# Patient Record
Sex: Female | Born: 1945 | Hispanic: No | Marital: Single | Smoking: Former smoker
Health system: Southern US, Community
[De-identification: ages and names within clinical notes are randomized; demographics above are authoritative.]

## PROBLEM LIST (undated history)

## (undated) DIAGNOSIS — I480 Paroxysmal atrial fibrillation: Secondary | ICD-10-CM

## (undated) DIAGNOSIS — T7840XA Allergy, unspecified, initial encounter: Secondary | ICD-10-CM

## (undated) DIAGNOSIS — I495 Sick sinus syndrome: Secondary | ICD-10-CM

## (undated) DIAGNOSIS — E119 Type 2 diabetes mellitus without complications: Secondary | ICD-10-CM

## (undated) DIAGNOSIS — R001 Bradycardia, unspecified: Secondary | ICD-10-CM

## (undated) HISTORY — DX: Type 2 diabetes mellitus without complications: E11.9

## (undated) HISTORY — DX: Allergy, unspecified, initial encounter: T78.40XA

## (undated) HISTORY — DX: Bradycardia, unspecified: R00.1

## (undated) HISTORY — DX: Paroxysmal atrial fibrillation: I48.0

## (undated) HISTORY — DX: Sick sinus syndrome: I49.5

---

## 2002-11-21 ENCOUNTER — Encounter: Payer: Self-pay | Admitting: Emergency Medicine

## 2002-11-21 ENCOUNTER — Emergency Department (HOSPITAL_COMMUNITY): Admission: EM | Admit: 2002-11-21 | Discharge: 2002-11-22 | Payer: Self-pay | Admitting: Emergency Medicine

## 2013-12-07 ENCOUNTER — Ambulatory Visit: Payer: Medicare Other

## 2013-12-07 ENCOUNTER — Ambulatory Visit (INDEPENDENT_AMBULATORY_CARE_PROVIDER_SITE_OTHER): Payer: Medicare Other | Admitting: Family Medicine

## 2013-12-07 VITALS — BP 122/64 | HR 66 | Temp 98.6°F | Resp 18 | Ht <= 58 in | Wt 116.0 lb

## 2013-12-07 DIAGNOSIS — R079 Chest pain, unspecified: Secondary | ICD-10-CM

## 2013-12-07 DIAGNOSIS — K219 Gastro-esophageal reflux disease without esophagitis: Secondary | ICD-10-CM

## 2013-12-07 DIAGNOSIS — R000 Tachycardia, unspecified: Secondary | ICD-10-CM | POA: Diagnosis not present

## 2013-12-07 LAB — COMPREHENSIVE METABOLIC PANEL
ALT: 19 U/L (ref 0–35)
AST: 20 U/L (ref 0–37)
Albumin: 4.5 g/dL (ref 3.5–5.2)
Alkaline Phosphatase: 99 U/L (ref 39–117)
BUN: 15 mg/dL (ref 6–23)
CO2: 26 mEq/L (ref 19–32)
Calcium: 9.9 mg/dL (ref 8.4–10.5)
Chloride: 102 mEq/L (ref 96–112)
Creat: 0.62 mg/dL (ref 0.50–1.10)
Glucose, Bld: 103 mg/dL — ABNORMAL HIGH (ref 70–99)
Potassium: 4.2 mEq/L (ref 3.5–5.3)
Sodium: 139 mEq/L (ref 135–145)
Total Bilirubin: 0.5 mg/dL (ref 0.3–1.2)
Total Protein: 7.6 g/dL (ref 6.0–8.3)

## 2013-12-07 LAB — POCT UA - MICROSCOPIC ONLY
Casts, Ur, LPF, POC: NEGATIVE
Crystals, Ur, HPF, POC: NEGATIVE
Mucus, UA: NEGATIVE
Yeast, UA: NEGATIVE

## 2013-12-07 LAB — POCT URINALYSIS DIPSTICK
Bilirubin, UA: NEGATIVE
Glucose, UA: NEGATIVE
Ketones, UA: NEGATIVE
Nitrite, UA: NEGATIVE
Protein, UA: 30
Spec Grav, UA: 1.01
Urobilinogen, UA: 0.2
pH, UA: 7

## 2013-12-07 LAB — POCT CBC
Granulocyte percent: 58.6 %G (ref 37–80)
HCT, POC: 43.7 % (ref 37.7–47.9)
Hemoglobin: 13.5 g/dL (ref 12.2–16.2)
Lymph, poc: 3.9 — AB (ref 0.6–3.4)
MCH, POC: 31 pg (ref 27–31.2)
MCHC: 30.9 g/dL — AB (ref 31.8–35.4)
MCV: 100.4 fL — AB (ref 80–97)
MID (cbc): 0.9 (ref 0–0.9)
MPV: 8.9 fL (ref 0–99.8)
POC Granulocyte: 6.8 (ref 2–6.9)
POC LYMPH PERCENT: 33.5 %L (ref 10–50)
POC MID %: 7.9 %M (ref 0–12)
Platelet Count, POC: 322 10*3/uL (ref 142–424)
RBC: 4.35 M/uL (ref 4.04–5.48)
RDW, POC: 13.6 %
WBC: 11.6 10*3/uL — AB (ref 4.6–10.2)

## 2013-12-07 LAB — GLUCOSE, POCT (MANUAL RESULT ENTRY): POC Glucose: 91 mg/dl (ref 70–99)

## 2013-12-07 LAB — TSH: TSH: 0.892 u[IU]/mL (ref 0.350–4.500)

## 2013-12-07 MED ORDER — OMEPRAZOLE 40 MG PO CPDR: 40.0000 mg | DELAYED_RELEASE_CAPSULE | Freq: Every day | ORAL | Status: DC

## 2013-12-07 MED ORDER — ALBUTEROL SULFATE HFA 108 (90 BASE) MCG/ACT IN AERS: 2.0000 | INHALATION_SPRAY | RESPIRATORY_TRACT | Status: DC | PRN

## 2013-12-07 MED ORDER — IPRATROPIUM BROMIDE 0.03 % NA SOLN: 2.0000 | Freq: Four times a day (QID) | NASAL | Status: DC

## 2013-12-07 MED ORDER — AZITHROMYCIN 250 MG PO TABS: ORAL_TABLET | ORAL | Status: DC

## 2013-12-07 MED ORDER — FLUTICASONE PROPIONATE 50 MCG/ACT NA SUSP: 2.0000 | Freq: Every day | NASAL | Status: DC

## 2013-12-07 NOTE — Progress Notes (Addendum)
Subjective:   Patient ID: Courtney [name], female    DOB: September 03, 1946, 67 y.o.   MRN: [id number]  HPI  This chart was scribed for Sherren Mocha, MD, by Ellin Mayhew, ED Scribe. This patient was seen in room 4 and the patient's care was started at 12:30 PM.  Chief Complaint  Patient presents with  . rapid heart beat    x3 weeks   . Fatigue    HPI Comments: Courtney [name] is a 67 y.o. female who presents to the [facility] complaining of intermittent heart palpitations that has been ongoing for three weeks. Her son states that the patient has been feeling associated chest pain which radiates to the back. The patient states the pain usually occurs when she lays down at night and usually lasts approximately 10-30 minutes where she feels her HR alternating rhythms. Additionally, she states experiencing associated hot flashes and chills with her cardiac symptoms. She denies any pain traveling to her abdominal region or experiencing nausea, vomiting, or diarrhea; however, the patient has had an appetite change and has had trouble eating normal amounts of food since the CP began. During the day, she feels fatigued and a general feeling of illness. She has been urinating more frequently, and has experienced indigestion, but has otherwise not experienced any abnormal GU or GI symptoms. The patient does not actively exercise and states she feels SOB when walking vigorously or walking up a flight of stairs. Upon having palpitations, the patient states feeling tightness in her chest, which makes it difficult for her to breathe.She states this period of SOB typically lasts 10-15 minutes and occurs with the same onset as the CP. She has not had cardiac medical problems in the past. She currently takes Advil for the CP and back pain with minimal relief. Patient confirms she is an every day smoker.  Past Medical History  Diagnosis Date  . Allergy     History reviewed. No pertinent past surgical  history.  History reviewed. No pertinent family history.  History   Social History  . Marital Status: Single    Spouse Name: N/A    Number of Children: N/A  . Years of Education: N/A   Occupational History  . Not on file.   Social History Main Topics  . Smoking status: Current Every Day Smoker  . Smokeless tobacco: Not on file  . Alcohol Use: Not on file  . Drug Use: Not on file  . Sexual Activity: Not on file   Other Topics Concern  . Not on file   Social History Narrative  . No narrative on file   No Known Allergies  No current outpatient prescriptions on file prior to visit.   No current facility-administered medications on file prior to visit.    Review of Systems  Constitutional: Positive for chills, diaphoresis, appetite change and fatigue. Negative for fever.  HENT: Negative for congestion, ear pain and sore throat.   Respiratory: Positive for shortness of breath. Negative for cough and chest tightness.   Cardiovascular: Positive for chest pain and palpitations.  Gastrointestinal: Negative for nausea, vomiting, abdominal pain and diarrhea.       Dyspepsia  Endocrine: Positive for polyuria.  Genitourinary: Negative for dysuria and hematuria.  Musculoskeletal: Positive for back pain.  Skin: Negative.   Neurological: Negative for dizziness, weakness and headaches.   BP 122/64  Pulse 66  Temp(Src) 98.6 F (37 C) (Oral)  Resp 18  Ht 4' 5.5" (  1.359 m)  Wt 116 lb (52.617 kg)  BMI 28.49 kg/m2  SpO2 98% Objective:  Physical Exam  Nursing note and vitals reviewed. Constitutional: She is oriented to person, place, and time. She appears well-developed and well-nourished. No distress.  HENT:  Head: Normocephalic and atraumatic.  Eyes: Conjunctivae and EOM are normal.  Neck: Normal range of motion. Neck supple.  Cardiovascular: Normal rate, regular rhythm and normal heart sounds.   No murmur heard. Pulmonary/Chest: Effort normal and breath sounds normal. No  respiratory distress. She has no wheezes.  Musculoskeletal: Normal range of motion.  Neurological: She is alert and oriented to person, place, and time.  Skin: Skin is warm and dry.  Psychiatric: She has a normal mood and affect. Her behavior is normal.   Results for orders placed in visit on 12/07/13  POCT CBC      Result Value Range   WBC 11.6 (*) 4.6 - 10.2 K/uL   Lymph, poc 3.9 (*) 0.6 - 3.4   POC LYMPH PERCENT 33.5  10 - 50 %L   MID (cbc) 0.9  0 - 0.9   POC MID % 7.9  0 - 12 %M   POC Granulocyte 6.8  2 - 6.9   Granulocyte percent 58.6  37 - 80 %G   RBC 4.35  4.04 - 5.48 M/uL   Hemoglobin 13.5  12.2 - 16.2 g/dL   HCT, POC 16.1  09.6 - 47.9 %   MCV 100.4 (*) 80 - 97 fL   MCH, POC 31.0  27 - 31.2 pg   MCHC 30.9 (*) 31.8 - 35.4 g/dL   RDW, POC 04.5     Platelet Count, POC 322  142 - 424 K/uL   MPV 8.9  0 - 99.8 fL  GLUCOSE, POCT (MANUAL RESULT ENTRY)      Result Value Range   POC Glucose 91  70 - 99 mg/dl  POCT URINALYSIS DIPSTICK      Result Value Range   Color, UA yellow     Clarity, UA clear     Glucose, UA neg     Bilirubin, UA neg     Ketones, UA neg     Spec Grav, UA 1.010     Blood, UA trace-lysed     pH, UA 7.0     Protein, UA 30 mg     Urobilinogen, UA 0.2     Nitrite, UA neg     Leukocytes, UA small (1+)    POCT UA - MICROSCOPIC ONLY      Result Value Range   WBC, Ur, HPF, POC 0-5     RBC, urine, microscopic 0-2     Bacteria, U Microscopic trace     Mucus, UA neg     Epithelial cells, urine per micros 1-4     Crystals, Ur, HPF, POC neg     Casts, Ur, LPF, POC neg     Yeast, UA neg     [facility] reading (PRIMARY) by  Dr. Clelia Croft. CXR: increased in bibasilar hilar infiltrates and haziness. CLINICAL DATA: Fatigue, rapid heart rate  EXAM: CHEST 2 VIEW  COMPARISON: None.  FINDINGS: Normal cardiac silhouette and mediastinal contours given slightly reduced lung volumes. Bilateral mid and lower lung heterogeneous slightly nodular opacities. No definite  pleural effusion or pneumothorax. No definite evidence of edema. No acute osseus abnormalities.  IMPRESSION: Bilateral mid and lower lung slightly nodular heterogeneous opacities which in the absence of prior examinations are age indeterminate, though acute on  chronic process (MAC infection) is not excluded. A follow-up chest radiograph in 4 to 6 weeks after treatment is recommended to ensure resolution.  EKG: NSR, no acute ischemic changes, unchanged while pt was symptomatic Assessment & Plan:  12:38 PM-  Rapid heart beat - Plan: EKG 12-Lead, POCT CBC, POCT glucose (manual entry), POCT urinalysis dipstick, POCT UA - Microscopic Only, TSH, Comprehensive metabolic panel, DG Chest 2 View  Chest pain - Plan: Ambulatory referral to Cardiology - unsure of etiology - has cardiac risk factors of no prior medical care and smoking but could be gerd or acute pulmonary infection w/ abnml CXR and mild leukocytosis - start zpack and recheck in 1 wk  GERD (gastroesophageal reflux disease) - Plan: H. pylori antibody, IgG - gave diet info in vietnamese. Start ppi.  Meds ordered this encounter  Medications  . Ibuprofen (ADVIL) 200 MG CAPS    Sig: Take by mouth.  Marland Kitchen omeprazole (PRILOSEC) 40 MG capsule    Sig: Take 1 capsule (40 mg total) by mouth daily.    Dispense:  30 capsule    Refill:  3  . azithromycin (ZITHROMAX) 250 MG tablet    Sig: Take 2 tabs PO x 1 dose, then 1 tab PO QD x 4 days    Dispense:  6 tablet    Refill:  0  . fluticasone (FLONASE) 50 MCG/ACT nasal spray    Sig: Place 2 sprays into both nostrils at bedtime.    Dispense:  16 g    Refill:  2  . ipratropium (ATROVENT) 0.03 % nasal spray    Sig: Place 2 sprays into the nose 4 (four) times daily.    Dispense:  30 mL    Refill:  1  . albuterol (PROVENTIL HFA;VENTOLIN HFA) 108 (90 BASE) MCG/ACT inhaler    Sig: Inhale 2 puffs into the lungs every 4 (four) hours as needed for wheezing or shortness of breath (cough, shortness of  breath or wheezing.).    Dispense:  1 Inhaler    Refill:  1    I personally performed the services described in this documentation, which was scribed in my presence. The recorded information has been reviewed and considered, and addended by me as needed.  Norberto Sorenson, MD MPH

## 2013-12-07 NOTE — Patient Instructions (Signed)
B?nh Tro Ng??c D? Dy Th?c Qu?n, Ng??i L?n (Gastroesophageal Reflux Diseaes, Adult) B?nh tro ng??c d? dy th?c qu?n (GERD) x?y ra khi axit t? d? dy tro ln th?c qu?n. Khi axit ti?p xc v?i th?c qu?n, axit gy ra ?au (vim) trong th?c qu?n. Theo th?i gian, GERD c th? t?o ra cc l? nh? (cc v?t lot) ? nim m?c th?c qu?n.  NGUYN NHN  Tr?ng l??ng c? th? t?ng. ?i?u ny t?o p l?c ln d? dy, lm t?ng axit t? d? dy vo th?c qu?n.  Ht thu?c l. Ht thu?c l lm t?ng s?n sinh axit trong d? dy.  U?ng r??u. ?y l nguyn nhn lm gi?m p l?c trong c? th?t th?c qu?n d??i (van ho?c vng c? gi?a th?c qu?n v d? dy), cho php axit t? d? dy vo th?c qu?n.  ?n t?i mu?n v b?ng no. Tnh tr?ng ny lm t?ng p l?c c?ng nh? t?ng s?n sinh axit trong d? dy.  D? t?t c? th?t th?c qu?n d??i. ?i khi khng tm th?y nguyn nhn. TRI?U CH?NG  ?au rt ? ph?n d??i gi?a ng?c pha sau x??ng ?c v ? khu v?c gi?a d? dy. Hi?n t??ng ny c th? x?y ra hai l?n m?t tu?n ho?c th??ng xuyn h?n.  Kh nu?t.  ?au h?ng.  Ho khan.  Cc tri?u ch?ng gi?ng hen suy?n, bao g?m t?c ng?c,kh th? ho?c th? kh kh. CH?N ?ON Chuyn gia ch?m sc s?c kh?e c th? ch?n ?on GERD d?a trn cc tri?u ch?ng c?a b?n. Trong m?t s? tr??ng h?p, ch?p X quang v cc xt nghi?m khc c th? ???c ti?n hnh ?? ki?m tra cc bi?n ch?ng ho?c tnh tr?ng c?a d? dy v th?c qu?n. ?I?U TR? Chuyn gia ch?m sc s?c kh?e c th? khuy?n ngh? dng thu?c khng c?n k ??n ho?c thu?c c?n k ??n ?? gip gi?m s?n sinh axit. Hy h?i chuyn gia ch?m sc s?c kh?e c?a b?n tr??c khi b?t ??u ho?c dng thm b?t k? lo?i thu?c m?i no. H??NG D?N CH?M SC T?I NH  Thay ??i cc y?u t? m b?n c th? ki?m sot ???c. H?i chuyn gia ch?m sc s?c kh?e ?? ???c h??ng d?n v? vi?c gi?m cn, b? thu?c l v s? d?ng r??u.  Trnh cc lo?i th?c ph?m v ?? u?ng lm cho cc tri?u ch?ng t?i t? h?n, ch?ng h?n nh?:  ?? u?ng c caffeine ho?c r??u.  S c la.  B?c h ho?c v? b?c  h.  T?i v hnh ty.  Th?c ?n cay.  Tri cy h? cam, ch?ng h?n nh? cam, chanh hay chanh ty.  Cc th?c ?n c c chua, ch?ng h?n nh? n??c x?t, ?t, salsa (n??c x?t cay) v bnh pizza.  Cc lo?i th?c ?n chin xo v nhi?u ch?t bo.  Trnh n?m xu?ng ng? 3 ti?ng tr??c gi? ?i ng? ho?c tr??c khi c m?t gi?c ng? ng?n.  ?n nh?ng b?a ?n nh?, th??ng xuyn h?n thay v cc b?a ?n l?n.  M?c qu?n o r?ng. Khng ?eo b?t c? th? g ch?t quanh th?t l?ng gy p l?c ln d? dy.  Nng ??u gi??ng cao ln t? 6 ??n 8 inch b?ng cc kh?i g? ?? gip b?n ng?. S? d?ng thm g?i s? khng c tc d?ng.  Ch? s? d?ng thu?c khng c?n k ??n ho?c thu?c c?n k ??n ?? gi?m ?au, gi?m c?m gic kh ch?u ho?c h? s?t theo ch? d?n c?a chuyn gia ch?m sc s?c kh?e c?a b?n.    Khng dng thu?c atpirin, ibuprofen ho?c cc thu?c ch?ng vim khng c steroid (NSAID) khc. HY NGAY L?P T?C ?I KHM N?U:  B?n b? ?au ? cnh tay, c?, hm, r?ng ho?c l?ng.  Hi?n t??ng ?au t?ng ln ho?c thay ??i theo c??ng ?? ho?c th?i gian.  B?n b? bu?n nn, nn ho?c ?? m? hi (tot m? hi).  B?n b? kh th? ho?c ng?t x?u.  Ch?t nn c mu xanh l cy, vng, ?en ho?c trng gi?ng nh? b c ph ho?c mu.  Phn c mu ??, ?? nh? mu ho?c ?en. Nh?ng tri?u ch?ng ny c th? l d?u hi?u c?a cc v?n ?? khc, ch?ng h?n nh? b?nh tim, ch?y mu d? dy ho?c ch?y mu th?c qu?n. ??M B?O B?N:  Hi?u cc h??ng d?n ny.  S? theo di tnh tr?ng c?a mnh.  S? yu c?u tr? gip ngay l?p t?c n?u b?n c?m th?y khng ?? ho?c tnh tr?ng tr?m tr?ng h?n. Document Released: 09/08/2005 Document Revised: 08/01/2013 [facility] Patient Information 2014 [place], [place]. Cai Thu?c L (Smoking Cessation) B? ht thu?c c  ngh?a quan tr?ng ??i v?i s?c kh?e c?a b?n v c nhi?u l?i ch. Tuy nhin, khng ph?i lun d? dng ?? b? v nicotine l m?t lo?i ch?t gy nghi?n. Thng th??ng, m?i ng??i c? g?ng t? 3 l?n tr? ln tr??c khi c th? b? thu?c l. Ti li?u ny gi?i thch nh?ng cch t?t nh?t  ?? b?n c th? chu?n b? b? ht thu?c. B? thu?c l l vi?c lm kh kh?n v c?n r?t nhi?u n? l?c, nh?ng b?n c th? lm ?i?u ?. ?U ?I?M C?A B? HT THU?C  B?n s? s?ng lu h?n, c?m th?y kh?e h?n v s?ng t?t h?n.  C? th? c?a b?n s? c?m nh?n ???c tc ??ng c?a vi?c b? thu?c g?n nh? ngay l?p t?c.  Trong vng 20 pht, huy?t p s? gi?m. M?ch ??p tr? l?i m?c bnh th??ng.  Sau 8 gi?, n?ng ?? cacbon monoxit trong mu s? tr? v? bnh th??ng. N?ng ?? oxy c?a b?n t?ng.  Sau 24 gi?, nguy c? b? ?au tim b?t ??u gi?m. H?i th?, tc v c? th? b?n s? h?t mi khi thu?c.  Sau 48 gi?, cc ??u dy th?n kinh b? t?n th??ng b?t ??u h?i ph?c. Kh??u gic v v? gic s? c?i [place]?n.  Sau 72 gi?, c? th? h?u nh? khng cn nicotine. ?ng ph? qu?n s? gin ra v th? d? h?n.  Sau 2 ??n 12 tu?n, ph?i c th? gi? khng kh nhi?u h?n. T?p th? d?c tr? nn d? dng h?n v c?i [place]?n tu?n hon.  Nguy c? b? ?au tim, ??t qu?, ung th? ho?c b?nh ph?i gi?m ?ng k?.  Sau 1 n?m, nguy c? b? b?nh tim m?ch vnh s? gi?m m?t n?a.  Sau 5 n?m, nguy c? ??t qu? gi?m xu?ng gi?ng nh? m?t khng ht thu?c.  Sau 10 n?m, nguy c? ung th? ph?i gi?m ?i m?t n?a v nguy c? b? b?nh ung th? khc s? gi?m ?ng k?.  Sau 15 n?m, nguy c? c?a b?nh tim m?ch vnh gi?m, th??ng l b?ng m?c ?? c?a m?t ng??i khng ht thu?c.  N?u b?n ?ang mang thai, b? ht thu?c s? c?i [place]?n c? h?i c m?t em b kh?e m?nh.  Nh?ng ng??i s?ng chung v?i b?n, ??c bi?t l m?i tr? em, s? kh?e m?nh h?n.  B?n s? c thm ti?n ?? chi tiu [place] nh?ng th? khc ngoi thu?c l. CC CU H?I  C?N SUY NGH? TR??C KHI TM CCH B? THU?C L B?n c th? mu?n ni v? cc cu tr? l?i c?a b?n v?i chuyn gia ch?m [place] s?c kh?e.  T?i sao b?n mu?n b? thu?c l?  N?u b?n ? c? g?ng b? thu?c l trong qu kh?, nh?ng g ? gip ch b?n v nh?ng g khng gip ch cho b?n?  ?u l nh?ng tnh hu?ng kh kh?n nh?t ??i v?i b?n sau khi b? thu?c l? B?n ??nh x? l chng nh? th? no?  Ai c th? gip b?n v??t qua nh?ng th?i ?i?m  kh kh?n? Gia ?nh? B?n b? Chuyn gia ch?m [place] s?c kh?e?  B?n vui thch g khi ht thu?c l? B?n v?n c ni?m vui thch no n?u b? thu?c l? D??i ?y l m?t s? cu h?i ?? h?i chuyn gia ch?m [place] s?c kh?e:  Lm th? no b?n c th? gip ti ?? b? thu?c l thnh cng?  Theo b?n, lo?i d??c ph?m no s? l t?t nh?t cho ti v ti nn dng n nh? th? no?  Ti c?n lm g n?u ti c?n thm tr? gip?  Cai thu?c s? c c?m gic nh? th? no? Lm th? no ti c th? nh?n ???c thng tin v? cai thu?c? CHU?N B? S?N SNG  Ch?n ngy b? thu?c l.  Thay ??i mi tr??ng c?a b?n b?ng cch v?t b? t?t c? thu?c l, g?t tn, dim v b?t l?a trong nh, trn xe ho?c n?i lm vi?c. Khng cho php m?i ng??i ht thu?c trong nh b?n.  Xem l?i n? l?c b? thu?c l tr??c ?y c?a b?n. Hy suy ngh? v? nh?ng g hi?u qu? v nh?ng g khng. NH?N H? TR? V KHUY?N [facility] B?n c nhi?u kh? n?ng thnh cng h?n n?u c s? gip ??. B?n c th? nh?n ???c h? tr? b?ng nhi?u cch.  Ni v?i gia ?nh, b?n b v ??ng nghi?p r?ng b?n s? b? thu?c l v c?n s? h? tr? c?a h?. Yu c?u h? khng ht thu?c xung quanh b?n.  Nh?n t? v?n v h? tr? c nhn, nhm ho?c qua ?i?n tho?i. Cc ch??ng trnh ???c cung c?p t?i cc b?nh vi?n v trung tm y t? ??a ph??ng. Hy g?i cho s? y t? ??a ph??ng ?? bi?t thng tin v? cc ch??ng trnh trong khu v?c c?a b?n.  Ni?m tin v th?c hnh tm linh c th? gip m?t s? ng??i ht thu?c b? thu?c l.  T?i v? "quit meter" (??ng h? b? thu?c l) trn my tnh c?a b?n ?? theo di s? li?u th?ng k b? thu?c l, ch?ng h?n nh? b?n ? khng ht thu?c ???c bao lu, s? l??ng thu?c khng ht v ti?n ti?t ki?m ???c.  Nh?n m?t cu?n sch t? gip ?? v? b? ht thu?c v trnh xa thu?c l. H?C CC K? N?NG V HNH VI M?I  T? ?nh l?c h??ng kh?i nhu c?u ht thu?c. Ni chuy?n v?i m?t ai ?, ?i b? ho?c gi?t th?i gian b?ng cng vi?c.  Thay ??i [place] quen bnh th??ng c?a b?n. Thay ??i l? trnh ??n n?i lm vi?c. U?ng tr thay cho c ph. ?n sng t?i m?t  n?i khc.  Gi?m b?t c?ng th?ng. T?m n??c nng, t?p th? d?c ho?c ??c sch.  D? ??nh lm m?t vi?c g ? th v? m?i ngy. T? th??ng cho mnh do khng ht thu?c.  Khm ph cc ch??ng trnh t??ng tc d?a trn web chuyn gip b?n b? thu?c l. MUA  D??C PH?M V S? D?NG D??C PH?M H?P L D??c ph?m c th? gip b?n ng?ng ht thu?c v gi?m c?n thm thu?c l. K?t h?p d??c ph?m v?i cc ph??ng php hnh vi v h? tr? ? trn c th? t?ng ?ng k? kh? n?ng b? thu?c l thnh cng c?a b?n.  Li?u php thay th? nicotine gip cung c?p nicotine cho c? th? c?a b?n m khng c?n tc ??ng tiu c?c v r?i ro c?a vi?c ht thu?c. Li?u php thay th? nicotine bao g?m k?o, vin ng?m, thu?c ht, thu?c x?t m?i v mi?ng dn da ch?a nicotine. M?t s? khng c?n k toa, m?t s? khc c?n c toa c?a bc s?.  Thu?c ch?ng tr?m c?m gip nh?ng ng??i b? ht thu?c, nh?ng khng r n ho?t ??ng nh? th? no. D??c ph?m ny c s?n theo toa.  Thu?c c? ch? v?n m?t ph?n th? th? nicotinic m ph?ng hi?u ?ng c?a nicotin trong no c?a b?n. D??c ph?m ny c s?n theo toa. Hy h?i chuyn gia ch?m [place] s?c kh?e ?? ???c t? v?n v? nh?ng lo?i thu?c ?? s? d?ng v cch s? d?ng chng d?a [place] b?nh s? c?a b?n. Chuyn gia ch?m [place] s?c kh?e s? cho b?n bi?t c?n ??  nh?ng tc d?ng ph? no n?u b?n ch?n s? d?ng lo?i d??c ph?m ho?c li?u php. ??c k? thng tin trn bao b. Khng s? d?ng b?t k? s?n ph?m no khc c ch?a nicotine trong khi s? d?ng m?t s?n ph?m thay th? nicotine. TI PHT HO?C TNH HU?NG KH KH?N H?u h?t ti pht x?y ra trong vng 3 thng ??u tin sau khi b? thu?c. Khng n?n lng khi b?t ??u ht thu?c tr? l?i. Hy nh? r?ng, h?u h?t m?i ng??i c? g?ng nhi?u l?n tr??c b? h?n thu?c. B?n c th? c cc tri?u ch?ng t? b? v c? th? c?a b?n ? quen v?i nicotine. B?n c th? thm thu?c l, b? kch thch, c?m th?y r?t ?i, ho th??ng xuyn, b? ?au ??u, ho?c kh t?p trung. Cc tri?u ch?ng t? b? ch? l t?m th?i. Chng m?nh nh?t khi b?n b?t ??u b? thu?c l, nh?ng chng s? bi?n m?t  trong vng 10-14 ngy. ?? gi?m nguy c? ti pht, c? g?ng:  Trnh u?ng r??u. U?ng r??u lm gi?m c? h?i b? thu?c thnh cng c?a b?n.  Gi?m l??ng caffeine b?n tiu th?. M?t khi b?n b? thu?c l, l??ng caffeine trong c? th? c?a b?n t?ng v c th? t?o cho b?n cc tri?u ch?ng nh? tim ??p nhanh, ?? m? hi v lo l?ng.  Trnh nh?ng ng??i ht thu?c v h? c th? khi?n b?n mu?n ht thu?c.  ??ng ?? vi?c t?ng cn lm b?n m?t t?p trung. Nhi?u ng??i ht thu?c s? t?ng cn khi b? thu?c, th??ng t h?n 10 pao (4,5 kg). ?n m?t ch? ?? ?n u?ng lnh m?nh v duy tr ho?t ??ng. B?n lun c th? gi?m s? cn ? t?ng sau khi b? thu?c l.  Tm cch ?? c?i [place]?n tm tr?ng c?a b?n ngoi vi?c ht thu?c. ?? BI?T THM THNG TIN [email or web address] Document Released: 03/16/2007 Document Revised: 08/01/2013 [facility] Patient Information 2014 [place], [place].

## 2013-12-10 LAB — H. PYLORI ANTIBODY, IGG: H Pylori IgG: 1.91 {ISR} — ABNORMAL HIGH

## 2013-12-14 ENCOUNTER — Encounter: Payer: Self-pay | Admitting: Family Medicine

## 2013-12-14 MED ORDER — AMOXICILL-CLARITHRO-LANSOPRAZ PO MISC: ORAL | Status: DC

## 2015-05-05 ENCOUNTER — Ambulatory Visit (INDEPENDENT_AMBULATORY_CARE_PROVIDER_SITE_OTHER): Payer: Medicare Other | Admitting: Family Medicine

## 2015-05-05 ENCOUNTER — Ambulatory Visit (INDEPENDENT_AMBULATORY_CARE_PROVIDER_SITE_OTHER): Payer: Medicare Other

## 2015-05-05 VITALS — BP 112/38 | HR 57 | Temp 98.3°F | Resp 18 | Ht <= 58 in | Wt 117.2 lb

## 2015-05-05 DIAGNOSIS — R938 Abnormal findings on diagnostic imaging of other specified body structures: Secondary | ICD-10-CM

## 2015-05-05 DIAGNOSIS — R1013 Epigastric pain: Secondary | ICD-10-CM | POA: Diagnosis not present

## 2015-05-05 DIAGNOSIS — G8929 Other chronic pain: Secondary | ICD-10-CM | POA: Diagnosis not present

## 2015-05-05 DIAGNOSIS — R1904 Left lower quadrant abdominal swelling, mass and lump: Secondary | ICD-10-CM

## 2015-05-05 DIAGNOSIS — R9389 Abnormal findings on diagnostic imaging of other specified body structures: Secondary | ICD-10-CM

## 2015-05-05 LAB — POCT URINALYSIS DIPSTICK
Bilirubin, UA: NEGATIVE
Glucose, UA: NEGATIVE
Ketones, UA: NEGATIVE
Leukocytes, UA: NEGATIVE
Nitrite, UA: NEGATIVE
Protein, UA: 30
Spec Grav, UA: 1.025
Urobilinogen, UA: 0.2
pH, UA: 5.5

## 2015-05-05 LAB — POCT CBC
Granulocyte percent: 63 %G (ref 37–80)
HCT, POC: 39.5 % (ref 37.7–47.9)
Hemoglobin: 13.1 g/dL (ref 12.2–16.2)
Lymph, poc: 3.2 (ref 0.6–3.4)
MCH, POC: 30.6 pg (ref 27–31.2)
MCHC: 33.2 g/dL (ref 31.8–35.4)
MCV: 92.2 fL (ref 80–97)
MID (cbc): 0.9 (ref 0–0.9)
MPV: 7.3 fL (ref 0–99.8)
POC Granulocyte: 6.9 (ref 2–6.9)
POC LYMPH PERCENT: 29.1 %L (ref 10–50)
POC MID %: 7.9 %M (ref 0–12)
Platelet Count, POC: 403 10*3/uL (ref 142–424)
RBC: 4.29 M/uL (ref 4.04–5.48)
RDW, POC: 14.7 %
WBC: 10.9 10*3/uL — AB (ref 4.6–10.2)

## 2015-05-05 LAB — POCT UA - MICROSCOPIC ONLY
Bacteria, U Microscopic: NEGATIVE
Casts, Ur, LPF, POC: NEGATIVE
Crystals, Ur, HPF, POC: NEGATIVE
Mucus, UA: NEGATIVE
Yeast, UA: NEGATIVE

## 2015-05-05 MED ORDER — POLYETHYLENE GLYCOL 3350 17 GM/SCOOP PO POWD: 17.0000 g | Freq: Two times a day (BID) | ORAL | Status: DC | PRN

## 2015-05-05 MED ORDER — AMOXICILLIN-POT CLAVULANATE 875-125 MG PO TABS: 1.0000 | ORAL_TABLET | Freq: Two times a day (BID) | ORAL | Status: DC

## 2015-05-05 NOTE — Patient Instructions (Signed)
To bn (Constipation) To bn l khi m?t ng??i ?i ??i ti?n t h?n ba l?n trong m?t [place]ardin?n, ??i ti?n kh kh?n, ho?c ??i ti?n ra phn kh, c?ng, ho?c to h?n bnh th??ng. Khi [place]?i cng cao th cng d? b? to bn. N?u qu v? c? g?ng ch?a to bn b?ng cc lo?i thu?c gip qu v? ??i ti?n ???c (thu?c nhu?n trng), b?nh c th? n?ng thm. S? d?ng thu?c nhu?n trng trong th?i gian di c th? lm cho cc c? c?a ru?t gi y?u ?i. Ch? ?? ?n [place]?u ch?t x?, khng u?ng ?? n??c, v dng m?t s? lo?i thu?c nh?t ??nh c th? lm to bo n?ng thm.  NGUYN NHN.   M?t s? lo?i thu?c nh?t ??nh nh? thu?c ch?ng tr?m c?m, thu?c gi?m ?au, thu?c c b? sung s?t, thu?c trung ha axit d?ch v?, v thu?c l?i ti?u.  M?t s? b?nh l nh?t ??nh nh? ti?u ???ng, h?i ch?ng ru?t kch thch (IBS), b?nh c?a tuy?n gip, ho?c tr?m c?m.  Khng u?ng ?? n??c.  Khng ?n ?? th?c ?n giu ch?t x?.  C?ng th?ng ho?c do ?i l?i.  t ho?t ??ng thn th? ho?c th? d?c.  Nh?n ?i ??i ti?n.  S? d?ng qu nhi?u thu?c nhu?n trng. D?U HI?U V TRI?U CH?NG   ?i ??i ti?n t h?n ba l?n m?i [place]?n.  Ph?i r?n m?nh ?? ??i ti?n.  ??i ti?n ra phn c?ng, kh, ho?c to h?n bnh th??ng.  C?m th?y ??y b?ng ho?c ch??ng b?ng.  ?au ? vng b?ng d??i.  Khng c?m th?y tho?i mi sau khi ??i ti?n. CH?N ?ON  Chuyn gia ch?m [place] s?c kh?e c?a qu v? s? h?i v? b?nh s? v khm th?c th? cho qu v?. C th? c?n ki?m tra thm trong tr??ng h?p to bn n?ng. M?t s? ki?m tra c th? bao g?m:  Ch?p X quang c ch?t c?n quang ?? ki?m tra tr?c trng, ??i trng, v ?i khi c? ru?t non c?a qu v?.  N?i soi tr?c trng sigma ?? ki?m tra ph?n pha d??i c?a ??i trng.  Th? thu?t soi ??i trng ?? khm ton b? ??i trng. ?I?U TR?  ?i?u tr? ty thu?c [place] m?c ?? tr?m tr?ng c?a ch?ng to bn v nguyn nhn gy to bn. ?i?u tr? thng qua ch? ?? ?n u?ng bao g?m u?ng nhi?u n??c h?n v ?n th?c ?n c nhi?u ch?t x? h?n. ?i?u tr? thng qua l?i s?ng c th? bao g?m vi?c t?p th? d?c th??ng xuyn. N?u  nh?ng khuy?n ngh? v? ch? ?? ?n v l?i s?ng khng c tc d?ng, chuyn gia ch?m [place] s?c kh?e c th? khuy?n ngh? qu v? dng cc lo?i thu?c nhu?n trng khng c?n k ??n ?? gip qu v? ??i ti?n. C th? ph?i k ??n thu?c c?n k ??n n?u thu?c khng c?n k ??n khng c tc d?ng.  H??NG D?N CH?M [place] T?I NH   ?n th?c ?n c nhi?u ch?t x? nh? tri cy, rau, ng? c?c nguyn h?t, v cc lo?i ??u.  H?n ch? th?c ?n c nhi?u ch?t bo v ???ng ch? bi?n s?n, ch?ng h?n khoai ty chin, bnh hamburger, bnh quy, k?o, v soda.  C th? dng th?c ph?m ch?c n?ng c b? sung ch?t x? [place] kh?u ph?n ?n c?a qu v? n?u qu v? khng th? dng ?? ch?t x? t? th?c ?n.  U?ng ?? n??c ?? gi? cho n??c ti?u trong ho?c vng nh?t.  T?p th? d?c th??ng xuyn ho?c  theo ch? d?n c?a chuyn gia ch?m [place] s?c kh?e.  [place] nh v? sinh ngay khi qu v? c nhu c?u. Khng nh?n ?i ??i ti?n.  Ch? s? d?ng thu?c khng c?n k ??n ho?c thu?c c?n k ??n theo ch? d?n c?a chuyn gia ch?m [place] s?c kh?e.Khng dng cc lo?i thu?c ch?ng to bn khc m khng bn b?c tr??c v?i chuyn gia ch?m [place] s?c kh?e. NGAY L?P T?C ?I KHM N?U:   ?i ??i ti?n ra mu ?? t??i.  Ch?ng to bn ko di h?n 4 ngy v tr?m tr?ng h?n.  Qu v? b? ?au b?ng ho?c ?au tr?c trng.  Phn c?a qu v? m?ng, trng nh? bt ch.  Qu v? b? s?t cn khng r nguyn nhn. ??M B?O QU V?:   Hi?u r cc h??ng d?n ny.  S? theo di tnh tr?ng c?a mnh.  S? yu c?u tr? gip ngay l?p t?c n?u qu v? c?m th?y khng kh?e ho?c th?y tr?m tr?ng h?n. Document Released: 03/16/2011 Document Revised: 12/04/2013 [facility] Patient Information 2015 [place], [place]. This information is not intended to replace advice given to you by your health care provider. Make sure you discuss any questions you have with your health care provider. Vim ti th?a (Diverticulitis) Vim ti th?a l b? vim ho?c nhi?m trng cc ti nh? hinh thnh trong ru?t gi khi qu v? b? m?t b?nh g?i l b?nh ti th?a ??i trng. Cc ti  trong ru?t gi ???c g?i l ti th?a. Ru?t gi, hay ??i trng, l n?i h?p th? n??c v hnh thnh phn. Bi?n ch?ng c?a vim ti th?a c th? bao g?m:  Ch?y mu.  Nhi?m trng n?ng.  ?au r?t nhi?u.  Th?ng ru?t gi.  T?c ru?t gi. NGUYN NHN  Vim ti th?a do vi khu?n gy ra. Vim ti th?a x?y ra khi phn b? m?c [place] cc ti th?a. Tnh tr?ng ny cho php vi khu?n sinh si trong ti th?a v c th? d?n t?i vim v nhi?m trng. CC Y?U T? NGUY C? Nh?ng ng??i b? b?nh ti th?a tr?c trng c nguy c? b? vim ti th?a. Ch? ?? ?n khng c ?? ch?t x? c?a rau qu? c th? lm ch?ng vim ti th?a d? pht tri?n h?n. TRI?U CH?NG  Tri?u ch?ng vim ti th?a c th? bao g?m:  ?au v nh?y c?m ?au ? b?ng. C?n ?au th??ng x?y ra ? pha bn tri b?ng, nh?ng c th? x?y ra ? c? nh?ng vng khc.  S?t v ?n l?nh.  ??y h?i.  Kh ch?u.  Bu?n nn.  Nn.  To bn.  Tiu ch?y.  C mu trong phn. CH?N ?ON  Chuyn gia ch?m [place] s?c kh?e c?a qu v? s? khm th?c th? v h?i v? b?nh s? c?a qu v?. Qu v? c th? c?n lm cc xt nghi?m v nhi?u tnh tr?ng b?nh l c th? gy ra cc tri?u ch?ng t??ng t? nh? vim ti th?a. Cc xt nghi?m c th? bao g?m:  Xt nghi?m mu.  Xt nghi?m n??c ti?u.  Th?m khm b?ng hnh ?nh ? b?ng, bao g?m ch?p X quang v ch?p CT. Khi tnh tr?ng c?a qu v? ???c ki?m sot, chuyn gia ch?m [place] s?c kh?e c th? khuy?n ngh? qu v? lm th? thu?t soi ??i trng. Th? thu?t soi ??i trng c th? cho bi?t cc ti th?a b? n?ng nh? th? no v li?u c cn b?nh g khc gy ra cc tri?u ch?ng c?a qu v? hay khng. ?I?U TR?  H?u h?t cc tr??ng h?p vim ti th?a ??u nh? v c th? ?i?u tr? ? nh. ?i?u tr? c th? bao g?m:  Dng thu?c gi?m ?au khng c?n k ??n.  Th?c hi?n ch? ?? ?n ch?t l?ng trong.  U?ng thu?c khng sinh trong 7 - 10 ngy. Nh?ng tr??ng h?p n?ng h?n c th? ph?i ?i?u tr? ? b?nh vi?n. ?i?u tr? c th? bao g?m:  Khng ?n ho?c u?ng.  Dng thu?c gi?m ?au theo k ??n.  Dng thu?c khng sinh  qua ?ng truy?n t?nh m?ch.  Nh?n ch?t l?ng v ch?t dinh d??ng qua m?t ?ng truy?n t?nh m?ch.  Ph?u thu?t. H??NG D?N CH?M [place] T?I NH   Tun th? nghim ng?t theo ch? ??n c?a chuyn gia ch?m [place] s?c kh?e.  Th?c hi?n ch? ?? ?n ton ch?t l?ng ho?c ch? ?? ?n king khc theo ch? d?n c?a chuyn gia ch?m [place] s?c kh?e. Sau khi cc tri?u ch?ng ti?n tri?n, chuyn gia ch?m [place] s?c kh?e c th? cho qu v? thay ??i ch? ?? ?n. Chuyn gia ? c th? khuy?n ngh? qu v? ?n nhi?u ch?t x?. Tri cy v rau l nh?ng ngu?n cung c?p ch?t x? t?t. Ch?t x? lm phn ???c t?ng ra d? h?n.  S? d?ng th?c ph?m b? sung ch?t x? ho?c probiotics theo ch? d?n c?a chuyn gia ch?m [place] s?c kh?e.  Ch? s? d?ng thu?c theo ch? d?n c?a chuyn gia ch?m [place] s?c kh?e.  Tun th? t?t c? cc cu?c h?n ti?p theo c?a qu v?. ?I KHM N?U:   C?n ?au c?a qu v? khng ??.  Qu v? kh ?n.  V?n ?? ??i ti?n c?a qu v? khng tr? l?i bnh th??ng. NGAY L?P T?C ?I KHM N?U:   C?n ?au c?a qu v? tr? nn tr?m tr?ng h?n.  Cc tri?u ch?ng c?a qu v? khng ?? h?n.  Tri?u ch?ng c?a qu v? ??t nhin n?ng h?n.  Qu v? b? s?t.  Qu v? lin t?c b? nn m?a.  Phn c?a qu v? c mu ho?c c mu ?en nh? h?c n. ??M B?O QU V?:   Hi?u r cc h??ng d?n ny.  S? theo di tnh tr?ng c?a mnh.  S? yu c?u tr? gip ngay l?p t?c n?u qu v? c?m th?y khng kh?e ho?c th?y tr?m tr?ng h?n. Document Released: 09/08/2005 Document Revised: 12/04/2013 [facility] Patient Information 2015 [place], [place]. This information is not intended to replace advice given to you by your health care provider. Make sure you discuss any questions you have with your health care provider.

## 2015-05-05 NOTE — Progress Notes (Addendum)
Subjective:  This chart was scribed for Elvina SidleKurt Lauenstein, MD by Stann Oresung-Kai Tsai, Medical Scribe. This patient was seen in room 14 and the patient's care was started 10:20 AM.     Patient ID: Courtney [name], female    DOB: 07/06/1946, 69 y.o.   MRN: [id number]  HPI Courtney [name] is a 69 y.o. female [facility] gradual onset breathing issues that started a week ago. Pt notes chest pain when laying supine and dysuria. She reports heavy breathing and chest pain during night time. She also mention taking medication for it but to no relief. She denies of coughs and fever. Her husband translated for her from [place].   She is retired.    Review of Systems  Constitutional: Negative for fever.  Respiratory: Negative for cough.   Cardiovascular: Positive for chest pain.  Genitourinary: Positive for dysuria.       Objective:   Physical Exam  Abdominal:  Tenderness in LLQ Mild tenderness in epigastrium; no masses, and no HSM   ENT: Dental caries and poor shape, otherwise no acute problem Neck: Supple no adenopathy Chest: Clear Heart: Regular no murmur Abdomen: As noted above patient has tenderness in her left lower quadrant. Her abdomen soft with no HSM Skin: No rash Extremities: Good pulses and no edema.  Results for orders placed or performed in visit on 05/05/15  POCT CBC  Result Value Ref Range   WBC 10.9 (A) 4.6 - 10.2 K/uL   Lymph, poc 3.2 0.6 - 3.4   POC LYMPH PERCENT 29.1 10 - 50 %L   MID (cbc) 0.9 0 - 0.9   POC MID % 7.9 0 - 12 %M   POC Granulocyte 6.9 2 - 6.9   Granulocyte percent 63.0 37 - 80 %G   RBC 4.29 4.04 - 5.48 M/uL   Hemoglobin 13.1 12.2 - 16.2 g/dL   HCT, POC 40.939.5 81.137.7 - 47.9 %   MCV 92.2 80 - 97 fL   MCH, POC 30.6 27 - 31.2 pg   MCHC 33.2 31.8 - 35.4 g/dL   RDW, POC 91.414.7 %   Platelet Count, POC 403 142 - 424 K/uL   MPV 7.3 0 - 99.8 fL  POCT UA - Microscopic Only  Result Value Ref Range   WBC, Ur, HPF, POC 1-3    RBC, urine,  microscopic 0-1    Bacteria, U Microscopic neg    Mucus, UA neg    Epithelial cells, urine per micros 0-2    Crystals, Ur, HPF, POC neg    Casts, Ur, LPF, POC neg    Yeast, UA neg   POCT urinalysis dipstick  Result Value Ref Range   Color, UA yellow    Clarity, UA clear    Glucose, UA neg    Bilirubin, UA neg    Ketones, UA neg    Spec Grav, UA 1.025    Blood, UA small    pH, UA 5.5    Protein, UA 30    Urobilinogen, UA 0.2    Nitrite, UA neg    Leukocytes, UA Negative    [facility] reading (PRIMARY) by  Dr. Milus GlazierLauenstein:  acute abdominal series-chest shows hazy left lower lobe. There is a large stool burden.       Assessment & Plan:   This chart was scribed in my presence and reviewed by me personally.    ICD-9-CM ICD-10-CM   1. LLQ abdominal mass 789.34 R19.04 POCT CBC  POCT UA - Microscopic Only     POCT urinalysis dipstick     DG Abd Acute W/Chest     amoxicillin-clavulanate (AUGMENTIN) 875-125 MG per tablet  2. Abdominal pain, chronic, epigastric 789.06 R10.13 POCT CBC   338.29 G89.29 POCT UA - Microscopic Only     POCT urinalysis dipstick     DG Abd Acute W/Chest     amoxicillin-clavulanate (AUGMENTIN) 875-125 MG per tablet     polyethylene glycol powder (GLYCOLAX/MIRALAX) powder  3. Abnormal chest x-ray 793.2 R93.8 amoxicillin-clavulanate (AUGMENTIN) 875-125 MG per tablet     Signed, Elvina Sidle, MD

## 2016-01-18 ENCOUNTER — Ambulatory Visit (INDEPENDENT_AMBULATORY_CARE_PROVIDER_SITE_OTHER): Payer: Medicare HMO

## 2016-01-18 ENCOUNTER — Ambulatory Visit (INDEPENDENT_AMBULATORY_CARE_PROVIDER_SITE_OTHER): Payer: Medicare HMO | Admitting: Family Medicine

## 2016-01-18 VITALS — BP 142/86 | HR 74 | Temp 98.4°F | Resp 18 | Ht <= 58 in | Wt 114.0 lb

## 2016-01-18 DIAGNOSIS — E1165 Type 2 diabetes mellitus with hyperglycemia: Secondary | ICD-10-CM

## 2016-01-18 DIAGNOSIS — R1904 Left lower quadrant abdominal swelling, mass and lump: Secondary | ICD-10-CM | POA: Diagnosis not present

## 2016-01-18 DIAGNOSIS — R69 Illness, unspecified: Secondary | ICD-10-CM | POA: Diagnosis not present

## 2016-01-18 DIAGNOSIS — R1013 Epigastric pain: Secondary | ICD-10-CM

## 2016-01-18 DIAGNOSIS — R7309 Other abnormal glucose: Secondary | ICD-10-CM

## 2016-01-18 DIAGNOSIS — E118 Type 2 diabetes mellitus with unspecified complications: Secondary | ICD-10-CM | POA: Insufficient documentation

## 2016-01-18 DIAGNOSIS — J441 Chronic obstructive pulmonary disease with (acute) exacerbation: Secondary | ICD-10-CM | POA: Diagnosis not present

## 2016-01-18 DIAGNOSIS — R0602 Shortness of breath: Secondary | ICD-10-CM | POA: Diagnosis not present

## 2016-01-18 DIAGNOSIS — E663 Overweight: Secondary | ICD-10-CM | POA: Diagnosis not present

## 2016-01-18 DIAGNOSIS — R938 Abnormal findings on diagnostic imaging of other specified body structures: Secondary | ICD-10-CM

## 2016-01-18 DIAGNOSIS — IMO0002 Reserved for concepts with insufficient information to code with codable children: Secondary | ICD-10-CM | POA: Insufficient documentation

## 2016-01-18 DIAGNOSIS — J449 Chronic obstructive pulmonary disease, unspecified: Secondary | ICD-10-CM

## 2016-01-18 DIAGNOSIS — G8929 Other chronic pain: Secondary | ICD-10-CM

## 2016-01-18 DIAGNOSIS — R9389 Abnormal findings on diagnostic imaging of other specified body structures: Secondary | ICD-10-CM

## 2016-01-18 DIAGNOSIS — F17200 Nicotine dependence, unspecified, uncomplicated: Secondary | ICD-10-CM | POA: Diagnosis not present

## 2016-01-18 LAB — POCT CBC
Granulocyte percent: 70.2 %G (ref 37–80)
HCT, POC: 39.1 % (ref 37.7–47.9)
Hemoglobin: 13.7 g/dL (ref 12.2–16.2)
Lymph, poc: 2.4 (ref 0.6–3.4)
MCH, POC: 32 pg — AB (ref 27–31.2)
MCHC: 35 g/dL (ref 31.8–35.4)
MCV: 91.3 fL (ref 80–97)
MID (cbc): 0.8 (ref 0–0.9)
MPV: 7.6 fL (ref 0–99.8)
POC Granulocyte: 7.6 — AB (ref 2–6.9)
POC LYMPH PERCENT: 22.5 %L (ref 10–50)
POC MID %: 7.3 %M (ref 0–12)
Platelet Count, POC: 309 10*3/uL (ref 142–424)
RBC: 4.28 M/uL (ref 4.04–5.48)
RDW, POC: 13.5 %
WBC: 10.8 10*3/uL — AB (ref 4.6–10.2)

## 2016-01-18 LAB — POCT GLYCOSYLATED HEMOGLOBIN (HGB A1C): Hemoglobin A1C: 8.2

## 2016-01-18 LAB — GLUCOSE, POCT (MANUAL RESULT ENTRY): POC Glucose: 143 mg/dl — AB (ref 70–99)

## 2016-01-18 MED ORDER — IPRATROPIUM BROMIDE 0.02 % IN SOLN
0.5000 mg | Freq: Once | RESPIRATORY_TRACT | Status: AC
  Administered 2016-01-18: 0.5 mg via RESPIRATORY_TRACT

## 2016-01-18 MED ORDER — METHYLPREDNISOLONE SODIUM SUCC 125 MG IJ SOLR
125.0000 mg | Freq: Once | INTRAMUSCULAR | Status: AC
  Administered 2016-01-18: 125 mg via INTRAMUSCULAR

## 2016-01-18 MED ORDER — ALBUTEROL SULFATE HFA 108 (90 BASE) MCG/ACT IN AERS: 2.0000 | INHALATION_SPRAY | RESPIRATORY_TRACT | Status: DC | PRN

## 2016-01-18 MED ORDER — AMOXICILLIN-POT CLAVULANATE 875-125 MG PO TABS: 1.0000 | ORAL_TABLET | Freq: Two times a day (BID) | ORAL | Status: DC

## 2016-01-18 MED ORDER — CEFTRIAXONE SODIUM 1 G IJ SOLR
1.0000 g | Freq: Once | INTRAMUSCULAR | Status: AC
  Administered 2016-01-18: 1 g via INTRAMUSCULAR

## 2016-01-18 MED ORDER — PREDNISONE 20 MG PO TABS: 40.0000 mg | ORAL_TABLET | Freq: Every day | ORAL | Status: DC

## 2016-01-18 MED ORDER — ALBUTEROL SULFATE (2.5 MG/3ML) 0.083% IN NEBU
2.5000 mg | INHALATION_SOLUTION | Freq: Once | RESPIRATORY_TRACT | Status: AC
  Administered 2016-01-18: 2.5 mg via RESPIRATORY_TRACT

## 2016-01-18 NOTE — Patient Instructions (Addendum)
Because you received an x-ray today, you will receive an invoice from [facility]. Please contact [facility] at [phone] with questions or concerns regarding your invoice. Our billing staff will not be able to assist you with those questions.  Chronic Obstructive Pulmonary Disease Exacerbation Chronic obstructive pulmonary disease (COPD) is a common lung problem. In COPD, the flow of air from the lungs is limited. COPD exacerbations are times that breathing gets worse and you need extra treatment. Without treatment they can be life threatening. If they happen often, your lungs can become more damaged. If your COPD gets worse, your doctor may treat you with:  Medicines.  Oxygen.  Different ways to clear your airway, such as using a mask. HOME CARE  Do not smoke.  Avoid tobacco smoke and other things that bother your lungs.  If given, take your antibiotic medicine as told. Finish the medicine even if you start to feel better.  Only take medicines as told by your doctor.  Drink enough fluids to keep your pee (urine) clear or pale yellow (unless your doctor has told you not to).  Use a cool mist machine (vaporizer).  If you use oxygen or a machine that turns liquid medicine into a mist (nebulizer), continue to use them as told.  Keep up with shots (vaccinations) as told by your doctor.  Exercise regularly.  Eat healthy foods.  Keep all doctor visits as told. GET HELP RIGHT AWAY IF:  You are very short of breath and it gets worse.  You have trouble talking.  You have bad chest pain.  You have blood in your spit (sputum).  You have a fever.  You keep throwing up (vomiting).  You feel weak, or you pass out (faint).  You feel confused.  You keep getting worse. MAKE SURE YOU:  Understand these instructions.  Will watch your condition.  Will get help right away if you are not doing well or get worse.   This information is not intended to replace  advice given to you by your health care provider. Make sure you discuss any questions you have with your health care provider.   Document Released: 11/18/2011 Document Revised: 12/20/2014 Document Reviewed: 08/03/2013 Elsevier Interactive Patient Education 2016 Elsevier Inc.   B?nh ph?i t?c ngh?n m?n tnh (Chronic Obstructive Pulmonary Disease) B?nh ph?i t?c ngh?n m?n tnh (COPD) l tnh tr?ng b?nh l ? ph?i ph? bi?n, trong ?, lu?ng kh t? ph?i ra b? h?n ch?. COPD l m?t thu?t ng? chung c th? s? d?ng ?? m t? nhi?u v?n ?? khc nhau ? ph?i lm h?n ch? lu?ng kh, k? c? vim ph? qu?n m?n tnh v b?nh kh ph? th?ng. N?u qu v? b? COPD, ch?c n?ng ph?i c?a qu v? c th? s? khng bao gi? tr? l?i bnh th??ng, nh?ng c nh?ng bi?n php qu v? c th? th?c hi?n ?? c?i Pollie?n ch?c n?ng ph?i v lm qu v? c?m th?y kh?e h?n. NGUYN NHN   Ht thu?c (ph? bi?n).  Ti?p xc v?i khi thu?c l th? ??ng.  Nh?ng v?n ?? v? di truy?n.  B?nh vim ph?i m?n tnh ho?c nhi?m trng l?p ?i l?p l?i. TRI?U CH?NG  Kh th?, ??c bi?t khi tham gia [place] ho?t ??ng th? ch?t.  Ho su, ko di (m?n tnh) v?i m?t l??ng l?n d?ch nh?y ??c.  Th? kh kh.  Th? nhanh (nh?p th? nhanh).  Da ??i mu xm ho?c h?i xanh (xanh tm), ??c bi?t ? cc ngn tay, ngn chn  ho?c mi qu v?.  M?t m?i.  S?t cn.  Nhi?m trng th??ng xuyn ho?c cc giai ?o?n m cc tri?u ch?ng v? th? tr? nn t?i t? h?n (cc c?n k?ch pht).  T?c ng?c. CH?N ?ON Chuyn gia ch?m [place] s?c kh?e c?a qu v? s? khai thc b?nh s? v ti?n hnh khm th?c th? ?? ch?n ?on COPD. Cc ki?m tra b? sung v? COPD c th? bao g?m:  Ki?m tra ch?c n?ng ph?i (ph?i).  Ch?p X quang ng?c.  Ch?p CT.  Xt nghi?m mu. ?I?U TR?  ?i?u tr? COPD c th? bao g?m:  Thu?c dng qua ?ng ht ho?c my kh dung. Nh?ng lo?i thu?c ny gip x? l cc tri?u ch?ng c?a COPD v lm qu v? th? d? dng h?n.  B? sung thm  xy. B? sung thm  xy ch? c tc d?ng n?u qu v? c n?ng ?? -xy trong mu  th?p.  T?p th? d?c v ho?t ??ng th? ch?t. Nh?ng bi?n php ny h?u ch g?n nh? v?i t?t c? nh?ng ng??i b? COPD.  Ph?u thu?t ho?c c?y ghp ph?i.  Li?u php dinh d??ng ?? t?ng cn, n?u qu v? nh? cn.  Ph?c h?i ch?c n?ng ph?i. ?i?u ny c th? lin quan ??n vi?c lm vi?c v?i m?t nhm chuyn gia ch?m [place] s?c kh?e v cc chuyn gia, ch?ng h?n nh? cc chuyn gia tr? li?u h h?p, ngh? nghi?p v v?t l tr? li?u. H??NG D?N CH?M [place] T?I NH  S? d?ng t?t c? cc lo?i thu?c (d?ng ht ho?c d?ng vin) theo ch? d?n c?a chuyn gia ch?m [place] s?c kh?e.  [place] s? d?ng thu?c ho?c xi-r ho khng c?n k ??n lm kh ???ng h h?p c?a qu v? (ch?ng h?n thu?c khng histamin) v lm ch?m qu trnh lo?i b? cc ch?t ti?t, tr? khi chuyn gia ch?m [place] s?c kh?e h??ng d?n khc.  N?u qu v? l ng??i ht thu?c, ?i?u quan tr?ng nh?t m qu v? c th? lm l b? ht thu?c. Ti?p t?c ht thu?c s? lm t?n th??ng ph?i thm v gy ra cc v?n ?? v? h h?p. ?? ngh? chuyn gia ch?m [place] s?c kh?e gip ?? ?? b? ht thu?c. Chuyn gia ch?m [place] s?c kh?e ? c th? h??ng d?n qu v? s? d?ng cc ngu?n l?c trong c?ng ??ng ho?c cc b?nh vi?n c h? tr? cai thu?c.  Trnh ti?p xc v?i cc ch?t kch thch, ch?ng h?n khi thu?c, ha ch?t v khi lm tr?m tr?ng thm v?n ?? th? c?a qu v?.  S? d?ng li?u php -xy v h?i ph?c ch?c n?ng h h?p n?u ???c chuyn gia ch?m [place] s?c kh?e ch? d?n. N?u qu v? c?n li?u php -xy t?i nh, hy h?i chuyn gia ch?m  s?c kh?e xem li?u qu v? c c?n mua my ?o ?? bo ha -xy trong mu ?? ?o n?ng ?? -xy ? nh hay khng.  Trnh ti?p xc v?i nh?ng ng??i c b?nh truy?n nhi?m.  [place] thay ??i nhi?t ?? v ?? ?m qu m?c.  ?n th?c ?n c l?i cho s?c kh?e. ?n nhi?u b?a nh? h?n, th??ng xuyn h?n v ngh? ng?i tr??c khi ?n c th? gip qu v? duy tr s?c b?n c?a mnh.  Ti?p t?c ho?t ??ng tch c?c, nh?ng hy cn b?ng ho?t ??ng v?i th?i gian ngh? ng?i. T?p th? d?c v ho?t ??ng th? ch?t s? gip qu v? duy tr kh? n?ng lm nh?ng ?i?u qu  v? mu?n lm.  [place]  nhi?m trng v n?m vi?n l vi?c r?t quan tr?ng khi qu v? b? COPD. B?o ??m vi?c s? d?ng t?t c? cc lo?i v?c-xin m chuyn gia ch?m [place] s?c kh?e khuy?n ngh?, ??c bi?t l v?c-xin ph? c?u khu?n v v?c xin cm. Hy h?i chuyn gia ch?m [place] s?c kh?e xem qu v? c c?n dng v?c-xin vim ph?i hay khng.  Tm hi?u v s? d?ng cc k? thu?t th? gin ?? x? l c?ng th?ng.  Tm hi?u v s? d?ng cc k? thu?t th? c ki?m sot theo ch? d?n c?a chuyn gia ch?m [place] s?c kh?e. K? thu?t th? c ki?m sot bao g?m:  Th? mm mi. B?t ??u v?i vi?c ht [place] (ht) b?ng m?i trong 1 giy. Sau ?, mm mi nh? th? qu v? s? hut so v th? ra (th?)  Th? b?ng c? honh. B?t ??u b?ng cch ??t m?t tay ln b?ng, ngay trn th?t l?ng c?a qu v?. Ht [place] th?t ch?m b?ng m?i. Bn tay trn b?ng ph?i chuy?n ??ng ra ngoi. Sau ? mm mi v th? ra th?t ch?m. Qu v? s? c th? c?m nh?n ???c bn tay trn b?ng chuy?n ??ng [place] trong khi qu v? th? ra.  Tm hi?u v s? d?ng k? thu?t ho c ki?m sot ?? ??y d?ch nh?y ra kh?i ph?i. Ho c ki?m sot l m?t lo?t cc l?n ho ng?n, t?ng d?n. Cc b??c c?a k? thu?t ho c ki?m sot l:  H?i nghing ??u v? pha tr??c.  Th? th?t su b?ng cch s? d?ng th? b?ng c? honh.  C? g?ng nn th? trong 3 giy.  Lun ?? mi?ng h?i h trong khi ho hai l?n.  Nh? b?t c? d?ch nh?y no Latin kh?n gi?y.  Ngh? ng?i v l?p l?i cc b??c ny m?t l?n ho?c hai l?n khi c?n [place]?t. ?I KHM N?U:  Qu v? b? ho ra nhi?u d?ch nh?y h?n bnh th??ng.  C s? thay ??i v? mu s?c ho?c ?? ??c c?a d?ch nh?y.  Qu v? th? n?ng nh?c h?n bnh th??ng.  Qu v? th? nhanh h?n bnh th??ng. NGAY L?P T?C ?I KHM N?U:  Qu v? b? kh th? trong khi ngh? ng?i.  Qu v? b? kh th? khi?n qu v? khng th?:  Ni chuy?n.  Th?c hi?n cc ho?t ??ng th? ch?t bnh th??ng.  Qu v? b? ?au ng?c ko di h?n 5 pht.  Mu da c?a qu v? xanh tm h?n bnh th??ng.  Qu v? ?o ?? bo ha x-xy th?p trong h?n 5 pht b?ng m?t my ?o ?? bo ha -xy  trong mu. ??M B?O QU V?:  Hi?u r cc h??ng d?n ny.  S? theo di tnh tr?ng c?a mnh.  S? yu c?u tr? gip ngay l?p t?c n?u qu v? c?m th?y khng kh?e ho?c th?y tr?m tr?ng h?n.   Thng tin ny khng nh?m m?c ?ch thay th? cho l?i khuyn m chuyn gia ch?m [place] s?c kh?e ni v?i qu v?. Hy b?o ??m qu v? ph?i th?o lu?n b?t k? v?n ?? g m qu v? c v?i chuyn gia ch?m  s?c kh?e c?a qu v?.   Document Released: 09/08/2005 Document Revised: 04/15/2015 Elsevier Interactive Patient Education 2016 Elsevier Inc. B?nh ti?u ???ng tp 2, Ng??i l?n (Type 2 Diabetes Mellitus, Adult) B?nh ti?u ???ng tp 2, th??ng g?i ??n gi?n l ti?u ???ng tp 2, l m?t b?nh ko di (m?n tnh). Trong ti?u ???ng tp 2, tuy?n t?y khng s?n xu?t ?? insulin (hocmon),  cc t? bo t ?p ?ng v?i insulin lm cho ( khng insulin), ho?c c? hai. Thng th??ng, insulin v?n chuy?n ???ng t? th?c ?n [place] cc t? bo ? m. Cc t? bo ? m s? d?ng ???ng ?? s?n sinh ra n?ng l??ng. Malie?u h?t insulin ho?c khng ?p ?ng bnh th??ng v?i insulin gy ra l??ng ???ng d? th?a tch t? trong mu thay v ?i [place] cc t? bo ? m. K?t qu? l, lm cho l??ng ???ng trong mu cao (t?ng ???ng huy?t). ?nh h??ng c?a hm l??ng ???ng (glucose) cao c th? gy ra nhi?u bi?n ch?ng.  B?nh ti?u ???ng tp 2 tr??c ?y cn ???c g?i l b?nh ti?u ???ng kh?i pht ? ng??i l?n, nh?ng n c th? x?y ra ? b?t c? l?a tu?i no.  CC Y?U T? NGUY C?  M?t ng??i d? b? b?nh ti?u ???ng tp 2 n?u c ai ? trong gia ?nh b? b?nh ny, ??ng th?i c m?t ho?c nhi?u y?u t? nguy c? chnh sau ?y:  T?ng cn ho?c th?a cn ho?c bo ph.  L?i s?ng t ho?t ??ng.  Ti?n s? lin t?c ?n th?c ?n nhi?u n?ng l??ng. Duy tr cn n?ng bnh th??ng v ho?t ??ng thn th? th??ng xuyn c th? lm gi?m nguy c? pht tri?n b?nh ti?u ???ng tp 2. TRI?U CH?NG  Ban ??u, m?t ng??i b? b?nh ti?u ???ng tp 2 c th? khng c cc tri?u ch?ng. Cc tri?u ch?ng c?a b?nh ti?u ???ng tp 2 xu?t hi?n t? t?. Cc tri?u ch?ng bao  g?m:  Kht n??c nhi?u (ch?ng kht nhi?u).  Ti?u ti?n nhi?u (?a ni?u).  ?i ti?u nhi?u [place] ban ?m (ti?u ?m).  Thay ??i cn n?ng m?t cch ??t ng?t ho?c khng r nguyn nhn.  Th??ng xuyn b? nhi?m trng ti pht.  M?t m?i (m?t)  Y?u.  Thay ??i th? l?c, ch?ng h?n nh? nhn m?.  Mi tri cy trong h?i th? c?a qu v?.  ?au b?ng.  Bu?n nn ho?c nn m?a.  V?t c?t ho?c v?t b?m tm lu lnh.  ?au bu?t ho?c t ? bn tay ho?c bn chn.  V?t th??ng h? trn da (lot). CH?N ?ON B?nh ti?u ???ng tp 2 th??ng khng ???c ch?n ?on cho ??n khi xu?t hi?n cc bi?n ch?ng c?a b?nh ti?u ???ng. B?nh ti?u ???ng tp 2 ???c ch?n ?on khi xu?t hi?n cc tri?u ch?ng c?ng nh? bi?n ch?ng v khi l??ng ???ng huy?t t?ng. L??ng ???ng huy?t c th? ???c ki?m tra b?ng m?t ho?c nhi?u xt nghi?m mu sau ?y:  Xt nghi?m ???ng huy?t lc ?i. Qu v? s? khng ???c php ?n trong t nh?t l 8 ti?ng tr??c khi l?y m?u mu.  Xt nghi?m ???ng huy?t ng?u nhin. ???ng huy?t ???c xt nghi?m b?t k? lc no trong ngy, b?t k? qu v? ?n lc no.  Xt nghi?m ???ng huy?t A1c hemoglobin. Xt nghi?m A1c hemoglobin cung c?p thng tin v? vi?c ki?m sot ???ng huy?t trong 3 thng tr??c ?.  Xt nghi?m dung n?p glucose theo ???ng u?ng (OGTT). ???ng huy?t c?a qu v? ???c ?o sau khi qu v? ch?a ?n (nh?n ?n) trong 2 gi? v sau ? l sau khi qu v? u?ng ?? u?ng c ch?a glucose. ?I?U TR?   Qu v? c th? c?n dng insulin ho?c thu?c tr? ti?u ???ng hng ngy ?? gi? cho l??ng ???ng huy?t trong ph?m vi mong mu?n.  N?u qu v? dng insulin, qu v? c th? c?n ?i?u ch?nh li?u thu?c ty thu?c  Boakye l??ng carbohydrate m qu v? ?n trong m?i b?a ?n chnh ho?c b?a ?n nh?.  Thay ??i l?i s?ng ???c khuy?n ngh? l m?t ph?n trong bi?n php ?i?u tr? c?a qu v?. Nh?ng thay ??i ny c th? bao g?m:  Tun theo m?t ch? ?? ?n c nhn ha do m?t chuyn gia dinh d??ng ??a ra.  T?p th? d?c hng ngy. Chuyn gia ch?m [place] s?c kh?e s? ??t cc m?c tiu ?i?u tr? c nhn  ha cho qu v? d?a theo tu?i, cc lo?i thu?c c?a qu v?, th?i gian qu v? ? b? ti?u t??ng, v b?t k? tnh tr?ng b?nh l no khc m qu v? c. Thng th??ng, m?c tiu ?i?u tr? l duy tr n?ng ?? glucose trong mu:  Tr??c khi ?n (tr??c b?a ?n): 80-130 mg/dL.  Sau khi ?n (sau b?a ?n): d??i 180 mg/dL.  A1c: th?p h?n 6,5-7%. H??NG D?N CH?M [place] T?I NH   L??ng A1c hemoglobin c?a qu v? ???c ki?m tra hai l?n m?i n?m.  Th?c hi?n vi?c theo di ???ng huy?t hng ngy theo ch? d?n c?a chuyn gia ch?m [place] s?c kh?e.  Theo di [place] trong n??c ti?u khi qu v? b? b?nh v theo ch? d?n c?a chuyn gia ch?m [place] s?c kh?e.  S? d?ng thu?c tr? ti?u ???ng ho?c insulin theo ch? d?n c?a chuyn gia ch?m [place] s?c kh?e ?? duy tr l??ng ???ng huy?t trong ph?m vi mong mu?n.  Khng bao gi? ?? h?t thu?c tr? ti?u ???ng ho?c insulin. Thu?c c?n ph?i dng hng ngy.  N?u qu v? ?ang dng insulin, qu v? c th? ?i?u ch?nh l??ng insulin d?a [place] l??ng carbohydrates qu v? ?n. Carbohydrate c th? lm t?ng l??ng ???ng huy?t nh?ng c?n ph?i bao g?m trong ch? ?? ?n u?ng c?a qu v?. Carbohydrate cung c?p vitamin, khong ch?t v ch?t x?, l m?t ph?n [place]?t y?u c?a ch? ?? ?n u?ng c l?i cho s?c kh?e. Carbohydrate ???c tm th?y trong tri cy, rau, ng? c?c, cc s?n ph?m t? s?a, cc lo?i ??u v cc lo?i th?c ph?m c b? sung thm ???ng.  ?n th?c ?n c l?i cho s?c kh?e. Qu v? c?n h?n g?p m?t chuyn gia dinh d??ng c ??ng k hnh ngh? ?? gip qu v? ??a ra m?t k? ho?ch ?n u?ng ph h?p.  Gi?m cn n?u qu v? th?a cn.  Mang theo th? c?nh bo y t? ho?c ?eo ?? trang s?c c c?nh bo y t?.  Mang theo ?? ?n nh? ch?a 15 gam carbohydrate m?i lc ?? ?i?u tr? h? ???ng huy?t (h? ???ng huy?t). M?t s? v d? v? ?? ?n nh? ch?a 15 gam carbohydrate bao g?m:  Vin glucose, 3 ho?c 4.  Gel glucose, ?ng 15 gam.  Nho kh, 2 mu?ng (24 gam).  Th?ch hnh h?t ??u, 6.  Bnh quy hnh con gi?ng, 8.  N??c u?ng c ga thng th??ng, 4 aox? (120 ml)  K?o chp  chp, 9.  Nh?n bi?t h? ???ng huy?t. H? ???ng huy?t x?y ra khi l??ng ???ng huy?t t? 70 mg/dL tr? xu?ng. Nguy c? h? ???ng huy?t gia t?ng khi nh?n ?n ho?c b? b?a, trong v sau khi t?p th? d?c c??ng ?? cao v trong khi ng?. Cc tri?u ch?ng h? ???ng huy?t c th? bao g?m:  Run ho?c l?c.  Gi?m kh? n?ng t?p trung.  ?? m? hi.  Nh?p tim t?ng.  ?au ??u.  Kh mi?ng.  ?i.  D? b? kch thch.  Lo u.  Ng? [employer].  Thay ??i l?i ni ho?c s? ph?i h?p.  B? l l?n.  ?i?u tr? h? ???ng huy?t k?p th?i. N?u qu v? t?nh to v c th? nu?t m?t cch an ton, hy theo quy t?c 15:15:  Dng 15-20 gam glucose ho?c carbohydrate c tc d?ng nhanh. L?a ch?n tc ??ng nhanh bao g?m gel glucose, vin glucose ho?c 4 aox? (120 ml) n??c p tri cy, soda bnh th??ng ho?c s?a t bo.  Ki?m tra l??ng ???ng huy?t c?a qu v? 15 pht sau khi u?ng glucose.  Dng t? 15-20 gam glucose tr? ln n?u l??ng ???ng huy?t ???c ?o l?i v?n ? m?c 70 mg/dL tr? xu?ng.  ?n theo b?a ?n bnh th??ng ho?c ?? ?n nh? trong vng 1 ti?ng sau khi l??ng ???ng huy?t tr? l?i bnh th??ng.  Hy c?nh gic v?i c?m gic r?t kht v ?i ti?u ti?n nhi?u l?n h?n bnh th??ng v ?y l nh?ng d?u hi?u s?m c?a t?ng ???ng huy?t. Vi?c pht hi?n t?ng ???ng huy?t s?m cho php ?i?u tr? k?p th?i. ?i?u tr? t?ng ???ng huy?t theo ch? d?n c?a chuyn gia ch?m [place] s?c kh?e.  M?i tu?n tham gia [place] t nh?t 150 pht ho?t ??ng thn th? v?i c??ng ?? trung bnh, phn b? trong t nh?t 3 ngy trong tu?n ho?c theo ch? d?n c?a chuyn gia ch?m [place] s?c kh?e. Ngoi ra, qu v? nn tham gia [place] bi t?p c s?c c?n t nh?t 2 l?n m?t tu?n ho?c theo ch? d?n c?a chuyn gia ch?m [place] s?c kh?e. C? g?ng dnh khng qu 90 pht m?i l?n khng ho?t ??ng.  ?i?u ch?nh thu?c v l??ng th?c ?n khi c?n n?u qu v? b?t ??u m?t bi t?p ho?c m?t mn th? thao m?i.  Lm theo k? ho?ch trong ngy b? b?nh c?a qu v? b?t c? lc no m qu v? khng th? ?n ho?c u?ng nh? bnh th??ng.  Khng s? d?ng cc s?n  ph?m thu?c l bao g?m thu?c l ht, thu?c l d?ng nhai ho?c thu?c l ?i?n t?. N?u qu v? c?n gip ?? ?? cai thu?c, hy h?i chuyn gia ch?m [place] s?c kh?e.  Gi?i h?n l??ng r??u qu v? u?ng khng qu 1 ly m?i ngy v?i ph? n? khng mang thai v 2 ly m?i ngy v?i nam gi?i. Qu v? ch? nn u?ng r??u khi ?n. Ni chuy?n v?i chuyn gia ch?m [place] s?c kh?e xem u?ng r??u c an ton cho qu v? hay khng. Cho chuyn gia ch?m [place] s?c kh?e bi?t n?u qu v? u?ng r??u vi l?n m?i tu?n.  Tun th? m?i cu?c h?n khm l?i theo ch? d?n c?a chuyn gia ch?m [place] s?c kh?e. ?i?u ny l quan tr?ng.  S?p x?p bu?i khm m?t ngay sau khi ch?n ?on b?nh ti?u ???ng tp 2 v sau ? l hng n?m.  Th?c hi?n ch?m [place] da v bn chn hng ngy. Ki?m tra da v bn chn hng ngy xem c v?t c?t, v?t b?m tm, t?y ??, v?n ?? v? mng, ch?y mu, m?n n??c hay l? lot khng. Bn chn c?n ???c chuyn gia ch?m [place] s?c kh?e khm hng n?m.  ?nh r?ng v l?i t nh?t hai l?n m?i ngy v dng ch? nha khoa t nh?t m?t l?n m?i ngy. G?p nha s? ?? khm l?i th??ng xuyn.  Chia s? k? ho?ch qu?n l b?nh ti?u ???ng c?a qu v? ? n?i lm vi?c ho?c tr??ng h?c c?a qu v?.  C?p nh?t l?ch tim  ch?ng c?a qu v?. Qu v? nn tim v?c xin cm (b?nh cm) hng n?m. Qu v? c?ng nn tim v?c xin vim ph?i (ph? c?u khu?n). N?u qu v? 65 tu?i tr? ln v ch?a ???c tim v?c xin vim ph?i, v?c xin ny c th? ???c tim m?t lo?t hai m?i ring. Hy h?i chuyn gia ch?m [place] s?c kh?e xem nn tim thm lo?i v?c xin no.  H?c cch qu?n l c?ng th?ng.  Xin ???c gio d?c v h? tr? v? b?nh ti?u ???ng th??ng xuyn khi c?n.  Tham gia ho?c tm cch ph?c h?i ch?c n?ng khi c?n [place]?t ?? duy tr ho?c c?i Veronique?n kh? n?ng ??c l?p v ch?t l??ng cu?c s?ng. Yu c?u chuy?n sang v?t l tr? li?u ho?c li?u php ngh? nghi?p n?u qu v? b? t bn chn ho?c t tay, ho?c kh ch?i ??u, kh m?c qu?n o, ?n u?ng ho?c ho?t ??ng th? ch?t. ?I [facility] N?U:   Qu v? khng th? ?n ho?c u?ng trong h?n 6 ti?ng.  Qu v? b?  bu?n nn v nn m?a trong h?n 6 ti?ng.  L??ng ???ng huy?t c?a qu v? cao trn 240 mg/dL.  C thay ??i tr?ng [place] tinh th?n.  Qu v? b? thm m?t c?n b?nh nghim tr?ng.  Qu v? b? tiu ch?y trong h?n 6 ti?ng.  Qu v? ? b? ?m ho?c b? s?t trong m?t vi ngy v khng ?? h?n.  Qu v? b? ?au trong khi tham gia b?t k? ho?t ??ng thn th? no. NGAY L?P T?C ?I [facility] N?U:  Qu v? b? kh th?.  Qu v? c l??ng ketone ? m?c trung bnh ??n cao.   Thng tin ny khng nh?m m?c ?ch thay th? cho l?i khuyn m chuyn gia ch?m [place] s?c kh?e ni v?i qu v?. Hy b?o ??m qu v? ph?i th?o lu?n b?t k? v?n ?? g m qu v? c v?i chuyn gia ch?m [place] s?c kh?e c?a qu v?.   Document Released: 11/29/2005 Document Revised: 08/20/2015 Elsevier Interactive Patient Education 2016 [employer]. Diabetes Mellitus and Food It is important for you to manage your blood sugar (glucose) level. Your blood glucose level can be greatly affected by what you eat. Eating healthier foods in the appropriate amounts throughout the day at about the same time each day will help you control your blood glucose level. It can also help slow or prevent worsening of your diabetes mellitus. Healthy eating may even help you improve the level of your blood pressure and reach or maintain a healthy weight.  General recommendations for healthful eating and cooking habits include:  Eating meals and snacks regularly. Avoid going long periods of time without eating to lose weight.  Eating a diet that consists mainly of plant-based foods, such as fruits, vegetables, nuts, legumes, and whole grains.  Using low-heat cooking methods, such as baking, instead of high-heat cooking methods, such as deep frying. Work with your dietitian to make sure you understand how to use the Nutrition Facts information on food labels. HOW CAN FOOD AFFECT ME? Carbohydrates Carbohydrates affect your blood glucose level more than any other type of food. Your dietitian will  help you determine how many carbohydrates to eat at each meal and teach you how to count carbohydrates. Counting carbohydrates is important to keep your blood glucose at a healthy level, especially if you are using insulin or taking certain medicines for diabetes mellitus. Alcohol Alcohol can cause sudden decreases in blood glucose (hypoglycemia), especially if you use  insulin or take certain medicines for diabetes mellitus. Hypoglycemia can be a life-threatening condition. Symptoms of hypoglycemia (sleepiness, dizziness, and disorientation) are similar to symptoms of having too much alcohol.  If your health care provider has given you approval to drink alcohol, do so in moderation and use the following guidelines:  Women should not have more than one drink per day, and men should not have more than two drinks per day. One drink is equal to:  12 oz of beer.  5 oz of wine.  1 oz of hard liquor.  Do not drink on an empty stomach.  Keep yourself hydrated. Have water, diet soda, or unsweetened iced tea.  Regular soda, juice, and other mixers might contain a lot of carbohydrates and should be counted. WHAT FOODS ARE NOT RECOMMENDED? As you make food choices, it is important to remember that all foods are not the same. Some foods have fewer nutrients per serving than other foods, even though they might have the same number of calories or carbohydrates. It is difficult to get your body what it needs when you eat foods with fewer nutrients. Examples of foods that you should avoid that are high in calories and carbohydrates but low in nutrients include:  Trans fats (most processed foods list trans fats on the Nutrition Facts label).  Regular soda.  Juice.  Candy.  Sweets, such as cake, pie, doughnuts, and cookies.  Fried foods. WHAT FOODS CAN I EAT? Eat nutrient-rich foods, which will nourish your body and keep you healthy. The food you should eat also will depend on several factors,  including:  The calories you need.  The medicines you take.  Your weight.  Your blood glucose level.  Your blood pressure level.  Your cholesterol level. You should eat a variety of foods, including:  Protein.  Lean cuts of meat.  Proteins low in saturated fats, such as fish, egg whites, and beans. Avoid processed meats.  Fruits and vegetables.  Fruits and vegetables that may help control blood glucose levels, such as apples, mangoes, and yams.  Dairy products.  Choose fat-free or low-fat dairy products, such as milk, yogurt, and cheese.  Grains, bread, pasta, and rice.  Choose whole grain products, such as multigrain bread, whole oats, and brown rice. These foods may help control blood pressure.  Fats.  Foods containing healthful fats, such as nuts, avocado, olive oil, canola oil, and fish. DOES EVERYONE WITH DIABETES MELLITUS HAVE THE SAME MEAL PLAN? Because every person with diabetes mellitus is different, there is not one meal plan that works for everyone. It is very important that you meet with a dietitian who will help you create a meal plan that is just right for you.   This information is not intended to replace advice given to you by your health care provider. Make sure you discuss any questions you have with your health care provider.   Document Released: 08/26/2005 Document Revised: 12/20/2014 Document Reviewed: 10/26/2013 Elsevier Interactive Patient Education [employer]

## 2016-01-18 NOTE — Progress Notes (Signed)
Subjective:    Patient ID: Courtney [name], female    DOB: [birth year], 70 y.o.   MRN: [id number] By signing my name below, I, Javier Docker, attest that this documentation has been prepared under the direction and in the presence of Norberto Sorenson, MD. Electronically Signed: Javier Docker, ER Scribe. 01/18/2016. 9:50 AM.  Chief Complaint  Patient presents with  . Cough    2 days--with chest congestion--worse in laying down  . Shortness of Breath  . Wheezing    HPI HPI Comments: Courtney [name] is a 70 y.o. female who presents to [facility] complaining of trouble breathing, sore throat, and cough productive of a light yellow drainage for the last two days. She had a fever yesterday. She has trouble breathing when she sits still. She has smoked for many years and currently smokes 5-7 cigarettes per day. The pt does not have a PCP.  The pt has been seen at [facility] twice prior each time with SOB and chest pain worse when laying flat on her back. Each time she was found to have an acute pulmonary infection and treated with abx. She was referred to cardiology but she never scheduled her appmt. Pt speaks vietnamese, grandson translates for her.   Past Medical History  Diagnosis Date  . Allergy    No Known Allergies  Current Outpatient Prescriptions on File Prior to Visit  Medication Sig Dispense Refill  . albuterol (PROVENTIL HFA;VENTOLIN HFA) 108 (90 BASE) MCG/ACT inhaler Inhale 2 puffs into the lungs every 4 (four) hours as needed for wheezing or shortness of breath (cough, shortness of breath or wheezing.). (Patient not taking: Reported on 01/18/2016) 1 Inhaler 1  . amoxicillin-clavulanate (AUGMENTIN) 875-125 MG per tablet Take 1 tablet by mouth 2 (two) times daily. (Patient not taking: Reported on 01/18/2016) 20 tablet 0  . fluticasone (FLONASE) 50 MCG/ACT nasal spray Place 2 sprays into both nostrils at bedtime. (Patient not taking: Reported on 01/18/2016) 16 g 2  . Ibuprofen (ADVIL) 200 MG CAPS Take by mouth.  Reported on 01/18/2016    . ipratropium (ATROVENT) 0.03 % nasal spray Place 2 sprays into the nose 4 (four) times daily. (Patient not taking: Reported on 01/18/2016) 30 mL 1  . omeprazole (PRILOSEC) 40 MG capsule Take 1 capsule (40 mg total) by mouth daily. (Patient not taking: Reported on 01/18/2016) 30 capsule 3  . polyethylene glycol powder (GLYCOLAX/MIRALAX) powder Take 17 g by mouth 2 (two) times daily as needed. (Patient not taking: Reported on 01/18/2016) 3350 g 1   No current facility-administered medications on file prior to visit.    Review of Systems  Constitutional: Positive for fever, activity change and fatigue. Negative for chills and unexpected weight change.  HENT: Positive for congestion, postnasal drip, rhinorrhea and sore throat.   Respiratory: Positive for cough, chest tightness, shortness of breath and wheezing.   Cardiovascular: Positive for chest pain. Negative for palpitations.  Psychiatric/Behavioral: Positive for sleep disturbance.      Objective:  BP 142/86 mmHg  Pulse 74  Temp(Src) 98.4 F (36.9 C) (Oral)  Resp 18  Ht  (1.372 m)  Wt 114 lb (51.71 kg)  BMI 27.47 kg/m2  SpO2 93%  Physical Exam  Constitutional: She is oriented to person, place, and time. She appears well-developed and well-nourished. No distress.  HENT:  Head: Normocephalic and atraumatic.  TMs with erythema left greater than right. Nasal rhinitis with clear discharge. oropharynx with erythrasma.   Eyes: Pupils are equal, round,  and reactive to light.  Neck: Neck supple.  Normal thyroid. Anterior cervcical adenopathy. No supraclavicular adenopathy.   Cardiovascular: Normal rate, regular rhythm and normal heart sounds.   No murmur heard. Pulmonary/Chest: Effort normal. No respiratory distress.  Decreased breath sounds throughout with inspiratory and expiratory rales and wheezing throughout all lung fields.  Musculoskeletal: Normal range of motion.  Neurological: She is alert and oriented  to person, place, and time. Coordination normal.  Skin: Skin is warm and dry. She is not diaphoretic.  Psychiatric: She has a normal mood and affect. Her behavior is normal.  Nursing note and vitals reviewed.  Recheck after duoneb, much improved air movement. Still with few inspiratory rhonchi and expiratory wheezes.      Results for orders placed or performed in visit on 01/18/16  POCT CBC  Result Value Ref Range   WBC 10.8 (A) 4.6 - 10.2 K/uL   Lymph, poc 2.4 0.6 - 3.4   POC LYMPH PERCENT 22.5 10 - 50 %L   MID (cbc) 0.8 0 - 0.9   POC MID % 7.3 0 - 12 %M   POC Granulocyte 7.6 (A) 2 - 6.9   Granulocyte percent 70.2 37 - 80 %G   RBC 4.28 4.04 - 5.48 M/uL   Hemoglobin 13.7 12.2 - 16.2 g/dL   HCT, POC 40.9 81.1 - 47.9 %   MCV 91.3 80 - 97 fL   MCH, POC 32.0 (A) 27 - 31.2 pg   MCHC 35.0 31.8 - 35.4 g/dL   RDW, POC 91.4 %   Platelet Count, POC 309 142 - 424 K/uL   MPV 7.6 0 - 99.8 fL  POCT glucose (manual entry)  Result Value Ref Range   POC Glucose 143 (A) 70 - 99 mg/dl  POCT glycosylated hemoglobin (Hb A1C)  Result Value Ref Range   Hemoglobin A1C 8.2    Dg Chest 2 View  01/18/2016  CLINICAL DATA:  70 year old female with shortness of breath, wheezing, abnormal pulmonary auscultation. Acute illness. Smoker. Initial encounter. EXAM: CHEST  2 VIEW COMPARISON:  05/05/2015 and 12/07/2013. FINDINGS: Lung volumes have not significantly changed. Chronic bilateral pulmonary interstitial markings with lung base predominance are stable compared to both prior studies. No superimposed pneumothorax, pulmonary edema, pleural effusion or consolidation. No acute pulmonary opacity. Normal cardiac size and mediastinal contours. Visualized tracheal air column is within normal limits. No acute osseous abnormality identified. Calcified aortic atherosclerosis. IMPRESSION: Stable chronic lung disease. No acute cardiopulmonary abnormality identified. Electronically Signed   By: Odessa Fleming M.D.   On: 01/18/2016  10:20    Assessment & Plan:    1. COPD exacerbation (HCC)   2. Elevated glucose   3. Abdominal pain, chronic, epigastric   4. LLQ abdominal mass   5. Abnormal chest x-ray   6. Type 2 diabetes mellitus with hyperglycemia, without long-term current use of insulin (HCC)   7. Chronic obstructive pulmonary disease, unspecified COPD type (HCC)   8. Tobacco use disorder - encouraged cessation  9. Overweight (BMI 25.0-29.9)    New diagnosis of DM II today with hgba1c of 8.2 made at end of visit - pt has been waiting here sev hrs prior to appt with her whole family inc mult children so unable to have detailed discussion today of diabetic education/guidelines/cbgs. Certainly the language/cultural barrier is going to make this challenging.  Briefly reviewed low carb diet.  Recheck in 1 mo and will likely want to start low dose metformin at that time and obtain fasting  labs. Sent glucometer rx to pharmacy - bring to f/u OV in 1 mo to instruct on use.    Her grandson - who appears to be an older teenager - interprets for pt.  Orders Placed This Encounter  Procedures  . DG Chest 2 View    Standing Status: Future     Number of Occurrences: 1     Standing Expiration Date: 01/17/2017    Order Specific Question:  Reason for Exam (SYMPTOM  OR DIAGNOSIS REQUIRED)    Answer:  shortness of breath and rales/wheezes in acutely-ill smoker    Order Specific Question:  Preferred imaging location?    Answer:  External  . POCT CBC  . POCT glucose (manual entry)  . POCT glycosylated hemoglobin (Hb A1C)    Meds ordered this encounter  Medications  . albuterol (PROVENTIL) (2.5 MG/3ML) 0.083% nebulizer solution 2.5 mg    Sig:   . ipratropium (ATROVENT) nebulizer solution 0.5 mg    Sig:   . albuterol (PROVENTIL HFA;VENTOLIN HFA) 108 (90 Base) MCG/ACT inhaler    Sig: Inhale 2 puffs into the lungs every 4 (four) hours as needed for wheezing or shortness of breath (cough, shortness of breath or wheezing.).     Dispense:  1 Inhaler    Refill:  1  . cefTRIAXone (ROCEPHIN) injection 1 g    Sig:     Order Specific Question:  Antibiotic Indication:    Answer:  Cellulitis  . methylPREDNISolone sodium succinate (SOLU-MEDROL) 125 mg/2 mL injection 125 mg    Sig:   . amoxicillin-clavulanate (AUGMENTIN) 875-125 MG tablet    Sig: Take 1 tablet by mouth 2 (two) times daily.    Dispense:  20 tablet    Refill:  0  . predniSONE (DELTASONE) 20 MG tablet    Sig: Take 2 tablets (40 mg total) by mouth daily with breakfast.    Dispense:  10 tablet    Refill:  0   Language level caveat, pt speaks vietnamese, grandson translates.   I personally performed the services described in this documentation, which was scribed in my presence. The recorded information has been reviewed and considered, and addended by me as needed.  Norberto Sorenson, MD MPH

## 2016-01-24 MED ORDER — FREESTYLE SYSTEM KIT: PACK | Status: DC

## 2016-01-25 DIAGNOSIS — R69 Illness, unspecified: Secondary | ICD-10-CM | POA: Diagnosis not present

## 2016-05-25 ENCOUNTER — Emergency Department (HOSPITAL_COMMUNITY)
Admission: EM | Admit: 2016-05-25 | Discharge: 2016-05-25 | Disposition: A | Discharge status: Home / Self Care | Payer: Medicare HMO | Attending: Emergency Medicine | Admitting: Emergency Medicine

## 2016-05-25 ENCOUNTER — Encounter (HOSPITAL_COMMUNITY): Payer: Self-pay

## 2016-05-25 ENCOUNTER — Emergency Department (HOSPITAL_COMMUNITY): Payer: Medicare HMO

## 2016-05-25 DIAGNOSIS — R69 Illness, unspecified: Secondary | ICD-10-CM | POA: Diagnosis not present

## 2016-05-25 DIAGNOSIS — M62838 Other muscle spasm: Secondary | ICD-10-CM | POA: Diagnosis not present

## 2016-05-25 DIAGNOSIS — F17200 Nicotine dependence, unspecified, uncomplicated: Secondary | ICD-10-CM | POA: Diagnosis not present

## 2016-05-25 DIAGNOSIS — Z79899 Other long term (current) drug therapy: Secondary | ICD-10-CM | POA: Diagnosis not present

## 2016-05-25 DIAGNOSIS — R1011 Right upper quadrant pain: Secondary | ICD-10-CM | POA: Diagnosis not present

## 2016-05-25 DIAGNOSIS — K224 Dyskinesia of esophagus: Secondary | ICD-10-CM | POA: Diagnosis not present

## 2016-05-25 DIAGNOSIS — R0789 Other chest pain: Secondary | ICD-10-CM | POA: Diagnosis not present

## 2016-05-25 LAB — BASIC METABOLIC PANEL
Anion gap: 8 (ref 5–15)
BUN: 17 mg/dL (ref 6–20)
CO2: 22 mmol/L (ref 22–32)
Calcium: 9.3 mg/dL (ref 8.9–10.3)
Chloride: 108 mmol/L (ref 101–111)
Creatinine, Ser: 0.73 mg/dL (ref 0.44–1.00)
GFR calc Af Amer: >60 mL/min (ref >60)
GFR calc non Af Amer: >60 mL/min (ref >60)
Glucose, Bld: 191 mg/dL — ABNORMAL HIGH (ref 65–99)
Potassium: 3.6 mmol/L (ref 3.5–5.1)
Sodium: 138 mmol/L (ref 135–145)

## 2016-05-25 LAB — CBC
HCT: 37.6 % (ref 36.0–46.0)
Hemoglobin: 12.1 g/dL (ref 12.0–15.0)
MCH: 29.9 pg (ref 26.0–34.0)
MCHC: 32.2 g/dL (ref 30.0–36.0)
MCV: 92.8 fL (ref 78.0–100.0)
Platelets: 308 10*3/uL (ref 150–400)
RBC: 4.05 MIL/uL (ref 3.87–5.11)
RDW: 13 % (ref 11.5–15.5)
WBC: 9.7 10*3/uL (ref 4.0–10.5)

## 2016-05-25 LAB — I-STAT TROPONIN, ED: Troponin i, poc: 0.01 ng/mL (ref 0.00–0.08)

## 2016-05-25 LAB — HEPATIC FUNCTION PANEL
ALT: 18 U/L (ref 14–54)
AST: 21 U/L (ref 15–41)
Albumin: 3.9 g/dL (ref 3.5–5.0)
Alkaline Phosphatase: 78 U/L (ref 38–126)
Bilirubin, Direct: <0.1 mg/dL — ABNORMAL LOW (ref 0.1–0.5)
Total Bilirubin: 0.3 mg/dL (ref 0.3–1.2)
Total Protein: 7.5 g/dL (ref 6.5–8.1)

## 2016-05-25 LAB — LIPASE, BLOOD: Lipase: 32 U/L (ref 11–51)

## 2016-05-25 LAB — D-DIMER, QUANTITATIVE: D-Dimer, Quant: <0.27 ug/mL-FEU (ref 0.00–0.50)

## 2016-05-25 MED ORDER — GI COCKTAIL ~~LOC~~
30.0000 mL | Freq: Once | ORAL | Status: AC
  Administered 2016-05-25: 30 mL via ORAL
  Filled 2016-05-25: qty 30

## 2016-05-25 MED ORDER — OMEPRAZOLE 20 MG PO CPDR: 20.0000 mg | DELAYED_RELEASE_CAPSULE | Freq: Every day | ORAL | Status: DC

## 2016-05-25 NOTE — ED Provider Notes (Signed)
CSN: [id number]     Arrival date & time 05/25/16  1806 History   First MD Initiated Contact with Patient 05/25/16 2056     Chief Complaint  Patient presents with  . Chest Pain   (Consider location/radiation/quality/duration/timing/severity/associated sxs/prior Treatment) HPI 70 y.o. female presents to the Emergency Department today complaining of chest tightness since last night. Pt daughter states that the patient was lying in bed when she developed a chest tightness in the center of her chest. Notes radiation to neck. Notes worsening with inspiration. Notes pain 8/10. Pressure. Has been constant since last night. No hx ACS. Unknown FH ACS. No Hx DVT/PE. Pt has been having breathing problems for the past year. No fevers. Does have epigastric pain and RUQ pain. Pt eats small meals throughout the day, but sometimes with big meals she can become nauseated. No emesis. No fevers. No headache. No vision changes. Pt is not on blood thinners. No other symptoms noted.      Past Medical History  Diagnosis Date  . Allergy    History reviewed. No pertinent past surgical history. No family history on file. Social History  Substance Use Topics  . Smoking status: Current Every Day Smoker  . Smokeless tobacco: None  . Alcohol Use: None   OB History    No data available     Review of Systems ROS reviewed and all are negative for acute change except as noted in the HPI.  Allergies  Review of patient's allergies indicates no known allergies.  Home Medications   Prior to Admission medications   Medication Sig Start Date End Date Taking? Authorizing Provider  albuterol (PROVENTIL HFA;VENTOLIN HFA) 108 (90 Base) MCG/ACT inhaler Inhale 2 puffs into the lungs every 4 (four) hours as needed for wheezing or shortness of breath (cough, shortness of breath or wheezing.). 01/18/16   Shawnee Knapp, MD  amoxicillin-clavulanate (AUGMENTIN) 875-125 MG tablet Take 1 tablet by mouth 2 (two) times daily. 01/18/16   Shawnee Knapp, MD  glucose monitoring kit (FREESTYLE) monitoring kit Check cbgs bid as instructed by pcp. 01/24/16   Shawnee Knapp, MD  Ibuprofen (ADVIL) 200 MG CAPS Take by mouth. Reported on 01/18/2016    Historical Provider, MD  predniSONE (DELTASONE) 20 MG tablet Take 2 tablets (40 mg total) by mouth daily with breakfast. 01/18/16   Shawnee Knapp, MD   BP 150/61 mmHg  Pulse 50  Temp(Src) 98.5 F (36.9 C) (Oral)  Resp 20  SpO2 99%   Physical Exam  Constitutional: She is oriented to person, place, and time. She appears well-developed and well-nourished.  HENT:  Head: Normocephalic and atraumatic.  Eyes: EOM are normal. Pupils are equal, round, and reactive to light.  Neck: Normal range of motion. Neck supple. No tracheal deviation present.  Cardiovascular: Regular rhythm, S1 normal, S2 normal, normal heart sounds, intact distal pulses and normal pulses.  Bradycardia present.   No murmur heard. Pulmonary/Chest: Effort normal and breath sounds normal. No respiratory distress. She has no wheezes. She has no rales. She exhibits no tenderness.  Abdominal: Soft. Normal appearance and bowel sounds are normal. There is tenderness in the right upper quadrant and epigastric area. There is positive Murphy's sign. There is no rigidity, no rebound, no guarding and no tenderness at McBurney's point.  Musculoskeletal: Normal range of motion.  Neurological: She is alert and oriented to person, place, and time.  Skin: Skin is warm and dry.  Psychiatric: She has a normal mood and affect.  Her behavior is normal. Thought content normal.  Nursing note and vitals reviewed.  ED Course  Procedures (including critical care time) Labs Review Labs Reviewed  BASIC METABOLIC PANEL - Abnormal; Notable for the following:    Glucose, Bld 191 (*)    All other components within normal limits  HEPATIC FUNCTION PANEL - Abnormal; Notable for the following:    Bilirubin, Direct <0.1 (*)    All other components within normal limits   CBC  LIPASE, BLOOD  D-DIMER, QUANTITATIVE (NOT AT [facility])  Randolm Idol, ED   Imaging Review Dg Chest 2 View  05/25/2016  CLINICAL DATA:  Chest tightness last night, pain with inspiration. EXAM: CHEST  2 VIEW COMPARISON:  Chest x-rays dated 01/18/2016 and 12/07/2013. FINDINGS: Probable mild atelectasis and/or scarring at each lung base, stable compared to previous exams. No new lung findings. Heart size is upper normal, stable. Overall cardiomediastinal silhouette is stable in size and configuration. IMPRESSION: Stable chest x-ray.  No active cardiopulmonary disease. Electronically Signed   By: Franki Cabot M.D.   On: 05/25/2016 18:40   US Abdomen Limited Ruq  05/25/2016  CLINICAL DATA:  Right upper quadrant pain. EXAM: US ABDOMEN LIMITED - RIGHT UPPER QUADRANT COMPARISON:  None. FINDINGS: Gallbladder: No gallstones or wall thickening visualized. No sonographic Murphy sign noted by sonographer. Common bile duct: Diameter: 6 mm. Liver: No focal lesion identified. Within normal limits in parenchymal echogenicity. IMPRESSION: Normal right upper quadrant ultrasound. Electronically Signed   By: Markus Daft M.D.   On: 05/25/2016 22:23   I have personally reviewed and evaluated these images and lab results as part of my medical decision-making.   EKG Interpretation   Date/Time:  Tuesday May 25 2016 18:16:03 EDT Ventricular Rate:  67 PR Interval:  116 QRS Duration: 70 QT Interval:  466 QTC Calculation: 492 R Axis:   63 Text Interpretation:  Sinus rhythm with Premature atrial complexes with  Abberant conduction Possible Left atrial enlargement Prolonged QT Abnormal  ECG Confirmed by Alvino Chapel  MD, Ovid Curd [phone]) on 05/25/2016 10:33:19 PM      MDM  I have reviewed and evaluated the relevant laboratory valuesI have reviewed and evaluated the relevant imaging studies. I have interpreted the relevant EKG.I have reviewed the relevant previous healthcare records.I have reviewed EMS  Documentation.I obtained HPI from historian. Patient discussed with supervising physician  ED Course:  Assessment: Pt is a 69yF presents with chest tightness since last night and constant since then. Patient is to be discharged with recommendation to follow up with PCP in regards to today's hospital visit. Chest pain is not likely of cardiac or pulmonary etiology d/t presentation, perc negative, VS showed bradycardia. Does have bigeminy on EKG, no tracheal deviation, no JVD or new murmur, RRR, breath sounds equal bilaterally, EKG without acute abnormalities, negative troponin, and negative CXR. D Dimer negative. Lipase neg. RUQ [place] neg. Possible reflux type symptoms. Given Gi cocktail with improvement of symptoms. Pt has been advised to start PPI and return to the ED is CP becomes exertional, associated with diaphoresis or nausea, radiates to left jaw/arm, worsens or becomes concerning in any way. Pt appears reliable for follow up and is agreeable to discharge. Patient is in no acute distress. Vital Signs are stable. Patient is able to ambulate. Patient able to tolerate PO.   Disposition/Plan:  DC Home Additional Verbal discharge instructions given and discussed with patient.  Pt Instructed to f/u with PCP in the next week for evaluation and treatment of symptoms.  Return precautions given Pt acknowledges and agrees with plan  Supervising Physician Davonna Belling, MD   Final diagnoses:  RUQ pain  Esophageal spasm       Shary Decamp, PA-C 05/25/16 2255  Davonna Belling, MD 05/25/16 [phone]

## 2016-05-25 NOTE — ED Notes (Signed)
Patient here with family who state that patient developed chest tightness last night and has more pain with inspiration.  No medical history per family

## 2016-05-25 NOTE — Discharge Instructions (Signed)
Please read and follow all provided instructions.  Your diagnoses today include:  1. Esophageal spasm   2. RUQ pain    Tests performed today include:  An EKG of your heart  A chest x-ray  Cardiac enzymes - a blood test for heart muscle damage  Blood counts and electrolytes  Vital signs. See below for your results today.   Medications prescribed:   Take any prescribed medications only as directed.  Follow-up instructions: Please follow-up with your primary care provider as soon as you can for further evaluation of your symptoms.   Return instructions:  SEEK IMMEDIATE MEDICAL ATTENTION IF:  You have severe chest pain, especially if the pain is crushing or pressure-like and spreads to the arms, back, neck, or jaw, or if you have sweating, nausea (feeling sick to your stomach), or shortness of breath. THIS IS AN EMERGENCY. Don't wait to see if the pain will go away. Get medical help at once. Call 911 or 0 (operator). DO NOT drive yourself to the hospital.   Your chest pain gets worse and does not go away with rest.   You have an attack of chest pain lasting longer than usual, despite rest and treatment with the medications your caregiver has prescribed.   You wake from sleep with chest pain or shortness of breath.  You feel dizzy or faint.  You have chest pain not typical of your usual pain for which you originally saw your caregiver.   You have any other emergent concerns regarding your health.  Additional Information: Chest pain comes from many different causes. Your caregiver has diagnosed you as having chest pain that is not specific for one problem, but does not require admission.  You are at low risk for an acute heart condition or other serious illness.   Your vital signs today were: BP 149/60 mmHg   Pulse 112   Temp(Src) 98.5 F (36.9 C) (Oral)   Resp 15   SpO2 100% If your blood pressure (BP) was elevated above 135/85 this visit, please have this repeated by your  doctor within one month. --------------

## 2016-05-25 NOTE — ED Notes (Signed)
Patient transported to Ultrasound 

## 2016-09-15 DIAGNOSIS — Z0000 Encounter for general adult medical examination without abnormal findings: Secondary | ICD-10-CM | POA: Diagnosis not present

## 2016-09-15 DIAGNOSIS — Z6828 Body mass index (BMI) 28.0-28.9, adult: Secondary | ICD-10-CM | POA: Diagnosis not present

## 2017-06-16 DIAGNOSIS — R209 Unspecified disturbances of skin sensation: Secondary | ICD-10-CM | POA: Diagnosis not present

## 2017-06-16 DIAGNOSIS — M25561 Pain in right knee: Secondary | ICD-10-CM | POA: Diagnosis not present

## 2017-06-16 DIAGNOSIS — H9113 Presbycusis, bilateral: Secondary | ICD-10-CM | POA: Diagnosis not present

## 2017-06-16 DIAGNOSIS — E663 Overweight: Secondary | ICD-10-CM | POA: Diagnosis not present

## 2017-06-16 DIAGNOSIS — R079 Chest pain, unspecified: Secondary | ICD-10-CM | POA: Diagnosis not present

## 2017-06-16 DIAGNOSIS — Z6828 Body mass index (BMI) 28.0-28.9, adult: Secondary | ICD-10-CM | POA: Diagnosis not present

## 2017-06-16 DIAGNOSIS — M25562 Pain in left knee: Secondary | ICD-10-CM | POA: Diagnosis not present

## 2017-06-16 DIAGNOSIS — K219 Gastro-esophageal reflux disease without esophagitis: Secondary | ICD-10-CM | POA: Diagnosis not present

## 2017-06-16 DIAGNOSIS — Z0000 Encounter for general adult medical examination without abnormal findings: Secondary | ICD-10-CM | POA: Diagnosis not present

## 2017-06-16 DIAGNOSIS — R69 Illness, unspecified: Secondary | ICD-10-CM | POA: Diagnosis not present

## 2017-06-16 DIAGNOSIS — K5900 Constipation, unspecified: Secondary | ICD-10-CM | POA: Diagnosis not present

## 2017-06-16 DIAGNOSIS — M545 Low back pain: Secondary | ICD-10-CM | POA: Diagnosis not present

## 2017-06-16 DIAGNOSIS — H259 Unspecified age-related cataract: Secondary | ICD-10-CM | POA: Diagnosis not present

## 2017-09-04 ENCOUNTER — Encounter (HOSPITAL_COMMUNITY): Payer: Self-pay | Admitting: Emergency Medicine

## 2017-09-04 ENCOUNTER — Emergency Department (HOSPITAL_COMMUNITY): Payer: Medicare HMO

## 2017-09-04 ENCOUNTER — Other Ambulatory Visit: Payer: Self-pay

## 2017-09-04 ENCOUNTER — Inpatient Hospital Stay (HOSPITAL_COMMUNITY)
Admission: EM | Admit: 2017-09-04 | Discharge: 2017-09-06 | DRG: 244 | Disposition: A | Discharge status: Home / Self Care | Payer: Medicare HMO | Attending: Cardiovascular Disease | Admitting: Cardiovascular Disease

## 2017-09-04 DIAGNOSIS — F17200 Nicotine dependence, unspecified, uncomplicated: Secondary | ICD-10-CM | POA: Diagnosis present

## 2017-09-04 DIAGNOSIS — K219 Gastro-esophageal reflux disease without esophagitis: Secondary | ICD-10-CM | POA: Diagnosis not present

## 2017-09-04 DIAGNOSIS — I495 Sick sinus syndrome: Secondary | ICD-10-CM

## 2017-09-04 DIAGNOSIS — E118 Type 2 diabetes mellitus with unspecified complications: Secondary | ICD-10-CM | POA: Diagnosis present

## 2017-09-04 DIAGNOSIS — E119 Type 2 diabetes mellitus without complications: Secondary | ICD-10-CM | POA: Diagnosis present

## 2017-09-04 DIAGNOSIS — R002 Palpitations: Secondary | ICD-10-CM | POA: Diagnosis not present

## 2017-09-04 DIAGNOSIS — I480 Paroxysmal atrial fibrillation: Secondary | ICD-10-CM

## 2017-09-04 DIAGNOSIS — I10 Essential (primary) hypertension: Secondary | ICD-10-CM | POA: Diagnosis present

## 2017-09-04 DIAGNOSIS — Z833 Family history of diabetes mellitus: Secondary | ICD-10-CM | POA: Diagnosis not present

## 2017-09-04 DIAGNOSIS — R001 Bradycardia, unspecified: Secondary | ICD-10-CM | POA: Diagnosis not present

## 2017-09-04 DIAGNOSIS — R42 Dizziness and giddiness: Secondary | ICD-10-CM

## 2017-09-04 DIAGNOSIS — Z959 Presence of cardiac and vascular implant and graft, unspecified: Secondary | ICD-10-CM

## 2017-09-04 DIAGNOSIS — R079 Chest pain, unspecified: Secondary | ICD-10-CM | POA: Diagnosis not present

## 2017-09-04 DIAGNOSIS — R0602 Shortness of breath: Secondary | ICD-10-CM | POA: Diagnosis not present

## 2017-09-04 DIAGNOSIS — IMO0002 Reserved for concepts with insufficient information to code with codable children: Secondary | ICD-10-CM | POA: Diagnosis present

## 2017-09-04 DIAGNOSIS — I499 Cardiac arrhythmia, unspecified: Secondary | ICD-10-CM | POA: Diagnosis present

## 2017-09-04 DIAGNOSIS — E1165 Type 2 diabetes mellitus with hyperglycemia: Secondary | ICD-10-CM | POA: Diagnosis present

## 2017-09-04 DIAGNOSIS — R110 Nausea: Secondary | ICD-10-CM | POA: Diagnosis not present

## 2017-09-04 DIAGNOSIS — J9811 Atelectasis: Secondary | ICD-10-CM | POA: Diagnosis not present

## 2017-09-04 DIAGNOSIS — I482 Chronic atrial fibrillation, unspecified: Secondary | ICD-10-CM | POA: Diagnosis present

## 2017-09-04 DIAGNOSIS — R51 Headache: Secondary | ICD-10-CM | POA: Diagnosis not present

## 2017-09-04 DIAGNOSIS — R69 Illness, unspecified: Secondary | ICD-10-CM | POA: Diagnosis not present

## 2017-09-04 DIAGNOSIS — I5030 Unspecified diastolic (congestive) heart failure: Secondary | ICD-10-CM | POA: Diagnosis not present

## 2017-09-04 HISTORY — DX: Bradycardia, unspecified: R00.1

## 2017-09-04 HISTORY — DX: Paroxysmal atrial fibrillation: I48.0

## 2017-09-04 HISTORY — DX: Sick sinus syndrome: I49.5

## 2017-09-04 LAB — COMPREHENSIVE METABOLIC PANEL
ALT: 18 U/L (ref 14–54)
AST: 21 U/L (ref 15–41)
Albumin: 4.1 g/dL (ref 3.5–5.0)
Alkaline Phosphatase: 82 U/L (ref 38–126)
Anion gap: 9 (ref 5–15)
BUN: 12 mg/dL (ref 6–20)
CO2: 25 mmol/L (ref 22–32)
Calcium: 9.5 mg/dL (ref 8.9–10.3)
Chloride: 106 mmol/L (ref 101–111)
Creatinine, Ser: 0.59 mg/dL (ref 0.44–1.00)
GFR calc Af Amer: >60 mL/min (ref >60)
GFR calc non Af Amer: >60 mL/min (ref >60)
Glucose, Bld: 132 mg/dL — ABNORMAL HIGH (ref 65–99)
Potassium: 4.1 mmol/L (ref 3.5–5.1)
Sodium: 140 mmol/L (ref 135–145)
Total Bilirubin: 0.5 mg/dL (ref 0.3–1.2)
Total Protein: 7.7 g/dL (ref 6.5–8.1)

## 2017-09-04 LAB — POCT I-STAT TROPONIN I: Troponin i, poc: 0 ng/mL (ref 0.00–0.08)

## 2017-09-04 LAB — URINALYSIS, ROUTINE W REFLEX MICROSCOPIC
Bilirubin Urine: NEGATIVE
Glucose, UA: NEGATIVE mg/dL
Ketones, ur: NEGATIVE mg/dL
Leukocytes, UA: NEGATIVE
Nitrite: NEGATIVE
Protein, ur: NEGATIVE mg/dL
Specific Gravity, Urine: 1.015 (ref 1.005–1.030)
pH: 6 (ref 5.0–8.0)

## 2017-09-04 LAB — CBC
HCT: 36.9 % (ref 36.0–46.0)
Hemoglobin: 12.3 g/dL (ref 12.0–15.0)
MCH: 31.4 pg (ref 26.0–34.0)
MCHC: 33.3 g/dL (ref 30.0–36.0)
MCV: 94.1 fL (ref 78.0–100.0)
Platelets: 340 10*3/uL (ref 150–400)
RBC: 3.92 MIL/uL (ref 3.87–5.11)
RDW: 12.8 % (ref 11.5–15.5)
WBC: 10.6 10*3/uL — ABNORMAL HIGH (ref 4.0–10.5)

## 2017-09-04 LAB — LIPASE, BLOOD: Lipase: 34 U/L (ref 11–51)

## 2017-09-04 LAB — URINALYSIS, MICROSCOPIC (REFLEX)
Bacteria, UA: NONE SEEN
RBC / HPF: NONE SEEN RBC/hpf (ref 0–5)
Squamous Epithelial / LPF: NONE SEEN
WBC, UA: NONE SEEN WBC/hpf (ref 0–5)

## 2017-09-04 LAB — GLUCOSE, CAPILLARY: Glucose-Capillary: 212 mg/dL — ABNORMAL HIGH (ref 65–99)

## 2017-09-04 LAB — I-STAT TROPONIN, ED: Troponin i, poc: 0.01 ng/mL (ref 0.00–0.08)

## 2017-09-04 LAB — TSH: TSH: 1.739 u[IU]/mL (ref 0.350–4.500)

## 2017-09-04 MED ORDER — HEPARIN (PORCINE) IN NACL 100-0.45 UNIT/ML-% IJ SOLN
750.0000 [IU]/h | INTRAMUSCULAR | Status: DC
  Administered 2017-09-04: 650 [IU]/h via INTRAVENOUS
  Filled 2017-09-04: qty 250

## 2017-09-04 MED ORDER — GI COCKTAIL ~~LOC~~
30.0000 mL | Freq: Once | ORAL | Status: AC
  Administered 2017-09-04: 30 mL via ORAL
  Filled 2017-09-04: qty 30

## 2017-09-04 MED ORDER — ONDANSETRON HCL 4 MG/2ML IJ SOLN
4.0000 mg | Freq: Once | INTRAMUSCULAR | Status: AC
  Administered 2017-09-04: 4 mg via INTRAVENOUS
  Filled 2017-09-04: qty 2

## 2017-09-04 MED ORDER — INSULIN ASPART 100 UNIT/ML ~~LOC~~ SOLN
0.0000 [IU] | Freq: Three times a day (TID) | SUBCUTANEOUS | Status: DC
  Administered 2017-09-06: 1 [IU] via SUBCUTANEOUS

## 2017-09-04 MED ORDER — GI COCKTAIL ~~LOC~~: 30.0000 mL | Freq: Once | ORAL | Status: DC

## 2017-09-04 MED ORDER — FAMOTIDINE IN NACL 20-0.9 MG/50ML-% IV SOLN
20.0000 mg | Freq: Once | INTRAVENOUS | Status: AC
  Administered 2017-09-04: 20 mg via INTRAVENOUS
  Filled 2017-09-04: qty 50

## 2017-09-04 MED ORDER — HEPARIN BOLUS VIA INFUSION
2500.0000 [IU] | Freq: Once | INTRAVENOUS | Status: AC
  Administered 2017-09-04: 2500 [IU] via INTRAVENOUS
  Filled 2017-09-04: qty 2500

## 2017-09-04 MED ORDER — GI COCKTAIL ~~LOC~~
30.0000 mL | Freq: Three times a day (TID) | ORAL | Status: DC | PRN
Start: 2017-09-04 — End: 2017-09-06
  Administered 2017-09-04: 30 mL via ORAL
  Filled 2017-09-04: qty 30

## 2017-09-04 MED ORDER — ONDANSETRON HCL 4 MG/2ML IJ SOLN: 4.0000 mg | Freq: Four times a day (QID) | INTRAMUSCULAR | Status: DC | PRN

## 2017-09-04 MED ORDER — INSULIN ASPART 100 UNIT/ML ~~LOC~~ SOLN
0.0000 [IU] | Freq: Every day | SUBCUTANEOUS | Status: DC
  Administered 2017-09-04: 2 [IU] via SUBCUTANEOUS

## 2017-09-04 MED ORDER — ACETAMINOPHEN 325 MG PO TABS: 650.0000 mg | ORAL_TABLET | ORAL | Status: DC | PRN

## 2017-09-04 MED ORDER — SODIUM CHLORIDE 0.9 % IV BOLUS (SEPSIS)
1000.0000 mL | Freq: Once | INTRAVENOUS | Status: AC
  Administered 2017-09-04: 1000 mL via INTRAVENOUS

## 2017-09-04 NOTE — ED Provider Notes (Signed)
Received signout from PA Caccavale. Refer to provider note for full history and physical. Briefly, patient is a 71 year old [place] speaking female presenting with 3 days of epigastric pain and dizziness. Epigastric pain improved after administration of GI cocktail, thought to be GERD. Found to have labile heart rate while in the ED, alternating between bradycardic and tachycardic (30-130 HR) at rest. Has never been evaluated by cardiology. Troponin negative.  Awaiting Cardiology consult and recommendations.   Physical Exam  BP (!) 166/56 (BP Location: Left Arm)   Pulse (!) 127   Temp 98.3 F (36.8 C)   Resp 16   SpO2 100%   Physical Exam  Constitutional: She appears well-developed and well-nourished. No distress.  Resting comfortably in bed, eating meal  HENT:  Head: Normocephalic and atraumatic.  Eyes: Conjunctivae are normal. Right eye exhibits no discharge. Left eye exhibits no discharge.  Neck: No JVD present. No tracheal deviation present.  Cardiovascular: Normal rate, regular rhythm and normal heart sounds.   Pulmonary/Chest: Effort normal and breath sounds normal.  Abdominal: Soft. Bowel sounds are normal. She exhibits no distension. There is no tenderness.  Musculoskeletal: She exhibits no edema.  Neurological: She is alert.  Skin: Skin is warm and dry. No erythema.  Psychiatric: She has a normal mood and affect. Her behavior is normal.  Nursing note and vitals reviewed.   ED Course  Procedures  MDM Patient seen and evaluated by Dr. Duke Salvia with cardiology. Initial recommendation for admission to [facility] with possible transfer to [facility] for pacemaker. Dr. Duke Salvia called and informed me that she would be better suited for initial management at [facility] for cardiology workup and does not require hospitalist as primary admitting. On my evaluation, patient is resting comfortably, tolerating PO, and ambulating without difficulty. She is stable for transfer to [facility] for cardiology workup.        Bennye Alm 09/05/17 0117    Tilden Fossa, MD 09/07/17 1229

## 2017-09-04 NOTE — Consult Note (Signed)
Cardiology Consultation:   Patient ID: Courtney [name]; [id number]; 1945-12-22   Admit date: 09/04/2017 Date of Consult: 09/04/2017  Primary Care Provider: Patient, No Pcp Per Primary Cardiologist: Dr. Oval Linsey (new)   Patient Profile:   Courtney [name] is a 71 y.o. female with a hx of diabetes who is being seen today for the evaluation of chest pain and dizziness at the request of Sophia Caccavale, PA-C.  History of Present Illness:   Patient speaks [place].  History was obtained with translation from her son and grandson.  Courtney [name] was seen in urgent care where she was evaluated for dizziness and epigastric pain.  She was referred to the ED where she was found to have episodes of atrial fibrillation with RVR into the 130s and sinus bradycardia in the 30s.  Her blood pressure was also labile. Courtney [name] has been experiencing intermittent dizziness for years.  On review of her chart she has made these complaints in urgent care dating back to 2014.  She has been noted to be tachycardic in the 100s, though it was sinus with PACs/PVCs, and bradycardic to the 40s.  She was instructed to follow up with cardiology but hasn't been seen.  She notes dizziness upon standing and with exertion.  She also has some exertional abdominal/chest discomfort.  She also reports some chest discomfort when lying down.  For the last several days she has experienced nausea but no emesis.  She has become less active due to these symptoms. She denies lower extremity edema but has 2 pillow orthopnea.  Past Medical History:  Diagnosis Date  . Allergy     History reviewed. No pertinent surgical history.   Home Medications:  Prior to Admission medications   Medication Sig Start Date End Date Taking? Authorizing Provider  Cyanocobalamin (VITAMIN B-12 PO) Take 1 tablet by mouth daily.   Yes [provider]  ibuprofen (ADVIL,MOTRIN) 200 MG tablet Take 400 mg by mouth every 6 (six) hours as needed for mild pain or moderate pain.    Yes [provider]  albuterol (PROVENTIL HFA;VENTOLIN HFA) 108 (90 Base) MCG/ACT inhaler Inhale 2 puffs into the lungs every 4 (four) hours as needed for wheezing or shortness of breath (cough, shortness of breath or wheezing.). Patient not taking: Reported on 09/04/2017 01/18/16   Shawnee Knapp, MD  amoxicillin-clavulanate (AUGMENTIN) 875-125 MG tablet Take 1 tablet by mouth 2 (two) times daily. Patient not taking: Reported on 09/04/2017 01/18/16   Shawnee Knapp, MD  glucose monitoring kit (FREESTYLE) monitoring kit Check cbgs bid as instructed by pcp. 01/24/16   Shawnee Knapp, MD  omeprazole (PRILOSEC) 20 MG capsule Take 1 capsule (20 mg total) by mouth daily. Patient not taking: Reported on 09/04/2017 05/25/16   Shary Decamp, PA-C  predniSONE (DELTASONE) 20 MG tablet Take 2 tablets (40 mg total) by mouth daily with breakfast. Patient not taking: Reported on 09/04/2017 01/18/16   Shawnee Knapp, MD    Inpatient Medications: Scheduled Meds:  Continuous Infusions: . famotidine (PEPCID) IV     PRN Meds:   Allergies:   No Known Allergies  Social History:   Social History   Social History  . Marital status: Single    Spouse name: N/A  . Number of children: N/A  . Years of education: N/A   Occupational History  . Not on file.   Social History Main Topics  . Smoking status: Current Every Day Smoker  . Smokeless tobacco: Not on file  .  Alcohol use Not on file  . Drug use: Unknown  . Sexual activity: Not on file   Other Topics Concern  . Not on file   Social History Narrative  . No narrative on file    Family History:   History reviewed. No pertinent family history.  Courtney [name] does not know of any family history of cardiac disease.  She grew up in [place] and her family did not have access to medical care.   ROS:  Please see the history of present illness.  ROS  A 12 point review of systems was obtained and was negative with exceptions noted in the HPI.   Physical Exam/Data:    Vitals:   09/04/17 0938 09/04/17 1234 09/04/17 1626  BP: (!) 149/49 (!) 166/56 104/61  Pulse: (!) 52 (!) 127 (!) 54  Resp: _0 Temp: 98.3 F (36.8 C)    SpO2: 99% 100% 100%   No intake or output data in the 24 hours ending 09/04/17 1635 There were no vitals filed for this visit. There is no height or weight on file to calculate BMI.   VS:  BP 104/61 (BP Location: Left Arm)   Pulse (!) 54   Temp 98.3 F (36.8 C)   Resp 12   SpO2 100%  , BMI There is no height or weight on file to calculate BMI. GENERAL:  Well appearing HEENT: Pupils equal round and reactive, fundi not visualized, oral mucosa unremarkable NECK:  No jugular venous distention, waveform within normal limits, carotid upstroke brisk and symmetric, no bruits, no thyromegaly LUNGS:  Clear to auscultation bilaterally HEART:  Bradycardic.  Regular rhythm.  PMI not displaced or sustained,S1 and S2 within normal limits, no S3, no S4, no clicks, no rubs, no murmurs ABD:  Flat, positive bowel sounds normal in frequency in pitch, no bruits, no rebound, no guarding, no midline pulsatile mass, no hepatomegaly, no splenomegaly EXT:  2 plus pulses throughout, no edema, no cyanosis no clubbing SKIN:  No rashes no nodules NEURO:  Cranial nerves II through XII grossly intact, motor grossly intact throughout PSYCH:  Cognitively intact, oriented to person place and time  Psych:  Normal affect   EKG:  The EKG was personally reviewed and demonstrates:  Atrial fibrillation.  Rate 126 bpm.  PVC Telemetry:  Telemetry was personally reviewed and demonstrates:  Sinus bradycardia to 30 bpm.  Sinus arrhythmia and sinus rhythm.  Atrial fibrillation to the 130s.   Relevant CV Studies: n/a  Laboratory Data:  Chemistry Recent Labs Lab 09/04/17 1054  NA 140  K 4.1  CL 106  CO2 25  GLUCOSE 132*  BUN 12  CREATININE 0.59  CALCIUM 9.5  GFRNONAA >60  GFRAA >60  ANIONGAP 9     Recent Labs Lab 09/04/17 1054  PROT 7.7   ALBUMIN 4.1  AST 21  ALT 18  ALKPHOS 82  BILITOT 0.5   Hematology Recent Labs Lab 09/04/17 1054  WBC 10.6*  RBC 3.92  HGB 12.3  HCT 36.9  MCV 94.1  MCH 31.4  MCHC 33.3  RDW 12.8  PLT 340   Cardiac EnzymesNo results for input(s): TROPONINI in the last 168 hours.  Recent Labs Lab 09/04/17 1103  TROPIPOC 0.00    BNPNo results for input(s): BNP, PROBNP in the last 168 hours.  DDimer No results for input(s): DDIMER in the last 168 hours.  Radiology/Studies:  Dg Chest 2 View  Result Date: 09/04/2017 CLINICAL DATA:  Dizziness, chest pain, shortness  of breath and bradycardia. EXAM: CHEST  2 VIEW COMPARISON:  05/25/2016 FINDINGS: The heart size and mediastinal contours are within normal limits. There is no evidence of pulmonary edema, consolidation, pneumothorax, nodule or pleural fluid. The visualized skeletal structures are unremarkable. IMPRESSION: No active cardiopulmonary disease. Electronically Signed   By: Aletta Edouard M.D.   On: 09/04/2017 10:42    Assessment and Plan:   # Tachy/brady syndrome: # Paroxysmal atrial fibrillation:  Courtney [name] has symptomatic bradycardia with rates in the 30s.  She also has symptomatic paroxysmal atrial fibrillation.  She has not been on any nodal agents.  She will need a pacemaker to adequately manage her tachycardia.  [facility] will [facility] to [facility] to be evaluated by the EP service.  No nodal agents for now.  We will get an echo and start heparin. Check thyroid function.  # Chest pain: Symptoms have both typical and atypical features.  I suspect that her chest pain is more heart rate related than ischemic.  It is possible that RCA stenosis could be contributing to her bradycardia.  Would favor PPM prior to getting a [employer], but will defer to EP.    # Hypertension: Blood pressure has been labile ranging from the 100s to the 160s.  Continue to monitor.  # Diabetes mellitus type 2: Hgb A1c 8.2% 01/2016.  She hasn't been on any  medications. Will recheck A1c and AC/HS accuchecks.    For questions or updates, please contact [place] Please consult [email or web address] for contact info under Cardiology/STEMI.   Signed, Skeet Latch, MD  09/04/2017 4:35 PM

## 2017-09-04 NOTE — Progress Notes (Signed)
ANTICOAGULATION CONSULT NOTE - Initial Consult  Pharmacy Consult for Heparin Indication: atrial fibrillation  No Known Allergies  Patient Measurements:   Heparin Dosing Weight: 52 kg  Vital Signs: Temp: 98.3 F (36.8 C) (09/23 0938) BP: 104/61 (09/23 1626) Pulse Rate: 54 (09/23 1626)  Labs:  Recent Labs  09/04/17 1054  HGB 12.3  HCT 36.9  PLT 340  CREATININE 0.59    CrCl cannot be calculated (Unknown ideal weight.).   Medical History: Past Medical History:  Diagnosis Date  . Allergy     Medications:  Infusions:  No anticoagulants PTA  Assessment: 71 yo F admitted with epigastric pain, dizziness & chest pain.  Pharmacy consulted to start heparin for Afib.  Baseline CBC WNL.  No bleeding noted.   Goal of Therapy:  Heparin level 0.3-0.7 units/ml Monitor platelets by anticoagulation protocol: Yes   Plan:  Give 2500 units bolus x 1 Start heparin infusion at 650 units/hr Check anti-Xa level in 8 hours and daily while on heparin Continue to monitor H&H and platelets  Takuma Cifelli, [facility] 09/04/2017,5:40 PM

## 2017-09-04 NOTE — ED Triage Notes (Addendum)
Family reports she has been complaining of dizziness and CP for the past 2 days. Went to PCP who sent her here for further evaluation of HR 48. Pt also reports SOB.

## 2017-09-04 NOTE — ED Notes (Signed)
Bed: [facility]1 Expected date:  Expected time:  Means of arrival:  Comments: 

## 2017-09-04 NOTE — H&P (Signed)
Cardiology Consultation:   Patient ID: Courtney [name]; [id number]; 09/04/1946   Admit date: 09/04/2017 Date of Consult: 09/04/2017  Primary Care Provider: Patient, No Pcp Per Primary Cardiologist: Dr. Oval Linsey (new)   Patient Profile:   Courtney [name] is a 71 y.o. female with a hx of diabetes who is being seen today for the evaluation of chest pain and dizziness at the request of Courtney Caccavale, PA-C.  History of Present Illness:   Patient speaks [place].  History was obtained with translation from her son and grandson.  Courtney [name] was seen in urgent care where she was evaluated for dizziness and epigastric pain.  She was referred to the ED where she was found to have episodes of atrial fibrillation with RVR into the 130s and sinus bradycardia in the 30s.  Her blood pressure was also labile. Courtney [name] has been experiencing intermittent dizziness for years.  On review of her chart she has made these complaints in urgent care dating back to 2014.  She has been noted to be tachycardic in the 100s, though it was sinus with PACs/PVCs, and bradycardic to the 40s.  She was instructed to follow up with cardiology but hasn't been seen.  She notes dizziness upon standing and with exertion.  She also has some exertional abdominal/chest discomfort.  She also reports some chest discomfort when lying down.  For the last several days she has experienced nausea but no emesis.  She has become less active due to these symptoms. She denies lower extremity edema but has 2 pillow orthopnea.  Past Medical History:  Diagnosis Date  . Allergy     History reviewed. No pertinent surgical history.   Home Medications:  Prior to Admission medications   Medication Sig Start Date End Date Taking? Authorizing Provider  Cyanocobalamin (VITAMIN B-12 PO) Take 1 tablet by mouth daily.   Yes [provider]  ibuprofen (ADVIL,MOTRIN) 200 MG tablet Take 400 mg by mouth every 6 (six) hours as needed for mild pain or moderate pain.    Yes [provider]  albuterol (PROVENTIL HFA;VENTOLIN HFA) 108 (90 Base) MCG/ACT inhaler Inhale 2 puffs into the lungs every 4 (four) hours as needed for wheezing or shortness of breath (cough, shortness of breath or wheezing.). Patient not taking: Reported on 09/04/2017 01/18/16   Shawnee Knapp, MD  amoxicillin-clavulanate (AUGMENTIN) 875-125 MG tablet Take 1 tablet by mouth 2 (two) times daily. Patient not taking: Reported on 09/04/2017 01/18/16   Shawnee Knapp, MD  glucose monitoring kit (FREESTYLE) monitoring kit Check cbgs bid as instructed by pcp. 01/24/16   Shawnee Knapp, MD  omeprazole (PRILOSEC) 20 MG capsule Take 1 capsule (20 mg total) by mouth daily. Patient not taking: Reported on 09/04/2017 05/25/16   Shary Decamp, PA-C  predniSONE (DELTASONE) 20 MG tablet Take 2 tablets (40 mg total) by mouth daily with breakfast. Patient not taking: Reported on 09/04/2017 01/18/16   Shawnee Knapp, MD    Inpatient Medications: Scheduled Meds: . insulin aspart  0-5 Units Subcutaneous QHS  . [START ON 09/05/2017] insulin aspart  0-9 Units Subcutaneous TID WC   Continuous Infusions:  PRN Meds:   Allergies:   No Known Allergies  Social History:   Social History   Social History  . Marital status: Single    Spouse name: N/A  . Number of children: N/A  . Years of education: N/A   Occupational History  . Not on file.   Social History Main Topics  .  Smoking status: Current Every Day Smoker  . Smokeless tobacco: Not on file  . Alcohol use Not on file  . Drug use: Unknown  . Sexual activity: Not on file   Other Topics Concern  . Not on file   Social History Narrative  . No narrative on file    Family History:   History reviewed. No pertinent family history.  Courtney [name] does not know of any family history of cardiac disease.  She grew up in [place] and her family did not have access to medical care.   ROS:  Please see the history of present illness.  ROS  A 12 point review of systems was  obtained and was negative with exceptions noted in the HPI.   Physical Exam/Data:   Vitals:   09/04/17 0938 09/04/17 1234 09/04/17 1626  BP: (!) 149/49 (!) 166/56 104/61  Pulse: (!) 52 (!) 127 (!) 54  Resp: '16 16 12  '$ Temp: 98.3 F (36.8 C)    SpO2: 99% 100% 100%   No intake or output data in the 24 hours ending 09/04/17 1738 There were no vitals filed for this visit. There is no height or weight on file to calculate BMI.   VS:  BP 104/61 (BP Location: Left Arm)   Pulse (!) 54   Temp 98.3 F (36.8 C)   Resp 12   SpO2 100%  , BMI There is no height or weight on file to calculate BMI. GENERAL:  Well appearing HEENT: Pupils equal round and reactive, fundi not visualized, oral mucosa unremarkable NECK:  No jugular venous distention, waveform within normal limits, carotid upstroke brisk and symmetric, no bruits, no thyromegaly LUNGS:  Clear to auscultation bilaterally HEART:  Bradycardic.  Regular rhythm.  PMI not displaced or sustained,S1 and S2 within normal limits, no S3, no S4, no clicks, no rubs, no murmurs ABD:  Flat, positive bowel sounds normal in frequency in pitch, no bruits, no rebound, no guarding, no midline pulsatile mass, no hepatomegaly, no splenomegaly EXT:  2 plus pulses throughout, no edema, no cyanosis no clubbing SKIN:  No rashes no nodules NEURO:  Cranial nerves II through XII grossly intact, motor grossly intact throughout PSYCH:  Cognitively intact, oriented to person place and time  Psych:  Normal affect   EKG:  The EKG was personally reviewed and demonstrates:  Atrial fibrillation.  Rate 126 bpm.  PVC Telemetry:  Telemetry was personally reviewed and demonstrates:  Sinus bradycardia to 30 bpm.  Sinus arrhythmia and sinus rhythm.  Atrial fibrillation to the 130s.   Relevant CV Studies: n/a  Laboratory Data:  Chemistry  Recent Labs Lab 09/04/17 1054  NA 140  K 4.1  CL 106  CO2 25  GLUCOSE 132*  BUN 12  CREATININE 0.59  CALCIUM 9.5  GFRNONAA  >60  GFRAA >60  ANIONGAP 9     Recent Labs Lab 09/04/17 1054  PROT 7.7  ALBUMIN 4.1  AST 21  ALT 18  ALKPHOS 82  BILITOT 0.5   Hematology  Recent Labs Lab 09/04/17 1054  WBC 10.6*  RBC 3.92  HGB 12.3  HCT 36.9  MCV 94.1  MCH 31.4  MCHC 33.3  RDW 12.8  PLT 340   Cardiac EnzymesNo results for input(s): TROPONINI in the last 168 hours.   Recent Labs Lab 09/04/17 1103 09/04/17 1642  TROPIPOC 0.00 0.01    BNPNo results for input(s): BNP, PROBNP in the last 168 hours.  DDimer No results for input(s): DDIMER in the  last 168 hours.  Radiology/Studies:  Dg Chest 2 View  Result Date: 09/04/2017 CLINICAL DATA:  Dizziness, chest pain, shortness of breath and bradycardia. EXAM: CHEST  2 VIEW COMPARISON:  05/25/2016 FINDINGS: The heart size and mediastinal contours are within normal limits. There is no evidence of pulmonary edema, consolidation, pneumothorax, nodule or pleural fluid. The visualized skeletal structures are unremarkable. IMPRESSION: No active cardiopulmonary disease. Electronically Signed   By: Aletta Edouard M.D.   On: 09/04/2017 10:42    Assessment and Plan:   # Tachy/brady syndrome: # Paroxysmal atrial fibrillation:  Ms. Weismann has symptomatic bradycardia with rates in the 30s.  She also has symptomatic paroxysmal atrial fibrillation.  She has not been on any nodal agents.  She will need a pacemaker to adequately manage her tachycardia.  [facility] will [facility] to [facility] to be evaluated by the EP service.  No nodal agents for now.  We will get an echo and start heparin. Check thyroid function.  # Chest pain: Symptoms have both typical and atypical features.  I suspect that her chest pain is more heart rate related than ischemic.  It is possible that RCA stenosis could be contributing to her bradycardia.  Would favor PPM prior to getting a [employer], but will defer to EP.    # Hypertension: Blood pressure has been labile ranging from the 100s to the  160s.  Continue to monitor.  # Diabetes mellitus type 2: Hgb A1c 8.2% 01/2016.  She hasn't been on any medications. Will recheck A1c and AC/HS accuchecks.    For questions or updates, please contact [place] Please consult [email or web address] for contact info under Cardiology/STEMI.   Signed, Skeet Latch, MD  09/04/2017 5:38 PM

## 2017-09-04 NOTE — ED Notes (Signed)
Family member contact [phone] [place])

## 2017-09-04 NOTE — ED Provider Notes (Signed)
[place] [facility] DEPT Provider Note   CSN: [id number] Arrival date & time: 09/04/17  9371     History   Chief Complaint Chief Complaint  Patient presents with  . Dizziness  . Bradycardia    HPI Courtney [name] is a 71 y.o. female presenting with epigastric pain and dizziness.   Patient's son and grandson acting as translators, as patient speaks [place].  Patient states that she has had issues with epigastric pain on and off for the past several years. The last 3 days have been worse. She describes the epigastric pain as being constant, and burning. At night, pain radiates up her chest. This is not present currently. Due to the amount of pain she is in, she reports decreased oral intake. Oral intake makes her pain worse, nothing has made it better. She's been taking ibuprofen to try to help with the pain, but this has not improved it. She reports associated nausea without vomiting. She denies fever, chills, sore throat, cough, shortness of breath, lower abdominal pain, urinary symptoms, or abnormal bowel movements. She has a history of GERD, for which she was treated with multiple medicines a year ago, which relieved her symptoms. She is not on a PPI or H2. She does not have a primary care doctor, but goes to urgent care for primary care needs. Patient additionally reports dizziness, described as the room spinning, while she goes from sitting to standing. While she is sitting still, she does not have symptoms. This is not present when she moves her head from one side to the other.  Per chart review, patient has a history of diabetes. This was diagnosed a year ago at urgent care. She was supposed to follow-up for glucose monitoring and possibly starting medications, but this never happened. Patient is not checking her blood sugars at home, does not take medicine for diabetes. Additionally, patient has been referred to cardiology due to low heart rate. She has never followed up.   Patient sent from  urgent care due to bradycardia at 49. Per chart review, patient has been in the 40s to 60s multiple times. She denies palpitations  HPI  Past Medical History:  Diagnosis Date  . Allergy     Patient Active Problem List   Diagnosis Date Noted  . Type 2 diabetes mellitus with hyperglycemia, without long-term current use of insulin ([place]) 01/18/2016  . Chronic obstructive pulmonary disease ([place]) 01/18/2016  . Tobacco use disorder 01/18/2016  . Overweight (BMI 25.0-29.9) 01/18/2016    History reviewed. No pertinent surgical history.  OB History    No data available       Home Medications    Prior to Admission medications   Medication Sig Start Date End Date Taking? Authorizing Provider  Cyanocobalamin (VITAMIN B-12 PO) Take 1 tablet by mouth daily.   Yes [provider]  ibuprofen (ADVIL,MOTRIN) 200 MG tablet Take 400 mg by mouth every 6 (six) hours as needed for mild pain or moderate pain.   Yes [provider]  albuterol (PROVENTIL HFA;VENTOLIN HFA) 108 (90 Base) MCG/ACT inhaler Inhale 2 puffs into the lungs every 4 (four) hours as needed for wheezing or shortness of breath (cough, shortness of breath or wheezing.). Patient not taking: Reported on 09/04/2017 01/18/16   Shawnee Knapp, MD  amoxicillin-clavulanate (AUGMENTIN) 875-125 MG tablet Take 1 tablet by mouth 2 (two) times daily. Patient not taking: Reported on 09/04/2017 01/18/16   Shawnee Knapp, MD  glucose monitoring kit (FREESTYLE) monitoring kit Check cbgs  bid as instructed by pcp. 01/24/16   Shawnee Knapp, MD  omeprazole (PRILOSEC) 20 MG capsule Take 1 capsule (20 mg total) by mouth daily. Patient not taking: Reported on 09/04/2017 05/25/16   Shary Decamp, PA-C  predniSONE (DELTASONE) 20 MG tablet Take 2 tablets (40 mg total) by mouth daily with breakfast. Patient not taking: Reported on 09/04/2017 01/18/16   Shawnee Knapp, MD    Family History History reviewed. No pertinent family history.  Social History Social  History  Substance Use Topics  . Smoking status: Current Every Day Smoker  . Smokeless tobacco: Not on file  . Alcohol use Not on file     Allergies   Patient has no known allergies.   Review of Systems Review of Systems  Cardiovascular: Positive for chest pain.  Gastrointestinal: Positive for abdominal pain and nausea.  Neurological: Positive for dizziness.  All other systems reviewed and are negative.    Physical Exam Updated Vital Signs BP 104/61 (BP Location: Left Arm)   Pulse (!) 54   Temp 98.3 F (36.8 C)   Resp 12   SpO2 100%   Physical Exam  Constitutional: She is oriented to person, place, and time. She appears well-developed and well-nourished. No distress.  HENT:  Head: Normocephalic and atraumatic.  Eyes: EOM are normal.  Neck: Normal range of motion.  Cardiovascular: Intact distal pulses.  An irregularly irregular rhythm present.  Heart rate ranging between 35 and 135. It is irregular. Rate change happens all patient is sitting still, breathing normally, and not in distress.  Pulmonary/Chest: Effort normal and breath sounds normal. No respiratory distress. She has no decreased breath sounds. She has no wheezes. She has no rhonchi. She has no rales.  Abdominal: Soft. Normal appearance and bowel sounds are normal. She exhibits no distension. There is tenderness in the epigastric area. There is no rigidity, no rebound, no guarding, no tenderness at McBurney's point and negative Murphy's sign.  Tenderness to palpation of epigastric abdomen, with radiation to the back. No rigidity or distention. No pain elsewhere in the abdomen  Musculoskeletal: Normal range of motion.  Strength intact 4. Moving all extremities appropriately. Sensation intact 4.  Neurological: She is alert and oriented to person, place, and time.  Skin: Skin is warm and dry.  Psychiatric: She has a normal mood and affect.  Nursing note and vitals reviewed.    ED Treatments / Results   Labs (all labs ordered are listed, but only abnormal results are displayed) Labs Reviewed  CBC - Abnormal; Notable for the following:       Result Value   WBC 10.6 (*)    All other components within normal limits  URINALYSIS, ROUTINE W REFLEX MICROSCOPIC - Abnormal; Notable for the following:    Hgb urine dipstick TRACE (*)    All other components within normal limits  COMPREHENSIVE METABOLIC PANEL - Abnormal; Notable for the following:    Glucose, Bld 132 (*)    All other components within normal limits  LIPASE, BLOOD  TSH  URINALYSIS, MICROSCOPIC (REFLEX)  I-STAT TROPONIN, ED  POCT I-STAT TROPONIN I  I-STAT TROPONIN, ED    EKG  EKG Interpretation  Date/Time:  Sunday September 04 2017 09:43:51 EDT Ventricular Rate:  50 PR Interval:    QRS Duration: 97 QT Interval:  502 QTC Calculation: 458 R Axis:   71 Text Interpretation:  Sinus rhythm Borderline short PR interval Consider left atrial enlargement Sinus bradycardia Confirmed by Davonna Belling [phone]) on 09/04/2017  9:49:14 AM       Radiology Dg Chest 2 View  Result Date: 09/04/2017 CLINICAL DATA:  Dizziness, chest pain, shortness of breath and bradycardia. EXAM: CHEST  2 VIEW COMPARISON:  05/25/2016 FINDINGS: The heart size and mediastinal contours are within normal limits. There is no evidence of pulmonary edema, consolidation, pneumothorax, nodule or pleural fluid. The visualized skeletal structures are unremarkable. IMPRESSION: No active cardiopulmonary disease. Electronically Signed   By: Aletta Edouard M.D.   On: 09/04/2017 10:42    Procedures Procedures (including critical care time)  Medications Ordered in ED Medications  famotidine (PEPCID) IVPB 20 mg premix (20 mg Intravenous New Bag/Given 09/04/17 1641)  sodium chloride 0.9 % bolus 1,000 mL (0 mLs Intravenous Stopped 09/04/17 1414)  ondansetron (ZOFRAN) injection 4 mg (4 mg Intravenous Given 09/04/17 1236)  gi cocktail (Maalox,Lidocaine,Donnatal) (30 mLs  Oral Given 09/04/17 1236)     Initial Impression / Assessment and Plan / ED Course  I have reviewed the triage vital signs and the nursing notes.  Pertinent labs & imaging results that were available during my care of the patient were reviewed by me and considered in my medical decision making (see chart for details).     Patient presenting with epigastric pain and dizziness for the past several days. Was seen by UC, who sent her to ED for evaluation of her heart, as heart rate was 48. Physical exam shows heart rate ranging between 30 and 130. It is irregular. Blood pressure is stable. Patient with tenderness of the epigastrium. As she is afebrile, has worse pain after eating, and history of GERD for which she is not taking anything, epigastric pain is likely GERD related. Will order basic abdominal labs, troponin, TSH, and UA for further evaluation. Discussed case with attending, and Dr. Alvino Chapel evaluated the pt.   CMP reassuring, CBC shows slight leukocytosis. Lipase negative. Troponin negative. UA negative for infection. Chest x-ray negative for infiltrate. Initial EKG shows sinus bradycardia. TSH within normal limits. At this time, doubt epigastric pain is due to cardiac or pulmonary cause. Likely GERD. Will give GI cocktail for symptom relief.  Concern about patient's heart rate. Patient has never had cardiology follow-up. Discussed case with cardiology, cardiology to evaluate the patient.   On reassessment, patient states pain is completely resolved by GI cocktail. Glucose stable at 130s.  On reassessment, patient states epigastric pain is starting to return. Will order Pepcid for symptom relief.  Patient signed out to Amada Kingfisher, PA-C for further management until pt is admitted.   Patient to be admitted under hospitalist, with cardiology following.    Final Clinical Impressions(s) / ED Diagnoses   Final diagnoses:  Irregular heart beat  Gastroesophageal reflux disease, esophagitis  presence not specified    New Prescriptions New Prescriptions   No medications on file     Franchot Heidelberg, Hershal Coria 09/04/17 1655    Davonna Belling, MD 09/06/17 [phone]

## 2017-09-05 ENCOUNTER — Encounter (HOSPITAL_COMMUNITY): Payer: Self-pay | Admitting: Physician Assistant

## 2017-09-05 ENCOUNTER — Inpatient Hospital Stay (HOSPITAL_COMMUNITY): Payer: Medicare HMO

## 2017-09-05 ENCOUNTER — Encounter (HOSPITAL_COMMUNITY)
Admission: EM | Disposition: A | Discharge status: Home / Self Care | Payer: Self-pay | Source: Home / Self Care | Attending: Cardiovascular Disease

## 2017-09-05 DIAGNOSIS — I5030 Unspecified diastolic (congestive) heart failure: Secondary | ICD-10-CM

## 2017-09-05 DIAGNOSIS — I495 Sick sinus syndrome: Secondary | ICD-10-CM

## 2017-09-05 HISTORY — PX: PACEMAKER IMPLANT: EP1218

## 2017-09-05 LAB — CBC
HCT: 33.5 % — ABNORMAL LOW (ref 36.0–46.0)
Hemoglobin: 10.8 g/dL — ABNORMAL LOW (ref 12.0–15.0)
MCH: 30.4 pg (ref 26.0–34.0)
MCHC: 32.2 g/dL (ref 30.0–36.0)
MCV: 94.4 fL (ref 78.0–100.0)
Platelets: 309 10*3/uL (ref 150–400)
RBC: 3.55 MIL/uL — ABNORMAL LOW (ref 3.87–5.11)
RDW: 12.7 % (ref 11.5–15.5)
WBC: 10.9 10*3/uL — ABNORMAL HIGH (ref 4.0–10.5)

## 2017-09-05 LAB — HEPARIN LEVEL (UNFRACTIONATED)
Heparin Unfractionated: 0.25 IU/mL — ABNORMAL LOW (ref 0.30–0.70)
Heparin Unfractionated: 0.39 IU/mL (ref 0.30–0.70)

## 2017-09-05 LAB — GLUCOSE, CAPILLARY
Glucose-Capillary: 116 mg/dL — ABNORMAL HIGH (ref 65–99)
Glucose-Capillary: 127 mg/dL — ABNORMAL HIGH (ref 65–99)
Glucose-Capillary: 161 mg/dL — ABNORMAL HIGH (ref 65–99)

## 2017-09-05 LAB — ECHOCARDIOGRAM COMPLETE
Height: 59 in
Weight: 1837.75 oz

## 2017-09-05 SURGERY — PACEMAKER IMPLANT
Anesthesia: LOCAL

## 2017-09-05 MED ORDER — SODIUM CHLORIDE 0.9 % IV SOLN
INTRAVENOUS | Status: DC
  Administered 2017-09-05: 15:00:00 via INTRAVENOUS

## 2017-09-05 MED ORDER — LIDOCAINE HCL (PF) 1 % IJ SOLN
INTRAMUSCULAR | Status: DC | PRN
  Administered 2017-09-05: 50 mL via SUBCUTANEOUS

## 2017-09-05 MED ORDER — SODIUM CHLORIDE 0.9 % IV SOLN: INTRAVENOUS | Status: AC

## 2017-09-05 MED ORDER — CEFAZOLIN SODIUM-DEXTROSE 2-4 GM/100ML-% IV SOLN
2.0000 g | INTRAVENOUS | Status: AC
  Administered 2017-09-05: 2 g via INTRAVENOUS

## 2017-09-05 MED ORDER — ATROPINE SULFATE 1 MG/10ML IJ SOSY
PREFILLED_SYRINGE | INTRAMUSCULAR | Status: AC
  Filled 2017-09-05: qty 10

## 2017-09-05 MED ORDER — LIDOCAINE HCL (PF) 1 % IJ SOLN
INTRAMUSCULAR | Status: AC
  Filled 2017-09-05: qty 60

## 2017-09-05 MED ORDER — CEFAZOLIN SODIUM-DEXTROSE 1-4 GM/50ML-% IV SOLN
1.0000 g | Freq: Four times a day (QID) | INTRAVENOUS | Status: AC
  Administered 2017-09-05 – 2017-09-06 (×3): 1 g via INTRAVENOUS
  Filled 2017-09-05 (×3): qty 50

## 2017-09-05 MED ORDER — ONDANSETRON HCL 4 MG/2ML IJ SOLN: 4.0000 mg | Freq: Four times a day (QID) | INTRAMUSCULAR | Status: DC | PRN

## 2017-09-05 MED ORDER — METOPROLOL TARTRATE 50 MG PO TABS
50.0000 mg | ORAL_TABLET | Freq: Two times a day (BID) | ORAL | Status: DC
  Administered 2017-09-05 – 2017-09-06 (×2): 50 mg via ORAL
  Filled 2017-09-05 (×2): qty 1

## 2017-09-05 MED ORDER — ACETAMINOPHEN 325 MG PO TABS
325.0000 mg | ORAL_TABLET | ORAL | Status: DC | PRN
  Administered 2017-09-05: 650 mg via ORAL
  Filled 2017-09-05: qty 2

## 2017-09-05 MED ORDER — HEPARIN (PORCINE) IN NACL 2-0.9 UNIT/ML-% IJ SOLN
INTRAMUSCULAR | Status: AC | PRN
  Administered 2017-09-05: 500 mL

## 2017-09-05 MED ORDER — HEPARIN (PORCINE) IN NACL 2-0.9 UNIT/ML-% IJ SOLN
INTRAMUSCULAR | Status: AC
  Filled 2017-09-05: qty 500

## 2017-09-05 MED ORDER — SODIUM CHLORIDE 0.9 % IR SOLN
Status: AC
  Filled 2017-09-05: qty 2

## 2017-09-05 MED ORDER — CEFAZOLIN SODIUM-DEXTROSE 2-4 GM/100ML-% IV SOLN
INTRAVENOUS | Status: AC
  Filled 2017-09-05: qty 100

## 2017-09-05 MED ORDER — SODIUM CHLORIDE 0.9 % IR SOLN
80.0000 mg | Status: AC
  Administered 2017-09-05: 80 mg

## 2017-09-05 SURGICAL SUPPLY — 8 items
CABLE SURGICAL S-101-97-12 (CABLE) ×2 IMPLANT
HEMOSTAT SURGICEL 2X4 FIBR (HEMOSTASIS) ×2 IMPLANT
LEAD ISOFLEX OPT 1944-46CM (Lead) ×2 IMPLANT
LEAD ISOFLEX OPT 1948-52CM (Lead) ×2 IMPLANT
PACEMAKER ASSURITY DR-RF (Pacemaker) ×2 IMPLANT
PAD DEFIB LIFELINK (PAD) ×2 IMPLANT
SHEATH CLASSIC 8F (SHEATH) ×4 IMPLANT
TRAY PACEMAKER INSERTION (PACKS) ×2 IMPLANT

## 2017-09-05 NOTE — Progress Notes (Signed)
Orthopedic Tech Progress Note Patient Details:  Courtney [name] 10/12/1946 [id number] Patient has arm sling on. Patient ID: Courtney [name], female   DOB: [birth year], 71 y.o.   MRN: [id number]   Courtney [name] 09/05/2017, 6:35 PM

## 2017-09-05 NOTE — Progress Notes (Signed)
Progress Note  Patient Name: Courtney [name] Date of Encounter: 09/05/2017  Primary Cardiologist: Dr. Duke [name] (new)  Subjective   Unable to obtain.  Family not at bedside.  No interpreter in room while getting echo.   Inpatient Medications    Scheduled Meds: . atropine      . insulin aspart  0-5 Units Subcutaneous QHS  . insulin aspart  0-9 Units Subcutaneous TID WC   Continuous Infusions: . heparin 750 Units/hr (09/05/17 0344)   PRN Meds: acetaminophen, gi cocktail, ondansetron (ZOFRAN) IV   Vital Signs    Vitals:   09/04/17 1930 09/04/17 2052 09/05/17 0500 09/05/17 0559  BP: (!) 122/58 (!) 147/60  (!) 146/63  Pulse: (!) 52 (!) 55  (!) 52  Resp: 20 18    Temp:  98.6 F (37 C)  98.7 F (37.1 C)  TempSrc:  Oral  Oral  SpO2: 97% 99%  100%  Weight:  52.2 kg (115 lb 1.3 oz) 52.1 kg (114 lb 13.8 oz)   Height:   (1.499 m)      Intake/Output Summary (Last 24 hours) at 09/05/17 1117 Last data filed at 09/05/17 0300  Gross per 24 hour  Intake           106.44 ml  Output                0 ml  Net           106.44 ml   Filed Weights   09/04/17 1745 09/04/17 2052 09/05/17 0500  Weight: 53.1 kg (117 lb) 52.2 kg (115 lb 1.3 oz) 52.1 kg (114 lb 13.8 oz)    Telemetry    Sinus rhtyhm.  Sinus bradycardia.  Episode of atrial fibrillation.  - Personally Reviewed  ECG    Sinus bradycardia.  Sinus arrhythmia. Rate 50bpm.   - Personally Reviewed  Physical Exam   GEN: No acute distress.   Neck: No JVD Cardiac: Bradycardic. no murmurs, rubs, or gallops.  Respiratory: Clear to auscultation bilaterally. GI: Soft, nontender, non-distended  MS: No edema; No deformity. Neuro:  Nonfocal  Psych: Normal affect   Labs    Chemistry Recent Labs Lab 09/04/17 1054  NA 140  K 4.1  CL 106  CO2 25  GLUCOSE 132*  BUN 12  CREATININE 0.59  CALCIUM 9.5  PROT 7.7  ALBUMIN 4.1  AST 21  ALT 18  ALKPHOS 82  BILITOT 0.5  GFRNONAA >60  GFRAA >60  ANIONGAP 9      Hematology Recent Labs Lab 09/04/17 1054 09/05/17 0207  WBC 10.6* 10.9*  RBC 3.92 3.55*  HGB 12.3 10.8*  HCT 36.9 33.5*  MCV 94.1 94.4  MCH 31.4 30.4  MCHC 33.3 32.2  RDW 12.8 12.7  PLT 340 309    Cardiac EnzymesNo results for input(s): TROPONINI in the last 168 hours.  Recent Labs Lab 09/04/17 1103 09/04/17 1642  TROPIPOC 0.00 0.01     BNPNo results for input(s): BNP, PROBNP in the last 168 hours.   DDimer No results for input(s): DDIMER in the last 168 hours.   Radiology    Dg Chest 2 View  Result Date: 09/04/2017 CLINICAL DATA:  Dizziness, chest pain, shortness of breath and bradycardia. EXAM: CHEST  2 VIEW COMPARISON:  05/25/2016 FINDINGS: The heart size and mediastinal contours are within normal limits. There is no evidence of pulmonary edema, consolidation, pneumothorax, nodule or pleural fluid. The visualized skeletal structures are unremarkable. IMPRESSION: No active cardiopulmonary disease. Electronically  Signed   By: Courtney [name] M.D.   On: 09/04/2017 10:42    Cardiac Studies   Echo pending  Patient Profile     71 y.o. female with diabetes here with chest pain and dizziness.  Found to be in paroxysmal atrial fibrillation with RVR and also bradycardia to the 30s while awake.   Assessment & Plan    # Tachy/brady syndrome: # Paroxysmal atrial fibrillation:  Courtney [name] continues to have episodes of bradycardia.  She also had a short run of atrial fibrillation.  Awaiting EP consult.  Echo is currently being performed and systolic function appears normal on first review. TSH 1.74.  No nodal agents.   # Chest pain: Symptoms have both typical and atypical features.  I suspect that her chest pain is more heart rate related than ischemic.  It is possible that RCA stenosis could be contributing to her bradycardia.  Would favor PPM prior to getting a [employer], but will defer to EP.    # Hypertension: SBP ranging from the 120s-140s.  Will address after  ppm.   For questions or updates, please contact [facility] Please consult [email or web address] for contact info under Cardiology/STEMI.      Signed, Courtney Si, MD  09/05/2017, 11:17 AM

## 2017-09-05 NOTE — Progress Notes (Signed)
  Echocardiogram 2D Echocardiogram has been performed.  Courtney [name] [name] 09/05/2017, 12:44 PM

## 2017-09-05 NOTE — Consult Note (Signed)
Cardiology Consultation:   Patient ID: Courtney [name]; [id number]; March 17, 1946   Admit date: 09/04/2017 Date of Consult: 09/05/2017  Primary Care Provider: Patient, No Pcp Per Primary Cardiologist: [facility] to [facility], Dr. Duke Salvia   Patient Profile:   Courtney [name] is a 71 y.o. female with a hx of DM, long standing heavy tobacco user, new paroxysmal AFib who is being seen today for the evaluation of possible tachy-brady syndrome and evaluation for ppm at the request of Dr. Duke Salvia.  History of Present Illness:   Courtney [name] with complaints of dizziness, episgastric discomfort, nausea, found to have episodes of AFib w/RVR in the ER as well as HR 30's.  No reports of syncope, her dizziness seems to correlate with episodes of tachycardia   not the slower rates.  She has had palpitations/dizziness, on/off for some time, as well as chest discomfort With these episodes. They have been going on for about one year.  LABS K+ 4.1 BUN/Creat 12/0.59 WBC 10.9 H/H 10/33 plts 309 TSH 1.739  Thromboembolic risk factors ( age -73, HTN-4,  DM-1, Gender-1) for a CHADSVASc Score of 4   The patient is seen in conjunction with Dr. Graciela Husbands using [facility] service, [place]  Past Medical History:  Diagnosis Date  . Allergy     History reviewed. No pertinent surgical history.     Inpatient Medications: Scheduled Meds: . atropine      . insulin aspart  0-5 Units Subcutaneous QHS  . insulin aspart  0-9 Units Subcutaneous TID WC   Continuous Infusions:  PRN Meds: acetaminophen, gi cocktail, ondansetron (ZOFRAN) IV  Allergies:   No Known Allergies  Social History:   Social History   Social History  . Marital status: Single    Spouse name: N/A  . Number of children: N/A  . Years of education: N/A   Occupational History  . Not on file.   Social History Main Topics  . Smoking status: Current Every Day Smoker  . Smokeless tobacco: Not on file  . Alcohol use Not on file  . Drug use: Unknown  .  Sexual activity: Not on file   Other Topics Concern  . Not on file   Social History Narrative  . No narrative on file    Family History:    Family History  Problem Relation Age of Onset  . Diabetes Son      ROS:  Please see the history of present illness.  ROS  All other ROS reviewed and negative.     Physical Exam/Data:   Vitals:   09/04/17 1930 09/04/17 2052 09/05/17 0500 09/05/17 0559  BP: (!) 122/58 (!) 147/60  (!) 146/63  Pulse: (!) 52 (!) 55  (!) 52  Resp: 20 18    Temp:  98.6 F (37 C)  98.7 F (37.1 C)  TempSrc:  Oral  Oral  SpO2: 97% 99%  100%  Weight:  115 lb 1.3 oz (52.2 kg) 114 lb 13.8 oz (52.1 kg)   Height:   (1.499 m)      Intake/Output Summary (Last 24 hours) at 09/05/17 1309 Last data filed at 09/05/17 0300  Gross per 24 hour  Intake           106.44 ml  Output                0 ml  Net           106.44 ml   Filed Weights   09/04/17 1745 09/04/17  2052 09/05/17 0500  Weight: 117 lb (53.1 kg) 115 lb 1.3 oz (52.2 kg) 114 lb 13.8 oz (52.1 kg)   Body mass index is 23.2 kg/m.  General:  Well nourished, well developed, in no acute distress HEENT: normal Lymph: no adenopathy Neck: no JVD Endocrine:  No thryomegaly Vascular: No carotid bruits; FA pulses 2+ bilaterally without bruits  Cardiac:   RRR; no murmurs, gallops or rubs Alternating with spells of rapid irregular rhythm Lungs:  CTA b/l,  no wheezing, rhonchi or rales  Abd: soft, nontender, no hepatomegaly  Ext: no edema Musculoskeletal:  No deformities, BUE and BLE strength normal and equal Skin: warm and dry  Neuro:  CNs 2-12 intact, no focal abnormalities noted Psych:  Normal affect   EKG:  The EKG was personally reviewed and demonstrates:   Initial is SB, 50bpm, PR , QRS 97ms, QTc 458 #2 is AFib 126bpm Old 05/25/16 is SR, PVCs Telemetry:  Telemetry was personally reviewed and demonstrates:  SB high 30's-40's as well as AFib 120's  Relevant CV Studies:  Echo is ordered  and pending  Laboratory Data:  Chemistry  Recent Labs Lab 09/04/17 1054  NA 140  K 4.1  CL 106  CO2 25  GLUCOSE 132*  BUN 12  CREATININE 0.59  CALCIUM 9.5  GFRNONAA >60  GFRAA >60  ANIONGAP 9     Recent Labs Lab 09/04/17 1054  PROT 7.7  ALBUMIN 4.1  AST 21  ALT 18  ALKPHOS 82  BILITOT 0.5   Hematology  Recent Labs Lab 09/04/17 1054 09/05/17 0207  WBC 10.6* 10.9*  RBC 3.92 3.55*  HGB 12.3 10.8*  HCT 36.9 33.5*  MCV 94.1 94.4  MCH 31.4 30.4  MCHC 33.3 32.2  RDW 12.8 12.7  PLT 340 309   Cardiac EnzymesNo results for input(s): TROPONINI in the last 168 hours.   Recent Labs Lab 09/04/17 1103 09/04/17 1642  TROPIPOC 0.00 0.01    BNPNo results for input(s): BNP, PROBNP in the last 168 hours.  DDimer No results for input(s): DDIMER in the last 168 hours.  Radiology/Studies:  Dg Chest 2 View Result Date: 09/04/2017 CLINICAL DATA:  Dizziness, chest pain, shortness of breath and bradycardia. EXAM: CHEST  2 VIEW COMPARISON:  05/25/2016 FINDINGS: The heart size and mediastinal contours are within normal limits. There is no evidence of pulmonary edema, consolidation, pneumothorax, nodule or pleural fluid. The visualized skeletal structures are unremarkable. IMPRESSION: No active cardiopulmonary disease. Electronically Signed   By: Courtney [name] M.D.   On: 09/04/2017 10:42    Assessment and Plan:   1. Tacy-brady syndrome     Recommend PPM implant     Pacemaker procedure, risks and benefits were discussed with the patient and granddaughter at bedside, the patient is agreeable to proceed  2. New Afib     CHA2DS2Vasc is 3, started on heparin gtt here, held for pacer  3. CP     Neg Trop, associated with RVR     W/u deferred to primary cardiologist   For questions or updates, please contact [facility] Please consult [email or web address] for contact info under Cardiology/STEMI.   Signed, Sheilah Pigeon, PA-C  09/05/2017 1:09 PM  Atrial  fibrillation-paroxysmal with short bursts of atrial tachycardia with rapid ventricular response  Bradycardia-sinus/junctional  Hypertension  Diabetes  Tobacco abuse  Patient has evidence of tachybradycardia syndrome. Rate control required drugs almost certainly aggravate her sinus bradycardia. Even antiarrhythmic therapy would either require adjunctive AV nodal blockade (1C) aggravate bradycardia (  amiodarone) or precluded because of long QTc (Tikosyn)  Hence, we have discussed the role of pacing for backup bradycardia protection and medical therapy for tachycardia. I reviewed risks and benefits with the patient with translators (family/[employer] interpreters) she is agreeable and willing to proceed.  Her heparin was discontinued before noon. We will wait until after 1600 hrs. to proceed  We will then begin anticoagulation

## 2017-09-05 NOTE — Progress Notes (Signed)
ANTICOAGULATION CONSULT NOTE - Follow Up Consult  Pharmacy Consult for heparin Indication: atrial fibrillation  Labs:  Recent Labs  09/04/17 1054 09/05/17 0207  HGB 12.3 10.8*  HCT 36.9 33.5*  PLT 340 309  HEPARINUNFRC  --  0.25*  CREATININE 0.59  --     Assessment: 71yo female subtherapeutic on heparin with initial dosing for Afib.  Goal of Therapy:  Heparin level 0.3-0.7 units/ml   Plan:  Will increase heparin gtt by 2 units/kg/hr to 750 units/hr and check level in 8hr.  Vernard Gambles, PharmD, BCPS  09/05/2017,3:29 AM

## 2017-09-06 ENCOUNTER — Encounter (HOSPITAL_COMMUNITY): Payer: Self-pay | Admitting: Internal Medicine

## 2017-09-06 ENCOUNTER — Inpatient Hospital Stay (HOSPITAL_COMMUNITY): Payer: Medicare HMO

## 2017-09-06 LAB — HEMOGLOBIN A1C
Hgb A1c MFr Bld: 7.3 % — ABNORMAL HIGH (ref 4.8–5.6)
Mean Plasma Glucose: 162.81 mg/dL

## 2017-09-06 LAB — CBC
HCT: 36.7 % (ref 36.0–46.0)
Hemoglobin: 11.9 g/dL — ABNORMAL LOW (ref 12.0–15.0)
MCH: 30.7 pg (ref 26.0–34.0)
MCHC: 32.4 g/dL (ref 30.0–36.0)
MCV: 94.6 fL (ref 78.0–100.0)
Platelets: 327 10*3/uL (ref 150–400)
RBC: 3.88 MIL/uL (ref 3.87–5.11)
RDW: 13.1 % (ref 11.5–15.5)
WBC: 10.2 10*3/uL (ref 4.0–10.5)

## 2017-09-06 LAB — GLUCOSE, CAPILLARY: Glucose-Capillary: 128 mg/dL — ABNORMAL HIGH (ref 65–99)

## 2017-09-06 MED ORDER — ACETAMINOPHEN 325 MG PO TABS: 325.0000 mg | ORAL_TABLET | Freq: Four times a day (QID) | ORAL | Status: DC | PRN

## 2017-09-06 MED ORDER — APIXABAN 5 MG PO TABS: 5.0000 mg | ORAL_TABLET | Freq: Two times a day (BID) | ORAL | 6 refills | Status: DC

## 2017-09-06 MED ORDER — ALBUTEROL SULFATE HFA 108 (90 BASE) MCG/ACT IN AERS: 2.0000 | INHALATION_SPRAY | RESPIRATORY_TRACT | 0 refills | Status: DC | PRN

## 2017-09-06 MED ORDER — METOPROLOL TARTRATE 50 MG PO TABS: 50.0000 mg | ORAL_TABLET | Freq: Two times a day (BID) | ORAL | 6 refills | Status: DC

## 2017-09-06 NOTE — Discharge Summary (Signed)
   ELECTROPHYSIOLOGY PROCEDURE DISCHARGE SUMMARY    Patient ID: Courtney [name],  MRN: [id number], DOB/AGE: 70/30/1947 70 y.o.  Admit date: 09/04/2017 Discharge date: 09/06/2017  Primary Care Physician: Patient, No Pcp Per  Primary Cardiologist: Dr. Formoso (new) Electrophysiologist: Dr. Klein (new)  Primary Discharge Diagnosis:  1. Tachy-brady syndrome 2. Paroxysmal AFib     CHA2DS2Vasc is 2 for age/female, will initiate Eliquid for a/c at wound check visit  Secondary Discharge Diagnosis:  None  No Known Allergies   Procedures This Admission:  1.  Implantation of a SJM dual chamber PPM on 08/05/17 by Dr Klein.  The patient received a St Jude 1947 ventricular lead, serial number [id number] and a St Jude atrial lead, serial number [id number], St Jude pulse generator. Serial number [id number] There were no immediate post procedure complications. 2.  CXR on 08/06/17 demonstrated no pneumothorax status post device implantation.   Brief HPI: Courtney [name] is a 71 y.o. female was admitted with c/o dizzy spells, epigastric discomfort, palpitations, she was observed to have both rapid AFib episodes and SB, she was admitted for further evaluation.    Hospital Course:  The patient was admitted and in further evaluation appears to have been suffering with these symptoms for some years, until now without clear findings. Her Trop were negative x3, her symptoms were associated particularly with RVR.  Her echo noted normal LVEF, no WMA, no significant VHD.  EP was consulted recommending pacing prior to any ischemic evaluation.  It was also observed that her BS had been on the higher side without a known dx of DM.  She underwent implantation of a PPM with details as outlined above.  She was monitored on telemetry overnight which demonstrated APacing/V sensing.  Left chest was without hematoma or ecchymosis.  The device was interrogated and found to be functioning normally.  CXR was obtained and demonstrated no  pneumothorax status post device implantation.  The patient reports she does in-fact have a PMD, the name of which escapes her this AM, though was discussed to call for follow up there to discuss her BS and investigation of this.  She denies ever being told she had or was early DM.  HgbA1c was ordered, to be f/u on out patient.  Routine post PPM implant follow up appointments have been arranged as well as general cardiology.  Wound care, arm mobility, and restrictions were reviewed with the patient.  The patient was examined by Dr. Klein and considered stable for discharge to home. The patient was instructed on her new prescriptions, including Eliquis, this will not be started until her wound check visit if site remained stable.  The patient denied any active prescribed medicines, these old Rx were removed from her list  Today's visit, including care instructions, follow up and medicine information was done with the aid of translation service, TAM #[id number]   Physical Exam: Vitals:   09/05/17 2357 09/06/17 0306 09/06/17 0615 09/06/17 0808  BP: (!) 152/82 (!) 131/59 (!) 151/71 (!) 154/62  Pulse: 74 60 60 67  Resp: 15 15 16   Temp: 98 F (36.7 C) 98.4 F (36.9 C) 98.4 F (36.9 C) 98.4 F (36.9 C)  TempSrc: Oral Oral Oral Oral  SpO2: 99% 98% 98% 99%  Weight:  107 lb 9.6 oz (48.8 kg) 115 lb 8 oz (52.4 kg)   Height:        GEN- The patient is well appearing, alert and oriented x 3 today.   HEENT: normocephalic,   atraumatic; sclera clear, conjunctiva pink; hearing intact; oropharynx clear; neck supple, no JVP Lungs- CTA b/l, normal work of breathing.  No wheezes, rales, rhonchi Heart- RRR, no murmurs, rubs or gallops, PMI not laterally displaced GI- soft, non-tender, non-distended Extremities- no clubbing, cyanosis, or edema MS- no significant deformity or atrophy Skin- warm and dry, no rash or lesion, left chest without hematoma/ecchymosis Psych- euthymic mood, full affect Neuro- no gross  deficits   Labs:   Lab Results  Component Value Date   WBC 10.2 09/06/2017   HGB 11.9 (L) 09/06/2017   HCT 36.7 09/06/2017   MCV 94.6 09/06/2017   PLT 327 09/06/2017     Recent Labs Lab 09/04/17 1054  NA 140  K 4.1  CL 106  CO2 25  BUN 12  CREATININE 0.59  CALCIUM 9.5  PROT 7.7  BILITOT 0.5  ALKPHOS 82  ALT 18  AST 21  GLUCOSE 132*    Discharge Medications:  Allergies as of 09/06/2017   No Known Allergies     Medication List    STOP taking these medications   ibuprofen 200 MG tablet Commonly known as:  ADVIL,MOTRIN   omeprazole 20 MG capsule Commonly known as:  PRILOSEC   predniSONE 20 MG tablet Commonly known as:  DELTASONE     TAKE these medications   acetaminophen 325 MG tablet Commonly known as:  TYLENOL Take 1-2 tablets (325-650 mg total) by mouth every 6 (six) hours as needed for mild pain or moderate pain.   albuterol 108 (90 Base) MCG/ACT inhaler Commonly known as:  PROVENTIL HFA;VENTOLIN HFA Inhale 2 puffs into the lungs every 4 (four) hours as needed for wheezing or shortness of breath (cough, shortness of breath or wheezing.).   apixaban 5 MG Tabs tablet Commonly known as:  ELIQUIS Take 1 tablet (5 mg total) by mouth 2 (two) times daily.   glucose monitoring kit monitoring kit Check cbgs bid as instructed by pcp.   metoprolol tartrate 50 MG tablet Commonly known as:  LOPRESSOR Take 1 tablet (50 mg total) by mouth 2 (two) times daily.   VITAMIN B-12 PO Take 1 tablet by mouth daily.            Discharge Care Instructions        Start     Ordered   09/06/17 0000  acetaminophen (TYLENOL) 325 MG tablet  Every 6 hours PRN    Question:  Supervising Provider  Answer:  Thompson Grayer   09/06/17 0911   09/06/17 0000  metoprolol tartrate (LOPRESSOR) 50 MG tablet  2 times daily    Question:  Supervising Provider  Answer:  Thompson Grayer   09/06/17 0911   09/06/17 0000  apixaban (ELIQUIS) 5 MG TABS tablet  2 times daily    Question:   Supervising Provider  Answer:  Thompson Grayer   09/06/17 0911   09/06/17 0000  Increase activity slowly     09/06/17 0911   09/06/17 0000  Diet - low sodium heart healthy     09/06/17 0911      Disposition: Home Discharge Instructions    Diet - low sodium heart healthy    Complete by:  As directed    Increase activity slowly    Complete by:  As directed      Follow-up Information    [facility] Follow up on 09/15/2017.   Specialty:  Cardiology Why:  10:00AM, wound check visit Contact information: [address], [address]  [place] [address] [phone]       Barrett, Rhonda G, PA-C Follow up on 09/16/2017.   Specialties:  Cardiology, Radiology Why:  8:00AM Contact information: [address] [place] [address] [phone]        Klein, Steven C, MD Follow up on 12/09/2017.   Specialty:  Cardiology Why:  11:15AM Contact information: [address] [place] [address] [phone]           Duration of Discharge Encounter: Greater than 30 minutes including physician time.  Signed, Renee Ursuy, PA-C 09/06/2017 9:11 AM  Seen and examined  HR less fast with tachycardia 100-120  Will follow over time with pacemaker  BP ok   Instructions given  Will begin anticoagulation at wound check     

## 2017-09-06 NOTE — Care Management Note (Signed)
Case Management Note  Patient Details  Name: Courtney [name] MRN: [id number] Date of Birth: 10/23/1946  Subjective/Objective:   Pt presented for tachybrady syndrome-s/p Implantation of Dual chamber PPM. Pt has support at the bedside.                   Action/Plan: CM did provide pt with the [facility] Number and she will be able to call for PCP that is in network with Insurance. No home needs identified at the time of visit.   Expected Discharge Date:  09/06/17               Expected Discharge Plan:  Home/Self Care  In-House Referral:  PCP / [facility]  Discharge planning Services  CM Consult  Post Acute Care Choice:  NA Choice offered to:  NA  DME Arranged:  N/A DME Agency:  NA  HH Arranged:  NA HH Agency:  NA  Status of Service:  Completed, signed off  If discussed at Long Length of Stay Meetings, dates discussed:    Additional Comments:  Gala Lewandowsky, RN 09/06/2017, 10:12 AM

## 2017-09-06 NOTE — Progress Notes (Signed)
I have reviewed site care, activity instructions, medicines and follow up with the patient's family.  I discussed need for PMD follow up and investigation into potential DM as well.  Currently the patient does not have primary care MD.  I have asked Case management to provide with [facility] primary care service information (d/w RN will discharge after they receive this information).  Francis Dowse, PA-C

## 2017-09-06 NOTE — Discharge Instructions (Signed)
It is important you get established with PMD for routine healthcare as well as investigation into elevated blood sugars and formal evaluation into possible Diabetes.  Please keep your scheduled cardiology follow up appointments.       Supplemental Discharge Instructions for  Pacemaker/Defibrillator Patients  Activity No heavy lifting or vigorous activity with your left/right arm for 6 to 8 weeks.  Do not raise your left/right arm above your head for one week.  Gradually raise your affected arm as drawn below.             08/09/17                       08/10/17                   08/11/17                   08/13/17 __  NO DRIVING for 1 week  ; you may begin driving on   4/0/98  .  WOUND CARE - Keep the wound area clean and dry.  Do not get this area wet, no showers for 24 hours; you may shower on  08/07/17   . - The tape/steri-strips on your wound will fall off; do not pull them off.  No bandage is needed on the site.  DO  NOT apply any creams, oils, or ointments to the wound area. - If you notice any drainage or discharge from the wound, any swelling or bruising at the site, or you develop a fever > 101? F after you are discharged home, call the office at once.  Special Instructions - You are still able to use cellular telephones; use the ear opposite the side where you have your pacemaker/defibrillator.  Avoid carrying your cellular phone near your device. - When traveling through airports, show security personnel your identification card to avoid being screened in the metal detectors.  Ask the security personnel to use the hand wand. - Avoid arc welding equipment, MRI testing (magnetic resonance imaging), TENS units (transcutaneous nerve stimulators).  Call the office for questions about other devices. - Avoid electrical appliances that are in poor condition or are not properly grounded. - Microwave ovens are safe to be near or to operate.  Additional information for defibrillator patients  should your device go off: - If your device goes off ONCE and you feel fine afterward, notify the device clinic nurses. - If your device goes off ONCE and you do not feel well afterward, call 911. - If your device goes off TWICE, call 911. - If your device goes off THREE times in one day, call 911.  DO NOT DRIVE YOURSELF OR A FAMILY MEMBER WITH A DEFIBRILLATOR TO THE HOSPITAL--CALL 911.

## 2017-09-15 ENCOUNTER — Ambulatory Visit (INDEPENDENT_AMBULATORY_CARE_PROVIDER_SITE_OTHER): Payer: Medicare HMO | Admitting: *Deleted

## 2017-09-15 DIAGNOSIS — I495 Sick sinus syndrome: Secondary | ICD-10-CM | POA: Diagnosis not present

## 2017-09-15 LAB — CUP PACEART INCLINIC DEVICE CHECK
Battery Remaining Longevity: 80 mo
Battery Voltage: 3.05 V
Brady Statistic RA Percent Paced: 89 %
Brady Statistic RV Percent Paced: 2.7 %
Date Time Interrogation Session: 20181004105122
Implantable Lead Implant Date: 20180924
Implantable Lead Implant Date: 20180924
Implantable Lead Location: 753859
Implantable Lead Location: 753860
Implantable Lead Model: 1944
Implantable Lead Model: 1948
Implantable Pulse Generator Implant Date: 20180924
Lead Channel Impedance Value: 650 Ohm
Lead Channel Impedance Value: 750 Ohm
Lead Channel Pacing Threshold Amplitude: 0.5 V
Lead Channel Pacing Threshold Amplitude: 0.5 V
Lead Channel Pacing Threshold Amplitude: 0.75 V
Lead Channel Pacing Threshold Amplitude: 0.75 V
Lead Channel Pacing Threshold Pulse Width: 0.5 ms
Lead Channel Pacing Threshold Pulse Width: 0.5 ms
Lead Channel Pacing Threshold Pulse Width: 0.5 ms
Lead Channel Pacing Threshold Pulse Width: 0.5 ms
Lead Channel Sensing Intrinsic Amplitude: 12 mV
Lead Channel Sensing Intrinsic Amplitude: 4.1 mV
Lead Channel Setting Pacing Amplitude: 3.5 V
Lead Channel Setting Pacing Amplitude: 3.5 V
Lead Channel Setting Pacing Pulse Width: 0.5 ms
Lead Channel Setting Sensing Sensitivity: 2 mV
Pulse Gen Model: 2272
Pulse Gen Serial Number: 8940692

## 2017-09-15 NOTE — Progress Notes (Signed)
Wound check appointment. Dermabond removed. Wound without redness or edema. Incision edges approximated, wound well healed. Normal device function. Thresholds, sensing, and impedances consistent with implant measurements. Device programmed at 3.5V for extra safety margin until 3 month visit. Histogram distribution appropriate for patient and level of activity. 184 mode switches (7%) - patient to initiate Eliquis today per discharge note. No high ventricular rates noted. Patient educated about wound care, arm mobility, lifting restrictions. ROV with SK 12/28.  - Patient spends the winters in [place]: will f/u with daughter about home monitor use internationally as well as prescription medications and f/u appt with SK.

## 2017-09-16 ENCOUNTER — Encounter: Payer: Self-pay | Admitting: Physician Assistant

## 2017-09-16 ENCOUNTER — Telehealth: Payer: Self-pay

## 2017-09-16 ENCOUNTER — Ambulatory Visit (INDEPENDENT_AMBULATORY_CARE_PROVIDER_SITE_OTHER): Payer: Medicare HMO | Admitting: Physician Assistant

## 2017-09-16 VITALS — BP 126/64 | HR 74 | Ht 59.0 in | Wt 115.0 lb

## 2017-09-16 DIAGNOSIS — Z7901 Long term (current) use of anticoagulants: Secondary | ICD-10-CM | POA: Diagnosis not present

## 2017-09-16 DIAGNOSIS — R0781 Pleurodynia: Secondary | ICD-10-CM

## 2017-09-16 DIAGNOSIS — I495 Sick sinus syndrome: Secondary | ICD-10-CM | POA: Diagnosis not present

## 2017-09-16 DIAGNOSIS — I480 Paroxysmal atrial fibrillation: Secondary | ICD-10-CM | POA: Diagnosis not present

## 2017-09-16 DIAGNOSIS — R001 Bradycardia, unspecified: Secondary | ICD-10-CM

## 2017-09-16 NOTE — Progress Notes (Signed)
Cardiology Office Note   Date:  09/16/2017   ID:  Courtney [name], DOB 1946-06-28, MRN [id number]  PCP:  Patient, No Pcp Per  Cardiologist:  Dr. Oval Linsey, 09/04/2017 in hospital Courtney [name], Courtney Marker, PA-C    History of Present Illness: Courtney [name] is a 71 y.o. female with a history of DM  Admitted 09/23-09/25/2018 for tachybradycardia syndrome with atrial fibrillation rates reaching the 130s and sinus bradycardia into the 30s. CHADS2VASC= 3 (age x 1, female, DM)>>Eliquis; Pt got a St Jude PPM inserted. Patient discharged on metoprolol.   Courtney [name] presents for cardiology follow up. Her grandson is with her and translates.  Wound check 10/04, device functioning well. No problems with site.  She feels she has done well in many ways since d/c from the hospital. She has had no high heart rates, no presyncope.   However, when she lays down at night or during the day, she gets pain that runs from her epigastric region up to her neck. She also has pain in her mid-back at the same time. This all started after the PPM.   She has a little soreness when sitting, worse when she lays down. Worse with deep inspiration. The pacer site itself is very sore. There is no significant swelling at the site.  She is not having problems with dyspnea on exertion, orthopnea, or PND. She does not have lower extremity edema.   Past Medical History:  Diagnosis Date  . Allergy   . Diabetes mellitus type 2, diet-controlled ([place])   . PAF (paroxysmal atrial fibrillation) ([place]) 09/04/2017  . Sinus bradycardia 09/04/2017   HR 30s at times  . Tachy-brady syndrome ([place]) 09/04/2017    Past Surgical History:  Procedure Laterality Date  . PACEMAKER IMPLANT N/A 09/05/2017   Procedure: Pacemaker Implant;  Surgeon: Deboraha Sprang, MD;  Location: [facility];  Service: Cardiovascular;  Laterality: N/A;    Current Outpatient Prescriptions  Medication Sig Dispense Refill  . acetaminophen (TYLENOL) 325 MG tablet Take 1-2  tablets (325-650 mg total) by mouth every 6 (six) hours as needed for mild pain or moderate pain.    Marland Kitchen albuterol (PROVENTIL HFA;VENTOLIN HFA) 108 (90 Base) MCG/ACT inhaler Inhale 2 puffs into the lungs every 4 (four) hours as needed for wheezing or shortness of breath (cough, shortness of breath or wheezing.). 1 Inhaler 0  . apixaban (ELIQUIS) 5 MG TABS tablet Take 1 tablet (5 mg total) by mouth 2 (two) times daily. 60 tablet 6  . Cyanocobalamin (VITAMIN B-12 PO) Take 1 tablet by mouth daily.    Marland Kitchen glucose monitoring kit (FREESTYLE) monitoring kit Check cbgs bid as instructed by pcp. 1 each 11  . metoprolol tartrate (LOPRESSOR) 50 MG tablet Take 1 tablet (50 mg total) by mouth 2 (two) times daily. 60 tablet 6   No current facility-administered medications for this visit.     Allergies:   Patient has no known allergies.    Social History:  The patient  reports that she has been smoking.  She has never used smokeless tobacco.   Family History:  The patient's family history includes Diabetes in her son.    ROS:  Please see the history of present illness. All other systems are reviewed and negative.    PHYSICAL EXAM: VS:  BP 126/64   Pulse 74   Ht '4\' 11"'$  (1.499 m)   Wt 115 lb (52.2 kg)   SpO2 97%   BMI 23.23 kg/m  ,  BMI Body mass index is 23.23 kg/m. GEN: Well nourished, well developed, female in no acute distress  HEENT: normal for age  Neck: no JVD, no carotid bruit, no masses Cardiac: RRR; no murmur, no rubs, or gallops Respiratory:  clear to auscultation bilaterally, normal work of breathing GI: soft, nontender, nondistended, + BS MS: no deformity or atrophy; no edema; distal pulses are 2+ in all 4 extremities; She has chest wall tenderness in the sternal area and both right and left of it. She also has tenderness in her mid back between the scapulas  Skin: warm and dry, no rash Neuro:  Strength and sensation are intact Psych: euthymic mood, full affect   EKG:  EKG is not  ordered today.   Recent Labs: 09/04/2017: ALT 18; BUN 12; Creatinine, Ser 0.59; Potassium 4.1; Sodium 140; TSH 1.739 09/06/2017: Hemoglobin 11.9; Platelets 327    Lipid Panel No results found for: CHOL, TRIG, HDL, CHOLHDL, VLDL, LDLCALC, LDLDIRECT   Wt Readings from Last 3 Encounters:  09/16/17 115 lb (52.2 kg)  09/06/17 115 lb 8 oz (52.4 kg)  01/18/16 114 lb (51.7 kg)     Other studies Reviewed: Additional studies/ records that were reviewed today include: Hospital records and testing.  ASSESSMENT AND PLAN:  1.  Tachybradycardia syndrome: She is not having any symptoms of this since the pacemaker was inserted.  2. PAF: She is not having any palpitations and no higher heart rate episodes. She is advised to let us know if she gets palpitations or feels like her heart is racing.  3. Sinus bradycardia: She is atrial pacing 85% of the time. Her grandson states that they do have the box at home to do neurochecks. Diuretics contact us if there are any questions or concerns about this. She is okay to increase her activity gradually, she can to watering in her garden and pick vegetables but no planting until she is more improved.  4. Chronic anticoagulation: She is tolerating all was well, no bleeding issues. Continue this  5. Back and chest pain: She has both chest wall tenderness and back tenderness. I advised that she should not use NSAIDs because of the Ellik was. However, it is okay to use extra strength Tylenol. Tylenol PM at bedtime may also be helpful. If her symptoms do not improve, she should follow-up with her PCP.   Current medicines are reviewed at length with the patient today.  The patient does not have concerns regarding medicines.  The following changes have been made:  no change  Labs/ tests ordered today include:  No orders of the defined types were placed in this encounter.    Disposition:   FU with Dr. Oval Linsey and Dr. Caryl Comes  Signed, Tiffine Henigan, [place], PA-C    09/16/2017 9:08 AM    [place] Phone: [phone]; Fax: [phone]  This note was written with the assistance of speech recognition software. Please excuse any transcriptional errors.

## 2017-09-16 NOTE — Patient Instructions (Signed)
Medication Instructions:  NO CHANGES If you need a refill on your cardiac medications before your next appointment, please call your pharmacy.  Follow-Up: Your physician wants you to follow-up in: 6 MONTHS WITH DR [facility] You should receive a reminder letter in the mail two months in advance. If you do not receive a letter, please call our office FEB 2019 to schedule the April 2019 follow-up appointment.  Special Instructions: INCREASE ACTIVITY GRADUALLY  MAY WATER AND PICK IN THE GARDEN, NOTHING HEAVY UNTIL THE PAIN IS GONE  OK TO TAKE 2-TYLENOL DAILY AND TYLENOL PM AT NIGHT FOR SLEEP  CALL  AND LET DR Aleneva KNOW IF YOU HAC SHORTNESS OF BREATH, CHEST PAIN OR PALPITATIONS.  CALL FAMILY MD IF BACK AND CHEST PAIN CONTINUES MORE THAN 2-3 WEEKS  CALL DEVICE CLINIC-DR CAMNITZ [phone] CALL IF YOU HAVE ANY ISSUES WITH THE DOWNLOAD  Thank you for choosing [facility]!!

## 2017-09-16 NOTE — Telephone Encounter (Signed)
Called grandaughter to follow up with questions she had in regards to her grandmother's medications and home monitoring while her grandmother is in [place]. No answer - unable to leave a VM d/t full inbox.

## 2017-09-20 NOTE — Telephone Encounter (Signed)
LVM for granddaughter to call back and discuss home monitoring and medication for patient while in [place].

## 2017-11-18 ENCOUNTER — Encounter: Payer: Self-pay | Admitting: Internal Medicine

## 2017-12-09 ENCOUNTER — Encounter: Payer: Medicare HMO | Admitting: Internal Medicine

## 2017-12-15 ENCOUNTER — Encounter: Payer: Self-pay | Admitting: Internal Medicine

## 2018-01-31 ENCOUNTER — Encounter: Payer: Self-pay | Admitting: Internal Medicine

## 2018-03-14 ENCOUNTER — Encounter: Payer: Self-pay | Admitting: Internal Medicine

## 2018-03-21 ENCOUNTER — Ambulatory Visit (INDEPENDENT_AMBULATORY_CARE_PROVIDER_SITE_OTHER): Payer: Medicare HMO | Admitting: *Deleted

## 2018-03-21 DIAGNOSIS — I495 Sick sinus syndrome: Secondary | ICD-10-CM | POA: Diagnosis not present

## 2018-03-22 NOTE — Progress Notes (Signed)
Remote pacemaker transmission.   

## 2018-03-23 ENCOUNTER — Encounter: Payer: Self-pay | Admitting: Cardiology

## 2018-04-03 ENCOUNTER — Encounter: Payer: Self-pay | Admitting: Internal Medicine

## 2018-04-19 LAB — CUP PACEART REMOTE DEVICE CHECK
Battery Remaining Longevity: 79 mo
Battery Remaining Percentage: 95.5 %
Battery Voltage: 3.01 V
Brady Statistic AP VP Percent: 1.9 %
Brady Statistic AP VS Percent: 90 %
Brady Statistic AS VP Percent: 1 %
Brady Statistic AS VS Percent: 7.4 %
Brady Statistic RA Percent Paced: 85 %
Brady Statistic RV Percent Paced: 3.1 %
Date Time Interrogation Session: 20190409095350
Implantable Lead Implant Date: 20180924
Implantable Lead Implant Date: 20180924
Implantable Lead Location: 753859
Implantable Lead Location: 753860
Implantable Lead Model: 1944
Implantable Lead Model: 1948
Implantable Pulse Generator Implant Date: 20180924
Lead Channel Impedance Value: 760 Ohm
Lead Channel Impedance Value: 800 Ohm
Lead Channel Pacing Threshold Amplitude: 0.5 V
Lead Channel Pacing Threshold Amplitude: 0.75 V
Lead Channel Pacing Threshold Pulse Width: 0.5 ms
Lead Channel Pacing Threshold Pulse Width: 0.5 ms
Lead Channel Sensing Intrinsic Amplitude: 12 mV
Lead Channel Sensing Intrinsic Amplitude: 5 mV
Lead Channel Setting Pacing Amplitude: 3.5 V
Lead Channel Setting Pacing Amplitude: 3.5 V
Lead Channel Setting Pacing Pulse Width: 0.5 ms
Lead Channel Setting Sensing Sensitivity: 2 mV
Pulse Gen Model: 2272
Pulse Gen Serial Number: 8940692

## 2018-05-04 ENCOUNTER — Other Ambulatory Visit: Payer: Self-pay | Admitting: Cardiology

## 2018-05-04 MED ORDER — APIXABAN 5 MG PO TABS: 5.0000 mg | ORAL_TABLET | Freq: Two times a day (BID) | ORAL | 0 refills | Status: DC

## 2018-05-04 NOTE — Telephone Encounter (Signed)
New Message:        *STAT* If patient is at the pharmacy, call can be transferred to refill team.   1. Which medications need to be refilled? (please list name of each medication and dose if known) apixaban (ELIQUIS) 5 MG TABS tablet  metoprolol tartrate (LOPRESSOR) 50 MG tablet  2. Which pharmacy/location (including street and city if local pharmacy) is medication to be sent to?[id number] - [place], [place] - [address]  3. Do they need a 30 day or 90 day supply? 30

## 2018-05-29 ENCOUNTER — Ambulatory Visit: Payer: Medicare HMO | Admitting: Cardiology

## 2018-05-29 ENCOUNTER — Encounter: Payer: Self-pay | Admitting: Cardiology

## 2018-05-29 VITALS — BP 115/62 | HR 61 | Ht 59.0 in | Wt 101.8 lb

## 2018-05-29 DIAGNOSIS — I495 Sick sinus syndrome: Secondary | ICD-10-CM | POA: Diagnosis not present

## 2018-05-29 DIAGNOSIS — Z7901 Long term (current) use of anticoagulants: Secondary | ICD-10-CM

## 2018-05-29 DIAGNOSIS — Z950 Presence of cardiac pacemaker: Secondary | ICD-10-CM | POA: Insufficient documentation

## 2018-05-29 DIAGNOSIS — E118 Type 2 diabetes mellitus with unspecified complications: Secondary | ICD-10-CM

## 2018-05-29 DIAGNOSIS — I480 Paroxysmal atrial fibrillation: Secondary | ICD-10-CM

## 2018-05-29 MED ORDER — METOPROLOL TARTRATE 50 MG PO TABS: 75.0000 mg | ORAL_TABLET | Freq: Two times a day (BID) | ORAL | 6 refills | Status: DC

## 2018-05-29 MED ORDER — APIXABAN 5 MG PO TABS: 5.0000 mg | ORAL_TABLET | Freq: Two times a day (BID) | ORAL | 11 refills | Status: DC

## 2018-05-29 NOTE — Assessment & Plan Note (Signed)
Hgb A1c was 7.3 in Sept 2018- she has no PCP

## 2018-05-29 NOTE — Progress Notes (Signed)
05/29/2018 Courtney [name]   September 10, 1946  [id number]  Primary Physician Patient, No Pcp Per Primary Cardiologist: Dr Oval Linsey- Dr Belva Chimes  HPI:  72 y/o [place] female, here with her grandson who interpreted, with a history of untreated NIDDM, prior smoker, who was seen in Sept 2018 for symptomatic PAF. 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 She was placed on Lopressor and Eliquis. She ran out of her medications about 2 months ago. Two weeks ago she started noticing tachycardia with SSCP- "discomfort". Her grandson arranged this appointment today to get her medications refilled. She was given a month supply a few weeks ago and her symptoms have improved though in the office she had a run of AF with RVR (130)-documented by EKG.    Current Outpatient Medications  Medication Sig Dispense Refill  . acetaminophen (TYLENOL) 325 MG tablet Take 1-2 tablets (325-650 mg total) by mouth every 6 (six) hours as needed for mild pain or moderate pain.    Marland Kitchen albuterol (PROVENTIL HFA;VENTOLIN HFA) 108 (90 Base) MCG/ACT inhaler Inhale 2 puffs into the lungs every 4 (four) hours as needed for wheezing or shortness of breath (cough, shortness of breath or wheezing.). 1 Inhaler 0  . apixaban (ELIQUIS) 5 MG TABS tablet Take 1 tablet (5 mg total) by mouth 2 (two) times daily. CARDIOLOGY FOLLOW UP PRIOR TO NEXT REFILL AUTHORIZATION. 60 tablet 11  . Cyanocobalamin (VITAMIN B-12 PO) Take 1 tablet by mouth daily.    Marland Kitchen glucose monitoring kit (FREESTYLE) monitoring kit Check cbgs bid as instructed by pcp. 1 each 11  . metoprolol tartrate (LOPRESSOR) 50 MG tablet Take 1.5 tablets (75 mg total) by mouth 2 (two) times daily. 90 tablet 6   No current facility-administered medications for this visit.     No Known Allergies  Past Medical History:  Diagnosis Date  . Allergy   . Diabetes mellitus type 2, diet-controlled ([place])   . PAF (paroxysmal atrial fibrillation)  ([place]) 09/04/2017  . Sinus bradycardia 09/04/2017   HR 30s at times  . 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 Marital status: Single    Spouse name: Not on file  . Number of children: Not on file  . Years of education: Not on file  . Highest education level: Not on file  Occupational History  . Not on file  Social Needs  . Financial resource strain: Not on file  . Food insecurity:    Worry: Not on file    Inability: Not on file  . Transportation needs:    Medical: Not on file    Non-medical: Not on file  Tobacco Use  . Smoking status: Current Every Day Smoker  . Smokeless tobacco: Never Used  Substance and Sexual Activity  . Alcohol use: No    Frequency: Never  . Drug use: No  . Sexual activity: Not on file  Lifestyle  . Physical activity:    Days per week: Not on file    Minutes per session: Not on file  . Stress: Not on file  Relationships  . Social connections:    Talks on phone: Not on file    Gets together: Not on file    Attends religious service: Not on file    Active member of club or organization: Not on file    Attends meetings of clubs or organizations: Not on file    Relationship status:  Not on file  . Intimate partner violence:    Fear of current or ex partner: Not on file    Emotionally abused: Not on file    Physically abused: Not on file    Forced sexual activity: Not on file  Other Topics Concern  . Not on file  Social History Narrative  . Not on file     Family History  Problem Relation Age of Onset  . Diabetes Son      Review of Systems: General: negative for chills, fever, night sweats or weight changes.  Cardiovascular: negative for chest pain, dyspnea on exertion, edema, orthopnea, palpitations, paroxysmal nocturnal dyspnea or shortness of breath Dermatological: negative for rash Respiratory: negative for cough or wheezing Urologic: negative for hematuria Abdominal: negative for  nausea, vomiting, diarrhea, bright red blood per rectum, melena, or hematemesis Neurologic: negative for visual changes, syncope, or dizziness All other systems reviewed and are otherwise negative except as noted above.    Blood pressure 115/62, pulse 61, height _0  (1.499 m), weight 101 lb 12.8 oz (46.2 kg).  General appearance: alert, cooperative and no distress Neck: no carotid bruit and no JVD Lungs: clear to auscultation bilaterally Heart: regular rate and rhythm Extremities: extremities normal, atraumatic, no cyanosis or edema Skin: Skin color, texture, turgor normal. No rashes or lesions Neurologic: Grossly normal  EKG A-paced at 60, also run of AF with RVR 131  ASSESSMENT AND PLAN:   Pacemaker St Jude pacemaker implanted Sept 2018  Paroxysmal atrial fibrillation ([place]) Pt had PAF documented in the office today- increase beta blocker  Chronic anticoagulation On Eliquis- CHADS VASC=2  Type 2 NIDDM-untreated Hgb A1c was 7.3 in Sept 2018- she has no PCP   PLAN  I increased her Lopressor to 75 mg BID and refilled her Eliquis. F/U pacemaker check in 3 months. I encouraged them to find a PCP to further evaluate her DM.   Kerin Ransom PA-C 05/29/2018 12:38 PM

## 2018-05-29 NOTE — Assessment & Plan Note (Signed)
Pt had PAF documented in the office today- increase beta blocker

## 2018-05-29 NOTE — Patient Instructions (Signed)
Medication Instructions:  INCREASE- Metoprolol Tart 75 mg (1 1/2 tablets) twice a day  If you need a refill on your cardiac medications before your next appointment, please call your pharmacy.  Labwork: None Ordered   Testing/Procedures: None Ordered  Follow-Up: Your physician wants you to follow-up in: 3 Months with Dr Graciela HusbandsKlein.     Thank you for choosing [facility]!!

## 2018-05-29 NOTE — Assessment & Plan Note (Signed)
St Jude pacemaker implanted Sept 2018

## 2018-05-29 NOTE — Assessment & Plan Note (Signed)
On Eliquis- CHADS VASC=2

## 2018-06-20 ENCOUNTER — Ambulatory Visit (INDEPENDENT_AMBULATORY_CARE_PROVIDER_SITE_OTHER): Payer: Medicare HMO | Admitting: *Deleted

## 2018-06-20 DIAGNOSIS — I495 Sick sinus syndrome: Secondary | ICD-10-CM

## 2018-06-21 NOTE — Progress Notes (Signed)
Remote pacemaker transmission.   

## 2018-07-04 LAB — CUP PACEART REMOTE DEVICE CHECK
Battery Remaining Longevity: 82 mo
Battery Remaining Percentage: 95.5 %
Battery Voltage: 3.01 V
Brady Statistic AP VP Percent: 1.8 %
Brady Statistic AP VS Percent: 87 %
Brady Statistic AS VP Percent: 1 %
Brady Statistic AS VS Percent: 10 %
Brady Statistic RA Percent Paced: 82 %
Brady Statistic RV Percent Paced: 3.1 %
Date Time Interrogation Session: 20190706024058
Implantable Lead Implant Date: 20180924
Implantable Lead Implant Date: 20180924
Implantable Lead Location: 753859
Implantable Lead Location: 753860
Implantable Lead Model: 1944
Implantable Lead Model: 1948
Implantable Pulse Generator Implant Date: 20180924
Lead Channel Impedance Value: 800 Ohm
Lead Channel Impedance Value: 950 Ohm
Lead Channel Pacing Threshold Amplitude: 0.5 V
Lead Channel Pacing Threshold Amplitude: 0.75 V
Lead Channel Pacing Threshold Pulse Width: 0.5 ms
Lead Channel Pacing Threshold Pulse Width: 0.5 ms
Lead Channel Sensing Intrinsic Amplitude: 12 mV
Lead Channel Sensing Intrinsic Amplitude: 5 mV
Lead Channel Setting Pacing Amplitude: 3.5 V
Lead Channel Setting Pacing Amplitude: 3.5 V
Lead Channel Setting Pacing Pulse Width: 0.5 ms
Lead Channel Setting Sensing Sensitivity: 2 mV
Pulse Gen Model: 2272
Pulse Gen Serial Number: 8940692

## 2018-08-29 ENCOUNTER — Ambulatory Visit (INDEPENDENT_AMBULATORY_CARE_PROVIDER_SITE_OTHER): Payer: Medicare HMO | Admitting: Internal Medicine

## 2018-08-29 ENCOUNTER — Encounter: Payer: Self-pay | Admitting: Internal Medicine

## 2018-08-29 VITALS — BP 110/60 | HR 60 | Ht 59.0 in | Wt 96.4 lb

## 2018-08-29 DIAGNOSIS — I480 Paroxysmal atrial fibrillation: Secondary | ICD-10-CM

## 2018-08-29 DIAGNOSIS — Z7901 Long term (current) use of anticoagulants: Secondary | ICD-10-CM | POA: Diagnosis not present

## 2018-08-29 DIAGNOSIS — Z95810 Presence of automatic (implantable) cardiac defibrillator: Secondary | ICD-10-CM

## 2018-08-29 DIAGNOSIS — I495 Sick sinus syndrome: Secondary | ICD-10-CM | POA: Diagnosis not present

## 2018-08-29 DIAGNOSIS — E119 Type 2 diabetes mellitus without complications: Secondary | ICD-10-CM

## 2018-08-29 LAB — CUP PACEART INCLINIC DEVICE CHECK
Battery Remaining Longevity: 122 mo
Battery Voltage: 2.99 V
Brady Statistic RA Percent Paced: 83 %
Brady Statistic RV Percent Paced: 2.8 %
Date Time Interrogation Session: 20190917165838
Implantable Lead Implant Date: 20180924
Implantable Lead Implant Date: 20180924
Implantable Lead Location: 753859
Implantable Lead Location: 753860
Implantable Lead Model: 1944
Implantable Lead Model: 1948
Implantable Pulse Generator Implant Date: 20180924
Lead Channel Impedance Value: 775 Ohm
Lead Channel Impedance Value: 837.5 Ohm
Lead Channel Pacing Threshold Amplitude: 0.5 V
Lead Channel Pacing Threshold Amplitude: 0.75 V
Lead Channel Pacing Threshold Pulse Width: 0.5 ms
Lead Channel Pacing Threshold Pulse Width: 0.5 ms
Lead Channel Sensing Intrinsic Amplitude: 12 mV
Lead Channel Sensing Intrinsic Amplitude: 5 mV
Lead Channel Setting Pacing Amplitude: 2 V
Lead Channel Setting Pacing Amplitude: 2.5 V
Lead Channel Setting Pacing Pulse Width: 0.5 ms
Lead Channel Setting Sensing Sensitivity: 2 mV
Pulse Gen Model: 2272
Pulse Gen Serial Number: 8940692

## 2018-08-29 MED ORDER — METOPROLOL TARTRATE 50 MG PO TABS: 50.0000 mg | ORAL_TABLET | Freq: Two times a day (BID) | ORAL | 6 refills | Status: DC

## 2018-08-29 MED ORDER — FLECAINIDE ACETATE 50 MG PO TABS: 50.0000 mg | ORAL_TABLET | Freq: Two times a day (BID) | ORAL | 3 refills | Status: DC

## 2018-08-29 NOTE — Patient Instructions (Addendum)
Medication Instructions:  Your physician has recommended you make the following change in your medication:   1. Decrease Metoprolol Tartrate to 50mg , one tablet, two times per day 2. Begin Flecainide, 50mg  tablet, two times per day  Labwork: You will have labs drawn today: CBC and BMP  Testing/Procedures: None ordered.  Follow-Up:  2 weeks for a RN visit for an EKG; new start flecainide.  Your physician wants you to follow-up in: One Year with Dr Graciela HusbandsKlein. You will receive a reminder letter in the mail two months in advance. If you don't receive a letter, please call our office to schedule the follow-up appointment.  Remote monitoring is used to monitor your ICD from home. This monitoring reduces the number of office visits required to check your device to one time per year. It allows us to keep an eye on the functioning of your device to ensure it is working properly. You are scheduled for a device check from home on 09/19/2018. You may send your transmission at any time that day. If you have a wireless device, the transmission will be sent automatically. After your physician reviews your transmission, you will receive a postcard with your next transmission date.    Any Other Special Instructions Will Be Listed Below (If Applicable).     If you need a refill on your cardiac medications before your next appointment, please call your pharmacy.

## 2018-08-29 NOTE — Progress Notes (Signed)
Patient Care Team: Patient, No Pcp Per as PCP - General (General Practice)   HPI  Courtney [name] is a 72 y.o. female Seen in followup for pacing 2018 for tachybrady syndrome;  She also had afib and was started on apixoban   Thromboembolic risk factors ( age -89,   DM-1, Gender-1) for a CHADSVASc Score of 3  Date Cr K Hgb TSH  9/18 0.59 4.1 11.9 1.739          DATE PR interval QRSduration Dose  9/19  140 82 0         Recurrent symptomatic tachypalpitations with assoc dyspnea dn some lightheadedness    She has unexplained weight loss with a good diet and alopecia  Records and Results Reviewed   Past Medical History:  Diagnosis Date  . Allergy   . Diabetes mellitus type 2, diet-controlled ([place])   . PAF (paroxysmal atrial fibrillation) ([place]) 09/04/2017  . Sinus bradycardia 09/04/2017   HR 30s at times  . Tachy-brady syndrome ([place]) 09/04/2017    Past Surgical History:  Procedure Laterality Date  . PACEMAKER IMPLANT N/A 09/05/2017   Procedure: Pacemaker Implant;  Surgeon: Deboraha Sprang, MD;  Location: [facility];  Service: Cardiovascular;  Laterality: N/A;    Current Meds  Medication Sig  . acetaminophen (TYLENOL) 325 MG tablet Take 1-2 tablets (325-650 mg total) by mouth every 6 (six) hours as needed for mild pain or moderate pain.  Marland Kitchen albuterol (PROVENTIL HFA;VENTOLIN HFA) 108 (90 Base) MCG/ACT inhaler Inhale 2 puffs into the lungs every 4 (four) hours as needed for wheezing or shortness of breath (cough, shortness of breath or wheezing.).  Marland Kitchen apixaban (ELIQUIS) 5 MG TABS tablet Take 1 tablet (5 mg total) by mouth 2 (two) times daily. CARDIOLOGY FOLLOW UP PRIOR TO NEXT REFILL AUTHORIZATION.  Marland Kitchen Cyanocobalamin (VITAMIN B-12 PO) Take 1 tablet by mouth daily.  Marland Kitchen glucose monitoring kit (FREESTYLE) monitoring kit Check cbgs bid as instructed by pcp.  . metoprolol tartrate (LOPRESSOR) 50 MG tablet Take 1.5 tablets (75 mg total) by mouth 2 (two) times daily.    No  Known Allergies    Review of Systems negative except from HPI and PMH  Physical Exam BP 110/60   Pulse 60   Ht '4\' 11"'$  (1.499 m)   Wt 96 lb 6.4 oz (43.7 kg)   SpO2 99%   BMI 19.47 kg/m  Well developed and nourished in no acute distress HENT normal Neck supple with JVP-flat Clear Device pocket well healed; without hematoma or erythema.  There is no tethering  Regular rate and rhythm, no murmurs or gallops Abd-soft with active BS No Clubbing cyanosis edema Skin-warm and dry A & Oriented  Grossly normal sensory and motor function  ECG Apacing @ 60 14/08.46  Assessment and  Plan Sinus node dysfunction  Atrial Fibrillation  Pacemaker St Jude   Weightloss/alopecia  The pt has intermittent atrial fibrillation which is bothersome and symptomatic.  Is about 6% of the time it occurs at least weekly.  We have discussed the role of antiarrhythmic therapy in the suppression of her A. fib and symptoms.  We discussed proarrhythmic risk and she would like to do this.  I should note that this was discussed with her granddaughter as the Optometrist.  Given her relatively low blood pressure and the augmented rhythm controlling strategy, we will decrease metoprolol from 75--50 mg twice daily.  We need to check other laboratories.  She has  diabetes.  Will reassess hemoglobin A1c.  We will reevaluate her hemoglobin and her TSH given the weight loss and alopecia.  We spent more than 50% of our >25 min visit in face to face counseling regarding the above  We will have her seen in 3 months in the A. fib clinic and will check an echocardiogram in 2 weeks to look at conduction parameters following the initiation of her 1C therapy.  She is not physically active.  We will not submitted for treadmill testing.    Current medicines are reviewed at length with the patient today .  The patient does not have concerns regarding medicines.

## 2018-08-30 LAB — BASIC METABOLIC PANEL
BUN/Creatinine Ratio: 38 — ABNORMAL HIGH (ref 12–28)
BUN: 26 mg/dL (ref 8–27)
CO2: 20 mmol/L (ref 20–29)
Calcium: 9.6 mg/dL (ref 8.7–10.3)
Chloride: 95 mmol/L — ABNORMAL LOW (ref 96–106)
Creatinine, Ser: 0.69 mg/dL (ref 0.57–1.00)
GFR calc Af Amer: 101 mL/min/{1.73_m2} (ref >59)
GFR calc non Af Amer: 88 mL/min/{1.73_m2} (ref >59)
Glucose: 377 mg/dL — ABNORMAL HIGH (ref 65–99)
Potassium: 4.2 mmol/L (ref 3.5–5.2)
Sodium: 132 mmol/L — ABNORMAL LOW (ref 134–144)

## 2018-08-30 LAB — CBC
Hematocrit: 39.5 % (ref 34.0–46.6)
Hemoglobin: 12.8 g/dL (ref 11.1–15.9)
MCH: 30.6 pg (ref 26.6–33.0)
MCHC: 32.4 g/dL (ref 31.5–35.7)
MCV: 95 fL (ref 79–97)
Platelets: 302 10*3/uL (ref 150–450)
RBC: 4.18 x10E6/uL (ref 3.77–5.28)
RDW: 11.8 % — ABNORMAL LOW (ref 12.3–15.4)
WBC: 11.1 10*3/uL — ABNORMAL HIGH (ref 3.4–10.8)

## 2018-08-30 LAB — HEMOGLOBIN A1C
Est. average glucose Bld gHb Est-mCnc: >398 mg/dL
Hgb A1c MFr Bld: >15.5 % — ABNORMAL HIGH (ref 4.8–5.6)

## 2018-08-30 LAB — TSH: TSH: 1.01 u[IU]/mL (ref 0.450–4.500)

## 2018-09-13 ENCOUNTER — Ambulatory Visit (INDEPENDENT_AMBULATORY_CARE_PROVIDER_SITE_OTHER): Payer: Medicare HMO

## 2018-09-13 VITALS — BP 118/62 | HR 60 | Ht <= 58 in | Wt 97.0 lb

## 2018-09-13 DIAGNOSIS — I495 Sick sinus syndrome: Secondary | ICD-10-CM

## 2018-09-13 DIAGNOSIS — Z950 Presence of cardiac pacemaker: Secondary | ICD-10-CM

## 2018-09-13 DIAGNOSIS — Z7901 Long term (current) use of anticoagulants: Secondary | ICD-10-CM | POA: Diagnosis not present

## 2018-09-13 DIAGNOSIS — I480 Paroxysmal atrial fibrillation: Secondary | ICD-10-CM

## 2018-09-13 NOTE — Progress Notes (Signed)
Pt presents today for a routine EKG secondary to flecainide start x 2 weeks. Pt is here with grandson who is interpreting for her.   Pt states she has been feeling much better since starting the flecainide. Her palpitations are less frequent and she does not notice them often.  EKG and vitals were performed and brought to Dr Johney Frame. Per Dr Johney Frame, EKG was OK and patient is to follow up with Dr Graciela Husbands as planned. I advised pt that if her palpitations did begin to come back, she should call the office for medication adjustments.   Pt and her grandson have verbalized understanding.

## 2018-09-14 NOTE — Addendum Note (Signed)
Addended by: Oretha Milch on: 09/14/2018 09:05 AM   Modules accepted: Orders

## 2018-09-19 ENCOUNTER — Telehealth: Payer: Self-pay | Admitting: Cardiology

## 2018-09-19 ENCOUNTER — Ambulatory Visit (INDEPENDENT_AMBULATORY_CARE_PROVIDER_SITE_OTHER): Payer: Medicare HMO | Admitting: *Deleted

## 2018-09-19 DIAGNOSIS — I495 Sick sinus syndrome: Secondary | ICD-10-CM

## 2018-09-19 NOTE — Telephone Encounter (Signed)
Confirmed remote transmission w/ pt granddaughter.   

## 2018-09-20 NOTE — Progress Notes (Signed)
Remote pacemaker transmission.   

## 2018-09-28 ENCOUNTER — Encounter: Payer: Self-pay | Admitting: Cardiology

## 2018-09-28 DIAGNOSIS — R69 Illness, unspecified: Secondary | ICD-10-CM | POA: Diagnosis not present

## 2018-12-01 LAB — CUP PACEART REMOTE DEVICE CHECK
Battery Remaining Longevity: 123 mo
Battery Remaining Percentage: 95.5 %
Battery Voltage: 3.01 V
Brady Statistic AP VP Percent: 1.2 %
Brady Statistic AP VS Percent: 98 %
Brady Statistic AS VP Percent: 1 %
Brady Statistic AS VS Percent: 1.2 %
Brady Statistic RA Percent Paced: 97 %
Brady Statistic RV Percent Paced: 1.4 %
Date Time Interrogation Session: 20191009032213
Implantable Lead Implant Date: 20180924
Implantable Lead Implant Date: 20180924
Implantable Lead Location: 753859
Implantable Lead Location: 753860
Implantable Lead Model: 1944
Implantable Lead Model: 1948
Implantable Pulse Generator Implant Date: 20180924
Lead Channel Impedance Value: 750 Ohm
Lead Channel Impedance Value: 750 Ohm
Lead Channel Pacing Threshold Amplitude: 0.5 V
Lead Channel Pacing Threshold Amplitude: 0.75 V
Lead Channel Pacing Threshold Pulse Width: 0.5 ms
Lead Channel Pacing Threshold Pulse Width: 0.5 ms
Lead Channel Sensing Intrinsic Amplitude: 12 mV
Lead Channel Sensing Intrinsic Amplitude: 4.4 mV
Lead Channel Setting Pacing Amplitude: 2 V
Lead Channel Setting Pacing Amplitude: 2.5 V
Lead Channel Setting Pacing Pulse Width: 0.5 ms
Lead Channel Setting Sensing Sensitivity: 2 mV
Pulse Gen Model: 2272
Pulse Gen Serial Number: 8940692

## 2018-12-19 ENCOUNTER — Ambulatory Visit (INDEPENDENT_AMBULATORY_CARE_PROVIDER_SITE_OTHER): Payer: Medicare HMO

## 2018-12-19 DIAGNOSIS — I495 Sick sinus syndrome: Secondary | ICD-10-CM | POA: Diagnosis not present

## 2018-12-20 NOTE — Progress Notes (Signed)
Remote pacemaker transmission.   

## 2018-12-21 LAB — CUP PACEART REMOTE DEVICE CHECK
Battery Remaining Longevity: 123 mo
Battery Remaining Percentage: 95.5 %
Battery Voltage: 3.01 V
Brady Statistic AP VP Percent: 1 %
Brady Statistic AP VS Percent: 95 %
Brady Statistic AS VP Percent: 1 %
Brady Statistic AS VS Percent: 4 %
Brady Statistic RA Percent Paced: 95 %
Brady Statistic RV Percent Paced: 1.1 %
Date Time Interrogation Session: 20200107180823
Implantable Lead Implant Date: 20180924
Implantable Lead Implant Date: 20180924
Implantable Lead Location: 753859
Implantable Lead Location: 753860
Implantable Lead Model: 1944
Implantable Lead Model: 1948
Implantable Pulse Generator Implant Date: 20180924
Lead Channel Impedance Value: 690 Ohm
Lead Channel Impedance Value: 790 Ohm
Lead Channel Pacing Threshold Amplitude: 0.5 V
Lead Channel Pacing Threshold Amplitude: 0.75 V
Lead Channel Pacing Threshold Pulse Width: 0.5 ms
Lead Channel Pacing Threshold Pulse Width: 0.5 ms
Lead Channel Sensing Intrinsic Amplitude: 12 mV
Lead Channel Sensing Intrinsic Amplitude: 3.9 mV
Lead Channel Setting Pacing Amplitude: 2 V
Lead Channel Setting Pacing Amplitude: 2.5 V
Lead Channel Setting Pacing Pulse Width: 0.5 ms
Lead Channel Setting Sensing Sensitivity: 2 mV
Pulse Gen Model: 2272
Pulse Gen Serial Number: 8940692

## 2019-03-20 ENCOUNTER — Encounter: Payer: Medicare HMO | Admitting: *Deleted

## 2019-03-20 ENCOUNTER — Other Ambulatory Visit: Payer: Self-pay

## 2019-03-21 ENCOUNTER — Telehealth: Payer: Self-pay

## 2019-03-21 NOTE — Telephone Encounter (Signed)
Unable to leave a message for patient to remind of missed remote transmission.  

## 2019-04-03 ENCOUNTER — Other Ambulatory Visit: Payer: Self-pay

## 2019-04-03 ENCOUNTER — Ambulatory Visit (INDEPENDENT_AMBULATORY_CARE_PROVIDER_SITE_OTHER): Payer: Medicare HMO | Admitting: *Deleted

## 2019-04-03 DIAGNOSIS — I495 Sick sinus syndrome: Secondary | ICD-10-CM

## 2019-04-03 DIAGNOSIS — I480 Paroxysmal atrial fibrillation: Secondary | ICD-10-CM

## 2019-04-03 LAB — CUP PACEART REMOTE DEVICE CHECK
Battery Remaining Longevity: 126 mo
Battery Remaining Percentage: 95.5 %
Battery Voltage: 3.01 V
Brady Statistic AP VP Percent: 1 %
Brady Statistic AP VS Percent: 92 %
Brady Statistic AS VP Percent: 1 %
Brady Statistic AS VS Percent: 7.3 %
Brady Statistic RA Percent Paced: 92 %
Brady Statistic RV Percent Paced: 1 %
Date Time Interrogation Session: 20200421045904
Implantable Lead Implant Date: 20180924
Implantable Lead Implant Date: 20180924
Implantable Lead Location: 753859
Implantable Lead Location: 753860
Implantable Lead Model: 1944
Implantable Lead Model: 1948
Implantable Pulse Generator Implant Date: 20180924
Lead Channel Impedance Value: 710 Ohm
Lead Channel Impedance Value: 900 Ohm
Lead Channel Pacing Threshold Amplitude: 0.5 V
Lead Channel Pacing Threshold Amplitude: 0.75 V
Lead Channel Pacing Threshold Pulse Width: 0.5 ms
Lead Channel Pacing Threshold Pulse Width: 0.5 ms
Lead Channel Sensing Intrinsic Amplitude: 12 mV
Lead Channel Sensing Intrinsic Amplitude: 4.6 mV
Lead Channel Setting Pacing Amplitude: 2 V
Lead Channel Setting Pacing Amplitude: 2.5 V
Lead Channel Setting Pacing Pulse Width: 0.5 ms
Lead Channel Setting Sensing Sensitivity: 2 mV
Pulse Gen Model: 2272
Pulse Gen Serial Number: 8940692

## 2019-04-10 NOTE — Progress Notes (Signed)
Remote pacemaker transmission.   

## 2019-05-31 ENCOUNTER — Other Ambulatory Visit: Payer: Self-pay | Admitting: Cardiology

## 2019-05-31 NOTE — Telephone Encounter (Signed)
Pt is requesting 5mg  eliquis and is a 72yof, wt of 43.7kg, scr 0.69(08/29/18), lov w/Dr Caryl Comes (08/29/18) DX AFIB I would refill at 5mg  for 3 mos

## 2019-07-03 ENCOUNTER — Ambulatory Visit (INDEPENDENT_AMBULATORY_CARE_PROVIDER_SITE_OTHER): Payer: Medicare HMO | Admitting: *Deleted

## 2019-07-03 DIAGNOSIS — I495 Sick sinus syndrome: Secondary | ICD-10-CM

## 2019-07-04 ENCOUNTER — Telehealth: Payer: Self-pay

## 2019-07-04 NOTE — Telephone Encounter (Signed)
Left message for patient to remind of missed remote transmission.  

## 2019-07-05 LAB — CUP PACEART REMOTE DEVICE CHECK
Battery Remaining Longevity: 127 mo
Battery Remaining Percentage: 95.5 %
Battery Voltage: 3.01 V
Brady Statistic AP VP Percent: 1 %
Brady Statistic AP VS Percent: 77 %
Brady Statistic AS VP Percent: 1 %
Brady Statistic AS VS Percent: 22 %
Brady Statistic RA Percent Paced: 77 %
Brady Statistic RV Percent Paced: 1 %
Date Time Interrogation Session: 20200723094423
Implantable Lead Implant Date: 20180924
Implantable Lead Implant Date: 20180924
Implantable Lead Location: 753859
Implantable Lead Location: 753860
Implantable Lead Model: 1944
Implantable Lead Model: 1948
Implantable Pulse Generator Implant Date: 20180924
Lead Channel Impedance Value: 730 Ohm
Lead Channel Impedance Value: 900 Ohm
Lead Channel Sensing Intrinsic Amplitude: 12 mV
Lead Channel Sensing Intrinsic Amplitude: 5 mV
Lead Channel Setting Pacing Amplitude: 2 V
Lead Channel Setting Pacing Amplitude: 2.5 V
Lead Channel Setting Pacing Pulse Width: 0.5 ms
Lead Channel Setting Sensing Sensitivity: 2 mV
Pulse Gen Model: 2272
Pulse Gen Serial Number: 8940692

## 2019-07-19 ENCOUNTER — Encounter: Payer: Self-pay | Admitting: Cardiology

## 2019-07-19 NOTE — Progress Notes (Signed)
Remote pacemaker transmission.   

## 2019-07-26 ENCOUNTER — Other Ambulatory Visit: Payer: Self-pay | Admitting: Internal Medicine

## 2019-07-26 ENCOUNTER — Other Ambulatory Visit: Payer: Self-pay

## 2019-07-26 MED ORDER — APIXABAN 5 MG PO TABS: 5.0000 mg | ORAL_TABLET | Freq: Two times a day (BID) | ORAL | 1 refills | Status: DC

## 2019-07-26 MED ORDER — METOPROLOL TARTRATE 50 MG PO TABS: 50.0000 mg | ORAL_TABLET | Freq: Two times a day (BID) | ORAL | 0 refills | Status: DC

## 2019-07-26 MED ORDER — FLECAINIDE ACETATE 50 MG PO TABS: 50.0000 mg | ORAL_TABLET | Freq: Two times a day (BID) | ORAL | 0 refills | Status: DC

## 2019-07-26 NOTE — Telephone Encounter (Signed)
New Message    *STAT* If patient is at the pharmacy, call can be transferred to refill team.   1. Which medications need to be refilled? (please list name of each medication and dose if known) ELIQUIS 5 MG TABS tablet, flecainide (TAMBOCOR) 50 MG tablet and metoprolol tartrate (LOPRESSOR) 50 MG tablet    2. Which pharmacy/location (including street and city if local pharmacy) is medication to be sent to? [id number] - [place], [place] - [address]  3. Do they need a 30 day or 90 day supply? [place]

## 2019-07-26 NOTE — Telephone Encounter (Signed)
Last OV 08/29/2018 Scr 0.69 on 08/29/2018 73 years old 44kg Elqiuis 5mg  BID sent to pharmacy

## 2019-08-31 ENCOUNTER — Other Ambulatory Visit: Payer: Self-pay | Admitting: Internal Medicine

## 2019-08-31 MED ORDER — METOPROLOL TARTRATE 50 MG PO TABS: 50.0000 mg | ORAL_TABLET | Freq: Two times a day (BID) | ORAL | 0 refills | Status: DC

## 2019-09-04 ENCOUNTER — Encounter: Payer: Medicare HMO | Admitting: Internal Medicine

## 2019-09-09 DIAGNOSIS — R69 Illness, unspecified: Secondary | ICD-10-CM | POA: Diagnosis not present

## 2019-10-02 ENCOUNTER — Ambulatory Visit (INDEPENDENT_AMBULATORY_CARE_PROVIDER_SITE_OTHER): Payer: Medicare HMO | Admitting: *Deleted

## 2019-10-02 DIAGNOSIS — I495 Sick sinus syndrome: Secondary | ICD-10-CM

## 2019-10-02 DIAGNOSIS — I480 Paroxysmal atrial fibrillation: Secondary | ICD-10-CM

## 2019-10-04 LAB — CUP PACEART REMOTE DEVICE CHECK
Battery Remaining Longevity: 127 mo
Battery Remaining Percentage: 95.5 %
Battery Voltage: 3.01 V
Brady Statistic AP VP Percent: 1 %
Brady Statistic AP VS Percent: 77 %
Brady Statistic AS VP Percent: 1 %
Brady Statistic AS VS Percent: 22 %
Brady Statistic RA Percent Paced: 77 %
Brady Statistic RV Percent Paced: 1 %
Date Time Interrogation Session: 20201022080253
Implantable Lead Implant Date: 20180924
Implantable Lead Implant Date: 20180924
Implantable Lead Location: 753859
Implantable Lead Location: 753860
Implantable Lead Model: 1944
Implantable Lead Model: 1948
Implantable Pulse Generator Implant Date: 20180924
Lead Channel Impedance Value: 710 Ohm
Lead Channel Impedance Value: 930 Ohm
Lead Channel Pacing Threshold Amplitude: 0.5 V
Lead Channel Pacing Threshold Amplitude: 0.75 V
Lead Channel Pacing Threshold Pulse Width: 0.5 ms
Lead Channel Pacing Threshold Pulse Width: 0.5 ms
Lead Channel Sensing Intrinsic Amplitude: 12 mV
Lead Channel Sensing Intrinsic Amplitude: 4.4 mV
Lead Channel Setting Pacing Amplitude: 2 V
Lead Channel Setting Pacing Amplitude: 2.5 V
Lead Channel Setting Pacing Pulse Width: 0.5 ms
Lead Channel Setting Sensing Sensitivity: 2 mV
Pulse Gen Model: 2272
Pulse Gen Serial Number: 8940692

## 2019-10-06 ENCOUNTER — Inpatient Hospital Stay (HOSPITAL_COMMUNITY)
Admission: EM | Admit: 2019-10-06 | Discharge: 2019-10-18 | DRG: 871 | Disposition: A | Discharge status: Home Health | Payer: Medicare HMO | Attending: Internal Medicine | Admitting: Internal Medicine

## 2019-10-06 ENCOUNTER — Emergency Department (HOSPITAL_COMMUNITY): Payer: Medicare HMO

## 2019-10-06 DIAGNOSIS — R6520 Severe sepsis without septic shock: Secondary | ICD-10-CM | POA: Diagnosis present

## 2019-10-06 DIAGNOSIS — R1010 Upper abdominal pain, unspecified: Secondary | ICD-10-CM | POA: Diagnosis not present

## 2019-10-06 DIAGNOSIS — R579 Shock, unspecified: Secondary | ICD-10-CM

## 2019-10-06 DIAGNOSIS — I700 Atherosclerosis of aorta: Secondary | ICD-10-CM

## 2019-10-06 DIAGNOSIS — A4159 Other Gram-negative sepsis: Secondary | ICD-10-CM | POA: Diagnosis not present

## 2019-10-06 DIAGNOSIS — R0902 Hypoxemia: Secondary | ICD-10-CM | POA: Diagnosis not present

## 2019-10-06 DIAGNOSIS — B961 Klebsiella pneumoniae [K. pneumoniae] as the cause of diseases classified elsewhere: Secondary | ICD-10-CM | POA: Diagnosis present

## 2019-10-06 DIAGNOSIS — Z794 Long term (current) use of insulin: Secondary | ICD-10-CM

## 2019-10-06 DIAGNOSIS — Z79899 Other long term (current) drug therapy: Secondary | ICD-10-CM

## 2019-10-06 DIAGNOSIS — R519 Headache, unspecified: Secondary | ICD-10-CM

## 2019-10-06 DIAGNOSIS — D638 Anemia in other chronic diseases classified elsewhere: Secondary | ICD-10-CM | POA: Diagnosis present

## 2019-10-06 DIAGNOSIS — K8309 Other cholangitis: Secondary | ICD-10-CM

## 2019-10-06 DIAGNOSIS — K750 Abscess of liver: Secondary | ICD-10-CM | POA: Diagnosis not present

## 2019-10-06 DIAGNOSIS — E118 Type 2 diabetes mellitus with unspecified complications: Secondary | ICD-10-CM | POA: Diagnosis present

## 2019-10-06 DIAGNOSIS — A419 Sepsis, unspecified organism: Secondary | ICD-10-CM | POA: Diagnosis not present

## 2019-10-06 DIAGNOSIS — A498 Other bacterial infections of unspecified site: Secondary | ICD-10-CM | POA: Diagnosis present

## 2019-10-06 DIAGNOSIS — R188 Other ascites: Secondary | ICD-10-CM | POA: Diagnosis not present

## 2019-10-06 DIAGNOSIS — R0602 Shortness of breath: Secondary | ICD-10-CM | POA: Diagnosis not present

## 2019-10-06 DIAGNOSIS — I38 Endocarditis, valve unspecified: Secondary | ICD-10-CM | POA: Diagnosis present

## 2019-10-06 DIAGNOSIS — I340 Nonrheumatic mitral (valve) insufficiency: Secondary | ICD-10-CM | POA: Diagnosis not present

## 2019-10-06 DIAGNOSIS — J150 Pneumonia due to Klebsiella pneumoniae: Secondary | ICD-10-CM | POA: Diagnosis not present

## 2019-10-06 DIAGNOSIS — E1165 Type 2 diabetes mellitus with hyperglycemia: Secondary | ICD-10-CM | POA: Diagnosis present

## 2019-10-06 DIAGNOSIS — J449 Chronic obstructive pulmonary disease, unspecified: Secondary | ICD-10-CM | POA: Diagnosis not present

## 2019-10-06 DIAGNOSIS — D72829 Elevated white blood cell count, unspecified: Secondary | ICD-10-CM | POA: Diagnosis not present

## 2019-10-06 DIAGNOSIS — F17200 Nicotine dependence, unspecified, uncomplicated: Secondary | ICD-10-CM | POA: Diagnosis present

## 2019-10-06 DIAGNOSIS — J9601 Acute respiratory failure with hypoxia: Secondary | ICD-10-CM | POA: Diagnosis not present

## 2019-10-06 DIAGNOSIS — I330 Acute and subacute infective endocarditis: Secondary | ICD-10-CM

## 2019-10-06 DIAGNOSIS — B159 Hepatitis A without hepatic coma: Secondary | ICD-10-CM | POA: Diagnosis present

## 2019-10-06 DIAGNOSIS — R410 Disorientation, unspecified: Secondary | ICD-10-CM

## 2019-10-06 DIAGNOSIS — Z20828 Contact with and (suspected) exposure to other viral communicable diseases: Secondary | ICD-10-CM | POA: Diagnosis present

## 2019-10-06 DIAGNOSIS — E8809 Other disorders of plasma-protein metabolism, not elsewhere classified: Secondary | ICD-10-CM | POA: Diagnosis not present

## 2019-10-06 DIAGNOSIS — G9341 Metabolic encephalopathy: Secondary | ICD-10-CM | POA: Diagnosis not present

## 2019-10-06 DIAGNOSIS — IMO0002 Reserved for concepts with insufficient information to code with codable children: Secondary | ICD-10-CM | POA: Diagnosis present

## 2019-10-06 DIAGNOSIS — R8271 Bacteriuria: Secondary | ICD-10-CM | POA: Diagnosis not present

## 2019-10-06 DIAGNOSIS — Z1612 Extended spectrum beta lactamase (ESBL) resistance: Secondary | ICD-10-CM | POA: Diagnosis present

## 2019-10-06 DIAGNOSIS — E119 Type 2 diabetes mellitus without complications: Secondary | ICD-10-CM | POA: Diagnosis not present

## 2019-10-06 DIAGNOSIS — K769 Liver disease, unspecified: Secondary | ICD-10-CM | POA: Diagnosis not present

## 2019-10-06 DIAGNOSIS — I482 Chronic atrial fibrillation, unspecified: Secondary | ICD-10-CM | POA: Diagnosis present

## 2019-10-06 DIAGNOSIS — R945 Abnormal results of liver function studies: Secondary | ICD-10-CM | POA: Diagnosis not present

## 2019-10-06 DIAGNOSIS — K219 Gastro-esophageal reflux disease without esophagitis: Secondary | ICD-10-CM | POA: Diagnosis present

## 2019-10-06 DIAGNOSIS — Z95828 Presence of other vascular implants and grafts: Secondary | ICD-10-CM | POA: Diagnosis not present

## 2019-10-06 DIAGNOSIS — R509 Fever, unspecified: Secondary | ICD-10-CM | POA: Diagnosis not present

## 2019-10-06 DIAGNOSIS — I480 Paroxysmal atrial fibrillation: Secondary | ICD-10-CM | POA: Diagnosis present

## 2019-10-06 DIAGNOSIS — R7881 Bacteremia: Secondary | ICD-10-CM | POA: Diagnosis present

## 2019-10-06 DIAGNOSIS — D696 Thrombocytopenia, unspecified: Secondary | ICD-10-CM | POA: Diagnosis not present

## 2019-10-06 DIAGNOSIS — I495 Sick sinus syndrome: Secondary | ICD-10-CM | POA: Diagnosis not present

## 2019-10-06 DIAGNOSIS — G9340 Encephalopathy, unspecified: Secondary | ICD-10-CM | POA: Diagnosis not present

## 2019-10-06 DIAGNOSIS — R69 Illness, unspecified: Secondary | ICD-10-CM | POA: Diagnosis not present

## 2019-10-06 DIAGNOSIS — R935 Abnormal findings on diagnostic imaging of other abdominal regions, including retroperitoneum: Secondary | ICD-10-CM | POA: Diagnosis not present

## 2019-10-06 DIAGNOSIS — R4182 Altered mental status, unspecified: Secondary | ICD-10-CM | POA: Diagnosis not present

## 2019-10-06 DIAGNOSIS — K810 Acute cholecystitis: Secondary | ICD-10-CM

## 2019-10-06 DIAGNOSIS — Z833 Family history of diabetes mellitus: Secondary | ICD-10-CM

## 2019-10-06 DIAGNOSIS — I639 Cerebral infarction, unspecified: Secondary | ICD-10-CM | POA: Diagnosis not present

## 2019-10-06 DIAGNOSIS — J189 Pneumonia, unspecified organism: Secondary | ICD-10-CM | POA: Diagnosis not present

## 2019-10-06 DIAGNOSIS — R000 Tachycardia, unspecified: Secondary | ICD-10-CM | POA: Diagnosis not present

## 2019-10-06 DIAGNOSIS — Z7901 Long term (current) use of anticoagulants: Secondary | ICD-10-CM | POA: Diagnosis not present

## 2019-10-06 DIAGNOSIS — J440 Chronic obstructive pulmonary disease with acute lower respiratory infection: Secondary | ICD-10-CM | POA: Diagnosis present

## 2019-10-06 DIAGNOSIS — R4020 Unspecified coma: Secondary | ICD-10-CM | POA: Diagnosis not present

## 2019-10-06 DIAGNOSIS — Z950 Presence of cardiac pacemaker: Secondary | ICD-10-CM | POA: Diagnosis not present

## 2019-10-06 DIAGNOSIS — K828 Other specified diseases of gallbladder: Secondary | ICD-10-CM | POA: Diagnosis not present

## 2019-10-06 DIAGNOSIS — N390 Urinary tract infection, site not specified: Secondary | ICD-10-CM | POA: Diagnosis present

## 2019-10-06 DIAGNOSIS — R932 Abnormal findings on diagnostic imaging of liver and biliary tract: Secondary | ICD-10-CM | POA: Diagnosis not present

## 2019-10-06 DIAGNOSIS — L0291 Cutaneous abscess, unspecified: Secondary | ICD-10-CM

## 2019-10-06 DIAGNOSIS — E1110 Type 2 diabetes mellitus with ketoacidosis without coma: Secondary | ICD-10-CM | POA: Diagnosis not present

## 2019-10-06 DIAGNOSIS — Z209 Contact with and (suspected) exposure to unspecified communicable disease: Secondary | ICD-10-CM | POA: Diagnosis not present

## 2019-10-06 DIAGNOSIS — E1111 Type 2 diabetes mellitus with ketoacidosis with coma: Secondary | ICD-10-CM | POA: Diagnosis present

## 2019-10-06 DIAGNOSIS — E876 Hypokalemia: Secondary | ICD-10-CM | POA: Diagnosis present

## 2019-10-06 LAB — CBC WITH DIFFERENTIAL/PLATELET
Abs Immature Granulocytes: 1.08 10*3/uL — ABNORMAL HIGH (ref 0.00–0.07)
Basophils Absolute: 0.1 10*3/uL (ref 0.0–0.1)
Basophils Relative: 0 %
Eosinophils Absolute: 0 10*3/uL (ref 0.0–0.5)
Eosinophils Relative: 0 %
HCT: 37.1 % (ref 36.0–46.0)
Hemoglobin: 11.9 g/dL — ABNORMAL LOW (ref 12.0–15.0)
Immature Granulocytes: 7 %
Lymphocytes Relative: 5 %
Lymphs Abs: 0.8 10*3/uL (ref 0.7–4.0)
MCH: 31.3 pg (ref 26.0–34.0)
MCHC: 32.1 g/dL (ref 30.0–36.0)
MCV: 97.6 fL (ref 80.0–100.0)
Monocytes Absolute: 0.9 10*3/uL (ref 0.1–1.0)
Monocytes Relative: 6 %
Neutro Abs: 12.1 10*3/uL — ABNORMAL HIGH (ref 1.7–7.7)
Neutrophils Relative %: 82 %
Platelets: 101 10*3/uL — ABNORMAL LOW (ref 150–400)
RBC: 3.8 MIL/uL — ABNORMAL LOW (ref 3.87–5.11)
RDW: 12.3 % (ref 11.5–15.5)
WBC Morphology: INCREASED
WBC: 14.9 10*3/uL — ABNORMAL HIGH (ref 4.0–10.5)
nRBC: 0 % (ref 0.0–0.2)

## 2019-10-06 LAB — BASIC METABOLIC PANEL
Anion gap: 15 (ref 5–15)
Anion gap: 14 (ref 5–15)
BUN: 21 mg/dL (ref 8–23)
BUN: 16 mg/dL (ref 8–23)
CO2: 11 mmol/L — ABNORMAL LOW (ref 22–32)
CO2: 14 mmol/L — ABNORMAL LOW (ref 22–32)
Calcium: 7.7 mg/dL — ABNORMAL LOW (ref 8.9–10.3)
Calcium: 8.2 mg/dL — ABNORMAL LOW (ref 8.9–10.3)
Chloride: 111 mmol/L (ref 98–111)
Chloride: 108 mmol/L (ref 98–111)
Creatinine, Ser: 1.05 mg/dL — ABNORMAL HIGH (ref 0.44–1.00)
Creatinine, Ser: 0.89 mg/dL (ref 0.44–1.00)
GFR calc Af Amer: >60 mL/min (ref >60)
GFR calc Af Amer: >60 mL/min (ref >60)
GFR calc non Af Amer: 53 mL/min — ABNORMAL LOW (ref >60)
GFR calc non Af Amer: >60 mL/min (ref >60)
Glucose, Bld: 325 mg/dL — ABNORMAL HIGH (ref 70–99)
Glucose, Bld: 186 mg/dL — ABNORMAL HIGH (ref 70–99)
Potassium: 3.3 mmol/L — ABNORMAL LOW (ref 3.5–5.1)
Potassium: 3.1 mmol/L — ABNORMAL LOW (ref 3.5–5.1)
Sodium: 137 mmol/L (ref 135–145)
Sodium: 136 mmol/L (ref 135–145)

## 2019-10-06 LAB — COMPREHENSIVE METABOLIC PANEL
ALT: 84 U/L — ABNORMAL HIGH (ref 0–44)
AST: 64 U/L — ABNORMAL HIGH (ref 15–41)
Albumin: 2.8 g/dL — ABNORMAL LOW (ref 3.5–5.0)
Alkaline Phosphatase: 118 U/L (ref 38–126)
Anion gap: 20 — ABNORMAL HIGH (ref 5–15)
BUN: 20 mg/dL (ref 8–23)
CO2: 9 mmol/L — ABNORMAL LOW (ref 22–32)
Calcium: 8.3 mg/dL — ABNORMAL LOW (ref 8.9–10.3)
Chloride: 104 mmol/L (ref 98–111)
Creatinine, Ser: 1.13 mg/dL — ABNORMAL HIGH (ref 0.44–1.00)
GFR calc Af Amer: 56 mL/min — ABNORMAL LOW (ref >60)
GFR calc non Af Amer: 49 mL/min — ABNORMAL LOW (ref >60)
Glucose, Bld: 369 mg/dL — ABNORMAL HIGH (ref 70–99)
Potassium: 3.3 mmol/L — ABNORMAL LOW (ref 3.5–5.1)
Sodium: 133 mmol/L — ABNORMAL LOW (ref 135–145)
Total Bilirubin: 1.5 mg/dL — ABNORMAL HIGH (ref 0.3–1.2)
Total Protein: 6.7 g/dL (ref 6.5–8.1)

## 2019-10-06 LAB — URINALYSIS, ROUTINE W REFLEX MICROSCOPIC
Bilirubin Urine: NEGATIVE
Glucose, UA: >=500 mg/dL — AB
Ketones, ur: 80 mg/dL — AB
Leukocytes,Ua: NEGATIVE
Nitrite: NEGATIVE
Protein, ur: 100 mg/dL — AB
Specific Gravity, Urine: 1.022 (ref 1.005–1.030)
pH: 5 (ref 5.0–8.0)

## 2019-10-06 LAB — LACTIC ACID, PLASMA
Lactic Acid, Venous: 1.7 mmol/L (ref 0.5–1.9)
Lactic Acid, Venous: 2.4 mmol/L (ref 0.5–1.9)

## 2019-10-06 LAB — SARS CORONAVIRUS 2 (TAT 6-24 HRS): SARS Coronavirus 2: NEGATIVE

## 2019-10-06 LAB — CBG MONITORING, ED
Glucose-Capillary: 413 mg/dL — ABNORMAL HIGH (ref 70–99)
Glucose-Capillary: 347 mg/dL — ABNORMAL HIGH (ref 70–99)
Glucose-Capillary: 314 mg/dL — ABNORMAL HIGH (ref 70–99)
Glucose-Capillary: 190 mg/dL — ABNORMAL HIGH (ref 70–99)
Glucose-Capillary: 206 mg/dL — ABNORMAL HIGH (ref 70–99)
Glucose-Capillary: 160 mg/dL — ABNORMAL HIGH (ref 70–99)
Glucose-Capillary: 147 mg/dL — ABNORMAL HIGH (ref 70–99)

## 2019-10-06 LAB — PROTIME-INR
INR: 1.2 (ref 0.8–1.2)
Prothrombin Time: 15.3 seconds — ABNORMAL HIGH (ref 11.4–15.2)

## 2019-10-06 LAB — APTT: aPTT: 38 seconds — ABNORMAL HIGH (ref 24–36)

## 2019-10-06 MED ORDER — DILTIAZEM HCL 30 MG PO TABS
30.0000 mg | ORAL_TABLET | Freq: Once | ORAL | Status: AC
  Administered 2019-10-06: 30 mg via ORAL
  Filled 2019-10-06: qty 1

## 2019-10-06 MED ORDER — SODIUM CHLORIDE 0.9 % IV SOLN
INTRAVENOUS | Status: DC
  Administered 2019-10-07: 13:00:00 via INTRAVENOUS

## 2019-10-06 MED ORDER — SODIUM CHLORIDE 0.9 % IV SOLN
2.0000 g | INTRAVENOUS | Status: DC
  Administered 2019-10-06 – 2019-10-07 (×3): 2 g via INTRAVENOUS
  Filled 2019-10-06 (×5): qty 2000
  Filled 2019-10-06: qty 2

## 2019-10-06 MED ORDER — DEXTROSE-NACL 5-0.45 % IV SOLN
INTRAVENOUS | Status: DC
  Administered 2019-10-06: 19:00:00 via INTRAVENOUS

## 2019-10-06 MED ORDER — LACTATED RINGERS IV BOLUS (SEPSIS)
500.0000 mL | Freq: Once | INTRAVENOUS | Status: AC
  Administered 2019-10-06: 500 mL via INTRAVENOUS

## 2019-10-06 MED ORDER — POTASSIUM CHLORIDE 10 MEQ/100ML IV SOLN
10.0000 meq | INTRAVENOUS | Status: AC
  Administered 2019-10-06 (×3): 10 meq via INTRAVENOUS
  Filled 2019-10-06 (×3): qty 100

## 2019-10-06 MED ORDER — DEXTROSE 5 % IV SOLN
550.0000 mg | Freq: Once | INTRAVENOUS | Status: AC
  Administered 2019-10-06: 550 mg via INTRAVENOUS
  Filled 2019-10-06: qty 11

## 2019-10-06 MED ORDER — INSULIN REGULAR(HUMAN) IN NACL 100-0.9 UT/100ML-% IV SOLN
INTRAVENOUS | Status: DC
  Administered 2019-10-06: 3.5 [IU]/h via INTRAVENOUS
  Filled 2019-10-06: qty 100

## 2019-10-06 MED ORDER — VANCOMYCIN HCL 10 G IV SOLR
1250.0000 mg | Freq: Once | INTRAVENOUS | Status: AC
  Administered 2019-10-06: 1250 mg via INTRAVENOUS
  Filled 2019-10-06: qty 1250

## 2019-10-06 MED ORDER — METOPROLOL TARTRATE 50 MG PO TABS
50.0000 mg | ORAL_TABLET | Freq: Two times a day (BID) | ORAL | Status: DC
  Administered 2019-10-07 – 2019-10-08 (×2): 50 mg via ORAL
  Filled 2019-10-06 (×3): qty 1

## 2019-10-06 MED ORDER — LACTATED RINGERS IV SOLN
INTRAVENOUS | Status: DC
  Administered 2019-10-06: 17:00:00 via INTRAVENOUS

## 2019-10-06 MED ORDER — FLECAINIDE ACETATE 50 MG PO TABS
50.0000 mg | ORAL_TABLET | Freq: Two times a day (BID) | ORAL | Status: DC
  Administered 2019-10-07 – 2019-10-18 (×22): 50 mg via ORAL
  Filled 2019-10-06 (×23): qty 1

## 2019-10-06 MED ORDER — SODIUM CHLORIDE 0.9 % IV SOLN
2.0000 g | Freq: Two times a day (BID) | INTRAVENOUS | Status: DC
  Administered 2019-10-07 – 2019-10-08 (×3): 2 g via INTRAVENOUS
  Filled 2019-10-06: qty 20
  Filled 2019-10-06: qty 2
  Filled 2019-10-06 (×4): qty 20

## 2019-10-06 MED ORDER — INSULIN REGULAR(HUMAN) IN NACL 100-0.9 UT/100ML-% IV SOLN
INTRAVENOUS | Status: DC
  Administered 2019-10-07: 3.7 [IU]/h via INTRAVENOUS

## 2019-10-06 MED ORDER — VANCOMYCIN HCL IN DEXTROSE 750-5 MG/150ML-% IV SOLN: 750.0000 mg | INTRAVENOUS | Status: DC

## 2019-10-06 MED ORDER — INSULIN REGULAR BOLUS VIA INFUSION
0.0000 [IU] | Freq: Three times a day (TID) | INTRAVENOUS | Status: DC
  Filled 2019-10-06: qty 10

## 2019-10-06 MED ORDER — SODIUM CHLORIDE 0.9 % IV SOLN
2.0000 g | Freq: Once | INTRAVENOUS | Status: AC
  Administered 2019-10-06: 2 g via INTRAVENOUS
  Filled 2019-10-06: qty 20

## 2019-10-06 MED ORDER — LACTATED RINGERS IV BOLUS (SEPSIS)
1000.0000 mL | Freq: Once | INTRAVENOUS | Status: AC
  Administered 2019-10-06: 1000 mL via INTRAVENOUS

## 2019-10-06 MED ORDER — DEXTROSE-NACL 5-0.45 % IV SOLN
INTRAVENOUS | Status: DC
  Administered 2019-10-07: 01:00:00 via INTRAVENOUS

## 2019-10-06 MED ORDER — DILTIAZEM HCL-DEXTROSE 125-5 MG/125ML-% IV SOLN (PREMIX)
5.0000 mg/h | INTRAVENOUS | Status: DC
  Filled 2019-10-06: qty 125

## 2019-10-06 MED ORDER — DEXTROSE 50 % IV SOLN: 25.0000 mL | INTRAVENOUS | Status: DC | PRN

## 2019-10-06 NOTE — ED Notes (Signed)
Reported to EDP pt is ST just over 100. At this time we will hold card drip, give oral. We will dc LR.

## 2019-10-06 NOTE — ED Notes (Signed)
Dr Kathrynn Humble is speaking to a family member on the phone at this time.

## 2019-10-06 NOTE — Progress Notes (Signed)
Dear Doctor: This patient has been identified as a candidate for PICC for the following reason (s): IV therapy over 48 hours, drug pH or osmolality (causing phlebitis, infiltration in 24 hours), drug extravasation potential with tissue necrosis (KCL, Dilantin, Dopamine, CaCl, MgSO4, chemo vesicant), poor veins/poor circulatory system (CHF, COPD, emphysema, diabetes, steroid use, IV drug abuse, etc.), restarts due to phlebitis and infiltration in 24 hours and incompatible drugs (aminophyllin, TPN, heparin, given with an antibiotic) If you agree, please write an order for the indicated device. For any questions contact the Vascular Access Team at [phone] if no answer, please leave a message.  Thank you for supporting the early vascular access assessment program.

## 2019-10-06 NOTE — ED Notes (Signed)
Purewick has been placed. Pt. Felt warm upon touch, temp was taken, reading 100.0 F

## 2019-10-06 NOTE — Progress Notes (Signed)
Pharmacy Antibiotic Note  Courtney [name] is a 73 y.o. female admitted on 10/06/2019 with Sepsis/Meningits.  Pharmacy has been consulted for Vancomycin dosing.  Height: 5\' 1"  (154.9 cm) Weight: 120 lb (54.4 kg) IBW/kg (Calculated) : 47.8  Temp (24hrs), Avg:99.1 F (37.3 C), Min:98.2 F (36.8 C), Max:100 F (37.8 C)  Recent Labs  Lab 10/06/19 1240  WBC 14.9*  CREATININE 1.13*  LATICACIDVEN 1.7    Estimated Creatinine Clearance: 34 mL/min (A) (by C-G formula based on SCr of 1.13 mg/dL (H)).    No Known Allergies  Antimicrobials this admission: 10/24 Acyclovir >> 10/24 Ampicillin >>  10/24 Ceftriaxone >>  10/24 Vancomycin >>    Microbiology results: 10/24 BCx: Pending  10/24 UCx: Pending    Paln: - Treating for possible meningitis will target a goal trough of 15-20  - Will give the patient a loading dose of Vancomycin 1250 mg IV x 1 dose  - Will continue with Vancomycin 750 mg IV q24h after the loading dose - Will monitor patients renal function  Thank you for allowing pharmacy to be a part of this patient's care.  Duanne Limerick 10/06/2019 4:12 PM

## 2019-10-06 NOTE — ED Notes (Addendum)
Attempted to get son, Karen Chafe,  from waiting room. Nurse first is aware.

## 2019-10-06 NOTE — ED Notes (Signed)
Called pharm for cardizem  

## 2019-10-06 NOTE — ED Notes (Signed)
Consulting ED pharm to check comp and prioritize medications

## 2019-10-06 NOTE — Progress Notes (Signed)
Notified bedside nurse of need to order lactic acid.  Second lactic higher than 1st value. Secure chat has been sent to bedside RN asking for them to obtain and order for a repeat.

## 2019-10-06 NOTE — ED Notes (Signed)
EDP MD aware of bp. Advised to continue with LR bolus and he will reevaluate. Will continue to monitor pt.

## 2019-10-06 NOTE — ED Notes (Signed)
Called pharm to consult running potassium and 5%dextrose/normal saline,

## 2019-10-06 NOTE — H&P (Signed)
TRH H&P    Patient Demographics:    Courtney [name], is a 73 y.o. female  MRN: [id number]  DOB - 20-Apr-1946  Admit Date - 10/06/2019  Referring MD/NP/PA: Dr. Kathrynn Humble  Outpatient Primary MD for the patient is Patient, No Pcp Per  Patient coming from: Home  Chief complaint-altered mental status   HPI:    Courtney [name]  is a 73 y.o. female, with history of diabetes mellitus type 2, atrial fibrillation, tachybradycardia syndrome who was brought to the ED with chief complaints of altered mental status.  EMS was called by family members for altered mental status.  She was found to be febrile and received 1 g of Tylenol in route.  In the ED patient initially was altered and was unable to provide history.  Later her mental status improved.  She is reported responding to questions.  History is limited due to mental status change and language barrier. She does complain of headache. She was found to be in DKA, started on IV insulin.  Does this finding She was started on vancomycin, ampicillin, ceftriaxone empirically for meningitis.  Though she does not have any signs of meningismus.  LP could not be performed as patient is on Eliquis.     Review of systems:    In addition to the HPI above,   All other systems reviewed and are negative.    Past History of the following :    Past Medical History:  Diagnosis Date  . Allergy   . Diabetes mellitus type 2, diet-controlled ([place])   . PAF (paroxysmal atrial fibrillation) ([place]) 09/04/2017  . Sinus bradycardia 09/04/2017   HR 30s at times  . Tachy-brady syndrome ([place]) 09/04/2017      Past Surgical History:  Procedure Laterality Date  . PACEMAKER IMPLANT N/A 09/05/2017   Procedure: Pacemaker Implant;  Surgeon: Deboraha Sprang, MD;  Location: [facility];  Service: Cardiovascular;  Laterality: N/A;      Social History:      Social History   Tobacco Use  . Smoking  status: Current Every Day Smoker  . Smokeless tobacco: Never Used  Substance Use Topics  . Alcohol use: No    Frequency: Never       Family History :     Family History  Problem Relation Age of Onset  . Diabetes Son      Home Medications:   Prior to Admission medications   Medication Sig Start Date End Date Taking? Authorizing Provider  acetaminophen (TYLENOL) 325 MG tablet Take 1-2 tablets (325-650 mg total) by mouth every 6 (six) hours as needed for mild pain or moderate pain. 09/06/17  Yes Baldwin Jamaica, PA-C  apixaban (ELIQUIS) 5 MG TABS tablet Take 1 tablet (5 mg total) by mouth 2 (two) times daily. 07/26/19  Yes Deboraha Sprang, MD  flecainide (TAMBOCOR) 50 MG tablet Take 1 tablet (50 mg total) by mouth 2 (two) times daily. 07/26/19  Yes Deboraha Sprang, MD  metoprolol tartrate (LOPRESSOR) 50 MG tablet Take 1 tablet (50 mg  total) by mouth 2 (two) times daily. Please keep upcoming appt in September with Dr. Caryl Comes before anymore refills. Thank you 08/31/19  Yes Deboraha Sprang, MD  albuterol (PROVENTIL HFA;VENTOLIN HFA) 108 (90 Base) MCG/ACT inhaler Inhale 2 puffs into the lungs every 4 (four) hours as needed for wheezing or shortness of breath (cough, shortness of breath or wheezing.). Patient not taking: Reported on 10/06/2019 09/06/17   Baldwin Jamaica, PA-C  glucose monitoring kit (FREESTYLE) monitoring kit Check cbgs bid as instructed by pcp. 01/24/16   Shawnee Knapp, MD     Allergies:    No Known Allergies   Physical Exam:   Vitals  Blood pressure 108/63, pulse 71, temperature 98.2 F (36.8 C), temperature source Oral, resp. rate (!) 28, height '5\' 1"'$  (1.549 m), weight 54.4 kg, SpO2 100 %.  1.  General: Appears in no acute distress  2. Psychiatric: Alert, responding to questions  3. Neurologic: Cranial nerves II through XII grossly intact, moving all extremities, no focal deficit noted  4. HEENMT:  Atraumatic normocephalic, extraocular muscle intact   5.  Respiratory : Clear to auscultation bilaterally  6. Cardiovascular : S1-S2, regular, no murmur auscultated  7. Gastrointestinal:  Abdomen soft, nontender, no organomegaly  8. Skin:  No rashes noted      Data Review:    CBC Recent Labs  Lab 10/06/19 1240  WBC 14.9*  HGB 11.9*  HCT 37.1  PLT 101*  MCV 97.6  MCH 31.3  MCHC 32.1  RDW 12.3  LYMPHSABS 0.8  MONOABS 0.9  EOSABS 0.0  BASOSABS 0.1   ------------------------------------------------------------------------------------------------------------------  Results for orders placed or performed during the hospital encounter of 10/06/19 (from the past 48 hour(s))  Lactic acid, plasma     Status: None   Collection Time: 10/06/19 12:40 PM  Result Value Ref Range   Lactic Acid, Venous 1.7 0.5 - 1.9 mmol/L    Comment: Performed at [facility] [address], [place], [place] [address]  Comprehensive metabolic panel     Status: Abnormal   Collection Time: 10/06/19 12:40 PM  Result Value Ref Range   Sodium 133 (L) 135 - 145 mmol/L   Potassium 3.3 (L) 3.5 - 5.1 mmol/L   Chloride 104 98 - 111 mmol/L   CO2 9 (L) 22 - 32 mmol/L   Glucose, Bld 369 (H) 70 - 99 mg/dL   BUN 20 8 - 23 mg/dL   Creatinine, Ser 1.13 (H) 0.44 - 1.00 mg/dL   Calcium 8.3 (L) 8.9 - 10.3 mg/dL   Total Protein 6.7 6.5 - 8.1 g/dL   Albumin 2.8 (L) 3.5 - 5.0 g/dL   AST 64 (H) 15 - 41 U/L   ALT 84 (H) 0 - 44 U/L   Alkaline Phosphatase 118 38 - 126 U/L   Total Bilirubin 1.5 (H) 0.3 - 1.2 mg/dL   GFR calc non Af Amer 49 (L) >60 mL/min   GFR calc Af Amer 56 (L) >60 mL/min   Anion gap 20 (H) 5 - 15    Comment: Performed at [facility], [place] [address] [place], [place] [address]  CBC WITH DIFFERENTIAL     Status: Abnormal   Collection Time: 10/06/19 12:40 PM  Result Value Ref Range   WBC 14.9 (H) 4.0 - 10.5 K/uL   RBC 3.80 (L) 3.87 - 5.11 MIL/uL   Hemoglobin 11.9 (L) 12.0 - 15.0 g/dL   HCT 37.1 36.0 - 46.0 %   MCV 97.6  80.0 - 100.0 fL    MCH 31.3 26.0 - 34.0 pg   MCHC 32.1 30.0 - 36.0 g/dL   RDW 12.3 11.5 - 15.5 %   Platelets 101 (L) 150 - 400 K/uL    Comment: REPEATED TO VERIFY PLATELET COUNT CONFIRMED BY SMEAR Immature Platelet Fraction may be clinically indicated, consider ordering this additional test [id number]    nRBC 0.0 0.0 - 0.2 %   Neutrophils Relative % 82 %   Neutro Abs 12.1 (H) 1.7 - 7.7 K/uL   Lymphocytes Relative 5 %   Lymphs Abs 0.8 0.7 - 4.0 K/uL   Monocytes Relative 6 %   Monocytes Absolute 0.9 0.1 - 1.0 K/uL   Eosinophils Relative 0 %   Eosinophils Absolute 0.0 0.0 - 0.5 K/uL   Basophils Relative 0 %   Basophils Absolute 0.1 0.0 - 0.1 K/uL   WBC Morphology INCREASED BANDS (>20% BANDS)    Immature Granulocytes 7 %   Abs Immature Granulocytes 1.08 (H) 0.00 - 0.07 K/uL   Dohle Bodies PRESENT     Comment: Performed at [facility], [address], [place], [place] [address]  APTT     Status: Abnormal   Collection Time: 10/06/19 12:40 PM  Result Value Ref Range   aPTT 38 (H) 24 - 36 seconds    Comment:        IF BASELINE aPTT IS ELEVATED, SUGGEST PATIENT RISK ASSESSMENT BE USED TO DETERMINE APPROPRIATE ANTICOAGULANT THERAPY. Performed at [facility], [place] [address] [place], [place] [address]   Protime-INR     Status: Abnormal   Collection Time: 10/06/19 12:40 PM  Result Value Ref Range   Prothrombin Time 15.3 (H) 11.4 - 15.2 seconds   INR 1.2 0.8 - 1.2    Comment: (NOTE) INR goal varies based on device and disease states. Performed at [facility], [place] [address] [place], [place] [address]   POC CBG, ED     Status: Abnormal   Collection Time: 10/06/19  2:47 PM  Result Value Ref Range   Glucose-Capillary 413 (H) 70 - 99 mg/dL  Urinalysis, Routine w reflex microscopic     Status: Abnormal   Collection Time: 10/06/19  4:02 PM  Result Value Ref Range   Color, Urine STRAW (A) YELLOW   APPearance CLEAR CLEAR   Specific Gravity, Urine 1.022 1.005 - 1.030   pH 5.0  5.0 - 8.0   Glucose, UA >=500 (A) NEGATIVE mg/dL   Hgb urine dipstick MODERATE (A) NEGATIVE   Bilirubin Urine NEGATIVE NEGATIVE   Ketones, ur 80 (A) NEGATIVE mg/dL   Protein, ur 100 (A) NEGATIVE mg/dL   Nitrite NEGATIVE NEGATIVE   Leukocytes,Ua NEGATIVE NEGATIVE   RBC / HPF 0-5 0 - 5 RBC/hpf   Bacteria, UA RARE (A) NONE SEEN    Comment: Performed at [facility], [place] [address], [place], [place] [address]  POC CBG, ED     Status: Abnormal   Collection Time: 10/06/19  4:06 PM  Result Value Ref Range   Glucose-Capillary 347 (H) 70 - 99 mg/dL  Lactic acid, plasma     Status: Abnormal   Collection Time: 10/06/19  4:33 PM  Result Value Ref Range   Lactic Acid, Venous 2.4 (HH) 0.5 - 1.9 mmol/L    Comment: CRITICAL RESULT CALLED TO, READ BACK BY AND VERIFIED WITH: E.TAYLOR,RN @ 0354 10/06/2019 [place] Performed at [facility], [place] [address] [place],  [address]  Basic metabolic panel     Status: Abnormal   Collection Time: 10/06/19  4:33 PM  Result Value Ref Range   Sodium 137 135 - 145 mmol/L   Potassium 3.3 (L) 3.5 - 5.1 mmol/L   Chloride 111 98 - 111 mmol/L   CO2 11 (L) 22 - 32 mmol/L   Glucose, Bld 325 (H) 70 - 99 mg/dL   BUN 21 8 - 23 mg/dL   Creatinine, Ser 1.05 (H) 0.44 - 1.00 mg/dL   Calcium 7.7 (L) 8.9 - 10.3 mg/dL   GFR calc non Af Amer 53 (L) >60 mL/min   GFR calc Af Amer >60 >60 mL/min   Anion gap 15 5 - 15    Comment: Performed at [facility] [address], [place],  [address]  POC CBG, ED     Status: Abnormal   Collection Time: 10/06/19  5:21 PM  Result Value Ref Range   Glucose-Capillary 314 (H) 70 - 99 mg/dL    Chemistries  Recent Labs  Lab 10/06/19 1240 10/06/19 1633  NA 133* 137  K 3.3* 3.3*  CL 104 111  CO2 9* 11*  GLUCOSE 369* 325*  BUN 20 21  CREATININE 1.13* 1.05*  CALCIUM 8.3* 7.7*  AST 64*  --   ALT 84*  --   ALKPHOS 118  --   BILITOT 1.5*  --     ------------------------------------------------------------------------------------------------------------------  ------------------------------------------------------------------------------------------------------------------ GFR: Estimated Creatinine Clearance: 36.5 mL/min (A) (by C-G formula based on SCr of 1.05 mg/dL (H)). Liver Function Tests: Recent Labs  Lab 10/06/19 1240  AST 64*  ALT 84*  ALKPHOS 118  BILITOT 1.5*  PROT 6.7  ALBUMIN 2.8*   No results for input(s): LIPASE, AMYLASE in the last 168 hours. No results for input(s): AMMONIA in the last 168 hours. Coagulation Profile: Recent Labs  Lab 10/06/19 1240  INR 1.2   Cardiac Enzymes: No results for input(s): CKTOTAL, CKMB, CKMBINDEX, TROPONINI in the last 168 hours. BNP (last 3 results) No results for input(s): PROBNP in the last 8760 hours. HbA1C: No results for input(s): HGBA1C in the last 72 hours. CBG: Recent Labs  Lab 10/06/19 1447 10/06/19 1606 10/06/19 1721  GLUCAP 413* 347* 314*   Lipid Profile: No results for input(s): CHOL, HDL, LDLCALC, TRIG, CHOLHDL, LDLDIRECT in the last 72 hours. Thyroid Function Tests: No results for input(s): TSH, T4TOTAL, FREET4, T3FREE, THYROIDAB in the last 72 hours. Anemia Panel: No results for input(s): VITAMINB12, FOLATE, FERRITIN, TIBC, IRON, RETICCTPCT in the last 72 hours.  --------------------------------------------------------------------------------------------------------------- Urine analysis:    Component Value Date/Time   COLORURINE STRAW (A) 10/06/2019 1602   APPEARANCEUR CLEAR 10/06/2019 1602   LABSPEC 1.022 10/06/2019 1602   PHURINE 5.0 10/06/2019 1602   GLUCOSEU >=500 (A) 10/06/2019 1602   HGBUR MODERATE (A) 10/06/2019 1602   BILIRUBINUR NEGATIVE 10/06/2019 1602   BILIRUBINUR neg 05/05/2015 1038   KETONESUR 80 (A) 10/06/2019 1602   PROTEINUR 100 (A) 10/06/2019 1602   UROBILINOGEN 0.2 05/05/2015 1038   NITRITE NEGATIVE 10/06/2019 1602    LEUKOCYTESUR NEGATIVE 10/06/2019 1602      Imaging Results:    Ct Head Wo Contrast  Result Date: 10/06/2019 CLINICAL DATA:  Altered level of consciousness and fever EXAM: CT HEAD WITHOUT CONTRAST TECHNIQUE: Contiguous axial images were obtained from the base of the skull through the vertex without intravenous contrast. COMPARISON:  None. FINDINGS: Brain: The ventricles are normal in size and configuration. There is no intracranial mass, hemorrhage, extra-axial fluid collection,  or midline shift. Brain parenchyma appears unremarkable. No acute infarct evident. Vascular: There is no hyperdense vessel. There is calcification in each carotid siphon region. Skull: The bony calvarium appears intact. Sinuses/Orbits: There is mucosal thickening in several ethmoid air cells. Other visualized paranasal sinuses are clear. Visualized orbits appear symmetric bilaterally. Other: Mastoid air cells are clear. IMPRESSION: Brain parenchyma appears unremarkable. No evident acute infarct. No mass or hemorrhage. There are foci of arterial vascular calcification. There is mucosal thickening in several ethmoid air cells. Electronically Signed   By: Lowella Grip III M.D.   On: 10/06/2019 14:55   Dg Chest Port 1 View  Result Date: 10/06/2019 CLINICAL DATA:  Altered mental status EXAM: PORTABLE CHEST 1 VIEW COMPARISON:  09/06/2017 FINDINGS: There is no focal consolidation. There is no pleural effusion or pneumothorax. The heart and mediastinal contours are unremarkable. 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 There is thoracic aortic atherosclerosis. There is no acute osseous abnormality. IMPRESSION: No active disease. Electronically Signed   By: Kathreen Devoid   On: 10/06/2019 12:49    My personal review of EKG: Rhythm NSR, no ST-T changes.   Assessment & Plan:    Active Problems:   * No active hospital problems. *   1. Altered mental status-improved, likely from DKA.  Questionable underlying sepsis, unclear  etiology.  Patient empirically started on vancomycin, ceftriaxone, ampicillin for meningitis.  LP could not be performed as patient is on anticoagulation with Eliquis.  CT head is unremarkable.  Patient is improving.  We will continue with empiric antibiotics at this time, follow blood culture urine culture results.  Initial lactic acid 1.7, repeat lactic acid 2.4.  2. Sepsis-unclear source, started empirically on antibiotics as above for meningitis.  Follow blood culture and urine culture results.  3. Atrial fibrillation with RVR-patient found to be in A. fib with RVR, will start on Cardizem 5 mg/h.  Titrate to keep heart rate less than 100.  Will hold Eliquis at this time, in case patient will need LP to rule out meningitis.  4. Tachybradycardia syndrome-continue flecainide, metoprolol.  Will start this medication from tomorrow morning.  5. DKA-patient found to be in diabetic ketoacidosis.  Anion gap 20.  Started on IV insulin, DKA protocol.  Check BMP every 4 hours.    DVT Prophylaxis-   Apixaban  AM Labs Ordered, also please review Full Orders  Family Communication: No family at bedside  Code Status: Full code  Admission status: Inpatient: Based on patients clinical presentation and evaluation of above clinical data, I have made determination that patient meets Inpatient criteria at this time.  Time spent in minutes : 60 minutes   Oswald Hillock M.D on 10/06/2019 at 5:49 PM

## 2019-10-06 NOTE — ED Triage Notes (Signed)
Pt arrives via ems, sick for the last 3 days. AMS, fatigue and malaise. Today pt is more altered. Pt responds to name and will follow commands. 20g L wrist. Given 1000mg  tylenol pta with 500cc NS en route.  CBG 345, 130/70, HR 68 with a demand pacemaker. 12 lead unremarkable. RR 33 with RA saturation 90 with improvement to 100% on 4L [place]. 101.3 F.

## 2019-10-06 NOTE — ED Notes (Signed)
Reported increase hr to EDP, he said admitting was coming. EDP said give amp, van, acyclovir.

## 2019-10-06 NOTE — ED Provider Notes (Addendum)
[facility] [place] EMERGENCY DEPARTMENT Provider Note   CSN: [id number] Arrival date & time: 10/06/19  1144     History   Chief Complaint Chief Complaint  Patient presents with  . AMS    HPI Courtney [name] is a 73 y.o. female.     HPI Level 5 caveat for altered mental status.  73 year old female comes in with chief complaint of altered mental status.  Patient has history of paroxysmal A. fib on Eliquis, diabetes, tachybradycardia syndrome.  I called patient's emergency contact at [phone] and left a message.  Per EMS, patient has been unwell for the last 3 days.  Today she is altered and therefore EMS was called.  Patient was febrile and she had received 1000 mg of Tylenol in route.  Patient is oriented to self and follows a few simple commands.  She is noted to be moving all 4 extremities.  Translation service was utilized, but patient did not really cooperate much.  Past Medical History:  Diagnosis Date  . Allergy   . Diabetes mellitus type 2, diet-controlled ([place])   . PAF (paroxysmal atrial fibrillation) ([place]) 09/04/2017  . Sinus bradycardia 09/04/2017   HR 30s at times  . Tachy-brady syndrome ([place]) 09/04/2017    Patient Active Problem List   Diagnosis Date Noted  . Pacemaker 05/29/2018  . Chronic anticoagulation 05/29/2018  . Paroxysmal atrial fibrillation ([place]) 09/04/2017  . Tachy-brady syndrome ([place])   . Chest pain   . Dizziness   . Type 2 NIDDM-untreated 01/18/2016  . Chronic obstructive pulmonary disease ([place]) 01/18/2016  . Tobacco use disorder 01/18/2016  . Overweight (BMI 25.0-29.9) 01/18/2016    Past Surgical History:  Procedure Laterality Date  . PACEMAKER IMPLANT N/A 09/05/2017   Procedure: Pacemaker Implant;  Surgeon: Deboraha Sprang, MD;  Location: [facility];  Service: Cardiovascular;  Laterality: N/A;     OB History   No obstetric history on file.      Home Medications    Prior to Admission medications   Medication  Sig Start Date End Date Taking? Authorizing Provider  acetaminophen (TYLENOL) 325 MG tablet Take 1-2 tablets (325-650 mg total) by mouth every 6 (six) hours as needed for mild pain or moderate pain. 09/06/17  Yes Baldwin Jamaica, PA-C  apixaban (ELIQUIS) 5 MG TABS tablet Take 1 tablet (5 mg total) by mouth 2 (two) times daily. 07/26/19  Yes Deboraha Sprang, MD  flecainide (TAMBOCOR) 50 MG tablet Take 1 tablet (50 mg total) by mouth 2 (two) times daily. 07/26/19  Yes Deboraha Sprang, MD  metoprolol tartrate (LOPRESSOR) 50 MG tablet Take 1 tablet (50 mg total) by mouth 2 (two) times daily. Please keep upcoming appt in September with Dr. Caryl Comes before anymore refills. Thank you 08/31/19  Yes Deboraha Sprang, MD  albuterol (PROVENTIL HFA;VENTOLIN HFA) 108 (90 Base) MCG/ACT inhaler Inhale 2 puffs into the lungs every 4 (four) hours as needed for wheezing or shortness of breath (cough, shortness of breath or wheezing.). Patient not taking: Reported on 10/06/2019 09/06/17   Baldwin Jamaica, PA-C  glucose monitoring kit (FREESTYLE) monitoring kit Check cbgs bid as instructed by pcp. 01/24/16   Shawnee Knapp, MD    Family History Family History  Problem Relation Age of Onset  . Diabetes Son     Social History Social History   Tobacco Use  . Smoking status: Current Every Day Smoker  . Smokeless tobacco: Never Used  Substance Use Topics  .  Alcohol use: No    Frequency: Never  . Drug use: No     Allergies   Patient has no known allergies.   Review of Systems Review of Systems  Unable to perform ROS: Mental status change     Physical Exam Updated Vital Signs BP (!) 86/51   Pulse 69   Temp 100 F (37.8 C) (Oral)   Resp (!) 26   Ht '5\' 1"'$  (1.549 m)   Wt 54.4 kg   SpO2 100%   BMI 22.67 kg/m   Physical Exam Vitals signs and nursing note reviewed.  Constitutional:      Appearance: She is well-developed.     Comments: Disoriented  HENT:     Head: Atraumatic.  Eyes:     Pupils:  Pupils are equal, round, and reactive to light.  Neck:     Musculoskeletal: Neck supple.     Comments: No meningismus Cardiovascular:     Rate and Rhythm: Normal rate.  Pulmonary:     Effort: Pulmonary effort is normal.  Abdominal:     General: Bowel sounds are normal.  Skin:    General: Skin is warm and dry.  Neurological:     Mental Status: She is disoriented.     Comments: Moving all 4 extremities, oriented to self, no meningismus      ED Treatments / Results  Labs (all labs ordered are listed, but only abnormal results are displayed) Labs Reviewed  COMPREHENSIVE METABOLIC PANEL - Abnormal; Notable for the following components:      Result Value   Sodium 133 (*)    Potassium 3.3 (*)    CO2 9 (*)    Glucose, Bld 369 (*)    Creatinine, Ser 1.13 (*)    Calcium 8.3 (*)    Albumin 2.8 (*)    AST 64 (*)    ALT 84 (*)    Total Bilirubin 1.5 (*)    GFR calc non Af Amer 49 (*)    GFR calc Af Amer 56 (*)    Anion gap 20 (*)    All other components within normal limits  CBC WITH DIFFERENTIAL/PLATELET - Abnormal; Notable for the following components:   WBC 14.9 (*)    RBC 3.80 (*)    Hemoglobin 11.9 (*)    Platelets 101 (*)    Neutro Abs 12.1 (*)    Abs Immature Granulocytes 1.08 (*)    All other components within normal limits  APTT - Abnormal; Notable for the following components:   aPTT 38 (*)    All other components within normal limits  PROTIME-INR - Abnormal; Notable for the following components:   Prothrombin Time 15.3 (*)    All other components within normal limits  CBG MONITORING, ED - Abnormal; Notable for the following components:   Glucose-Capillary 413 (*)    All other components within normal limits  CULTURE, BLOOD (ROUTINE X 2)  CULTURE, BLOOD (ROUTINE X 2)  URINE CULTURE  SARS CORONAVIRUS 2 (TAT 6-24 HRS)  LACTIC ACID, PLASMA  LACTIC ACID, PLASMA  URINALYSIS, ROUTINE W REFLEX MICROSCOPIC  BASIC METABOLIC PANEL  BASIC METABOLIC PANEL  BASIC  METABOLIC PANEL  CBG MONITORING, ED    EKG EKG Interpretation  Date/Time:  Saturday October 06 2019 13:18:11 EDT Ventricular Rate:  78 PR Interval:    QRS Duration: 93 QT Interval:  438 QTC Calculation: 499 R Axis:   4 Text Interpretation:  Sinus rhythm Minimal ST depression, lateral leads Borderline prolonged QT  interval No acute changes No significant change since last tracing Confirmed by Varney Biles [phone]) on 10/06/2019 2:52:57 PM   Radiology Ct Head Wo Contrast  Result Date: 10/06/2019 CLINICAL DATA:  Altered level of consciousness and fever EXAM: CT HEAD WITHOUT CONTRAST TECHNIQUE: Contiguous axial images were obtained from the base of the skull through the vertex without intravenous contrast. COMPARISON:  None. FINDINGS: Brain: The ventricles are normal in size and configuration. There is no intracranial mass, hemorrhage, extra-axial fluid collection, or midline shift. Brain parenchyma appears unremarkable. No acute infarct evident. Vascular: There is no hyperdense vessel. There is calcification in each carotid siphon region. Skull: The bony calvarium appears intact. Sinuses/Orbits: There is mucosal thickening in several ethmoid air cells. Other visualized paranasal sinuses are clear. Visualized orbits appear symmetric bilaterally. Other: Mastoid air cells are clear. IMPRESSION: Brain parenchyma appears unremarkable. No evident acute infarct. No mass or hemorrhage. There are foci of arterial vascular calcification. There is mucosal thickening in several ethmoid air cells. Electronically Signed   By: Lowella Grip III M.D.   On: 10/06/2019 14:55   Dg Chest Port 1 View  Result Date: 10/06/2019 CLINICAL DATA:  Altered mental status EXAM: PORTABLE CHEST 1 VIEW COMPARISON:  09/06/2017 FINDINGS: There is no focal consolidation. There is no pleural effusion or pneumothorax. The heart and mediastinal contours are unremarkable. 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 There is  thoracic aortic atherosclerosis. There is no acute osseous abnormality. IMPRESSION: No active disease. Electronically Signed   By: Kathreen Devoid   On: 10/06/2019 12:49    Procedures .Critical Care Performed by: Varney Biles, MD Authorized by: Varney Biles, MD   Critical care provider statement:    Critical care time (minutes):  100   Critical care was necessary to treat or prevent imminent or life-threatening deterioration of the following conditions:  CNS failure or compromise   Critical care was time spent personally by me on the following activities:  Discussions with consultants, evaluation of patient's response to treatment, examination of patient, ordering and performing treatments and interventions, ordering and review of laboratory studies, ordering and review of radiographic studies, pulse oximetry, re-evaluation of patient's condition, obtaining history from patient or surrogate and review of old charts   (including critical care time)  Medications Ordered in ED Medications  lactated ringers bolus 1,000 mL (1,000 mLs Intravenous New Bag/Given 10/06/19 1503)    And  lactated ringers bolus 500 mL (500 mLs Intravenous New Bag/Given 10/06/19 1603)  insulin regular, human (MYXREDLIN) 100 units/ 100 mL infusion (3.5 Units/hr Intravenous New Bag/Given 10/06/19 1507)  potassium chloride 10 mEq in 100 mL IVPB (has no administration in time range)  dextrose 5 %-0.45 % sodium chloride infusion (has no administration in time range)  lactated ringers infusion (has no administration in time range)  acyclovir (ZOVIRAX) 550 mg in dextrose 5 % 100 mL IVPB (has no administration in time range)  cefTRIAXone (ROCEPHIN) 2 g in sodium chloride 0.9 % 100 mL IVPB (0 g Intravenous Stopped 10/06/19 1431)     Initial Impression / Assessment and Plan / ED Course  I have reviewed the triage vital signs and the nursing notes.  Pertinent labs & imaging results that were available during my care of  the patient were reviewed by me and considered in my medical decision making (see chart for details).  Clinical Course as of Oct 05 1604  Sat Oct 06, 2019  1559 2 more attempts were made to call patient's family.  The  [phone] number still goes to a voicemail.  Additionally, I tried calling [phone] which is listed in the EMS sheet, and nobody is picking up that call either.  We have advised the triage team to be on the look out for family.  We will proceed with admission at this time.    [AN]  1559 She is in the middle of getting 30 cc/kg fluid. Fluid reassessment in sepsis completed now.  Her blood pressure has risen to 95/50s.  RN informed me that she just received a urine sample.  BP(!): 86/51 [AN]  1600 CBC is elevated with bandemia, raising the suspicion for infection.  WBC(!): 14.9 [AN]  1600 She has tachypnea, could be because of DKA.  We will get COVID-19 test as well.  Resp(!): 26 [AN]  1601 Patient's bicarb is 9 and gap is 20.  She is on insulin drip.  She has DKA.  CO2(!): 9 [AN]  1601 It is unclear whether patient is taking Eliquis and if so when her last dose was.  We will not proceed with LP until we have better information  APTT(!): 38 [AN]  1601 Patient reassessed.  She appears more awake but still not providing meaningful history.   [AN]    Clinical Course User Index [AN] Varney Biles, MD       DDx includes: ICH / Stroke ACS Sepsis syndrome Infection Encephalopathy  Electrolyte abnormality Drug overdose DKA Metabolic disorders including thyroid disorders, adrenal insufficiency Cancer of unknown origin / paraneoplastic process Hypercapnia / COPD Hypoxia   73 year old comes in a chief complaint of altered mental status.  She is on DOAC because of paroxysmal A. Fib.  Patient has new altered mental status.  Unknown last normal time.  She has been sick for 3 days, but we do not know nature of the illness nor do we know if the altered mental status was  sudden.  Broad differential diagnosis as above considered with corresponding workup initiated.   Reassessment: Labs reviewed patient has bandemia along with DKA.  Gap is at 20 with bicarb less than 9.  Mental status is unchanged.  It is possible that the altered mental status could be a result of DKA.  There is still a possibility of stroke, especially given her history of A. Fib, however it is unclear when she was last normal.  CT brain does not show any bleed or evidence of stroke which is reassuring.  Additionally, she has a fever, which also makes [place] believe that likely the underlying process is infectious and not a stroke.  Unfortunately, patient is on Eliquis.  We do not know when she last took that medication.  We will not be able to complete a lumbar puncture here.  Multiple attempts have been made to speak with family but we have not had a response yet.  She has received 2 g of ceftriaxone, we will add vancomycin and acyclovir if we do not get more information by the time we are ready for admission.  Reassessment: Subsequently I was able to get in touch with patient's granddaughter.  She told me that she lives out of town and is on the way.  She will contact her father to come to the bedside.  We contacted registration and ultimately patient's son came to the bedside.  He pretty much gives history consistent with what EMS had provided, adding that patient became altered today.  He denies any sick contacts.  Patient was noted to be in A. fib with RVR.  I discussed this with Dr. Natasha Mead who had already admitted the patient.  He was considering diltiazem drip.  RN then informed me that the heart rate has come down when patient calmed down.  I ordered oral diltiazem and advised to start IV only if her heart rate is sustained at a rate over 120.   Naida H Castello was evaluated in Emergency Department on 10/06/2019 for the symptoms described in the history of present illness. She was evaluated in the  context of the global COVID-19 pandemic, which necessitated consideration that the patient might be at risk for infection with the SARS-CoV-2 virus that causes COVID-19. Institutional protocols and algorithms that pertain to the evaluation of patients at risk for COVID-19 are in a state of rapid change based on information released by regulatory bodies including the CDC and federal and state organizations. These policies and algorithms were followed during the patient's care in the ED.   Final Clinical Impressions(s) / ED Diagnoses   Final diagnoses:  Disorientation  Diabetic ketoacidosis with coma associated with type 2 diabetes mellitus ([place])  [facility])    ED Discharge Orders    None       Varney Biles, MD 10/06/19 1606    Varney Biles, MD 10/06/19 1929

## 2019-10-06 NOTE — ED Notes (Signed)
Pt noted to be in afib rate 125-148. EDP made aware.

## 2019-10-07 ENCOUNTER — Inpatient Hospital Stay (HOSPITAL_COMMUNITY): Payer: Medicare HMO

## 2019-10-07 ENCOUNTER — Inpatient Hospital Stay: Payer: Self-pay

## 2019-10-07 LAB — GLUCOSE, CAPILLARY
Glucose-Capillary: 153 mg/dL — ABNORMAL HIGH (ref 70–99)
Glucose-Capillary: 160 mg/dL — ABNORMAL HIGH (ref 70–99)
Glucose-Capillary: 113 mg/dL — ABNORMAL HIGH (ref 70–99)
Glucose-Capillary: 95 mg/dL (ref 70–99)
Glucose-Capillary: 90 mg/dL (ref 70–99)
Glucose-Capillary: 299 mg/dL — ABNORMAL HIGH (ref 70–99)
Glucose-Capillary: 283 mg/dL — ABNORMAL HIGH (ref 70–99)
Glucose-Capillary: 273 mg/dL — ABNORMAL HIGH (ref 70–99)

## 2019-10-07 LAB — BLOOD CULTURE ID PANEL (REFLEXED)

## 2019-10-07 LAB — BASIC METABOLIC PANEL
Anion gap: 12 (ref 5–15)
BUN: 12 mg/dL (ref 8–23)
CO2: 15 mmol/L — ABNORMAL LOW (ref 22–32)
Calcium: 8 mg/dL — ABNORMAL LOW (ref 8.9–10.3)
Chloride: 110 mmol/L (ref 98–111)
Creatinine, Ser: 0.73 mg/dL (ref 0.44–1.00)
GFR calc Af Amer: >60 mL/min (ref >60)
GFR calc non Af Amer: >60 mL/min (ref >60)
Glucose, Bld: 95 mg/dL (ref 70–99)
Potassium: 2.1 mmol/L — CL (ref 3.5–5.1)
Sodium: 137 mmol/L (ref 135–145)

## 2019-10-07 LAB — CBC
HCT: 30.4 % — ABNORMAL LOW (ref 36.0–46.0)
Hemoglobin: 10.5 g/dL — ABNORMAL LOW (ref 12.0–15.0)
MCH: 30.9 pg (ref 26.0–34.0)
MCHC: 34.5 g/dL (ref 30.0–36.0)
MCV: 89.4 fL (ref 80.0–100.0)
Platelets: 55 10*3/uL — ABNORMAL LOW (ref 150–400)
RBC: 3.4 MIL/uL — ABNORMAL LOW (ref 3.87–5.11)
RDW: 12.2 % (ref 11.5–15.5)
WBC: 7.3 10*3/uL (ref 4.0–10.5)
nRBC: 0 % (ref 0.0–0.2)

## 2019-10-07 LAB — HEPATIC FUNCTION PANEL
ALT: 59 U/L — ABNORMAL HIGH (ref 0–44)
AST: 56 U/L — ABNORMAL HIGH (ref 15–41)
Albumin: 2 g/dL — ABNORMAL LOW (ref 3.5–5.0)
Alkaline Phosphatase: 99 U/L (ref 38–126)
Bilirubin, Direct: <0.1 mg/dL (ref 0.0–0.2)
Total Bilirubin: 0.5 mg/dL (ref 0.3–1.2)
Total Protein: 5.4 g/dL — ABNORMAL LOW (ref 6.5–8.1)

## 2019-10-07 LAB — CBG MONITORING, ED: Glucose-Capillary: 157 mg/dL — ABNORMAL HIGH (ref 70–99)

## 2019-10-07 LAB — LACTIC ACID, PLASMA
Lactic Acid, Venous: 2.3 mmol/L (ref 0.5–1.9)
Lactic Acid, Venous: 1.5 mmol/L (ref 0.5–1.9)

## 2019-10-07 LAB — HEMOGLOBIN A1C
Hgb A1c MFr Bld: 16.4 % — ABNORMAL HIGH (ref 4.8–5.6)
Mean Plasma Glucose: 423.98 mg/dL

## 2019-10-07 LAB — MAGNESIUM: Magnesium: 1.6 mg/dL — ABNORMAL LOW (ref 1.7–2.4)

## 2019-10-07 MED ORDER — POTASSIUM CHLORIDE 10 MEQ/100ML IV SOLN
10.0000 meq | INTRAVENOUS | Status: AC
  Administered 2019-10-07 (×5): 10 meq via INTRAVENOUS
  Filled 2019-10-07 (×5): qty 100

## 2019-10-07 MED ORDER — INSULIN ASPART 100 UNIT/ML ~~LOC~~ SOLN
0.0000 [IU] | Freq: Three times a day (TID) | SUBCUTANEOUS | Status: DC
  Administered 2019-10-07 (×2): 8 [IU] via SUBCUTANEOUS
  Administered 2019-10-08: 3 [IU] via SUBCUTANEOUS

## 2019-10-07 MED ORDER — INSULIN GLARGINE 100 UNIT/ML ~~LOC~~ SOLN
10.0000 [IU] | Freq: Every day | SUBCUTANEOUS | Status: DC
  Administered 2019-10-07 – 2019-10-14 (×6): 10 [IU] via SUBCUTANEOUS
  Filled 2019-10-07 (×8): qty 0.1

## 2019-10-07 MED ORDER — ACETAMINOPHEN 325 MG PO TABS
650.0000 mg | ORAL_TABLET | Freq: Four times a day (QID) | ORAL | Status: DC | PRN
  Administered 2019-10-07 – 2019-10-12 (×5): 650 mg via ORAL
  Filled 2019-10-07 (×5): qty 2

## 2019-10-07 MED ORDER — IOHEXOL 300 MG/ML  SOLN
100.0000 mL | Freq: Once | INTRAMUSCULAR | Status: AC | PRN
  Administered 2019-10-07: 100 mL via INTRAVENOUS

## 2019-10-07 MED ORDER — MAGNESIUM SULFATE 2 GM/50ML IV SOLN
2.0000 g | Freq: Once | INTRAVENOUS | Status: AC
  Administered 2019-10-07: 2 g via INTRAVENOUS
  Filled 2019-10-07: qty 50

## 2019-10-07 MED ORDER — VANCOMYCIN HCL IN DEXTROSE 750-5 MG/150ML-% IV SOLN
750.0000 mg | INTRAVENOUS | Status: DC
  Administered 2019-10-07: 750 mg via INTRAVENOUS
  Filled 2019-10-07: qty 150

## 2019-10-07 MED ORDER — TRAMADOL HCL 50 MG PO TABS
50.0000 mg | ORAL_TABLET | Freq: Once | ORAL | Status: AC
  Administered 2019-10-07: 50 mg via ORAL
  Filled 2019-10-07: qty 1

## 2019-10-07 MED ORDER — ACETAMINOPHEN 325 MG PO TABS: 650.0000 mg | ORAL_TABLET | Freq: Once | ORAL | Status: DC

## 2019-10-07 MED ORDER — POTASSIUM CHLORIDE 10 MEQ/100ML IV SOLN
10.0000 meq | INTRAVENOUS | Status: AC
  Administered 2019-10-07: 10 meq via INTRAVENOUS
  Filled 2019-10-07: qty 100

## 2019-10-07 MED ORDER — ACETAMINOPHEN 650 MG RE SUPP
650.0000 mg | RECTAL | Status: DC | PRN
  Administered 2019-10-07: 650 mg via RECTAL
  Filled 2019-10-07: qty 1

## 2019-10-07 MED ORDER — SODIUM CHLORIDE 0.9 % IV SOLN
2.0000 g | Freq: Four times a day (QID) | INTRAVENOUS | Status: DC
  Administered 2019-10-07 – 2019-10-08 (×3): 2 g via INTRAVENOUS
  Filled 2019-10-07 (×2): qty 2
  Filled 2019-10-07 (×3): qty 2000
  Filled 2019-10-07: qty 2

## 2019-10-07 MED ORDER — LACTATED RINGERS IV BOLUS
1000.0000 mL | Freq: Once | INTRAVENOUS | Status: AC
  Administered 2019-10-07: 1000 mL via INTRAVENOUS

## 2019-10-07 NOTE — Progress Notes (Addendum)
Patient arrived to unit.  Son at bedside to translate.  Patient restless and complains of generalized pain, RN notified TRH.  No PRNs ordered. Patient appears to only be alert to self at this time but difficult to tell due to language barrier.  Pt will state name but nothing further.

## 2019-10-07 NOTE — Progress Notes (Signed)
RN confirmed with IP to keep patient on droplet precautions until rule out for meningitis.  Droplet sign placed on patient door.

## 2019-10-07 NOTE — Progress Notes (Signed)
Critical potassium of 2.1.  Blount, NP notified.    Continuous attempts to reach phlebotomy about stat lactic.  Unable to reach.. Lab stated they were very busy to keep calling.

## 2019-10-07 NOTE — Progress Notes (Addendum)
   10/07/19 0511  Vitals  Temp (!) 100.9 F (38.3 C)  Temp Source Oral  BP (!) 84/52  MAP (mmHg) (!) 62  BP Location Right Arm  BP Method Automatic  Patient Position (if appropriate) Lying  Pulse Rate 85  Resp (!) 30  Oxygen Therapy  SpO2 93 %  O2 Device Room Air  MEWS Score  MEWS RR 2  MEWS Pulse 0  MEWS Systolic 1  MEWS LOC 0  MEWS Temp 1  MEWS Score 4  MEWS Score Color Red  Blount, NP notified and Courtney with rapid   RR nurse at bedside.  Orders received from NP for 1058ml bolus LR and rectal tylenol.  Frequent VS cycling every 15 minutes.

## 2019-10-07 NOTE — Progress Notes (Signed)
Assessed pt with Mews of 4, BP 84/52 HR 85, RR 28, T 100.9, SpO2 92 RA. Pt arouses to to stimuli and alert to self but quickly falls back asleep. TRH paged and ordered 1L LR bolus, and rectal tylenol. Pt BP 99/56 after bolus started. TRH to bedside pts level of care clarified as progressive. Please call rapid if any further issues with BP after bolus.

## 2019-10-07 NOTE — Progress Notes (Addendum)
Patient's RR has picked up- RR of 32, restless in bed, not as willing to respond to questions.  Pulse ox of 96% RA, BP 99/75, HR of 62 NSR  Paged MD - MD ordered Chest x-ray and to stop NS fluids

## 2019-10-07 NOTE — Progress Notes (Signed)
Requested SCDs from portable .

## 2019-10-07 NOTE — Progress Notes (Signed)
Paged with concerns for increased MEWS score of 4. On assessment, pt is alert and does not appear to be in immediate distress. Pt slightly tachypneic and one episode of tachycardia noted while at bedside. VS otherwise stable.. Given tylenol and fluid bolus for mild fever. CBC, CMP and Lactic Acid ordered. Changed pt's level of care to progressive for now.   Henrine Screws, NP [facility] 7p-7a [phone]

## 2019-10-07 NOTE — Progress Notes (Addendum)
   10/07/19 0557  Vitals  Pulse Rate (!) 144  ECG Heart Rate (!) 141  Resp (!) 30  Oxygen Therapy  SpO2 93 %  MEWS Score  MEWS RR 2  MEWS Pulse 3  MEWS Systolic 1  MEWS LOC 0  MEWS Temp 1  MEWS Score 7  MEWS Score Color Red   NP and rapid response nurse at bedside.  According to provider she did not feel too concerned at this point.  Bolus in process and tylenol given.  Pt son still at bedside.

## 2019-10-07 NOTE — Progress Notes (Signed)
PHARMACY - PHYSICIAN COMMUNICATION CRITICAL VALUE ALERT - BLOOD CULTURE IDENTIFICATION (BCID)  Courtney [name] is an 73 y.o. female who presented to [facility] on 10/06/2019 with a chief complaint of AMS/sespsis, possible meningitis  Assessment:  2/2 blood cultures growing Klebsiella pneumoniae  Name of physician (or Provider) Contacted:  X Blounnt  Current antibiotics: Vancomycin Rocephin Ampicillin  Changes to prescribed antibiotics recommended:   Continue Rocephin 2 g IV q12h.  D/C Vancomycin and Ampicillin.  If meningitis ruled out, would change Rocephin 2 g IV q24h  No results found for this or any previous visit.  Caryl Pina 10/07/2019  4:20 AM

## 2019-10-07 NOTE — Progress Notes (Signed)
Patient is septic- monitoring vitals closely.    MD is aware

## 2019-10-07 NOTE — Progress Notes (Signed)
Pharmacy Antibiotic Note  Courtney [name] is a 73 y.o. female admitted on 10/06/2019 with Sepsis/Meningits.  Pharmacy has been consulted for Vancomycin and ampicllin dosing.  Antibiotics had been narrowed to ceftriaxone after BCID this AM, now asked to add back ampicillin and vancomycin.  Plan: - Treating for possible meningitis will target a goal trough of 15-20  - Resume Vancomycin 750 mg IV q24h - next dose due 5 PM - Ampicillin 2g IV q 6 hrs. -F/u renal function, cultures and clinical course.  Thank you for allowing pharmacy to be a part of this patient's care.  Pat Patrick 10/07/2019 12:48 PM  Height: 5\' 1"  (154.9 cm) Weight: 99 lb 10.4 oz (45.2 kg) IBW/kg (Calculated) : 47.8  Temp (24hrs), Avg:99.4 F (37.4 C), Min:98.2 F (36.8 C), Max:100.9 F (38.3 C)  Recent Labs  Lab 10/06/19 1240 10/06/19 1633 10/06/19 2014 10/07/19 0429 10/07/19 0653 10/07/19 1039  WBC 14.9*  --   --  7.3  --   --   CREATININE 1.13* 1.05* 0.89 0.73  --   --   LATICACIDVEN 1.7 2.4*  --   --  2.3* 1.5    Estimated Creatinine Clearance: 45.4 mL/min (by C-G formula based on SCr of 0.73 mg/dL).    No Known Allergies  Antimicrobials this admission:  Acyclovir x 1 dose Ceftriaxone 10/24 >  Vancomycin 10/24 > Ampicillin 10/24 >  Dose adjustments this admission:   Microbiology results:  10/24 - BCx x 2 > GNR   **BCID - Klebsiella pneumoniae 10/24 COVID neg 10/24 Ucx > sent  Marguerite Olea, [facility] Clinical Pharmacist Phone [phone]  10/07/2019 12:51 PM

## 2019-10-07 NOTE — Progress Notes (Signed)
PROGRESS NOTE    Courtney [name]  NWG:[id number] DOB: 1946/01/06 DOA: 10/06/2019 PCP: Patient, No Pcp Per     Brief Narrative:  Courtney [name] is a [place] speaking 73 year old female with past medical history significant for type 2 diabetes, atrial fibrillation, tachybradycardia syndrome who was brought into the emergency department with chief complaint of altered mental status.  In the emergency department, there was concern for meningitis and patient was started on vancomycin, ampicillin, ceftriaxone.  LP not pursued in the emergency department as patient was on Eliquis and was unclear when her last dose was.  She was also found in DKA and was started on IV insulin.  New events last 24 hours / Subjective: History gathered from son who is able to translate at bedside.  He states that about 2 days ago, patient was complaining of feeling hot and cold.  Also complains of headaches, no neck pain, no complaints of cough, shortness of breath.  She also did complain of some left-sided chest pain that had been intermittent.  No nausea, vomiting or abdominal pain.  She had been tolerating diet without any issues.  Last Eliquis dose was 2 days ago.  Her mentation is returned back to her baseline per son.  Assessment & Plan:   Active Problems:   Altered mental status   AMS (altered mental status)   Acute metabolic encephalopathy -Multifactorial in setting of DKA as well as underlying sepsis -Returned back to baseline per son at bedside  Severe sepsis secondary to Klebsiella bacteremia versus additional sources, present on admission -At the time of admission there was concern for meningitis due to patient's fever, altered mental status, headache.  LP was not pursued at admission due to patient being on Eliquis.  Son confirms that patient's last Eliquis dose was 2 days ago. -Blood culture returned with Klebsiella -Ordered for fluoroscopy lumbar puncture to rule out meningitis -CT abdomen pelvis to find source  of Klebsiella bacteremia -Currently on Rocephin, vancomycin, ampicillin  Paroxysmal atrial fibrillation with RVR -Patient was started on Cardizem drip at the time of admission -Hold Eliquis for now until LP can be completed -Currently in normal sinus rhythm with rate control, stop Cardizem drip  Tachybradycardia syndrome -Continue flecainide, metoprolol  DKA -Hemoglobin A1c 16.4 -Patient required IV insulin during hospitalization and has now been transitioned to Lantus and sliding scale insulin  Hypokalemia -Replace, trend  Hypomagnesemia -Replace, trend   DVT prophylaxis: SCD Code Status: Full code Family Communication: Son at bedside Disposition Plan: Pending further work-up   Consultants:   None  Procedures:   None  Antimicrobials:  Anti-infectives (From admission, onward)   Start     Dose/Rate Route Frequency Ordered Stop   10/07/19 1700  vancomycin (VANCOCIN) IVPB 750 mg/150 ml premix  Status:  Discontinued     750 mg 150 mL/hr over 60 Minutes Intravenous Every 24 hours 10/06/19 1608 10/07/19 0427   10/07/19 0200  cefTRIAXone (ROCEPHIN) 2 g in sodium chloride 0.9 % 100 mL IVPB     2 g 200 mL/hr over 30 Minutes Intravenous Every 12 hours 10/06/19 1610     10/06/19 1615  vancomycin (VANCOCIN) 1,250 mg in sodium chloride 0.9 % 250 mL IVPB     1,250 mg 166.7 mL/hr over 90 Minutes Intravenous  Once 10/06/19 1608 10/06/19 2111   10/06/19 1615  ampicillin (OMNIPEN) 2 g in sodium chloride 0.9 % 100 mL IVPB  Status:  Discontinued     2 g 300 mL/hr over 20 Minutes  Intravenous Every 4 hours 10/06/19 1613 10/07/19 0427   10/06/19 1600  acyclovir (ZOVIRAX) 550 mg in dextrose 5 % 100 mL IVPB     550 mg 111 mL/hr over 60 Minutes Intravenous  Once 10/06/19 1558 10/06/19 2216   10/06/19 1230  cefTRIAXone (ROCEPHIN) 2 g in sodium chloride 0.9 % 100 mL IVPB     2 g 200 mL/hr over 30 Minutes Intravenous  Once 10/06/19 1215 10/06/19 1431        Objective: Vitals:    10/07/19 0815 10/07/19 0840 10/07/19 0910 10/07/19 0923  BP: 92/62 (!) 85/50 (!) 95/54 (!) 92/59  Pulse: 91 85 84 83  Resp: (!) 33 (!) 29 (!) 29 (!) 28  Temp:      TempSrc:      SpO2: 94% 93% 93% 94%  Weight:      Height:        Intake/Output Summary (Last 24 hours) at 10/07/2019 1209 Last data filed at 10/07/2019 0841 Gross per 24 hour  Intake 2695.44 ml  Output -  Net 2695.44 ml   Filed Weights   10/06/19 1503 10/07/19 0142  Weight: 54.4 kg 45.2 kg    Examination:  General exam: Appears calm and comfortable  Respiratory system: Clear to auscultation. Respiratory effort normal. No respiratory distress. No conversational dyspnea.  Cardiovascular system: S1 & S2 heard, RRR. No murmurs. No pedal edema. Gastrointestinal system: Abdomen is nondistended, soft and nontender. Normal bowel sounds heard. Central nervous system: Alert and oriented. No focal neurological deficits.  Extremities: Symmetric in appearance  Skin: No rashes, lesions or ulcers on exposed skin  Psychiatry: Stable   Data Reviewed: I have personally reviewed following labs and imaging studies  CBC: Recent Labs  Lab 10/06/19 1240 10/07/19 0429  WBC 14.9* 7.3  NEUTROABS 12.1*  --   HGB 11.9* 10.5*  HCT 37.1 30.4*  MCV 97.6 89.4  PLT 101* 55*   Basic Metabolic Panel: Recent Labs  Lab 10/06/19 1240 10/06/19 1633 10/06/19 2014 10/07/19 0429  NA 133* 137 136 137  K 3.3* 3.3* 3.1* 2.1*  CL 104 111 108 110  CO2 9* 11* 14* 15*  GLUCOSE 369* 325* 186* 95  BUN CREATININE 1.13* 1.05* 0.89 0.73  CALCIUM 8.3* 7.7* 8.2* 8.0*  MG  --   --   --  1.6*   GFR: Estimated Creatinine Clearance: 45.4 mL/min (by C-G formula based on SCr of 0.73 mg/dL). Liver Function Tests: Recent Labs  Lab 10/06/19 1240 10/07/19 0429  AST 64* 56*  ALT 84* 59*  ALKPHOS 118 99  BILITOT 1.5* 0.5  PROT 6.7 5.4*  ALBUMIN 2.8* 2.0*   No results for input(s): LIPASE, AMYLASE in the last 168 hours. No results  for input(s): AMMONIA in the last 168 hours. Coagulation Profile: Recent Labs  Lab 10/06/19 1240  INR 1.2   Cardiac Enzymes: No results for input(s): CKTOTAL, CKMB, CKMBINDEX, TROPONINI in the last 168 hours. BNP (last 3 results) No results for input(s): PROBNP in the last 8760 hours. HbA1C: Recent Labs    10/07/19 0429  HGBA1C 16.4*   CBG: Recent Labs  Lab 10/07/19 0145 10/07/19 0259 10/07/19 0407 10/07/19 0517 10/07/19 0629  GLUCAP 153* 160* 113* 95 90   Lipid Profile: No results for input(s): CHOL, HDL, LDLCALC, TRIG, CHOLHDL, LDLDIRECT in the last 72 hours. Thyroid Function Tests: No results for input(s): TSH, T4TOTAL, FREET4, T3FREE, THYROIDAB in the last 72 hours. Anemia Panel: No results for input(s):  VITAMINB12, FOLATE, FERRITIN, TIBC, IRON, RETICCTPCT in the last 72 hours. Sepsis Labs: Recent Labs  Lab 10/06/19 1240 10/06/19 1633 10/07/19 0653 10/07/19 1039  LATICACIDVEN 1.7 2.4* 2.3* 1.5    Recent Results (from the past 240 hour(s))  Blood Culture (routine x 2)     Status: None (Preliminary result)   Collection Time: 10/06/19 12:40 PM   Specimen: BLOOD  Result Value Ref Range Status   Specimen Description BLOOD RIGHT ANTECUBITAL  Final   Special Requests   Final    BOTTLES DRAWN AEROBIC AND ANAEROBIC Blood Culture results may not be optimal due to an inadequate volume of blood received in culture bottles   Culture  Setup Time   Final    IN BOTH AEROBIC AND ANAEROBIC BOTTLES GRAM NEGATIVE RODS Organism ID to follow CRITICAL RESULT CALLED TO, READ BACK BY AND VERIFIED WITH: G ABBOTT PHARMD 10/07/19 0419 JDW    Culture   Final    CULTURE REINCUBATED FOR BETTER GROWTH Performed at [facility], [address] [place], [place] [address]    Report Status PENDING  Incomplete  Blood Culture ID Panel (Reflexed)     Status: Abnormal   Collection Time: 10/06/19 12:40 PM  Result Value Ref Range Status   Enterococcus species NOT DETECTED NOT  DETECTED Final   Listeria monocytogenes NOT DETECTED NOT DETECTED Final   Staphylococcus species NOT DETECTED NOT DETECTED Final   Staphylococcus aureus (BCID) NOT DETECTED NOT DETECTED Final   Streptococcus species NOT DETECTED NOT DETECTED Final   Streptococcus agalactiae NOT DETECTED NOT DETECTED Final   Streptococcus pneumoniae NOT DETECTED NOT DETECTED Final   Streptococcus pyogenes NOT DETECTED NOT DETECTED Final   Acinetobacter baumannii NOT DETECTED NOT DETECTED Final   Enterobacteriaceae species DETECTED (A) NOT DETECTED Final    Comment: Enterobacteriaceae represent a large family of gram-negative bacteria, not a single organism. CRITICAL RESULT CALLED TO, READ BACK BY AND VERIFIED WITH: G ABBOTT PHARMD 10/07/19 0419 JDW    Enterobacter cloacae complex NOT DETECTED NOT DETECTED Final   Escherichia coli NOT DETECTED NOT DETECTED Final   Klebsiella oxytoca NOT DETECTED NOT DETECTED Final   Klebsiella pneumoniae DETECTED (A) NOT DETECTED Final    Comment: CRITICAL RESULT CALLED TO, READ BACK BY AND VERIFIED WITH: G ABBOTT PHARMD 10/07/19 0419 JDW    Proteus species NOT DETECTED NOT DETECTED Final   Serratia marcescens NOT DETECTED NOT DETECTED Final   Carbapenem resistance NOT DETECTED NOT DETECTED Final   Haemophilus influenzae NOT DETECTED NOT DETECTED Final   Neisseria meningitidis NOT DETECTED NOT DETECTED Final   Pseudomonas aeruginosa NOT DETECTED NOT DETECTED Final   Candida albicans NOT DETECTED NOT DETECTED Final   Candida glabrata NOT DETECTED NOT DETECTED Final   Candida krusei NOT DETECTED NOT DETECTED Final   Candida parapsilosis NOT DETECTED NOT DETECTED Final   Candida tropicalis NOT DETECTED NOT DETECTED Final    Comment: Performed at [facility], [address] [place], [place] [address]  Blood Culture (routine x 2)     Status: None (Preliminary result)   Collection Time: 10/06/19 12:45 PM   Specimen: BLOOD RIGHT HAND  Result Value Ref Range Status    Specimen Description BLOOD RIGHT HAND  Final   Special Requests   Final    BOTTLES DRAWN AEROBIC AND ANAEROBIC Blood Culture results may not be optimal due to an inadequate volume of blood received in culture bottles   Culture  Setup Time   Final  IN BOTH AEROBIC AND ANAEROBIC BOTTLES GRAM NEGATIVE RODS CRITICAL VALUE NOTED.  VALUE IS CONSISTENT WITH PREVIOUSLY REPORTED AND CALLED VALUE.    Culture   Final    CULTURE REINCUBATED FOR BETTER GROWTH Performed at [facility], [place] [address] [place], [place] [address]    Report Status PENDING  Incomplete  SARS CORONAVIRUS 2 (TAT 6-24 HRS) Nasopharyngeal Nasopharyngeal Swab     Status: None   Collection Time: 10/06/19  3:35 PM   Specimen: Nasopharyngeal Swab  Result Value Ref Range Status   SARS Coronavirus 2 NEGATIVE NEGATIVE Final    Comment: (NOTE) SARS-CoV-2 target nucleic acids are NOT DETECTED. The SARS-CoV-2 RNA is generally detectable in upper and lower respiratory specimens during the acute phase of infection. Negative results do not preclude SARS-CoV-2 infection, do not rule out co-infections with other pathogens, and should not be used as the sole basis for treatment or other patient management decisions. Negative results must be combined with clinical observations, patient history, and epidemiological information. The expected result is Negative. Fact Sheet for Patients: SugarRoll.be Fact Sheet for Healthcare Providers: https://www.woods-mathews.com/ This test is not yet approved or cleared by the [place] FDA and  has been authorized for detection and/or diagnosis of SARS-CoV-2 by FDA under an Emergency Use Authorization (EUA). This EUA will remain  in effect (meaning this test can be used) for the duration of the COVID-19 declaration under Section 56 4(b)(1) of the Act, 21 U.S.C. section 360bbb-3(b)(1), unless the authorization is terminated or revoked sooner.  Performed at [facility], [place] [address], [place], [place] [address]       Radiology Studies: Ct Head Wo Contrast  Result Date: 10/06/2019 CLINICAL DATA:  Altered level of consciousness and fever EXAM: CT HEAD WITHOUT CONTRAST TECHNIQUE: Contiguous axial images were obtained from the base of the skull through the vertex without intravenous contrast. COMPARISON:  None. FINDINGS: Brain: The ventricles are normal in size and configuration. There is no intracranial mass, hemorrhage, extra-axial fluid collection, or midline shift. Brain parenchyma appears unremarkable. No acute infarct evident. Vascular: There is no hyperdense vessel. There is calcification in each carotid siphon region. Skull: The bony calvarium appears intact. Sinuses/Orbits: There is mucosal thickening in several ethmoid air cells. Other visualized paranasal sinuses are clear. Visualized orbits appear symmetric bilaterally. Other: Mastoid air cells are clear. IMPRESSION: Brain parenchyma appears unremarkable. No evident acute infarct. No mass or hemorrhage. There are foci of arterial vascular calcification. There is mucosal thickening in several ethmoid air cells. Electronically Signed   By: Lowella Grip III M.D.   On: 10/06/2019 14:55   Dg Chest Port 1 View  Result Date: 10/06/2019 CLINICAL DATA:  Altered mental status EXAM: PORTABLE CHEST 1 VIEW COMPARISON:  09/06/2017 FINDINGS: There is no focal consolidation. There is no pleural effusion or pneumothorax. The heart and mediastinal contours are unremarkable. 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 There is thoracic aortic atherosclerosis. There is no acute osseous abnormality. IMPRESSION: No active disease. Electronically Signed   By: Kathreen Devoid   On: 10/06/2019 12:49   [place] Ekg Site Rite  Result Date: 10/07/2019 If Site Rite image not attached, placement could not be confirmed due to current cardiac rhythm.     Scheduled Meds: . flecainide  50 mg Oral BID  .  insulin aspart  0-15 Units Subcutaneous TID WC  . insulin glargine  10 Units Subcutaneous Daily  . metoprolol tartrate  50 mg Oral BID   Continuous Infusions: .  sodium chloride Stopped (10/07/19 0124)  . cefTRIAXone (ROCEPHIN)  IV 2 g (10/07/19 0308)  . diltiazem (CARDIZEM) infusion Stopped (10/06/19 1906)  . magnesium sulfate bolus IVPB    . potassium chloride 10 mEq (10/07/19 1111)     LOS: 1 day      Time spent: 40 minutes   Noralee Stain, DO [facility] 10/07/2019, 12:09 PM   Available via Epic secure chat 7am-7pm After these hours, please refer to coverage provider listed on [email or web address]

## 2019-10-07 NOTE — Progress Notes (Signed)
Patient within target range on insulin drip for four consecutive hours.  RN notified provider.

## 2019-10-07 NOTE — Progress Notes (Signed)
Secure Chat with Dr Maylene [name].  Will hold on PICC due to positive blood cultures, fever, low BP.  Pt currently has 4 PIV's - adequate access, no drips at this time, only IVF and potassium runs.

## 2019-10-08 ENCOUNTER — Inpatient Hospital Stay (HOSPITAL_COMMUNITY): Payer: Medicare HMO

## 2019-10-08 ENCOUNTER — Other Ambulatory Visit: Payer: Self-pay

## 2019-10-08 DIAGNOSIS — R8271 Bacteriuria: Secondary | ICD-10-CM

## 2019-10-08 DIAGNOSIS — R4182 Altered mental status, unspecified: Secondary | ICD-10-CM | POA: Diagnosis not present

## 2019-10-08 DIAGNOSIS — K769 Liver disease, unspecified: Secondary | ICD-10-CM | POA: Diagnosis not present

## 2019-10-08 DIAGNOSIS — R932 Abnormal findings on diagnostic imaging of liver and biliary tract: Secondary | ICD-10-CM

## 2019-10-08 DIAGNOSIS — R935 Abnormal findings on diagnostic imaging of other abdominal regions, including retroperitoneum: Secondary | ICD-10-CM

## 2019-10-08 DIAGNOSIS — R7881 Bacteremia: Secondary | ICD-10-CM | POA: Diagnosis not present

## 2019-10-08 DIAGNOSIS — R945 Abnormal results of liver function studies: Secondary | ICD-10-CM

## 2019-10-08 DIAGNOSIS — R0902 Hypoxemia: Secondary | ICD-10-CM

## 2019-10-08 LAB — BASIC METABOLIC PANEL
Anion gap: 11 (ref 5–15)
Anion gap: 12 (ref 5–15)
BUN: 24 mg/dL — ABNORMAL HIGH (ref 8–23)
BUN: 23 mg/dL (ref 8–23)
CO2: 14 mmol/L — ABNORMAL LOW (ref 22–32)
CO2: 12 mmol/L — ABNORMAL LOW (ref 22–32)
Calcium: 7.8 mg/dL — ABNORMAL LOW (ref 8.9–10.3)
Calcium: 7.9 mg/dL — ABNORMAL LOW (ref 8.9–10.3)
Chloride: 109 mmol/L (ref 98–111)
Chloride: 111 mmol/L (ref 98–111)
Creatinine, Ser: 0.76 mg/dL (ref 0.44–1.00)
Creatinine, Ser: 0.83 mg/dL (ref 0.44–1.00)
GFR calc Af Amer: >60 mL/min (ref >60)
GFR calc Af Amer: >60 mL/min (ref >60)
GFR calc non Af Amer: >60 mL/min (ref >60)
GFR calc non Af Amer: >60 mL/min (ref >60)
Glucose, Bld: 162 mg/dL — ABNORMAL HIGH (ref 70–99)
Glucose, Bld: 205 mg/dL — ABNORMAL HIGH (ref 70–99)
Potassium: 3.4 mmol/L — ABNORMAL LOW (ref 3.5–5.1)
Potassium: 3.7 mmol/L (ref 3.5–5.1)
Sodium: 134 mmol/L — ABNORMAL LOW (ref 135–145)
Sodium: 135 mmol/L (ref 135–145)

## 2019-10-08 LAB — GLUCOSE, CAPILLARY
Glucose-Capillary: 108 mg/dL — ABNORMAL HIGH (ref 70–99)
Glucose-Capillary: 126 mg/dL — ABNORMAL HIGH (ref 70–99)
Glucose-Capillary: 161 mg/dL — ABNORMAL HIGH (ref 70–99)
Glucose-Capillary: 173 mg/dL — ABNORMAL HIGH (ref 70–99)
Glucose-Capillary: 200 mg/dL — ABNORMAL HIGH (ref 70–99)
Glucose-Capillary: 207 mg/dL — ABNORMAL HIGH (ref 70–99)
Glucose-Capillary: 232 mg/dL — ABNORMAL HIGH (ref 70–99)

## 2019-10-08 LAB — CBC
HCT: 29.8 % — ABNORMAL LOW (ref 36.0–46.0)
Hemoglobin: 10.6 g/dL — ABNORMAL LOW (ref 12.0–15.0)
MCH: 31.2 pg (ref 26.0–34.0)
MCHC: 35.6 g/dL (ref 30.0–36.0)
MCV: 87.6 fL (ref 80.0–100.0)
Platelets: 43 10*3/uL — ABNORMAL LOW (ref 150–400)
RBC: 3.4 MIL/uL — ABNORMAL LOW (ref 3.87–5.11)
RDW: 12.6 % (ref 11.5–15.5)
WBC: 10.4 10*3/uL (ref 4.0–10.5)
nRBC: 0.2 % (ref 0.0–0.2)

## 2019-10-08 LAB — URINE CULTURE: Culture: 30000 — AB

## 2019-10-08 LAB — HEPATIC FUNCTION PANEL
ALT: 885 U/L — ABNORMAL HIGH (ref 0–44)
AST: 2353 U/L — ABNORMAL HIGH (ref 15–41)
Albumin: 2 g/dL — ABNORMAL LOW (ref 3.5–5.0)
Alkaline Phosphatase: 98 U/L (ref 38–126)
Bilirubin, Direct: 0.2 mg/dL (ref 0.0–0.2)
Indirect Bilirubin: 0.7 mg/dL (ref 0.3–0.9)
Total Bilirubin: 0.9 mg/dL (ref 0.3–1.2)
Total Protein: 5.4 g/dL — ABNORMAL LOW (ref 6.5–8.1)

## 2019-10-08 LAB — MAGNESIUM
Magnesium: 2.1 mg/dL (ref 1.7–2.4)
Magnesium: 1.8 mg/dL (ref 1.7–2.4)

## 2019-10-08 LAB — BLOOD GAS, ARTERIAL
Acid-base deficit: 11 mmol/L — ABNORMAL HIGH (ref 0.0–2.0)
Bicarbonate: 12.3 mmol/L — ABNORMAL LOW (ref 20.0–28.0)
FIO2: 100
O2 Saturation: 97.3 %
Patient temperature: 98.6
pH, Arterial: 7.453 — ABNORMAL HIGH (ref 7.350–7.450)
pO2, Arterial: 102 mmHg (ref 83.0–108.0)

## 2019-10-08 LAB — HEPATITIS PANEL, ACUTE
HCV Ab: NONREACTIVE
Hep A IgM: NONREACTIVE
Hep B C IgM: NONREACTIVE
Hepatitis B Surface Ag: NONREACTIVE

## 2019-10-08 LAB — LACTIC ACID, PLASMA
Lactic Acid, Venous: 4.3 mmol/L (ref 0.5–1.9)
Lactic Acid, Venous: 2.7 mmol/L (ref 0.5–1.9)

## 2019-10-08 LAB — TSH: TSH: 0.894 u[IU]/mL (ref 0.350–4.500)

## 2019-10-08 LAB — PROTIME-INR
INR: 1.3 — ABNORMAL HIGH (ref 0.8–1.2)
Prothrombin Time: 16 seconds — ABNORMAL HIGH (ref 11.4–15.2)

## 2019-10-08 LAB — BETA-HYDROXYBUTYRIC ACID: Beta-Hydroxybutyric Acid: 1.05 mmol/L — ABNORMAL HIGH (ref 0.05–0.27)

## 2019-10-08 MED ORDER — RACEPINEPHRINE HCL 2.25 % IN NEBU
0.5000 mL | INHALATION_SOLUTION | Freq: Once | RESPIRATORY_TRACT | Status: AC
  Administered 2019-10-08: 0.5 mL via RESPIRATORY_TRACT
  Filled 2019-10-08: qty 0.5

## 2019-10-08 MED ORDER — INSULIN ASPART 100 UNIT/ML ~~LOC~~ SOLN
0.0000 [IU] | SUBCUTANEOUS | Status: DC
  Administered 2019-10-08: 4 [IU] via SUBCUTANEOUS
  Administered 2019-10-08: 7 [IU] via SUBCUTANEOUS
  Administered 2019-10-09: 4 [IU] via SUBCUTANEOUS
  Administered 2019-10-09 – 2019-10-10 (×3): 3 [IU] via SUBCUTANEOUS
  Administered 2019-10-10: 4 [IU] via SUBCUTANEOUS
  Administered 2019-10-11: 7 [IU] via SUBCUTANEOUS

## 2019-10-08 MED ORDER — INSULIN ASPART 100 UNIT/ML ~~LOC~~ SOLN
0.0000 [IU] | SUBCUTANEOUS | Status: DC
  Administered 2019-10-08: 2 [IU] via SUBCUTANEOUS

## 2019-10-08 MED ORDER — DIPHENHYDRAMINE HCL 50 MG/ML IJ SOLN: 25.0000 mg | Freq: Once | INTRAMUSCULAR | Status: DC

## 2019-10-08 MED ORDER — SODIUM CHLORIDE 0.9 % IV SOLN
1.0000 g | Freq: Two times a day (BID) | INTRAVENOUS | Status: DC
  Administered 2019-10-08 – 2019-10-11 (×7): 1 g via INTRAVENOUS
  Filled 2019-10-08 (×8): qty 1

## 2019-10-08 MED ORDER — TRAMADOL HCL 50 MG PO TABS
25.0000 mg | ORAL_TABLET | Freq: Once | ORAL | Status: AC
  Administered 2019-10-08: 25 mg via ORAL
  Filled 2019-10-08: qty 1

## 2019-10-08 MED ORDER — POTASSIUM CHLORIDE CRYS ER 20 MEQ PO TBCR
40.0000 meq | EXTENDED_RELEASE_TABLET | Freq: Once | ORAL | Status: AC
  Administered 2019-10-08: 40 meq via ORAL
  Filled 2019-10-08: qty 2

## 2019-10-08 MED ORDER — METHYLPREDNISOLONE SODIUM SUCC 125 MG IJ SOLR
80.0000 mg | Freq: Once | INTRAMUSCULAR | Status: AC
  Administered 2019-10-08: 15:00:00 80 mg via INTRAVENOUS

## 2019-10-08 MED ORDER — APIXABAN 5 MG PO TABS: 5.0000 mg | ORAL_TABLET | Freq: Two times a day (BID) | ORAL | Status: DC

## 2019-10-08 MED ORDER — LACTATED RINGERS IV BOLUS
500.0000 mL | Freq: Once | INTRAVENOUS | Status: AC
  Administered 2019-10-08: 500 mL via INTRAVENOUS

## 2019-10-08 MED ORDER — PANTOPRAZOLE SODIUM 40 MG IV SOLR
40.0000 mg | INTRAVENOUS | Status: DC
  Administered 2019-10-08 – 2019-10-13 (×5): 40 mg via INTRAVENOUS
  Filled 2019-10-08 (×6): qty 40

## 2019-10-08 MED ORDER — CHLORHEXIDINE GLUCONATE CLOTH 2 % EX PADS
6.0000 | MEDICATED_PAD | Freq: Every day | CUTANEOUS | Status: DC
  Administered 2019-10-08 – 2019-10-17 (×10): 6 via TOPICAL

## 2019-10-08 MED ORDER — METHYLPREDNISOLONE SODIUM SUCC 125 MG IJ SOLR
INTRAMUSCULAR | Status: AC
  Administered 2019-10-08: 80 mg via INTRAVENOUS
  Filled 2019-10-08: qty 2

## 2019-10-08 MED ORDER — SODIUM CHLORIDE 0.9 % IV SOLN
INTRAVENOUS | Status: DC | PRN
  Administered 2019-10-08 (×2): 250 mL via INTRAVENOUS

## 2019-10-08 MED ORDER — LACTATED RINGERS IV SOLN
INTRAVENOUS | Status: DC
  Administered 2019-10-08 – 2019-10-12 (×8): via INTRAVENOUS

## 2019-10-08 MED ORDER — FAMOTIDINE IN NACL 20-0.9 MG/50ML-% IV SOLN: 20.0000 mg | Freq: Once | INTRAVENOUS | Status: DC

## 2019-10-08 NOTE — Consult Note (Addendum)
[facility] Consult: 11:23 AM 10/08/2019  LOS: 2 days    Referring Provider: Dr Maylene [name]  Primary Care Physician:  Patient, No Pcp Per Primary Gastroenterologist:  Althia Forts.    Son: Minh Greaves.  [phone]    Reason for Consultation: Cholangitis, liver abscesses.   HPI: Courtney [name] is a 73 y.o. female.  [place], does not speak English hx diet-controlled diabetes.  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   Normal abdominal ultrasound evaluation of RUQ pain in 2017. No previous colonoscopic or endoscopic evaluation  presented to ED 2 days ago with altered mental status, fever, DKA (glucose 423) T bili 1.5 >> 0.9. Alk phos 118 >> 98 AST/ALT 64/84 >> 2353/885 PT/INR 15.3/1.2 yesterday Potassium 2.1>> 3.4.  BUN 24, normal creatinine. 30 K colonies E. Coli, 30 K colonies Staphylococcus in the urine Growing Klebsiella from blood. COVID-19 negative. CTAP with contrast today going diffuse GB wall edema, pericholecystic fluid.  Periportal edema and 2 lesions in the right lobe of liver discerning for abscesses.  Possible sludge or noncalcified stone may be present in GB.  Pancreatic duct, biliary duct, intrahepatic ducts not dilated.  Colon underdistended MRCP ordered.  Mental status improved with antibiotics, IV fluids, insulin History obtained mostly from patient's son who acted as Optometrist.  Although she has TTP in the right upper quadrant, pain in the right scapula behind the gallbladder region, at home there has been no complaint of abdominal pain.  There was no nausea.  Her appetite generally is okay but her diet consists predominantly of white rice with some vegetables and some meat.  A couple of days PTA, her appetite decreased.  Social history No alcohol.  Lives with her son.  Divorced.  2  daughters live in [place].  Family history.  Not a lot known but no history of gallbladder or liver disease.  Past Medical History:  Diagnosis Date  . Allergy   . Diabetes mellitus type 2, diet-controlled ([place])   . PAF (paroxysmal atrial fibrillation) ([place]) 09/04/2017  . Sinus bradycardia 09/04/2017   HR 30s at times  . Tachy-brady syndrome ([place]) 09/04/2017    Past Surgical History:  Procedure Laterality Date  . PACEMAKER IMPLANT N/A 09/05/2017   Procedure: Pacemaker Implant;  Surgeon: Deboraha Sprang, MD;  Location: [facility];  Service: Cardiovascular;  Laterality: N/A;    Prior to Admission medications   Medication Sig Start Date End Date Taking? Authorizing Provider  acetaminophen (TYLENOL) 325 MG tablet Take 1-2 tablets (325-650 mg total) by mouth every 6 (six) hours as needed for mild pain or moderate pain. 09/06/17  Yes Baldwin [place], PA-C  apixaban (ELIQUIS) 5 MG TABS tablet Take 1 tablet (5 mg total) by mouth 2 (two) times daily. 07/26/19  Yes Deboraha Sprang, MD  flecainide (TAMBOCOR) 50 MG tablet Take 1 tablet (50 mg total) by mouth 2 (two) times daily. 07/26/19  Yes Deboraha Sprang, MD  metoprolol tartrate (LOPRESSOR) 50 MG tablet Take 1 tablet (50 mg total) by mouth 2 (two) times daily. Please  keep upcoming appt in September with Dr. Caryl Comes before anymore refills. Thank you 08/31/19  Yes Deboraha Sprang, MD  glucose monitoring kit (FREESTYLE) monitoring kit Check cbgs bid as instructed by pcp. 01/24/16   Shawnee Knapp, MD    Scheduled Meds: . flecainide  50 mg Oral BID  . insulin aspart  0-15 Units Subcutaneous Q4H  . insulin glargine  10 Units Subcutaneous Daily  . metoprolol tartrate  50 mg Oral BID  . potassium chloride  40 mEq Oral Once   Infusions: . sodium chloride 250 mL (10/08/19 0250)  . meropenem (MERREM) IV 1 g (10/08/19 0952)   PRN Meds: sodium chloride, acetaminophen, dextrose   Allergies as of 10/06/2019  . (No Known Allergies)    Family  History  Problem Relation Age of Onset  . Diabetes Son     Social History   Socioeconomic History  . Marital status: Single    Spouse name: Not on file  . Number of children: Not on file  . Years of education: Not on file  . Highest education level: Not on file  Occupational History  . Not on file  Social Needs  . Financial resource strain: Not on file  . Food insecurity    Worry: Not on file    Inability: Not on file  . Transportation needs    Medical: Not on file    Non-medical: Not on file  Tobacco Use  . Smoking status: Current Every Day Smoker  . Smokeless tobacco: Never Used  Substance and Sexual Activity  . Alcohol use: No    Frequency: Never  . Drug use: No  . Sexual activity: Not on file  Lifestyle  . Physical activity    Days per week: Not on file    Minutes per session: Not on file  . Stress: Not on file  Relationships  . Social Herbalist on phone: Not on file    Gets together: Not on file    Attends religious service: Not on file    Active member of club or organization: Not on file    Attends meetings of clubs or organizations: Not on file    Relationship status: Not on file  . Intimate partner violence    Fear of current or ex partner: Not on file    Emotionally abused: Not on file    Physically abused: Not on file    Forced sexual activity: Not on file  Other Topics Concern  . Not on file  Social History Narrative  . Not on file    REVIEW OF SYSTEMS: Constitutional: Up until a month or so ago she would work a little bit in the garden.  No gait instability, no weakness. ENT:  No nose bleeds Pulm: No cough.  Does feel short of breath.  Points to her mid sternum as a source of pain when she takes a deep breath. CV:  No palpitations, no LE edema.  No angina GU:  No hematuria, no frequency GI: See HPI. Heme: No unusual bleeding or bruising. Transfusions: None. Neuro: Memory deficits per patient's son.  No headaches, no peripheral  tingling or numbness.  No syncope, no seizures. Derm:  No itching, no rash or sores.  No jaundice. Endocrine:   No polyuria or dysuria Immunization: Reviewed.  There is no records of remote or recent immunizations. Travel:  None beyond local counties in last few months.    PHYSICAL EXAM: Vital signs in last 24  hours: Vitals:   10/08/19 0754 10/08/19 0844  BP: 102/64 101/62  Pulse: 60 63  Resp: 18   Temp: 98 F (36.7 C)   SpO2: 98%    Wt Readings from Last 3 Encounters:  10/08/19 47.7 kg  09/13/18 44 kg  08/29/18 43.7 kg    General: Uncomfortable, slightly diaphoretic, unwell appearing Asian female. Head: No facial asymmetry or swelling.  No signs of head trauma. Eyes: No scleral icterus, no conjunctival pallor.  EOMI. Ears: Not hard of hearing Nose: No congestion or discharge. Mouth: Tongue midline.  Dental caries.  Oral mucosa moist, clear. Neck: No JVD, no masses, no thyromegaly. Lungs: Slight tachypnea and slight grunting expirations. Heart: RRR.  No MRG.  S1, S2 present. Abdomen: Soft.  Tenderness without guarding or rebound in the right upper quadrant.  Not distended.  Active bowel sounds. Rectal: Deferred Musc/Skeltl: No joint redness, swelling or significant deformities. Extremities: Slight, nonpitting pedal edema. Neurologic: Alert.  Unable to state the date/year/location. Skin: No obvious jaundice. Tattoos: None observed Nodes: No cervical adenopathy Psych: Cooperative.  A little bit agitated, paucity of speech.  Intake/Output from previous day: 10/25 0701 - 10/26 0700 In: 1184.1 [P.O.:480; I.V.:4.3; IV Piggyback:699.8] Out: 300 [Urine:300] Intake/Output this shift: No intake/output data recorded.  LAB RESULTS: Recent Labs    10/06/19 1240 10/07/19 0429 10/08/19 0531  WBC 14.9* 7.3 10.4  HGB 11.9* 10.5* 10.6*  HCT 37.1 30.4* 29.8*  PLT 101* 55* 43*   BMET Lab Results  Component Value Date   NA 134 (L) 10/08/2019   NA 137 10/07/2019   NA 136  10/06/2019   K 3.4 (L) 10/08/2019   K 2.1 (LL) 10/07/2019   K 3.1 (L) 10/06/2019   CL 109 10/08/2019   CL 110 10/07/2019   CL 108 10/06/2019   CO2 14 (L) 10/08/2019   CO2 15 (L) 10/07/2019   CO2 14 (L) 10/06/2019   GLUCOSE 162 (H) 10/08/2019   GLUCOSE 95 10/07/2019   GLUCOSE 186 (H) 10/06/2019   BUN 24 (H) 10/08/2019   BUN 12 10/07/2019   BUN 16 10/06/2019   CREATININE 0.76 10/08/2019   CREATININE 0.73 10/07/2019   CREATININE 0.89 10/06/2019   CALCIUM 7.8 (L) 10/08/2019   CALCIUM 8.0 (L) 10/07/2019   CALCIUM 8.2 (L) 10/06/2019   LFT Recent Labs    10/06/19 1240 10/07/19 0429 10/08/19 0531  PROT 6.7 5.4* 5.4*  ALBUMIN 2.8* 2.0* 2.0*  AST 64* 56* 2,353*  ALT 84* 59* 885*  ALKPHOS 118 99 98  BILITOT 1.5* 0.5 0.9  BILIDIR  --  <0.1 0.2  IBILI  --  NOT CALCULATED 0.7   PT/INR Lab Results  Component Value Date   INR 1.2 10/06/2019   Hepatitis Panel No results for input(s): HEPBSAG, HCVAB, HEPAIGM, HEPBIGM in the last 72 hours. C-Diff No components found for: CDIFF Lipase     Component Value Date/Time   LIPASE 34 09/04/2017 1054    Drugs of Abuse  No results found for: LABOPIA, COCAINSCRNUR, LABBENZ, AMPHETMU, THCU, LABBARB   RADIOLOGY STUDIES: Ct Head Wo Contrast  Result Date: 10/06/2019 CLINICAL DATA:  Altered level of consciousness and fever EXAM: CT HEAD WITHOUT CONTRAST TECHNIQUE: Contiguous axial images were obtained from the base of the skull through the vertex without intravenous contrast. COMPARISON:  None. FINDINGS: Brain: The ventricles are normal in size and configuration. There is no intracranial mass, hemorrhage, extra-axial fluid collection, or midline shift. Brain parenchyma appears unremarkable. No acute infarct evident. Vascular: There  is no hyperdense vessel. There is calcification in each carotid siphon region. Skull: The bony calvarium appears intact. Sinuses/Orbits: There is mucosal thickening in several ethmoid air cells. Other visualized  paranasal sinuses are clear. Visualized orbits appear symmetric bilaterally. Other: Mastoid air cells are clear. IMPRESSION: Brain parenchyma appears unremarkable. No evident acute infarct. No mass or hemorrhage. There are foci of arterial vascular calcification. There is mucosal thickening in several ethmoid air cells. Electronically Signed   By: Lowella Grip III M.D.   On: 10/06/2019 14:55   Ct Abdomen Pelvis W Contrast  Result Date: 10/08/2019 CLINICAL DATA:  73 year old female with bacteremia. EXAM: CT ABDOMEN AND PELVIS WITH CONTRAST TECHNIQUE: Multidetector CT imaging of the abdomen and pelvis was performed using the standard protocol following bolus administration of intravenous contrast. CONTRAST:  139m OMNIPAQUE IOHEXOL 300 MG/ML  SOLN COMPARISON:  None. FINDINGS: Evaluation of this exam is limited due to respiratory motion artifact. Lower chest: Partially visualized small bilateral pleural effusions with associated partial compressive atelectasis of the lower lobes. Multiple patchy areas of airspace consolidation in the visualized lung fields most consistent with multifocal pneumonia. Several peripheral and subpleural airspace opacity may have central cavitation (series 3, image 1) and may represent fungal infection, abscesses, or septic emboli. Clinical correlation is recommended. Cardiac pacemaker wires noted. No intra-abdominal free air. There is a small free fluid within the pelvis as well as a small perihepatic free fluid. Hepatobiliary: There is heterogeneous enhancement of the liver. Two adjacent hypoenhancing areas noted in the right lobe of the liver each measuring approximately 2.4 x 2.4 cm concerning for areas of infection or developing abscesses. There is mild periportal edema. There is diffuse thickening of the gallbladder wall with pericholecystic fluid. No definite calcified stone identified. Evaluation of the gallbladder is very limited due to respiratory motion artifact. Sludge  or noncalcified stone may be present within the gallbladder. Further evaluation with ultrasound or MRCP is recommended. Pancreas: Unremarkable. No pancreatic ductal dilatation or surrounding inflammatory changes. Spleen: Normal in size without focal abnormality. Adrenals/Urinary Tract: The adrenal glands are unremarkable. There is no hydronephrosis on either side. There is symmetric enhancement and excretion of contrast by both kidneys. The visualized ureters and urinary bladder appear unremarkable. Stomach/Bowel: There is no bowel obstruction. Mild diffuse thickened appearance of the colonic wall may be related to underdistention. Clinical correlation is recommended to evaluate for mild colitis. The appendix is normal. Vascular/Lymphatic: Mild aortoiliac atherosclerotic disease. The IVC is unremarkable. No portal venous gas. Mildly enlarged periportal lymph nodes, reactive. Reproductive: The uterus is grossly unremarkable. No adnexal masses. Other: There is diffuse subcutaneous edema. No fluid collection. Musculoskeletal: Degenerative changes of the spine. No acute osseous pathology. IMPRESSION: 1. Small bilateral pleural effusions and findings of multifocal pneumonia. Clinical correlation is recommended. 2. Diffuse gallbladder wall edema and pericholecystic fluid. Additionally there is periportal edema and findings concerning for developing intrahepatic abscesses. Findings may represent cholangitis. Clinical correlation and further evaluation with MRCP is recommended. 3. Underdistention of the colon versus less likely mild colitis. Clinical correlation is recommended. No bowel obstruction. Normal appendix. Aortic Atherosclerosis (ICD10-I70.0). Electronically Signed   By: AAnner CreteM.D.   On: 10/08/2019 00:18   Dg Chest Port 1 View  Result Date: 10/07/2019 CLINICAL DATA:  Shortness of breath EXAM: PORTABLE CHEST 1 VIEW COMPARISON:  10/06/2019 FINDINGS: The heart size and mediastinal contours are within  normal limits. Left chest multi lead pacer. Increased multifocal heterogeneous opacity of the bilateral mid lungs and perihilar right lung.  The visualized skeletal structures are unremarkable. IMPRESSION: Increased multifocal heterogeneous opacity of the bilateral mid lungs and perihilar right lung, concerning for multifocal infection. Electronically Signed   By: Eddie Candle M.D.   On: 10/07/2019 19:26   Dg Chest Port 1 View  Result Date: 10/06/2019 CLINICAL DATA:  Altered mental status EXAM: PORTABLE CHEST 1 VIEW COMPARISON:  09/06/2017 FINDINGS: There is no focal consolidation. There is no pleural effusion or pneumothorax. The heart and mediastinal contours are unremarkable. 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 There is thoracic aortic atherosclerosis. There is no acute osseous abnormality. IMPRESSION: No active disease. Electronically Signed   By: Kathreen Devoid   On: 10/06/2019 12:49   [place] Ekg Site Rite  Result Date: 10/07/2019 If Site Rite image not attached, placement could not be confirmed due to current cardiac rhythm.    IMPRESSION:   *   Klebsiella sepsis, likely due to cholangitis.  Cholecystitis?  marked rise of transaminases in the last 48 hours. ?  Liver abscesses. She has been hypotensive so there may be an element of shock liver in addition to cholangitis and possible cholecystitis Antibiotics thus far include a dose of acyclovir, 2 doses of ampicillin, 2 doses of Rocephin, 2 doses vancomycin, 1 dose meropenem. The meropenem is the current active/ongoing antibiotic.  *    Cardiac pacemaker in situ for tachybradycardia syndrome.  *     Hypokalemia.  *     DKA.  Insulin in place.  Previously "diet controlled diabetes".  Hemoglobin A1c 16.4 currently.      PLAN:     *   Obtain acute hepatitis serologies to make sure she does not have acute hepatitis.  Obtain PT/INR  *   Agree with plans for MRCP though I am not sure she is going to be able to cooperate enough for this  test to be helpful.  Worth a shot.   Azucena Freed  10/08/2019, 11:23 AM Phone [phone]

## 2019-10-08 NOTE — Progress Notes (Signed)
PROGRESS NOTE    Courtney [name]  DJM:[id number] DOB: 12/02/1946 DOA: 10/06/2019 PCP: Patient, No Pcp Per     Brief Narrative:  Courtney [name] is a [place] speaking 73 year old female with past medical history significant for type 2 diabetes, atrial fibrillation, tachybradycardia syndrome who was brought into the emergency department with chief complaint of altered mental status.  In the emergency department, there was concern for meningitis and patient was started on vancomycin, ampicillin, ceftriaxone.  LP not pursued in the emergency department as patient was on Eliquis and was unclear when her last dose was.  She was also found in DKA and was started on IV insulin.  New events last 24 hours / Subjective: History gathered using iPad interpreter at bedside.  Patient complains of left-sided chest pain where she had a pacemaker implanted 2 years ago.  She states that this has been bothering her since the surgery.  Denies any abdominal pain, although has some nausea complaints.  Mentation seems back to her baseline.  Assessment & Plan:   Active Problems:   Altered mental status   AMS (altered mental status)   Acute metabolic encephalopathy -Multifactorial in setting of DKA as well as underlying sepsis -Returned back to baseline   Severe sepsis secondary to Klebsiella bacteremia, ESBL UTI, multifocal pneumonia  -At the time of admission there was concern for meningitis due to patient's fever, altered mental status, headache.  LP was not pursued at admission due to patient being on Eliquis.  Son confirms that patient's last Eliquis dose was 2 days ago.  LP not further pursued as patient has other source of infection to explain her presenting symptoms.  Meningitis is low on the differential. -Blood culture returned with Klebsiella, susceptibility pending  -CT abdomen pelvis revealed diffuse gallbladder edema, pericholecystic fluid, concern for intrahepatic abscesses, concern for cholangitis  -MRCP  ordered to evaluate for cholangitis -Urine culture returned with ESBL EColi and staph epidermidis  -Antibiotics changed to Merrem -LFT markedly elevated overnight -GI consulted  Paroxysmal atrial fibrillation with RVR -Patient was started on Cardizem drip at the time of admission -Currently in normal sinus rhythm with rate control  Tachybradycardia syndrome -Pacemaker placed 2018 -Continue flecainide, metoprolol  DKA -Hemoglobin A1c 16.4 -Patient required IV insulin during hospitalization and has now been transitioned to Lantus and sliding scale insulin  Hypokalemia -Replace, trend    DVT prophylaxis: SCD (Eliquis PTA) Code Status: Full code Family Communication: No family at bedside, left VM for son to call back  Disposition Plan: Pending further work-up   Consultants:   GI  Procedures:   None  Antimicrobials:  Anti-infectives (From admission, onward)   Start     Dose/Rate Route Frequency Ordered Stop   10/08/19 1000  meropenem (MERREM) 1 g in sodium chloride 0.9 % 100 mL IVPB     1 g 200 mL/hr over 30 Minutes Intravenous Every 12 hours 10/08/19 0848     10/07/19 1700  vancomycin (VANCOCIN) IVPB 750 mg/150 ml premix  Status:  Discontinued     750 mg 150 mL/hr over 60 Minutes Intravenous Every 24 hours 10/06/19 1608 10/07/19 0427   10/07/19 1700  vancomycin (VANCOCIN) IVPB 750 mg/150 ml premix  Status:  Discontinued     750 mg 150 mL/hr over 60 Minutes Intravenous Every 24 hours 10/07/19 1247 10/08/19 0836   10/07/19 1245  ampicillin (OMNIPEN) 2 g in sodium chloride 0.9 % 100 mL IVPB  Status:  Discontinued     2 g 300 mL/hr  over 20 Minutes Intravenous Every 6 hours 10/07/19 1238 10/08/19 0836   10/07/19 0200  cefTRIAXone (ROCEPHIN) 2 g in sodium chloride 0.9 % 100 mL IVPB  Status:  Discontinued     2 g 200 mL/hr over 30 Minutes Intravenous Every 12 hours 10/06/19 1610 10/08/19 0836   10/06/19 1615  vancomycin (VANCOCIN) 1,250 mg in sodium chloride 0.9 % 250 mL  IVPB     1,250 mg 166.7 mL/hr over 90 Minutes Intravenous  Once 10/06/19 1608 10/06/19 2111   10/06/19 1615  ampicillin (OMNIPEN) 2 g in sodium chloride 0.9 % 100 mL IVPB  Status:  Discontinued     2 g 300 mL/hr over 20 Minutes Intravenous Every 4 hours 10/06/19 1613 10/07/19 0427   10/06/19 1600  acyclovir (ZOVIRAX) 550 mg in dextrose 5 % 100 mL IVPB     550 mg 111 mL/hr over 60 Minutes Intravenous  Once 10/06/19 1558 10/06/19 2216   10/06/19 1230  cefTRIAXone (ROCEPHIN) 2 g in sodium chloride 0.9 % 100 mL IVPB     2 g 200 mL/hr over 30 Minutes Intravenous  Once 10/06/19 1215 10/06/19 1431       Objective: Vitals:   10/08/19 0420 10/08/19 0641 10/08/19 0754 10/08/19 0844  BP: 111/62  102/64 101/62  Pulse: 64  60 63  Resp: (!) 23  18   Temp:   98 F (36.7 C)   TempSrc:      SpO2: 99%  98%   Weight:  47.7 kg    Height:        Intake/Output Summary (Last 24 hours) at 10/08/2019 1019 Last data filed at 10/08/2019 0935 Gross per 24 hour  Intake 944.1 ml  Output 300 ml  Net 644.1 ml   Filed Weights   10/06/19 1503 10/07/19 0142 10/08/19 0641  Weight: 54.4 kg 45.2 kg 47.7 kg    Examination: General exam: Appears calm and comfortable  Respiratory system: Clear to auscultation. Respiratory effort normal. On room air  Cardiovascular system: S1 & S2 heard, RRR. No pedal edema. TTP over pacemaker site left upper chest  Gastrointestinal system: Abdomen is nondistended, soft and nontender. Normal bowel sounds heard. Central nervous system: Alert and oriented. Speech clear  Extremities: Symmetric in appearance bilaterally  Skin: No rashes, lesions or ulcers on exposed skin  Psychiatry: Stable    Data Reviewed: I have personally reviewed following labs and imaging studies  CBC: Recent Labs  Lab 10/06/19 1240 10/07/19 0429 10/08/19 0531  WBC 14.9* 7.3 10.4  NEUTROABS 12.1*  --   --   HGB 11.9* 10.5* 10.6*  HCT 37.1 30.4* 29.8*  MCV 97.6 89.4 87.6  PLT 101* 55* 43*    Basic Metabolic Panel: Recent Labs  Lab 10/06/19 1240 10/06/19 1633 10/06/19 2014 10/07/19 0429 10/08/19 0531  NA 133* 137 136 137 134*  K 3.3* 3.3* 3.1* 2.1* 3.4*  CL 104 111 108 110 109  CO2 9* 11* 14* 15* 14*  GLUCOSE 369* 325* 186* 95 162*  BUN 20 21 16 12  24*  CREATININE 1.13* 1.05* 0.89 0.73 0.76  CALCIUM 8.3* 7.7* 8.2* 8.0* 7.8*  MG  --   --   --  1.6* 2.1   GFR: Estimated Creatinine Clearance: 47.9 mL/min (by C-G formula based on SCr of 0.76 mg/dL). Liver Function Tests: Recent Labs  Lab 10/06/19 1240 10/07/19 0429 10/08/19 0531  AST 64* 56* 2,353*  ALT 84* 59* 885*  ALKPHOS 118 99 98  BILITOT 1.5* 0.5 0.9  PROT 6.7 5.4* 5.4*  ALBUMIN 2.8* 2.0* 2.0*   No results for input(s): LIPASE, AMYLASE in the last 168 hours. No results for input(s): AMMONIA in the last 168 hours. Coagulation Profile: Recent Labs  Lab 10/06/19 1240  INR 1.2   Cardiac Enzymes: No results for input(s): CKTOTAL, CKMB, CKMBINDEX, TROPONINI in the last 168 hours. BNP (last 3 results) No results for input(s): PROBNP in the last 8760 hours. HbA1C: Recent Labs    10/07/19 0429  HGBA1C 16.4*   CBG: Recent Labs  Lab 10/07/19 0629 10/07/19 1153 10/07/19 1643 10/07/19 2126 10/08/19 0604  GLUCAP 90 299* 283* 273* 161*   Lipid Profile: No results for input(s): CHOL, HDL, LDLCALC, TRIG, CHOLHDL, LDLDIRECT in the last 72 hours. Thyroid Function Tests: No results for input(s): TSH, T4TOTAL, FREET4, T3FREE, THYROIDAB in the last 72 hours. Anemia Panel: No results for input(s): VITAMINB12, FOLATE, FERRITIN, TIBC, IRON, RETICCTPCT in the last 72 hours. Sepsis Labs: Recent Labs  Lab 10/06/19 1240 10/06/19 1633 10/07/19 0653 10/07/19 1039  LATICACIDVEN 1.7 2.4* 2.3* 1.5    Recent Results (from the past 240 hour(s))  Blood Culture (routine x 2)     Status: Abnormal (Preliminary result)   Collection Time: 10/06/19 12:40 PM   Specimen: BLOOD  Result Value Ref Range Status    Specimen Description BLOOD RIGHT ANTECUBITAL  Final   Special Requests   Final    BOTTLES DRAWN AEROBIC AND ANAEROBIC Blood Culture results may not be optimal due to an inadequate volume of blood received in culture bottles   Culture  Setup Time   Final    IN BOTH AEROBIC AND ANAEROBIC BOTTLES GRAM NEGATIVE RODS Organism ID to follow CRITICAL RESULT CALLED TO, READ BACK BY AND VERIFIED WITH: G ABBOTT PHARMD 10/07/19 0419 JDW    Culture (A)  Final    KLEBSIELLA PNEUMONIAE SUSCEPTIBILITIES TO FOLLOW Performed at [facility], [address], [place], [place] [address]    Report Status PENDING  Incomplete  Blood Culture ID Panel (Reflexed)     Status: Abnormal   Collection Time: 10/06/19 12:40 PM  Result Value Ref Range Status   Enterococcus species NOT DETECTED NOT DETECTED Final   Listeria monocytogenes NOT DETECTED NOT DETECTED Final   Staphylococcus species NOT DETECTED NOT DETECTED Final   Staphylococcus aureus (BCID) NOT DETECTED NOT DETECTED Final   Streptococcus species NOT DETECTED NOT DETECTED Final   Streptococcus agalactiae NOT DETECTED NOT DETECTED Final   Streptococcus pneumoniae NOT DETECTED NOT DETECTED Final   Streptococcus pyogenes NOT DETECTED NOT DETECTED Final   Acinetobacter baumannii NOT DETECTED NOT DETECTED Final   Enterobacteriaceae species DETECTED (A) NOT DETECTED Final    Comment: Enterobacteriaceae represent a large family of gram-negative bacteria, not a single organism. CRITICAL RESULT CALLED TO, READ BACK BY AND VERIFIED WITH: G ABBOTT PHARMD 10/07/19 0419 JDW    Enterobacter cloacae complex NOT DETECTED NOT DETECTED Final   Escherichia coli NOT DETECTED NOT DETECTED Final   Klebsiella oxytoca NOT DETECTED NOT DETECTED Final   Klebsiella pneumoniae DETECTED (A) NOT DETECTED Final    Comment: CRITICAL RESULT CALLED TO, READ BACK BY AND VERIFIED WITH: G ABBOTT PHARMD 10/07/19 0419 JDW    Proteus species NOT DETECTED NOT DETECTED Final    Serratia marcescens NOT DETECTED NOT DETECTED Final   Carbapenem resistance NOT DETECTED NOT DETECTED Final   Haemophilus influenzae NOT DETECTED NOT DETECTED Final   Neisseria meningitidis NOT DETECTED NOT DETECTED Final   Pseudomonas aeruginosa  NOT DETECTED NOT DETECTED Final   Candida albicans NOT DETECTED NOT DETECTED Final   Candida glabrata NOT DETECTED NOT DETECTED Final   Candida krusei NOT DETECTED NOT DETECTED Final   Candida parapsilosis NOT DETECTED NOT DETECTED Final   Candida tropicalis NOT DETECTED NOT DETECTED Final    Comment: Performed at [facility], [address], [place], [place] [address]  Blood Culture (routine x 2)     Status: None (Preliminary result)   Collection Time: 10/06/19 12:45 PM   Specimen: BLOOD RIGHT HAND  Result Value Ref Range Status   Specimen Description BLOOD RIGHT HAND  Final   Special Requests   Final    BOTTLES DRAWN AEROBIC AND ANAEROBIC Blood Culture results may not be optimal due to an inadequate volume of blood received in culture bottles   Culture  Setup Time   Final    IN BOTH AEROBIC AND ANAEROBIC BOTTLES GRAM NEGATIVE RODS CRITICAL VALUE NOTED.  VALUE IS CONSISTENT WITH PREVIOUSLY REPORTED AND CALLED VALUE.    Culture   Final    GRAM NEGATIVE RODS IDENTIFICATION TO FOLLOW Performed at [facility], [address], [place], [place] [address]    Report Status PENDING  Incomplete  SARS CORONAVIRUS 2 (TAT 6-24 HRS) Nasopharyngeal Nasopharyngeal Swab     Status: None   Collection Time: 10/06/19  3:35 PM   Specimen: Nasopharyngeal Swab  Result Value Ref Range Status   SARS Coronavirus 2 NEGATIVE NEGATIVE Final    Comment: (NOTE) SARS-CoV-2 target nucleic acids are NOT DETECTED. The SARS-CoV-2 RNA is generally detectable in upper and lower respiratory specimens during the acute phase of infection. Negative results do not preclude SARS-CoV-2 infection, do not rule out co-infections with other pathogens, and should not be  used as the sole basis for treatment or other patient management decisions. Negative results must be combined with clinical observations, patient history, and epidemiological information. The expected result is Negative. Fact Sheet for Patients: HairSlick.no Fact Sheet for Healthcare Providers: quierodirigir.com This test is not yet approved or cleared by the [place] FDA and  has been authorized for detection and/or diagnosis of SARS-CoV-2 by FDA under an Emergency Use Authorization (EUA). This EUA will remain  in effect (meaning this test can be used) for the duration of the COVID-19 declaration under Section 56 4(b)(1) of the Act, 21 U.S.C. section 360bbb-3(b)(1), unless the authorization is terminated or revoked sooner. Performed at [facility], [address] [place], [place] [address]   Urine culture     Status: Abnormal   Collection Time: 10/06/19  4:02 PM   Specimen: In/Out Cath Urine  Result Value Ref Range Status   Specimen Description IN/OUT CATH URINE  Final   Special Requests   Final    NONE Performed at [facility], [address], [place], [place] [address]    Culture (A)  Final    30,000 COLONIES/mL ESCHERICHIA COLI Confirmed Extended Spectrum Beta-Lactamase Producer (ESBL).  In bloodstream infections from ESBL organisms, carbapenems are preferred over piperacillin/tazobactam. They are shown to have a lower risk of mortality. 10,000 COLONIES/mL STAPHYLOCOCCUS EPIDERMIDIS    Report Status 10/08/2019 FINAL  Final   Organism ID, Bacteria ESCHERICHIA COLI (A)  Final   Organism ID, Bacteria STAPHYLOCOCCUS EPIDERMIDIS (A)  Final      Susceptibility   Escherichia coli - MIC*    AMPICILLIN >=32 RESISTANT Resistant     CEFAZOLIN >=64 RESISTANT Resistant  CEFTRIAXONE >=64 RESISTANT Resistant     CIPROFLOXACIN >=4 RESISTANT Resistant     GENTAMICIN <=1 SENSITIVE Sensitive     IMIPENEM <=0.25  SENSITIVE Sensitive     NITROFURANTOIN <=16 SENSITIVE Sensitive     TRIMETH/SULFA <=20 SENSITIVE Sensitive     AMPICILLIN/SULBACTAM 4 SENSITIVE Sensitive     PIP/TAZO <=4 SENSITIVE Sensitive     Extended ESBL POSITIVE Resistant     * 30,000 COLONIES/mL ESCHERICHIA COLI   Staphylococcus epidermidis - MIC*    CIPROFLOXACIN 2 INTERMEDIATE Intermediate     GENTAMICIN <=0.5 SENSITIVE Sensitive     NITROFURANTOIN <=16 SENSITIVE Sensitive     OXACILLIN >=4 RESISTANT Resistant     TETRACYCLINE <=1 SENSITIVE Sensitive     VANCOMYCIN 1 SENSITIVE Sensitive     TRIMETH/SULFA <=10 SENSITIVE Sensitive     CLINDAMYCIN <=0.25 SENSITIVE Sensitive     RIFAMPIN <=0.5 SENSITIVE Sensitive     Inducible Clindamycin NEGATIVE Sensitive     * 10,000 COLONIES/mL STAPHYLOCOCCUS EPIDERMIDIS      Radiology Studies: Ct Head Wo Contrast  Result Date: 10/06/2019 CLINICAL DATA:  Altered level of consciousness and fever EXAM: CT HEAD WITHOUT CONTRAST TECHNIQUE: Contiguous axial images were obtained from the base of the skull through the vertex without intravenous contrast. COMPARISON:  None. FINDINGS: Brain: The ventricles are normal in size and configuration. There is no intracranial mass, hemorrhage, extra-axial fluid collection, or midline shift. Brain parenchyma appears unremarkable. No acute infarct evident. Vascular: There is no hyperdense vessel. There is calcification in each carotid siphon region. Skull: The bony calvarium appears intact. Sinuses/Orbits: There is mucosal thickening in several ethmoid air cells. Other visualized paranasal sinuses are clear. Visualized orbits appear symmetric bilaterally. Other: Mastoid air cells are clear. IMPRESSION: Brain parenchyma appears unremarkable. No evident acute infarct. No mass or hemorrhage. There are foci of arterial vascular calcification. There is mucosal thickening in several ethmoid air cells. Electronically Signed   By: Lowella Grip III M.D.   On: 10/06/2019  14:55   Ct Abdomen Pelvis W Contrast  Result Date: 10/08/2019 CLINICAL DATA:  73 year old female with bacteremia. EXAM: CT ABDOMEN AND PELVIS WITH CONTRAST TECHNIQUE: Multidetector CT imaging of the abdomen and pelvis was performed using the standard protocol following bolus administration of intravenous contrast. CONTRAST:  136mL OMNIPAQUE IOHEXOL 300 MG/ML  SOLN COMPARISON:  None. FINDINGS: Evaluation of this exam is limited due to respiratory motion artifact. Lower chest: Partially visualized small bilateral pleural effusions with associated partial compressive atelectasis of the lower lobes. Multiple patchy areas of airspace consolidation in the visualized lung fields most consistent with multifocal pneumonia. Several peripheral and subpleural airspace opacity may have central cavitation (series 3, image 1) and may represent fungal infection, abscesses, or septic emboli. Clinical correlation is recommended. Cardiac pacemaker wires noted. No intra-abdominal free air. There is a small free fluid within the pelvis as well as a small perihepatic free fluid. Hepatobiliary: There is heterogeneous enhancement of the liver. Two adjacent hypoenhancing areas noted in the right lobe of the liver each measuring approximately 2.4 x 2.4 cm concerning for areas of infection or developing abscesses. There is mild periportal edema. There is diffuse thickening of the gallbladder wall with pericholecystic fluid. No definite calcified stone identified. Evaluation of the gallbladder is very limited due to respiratory motion artifact. Sludge or noncalcified stone may be present within the gallbladder. Further evaluation with ultrasound or MRCP is recommended. Pancreas: Unremarkable. No pancreatic ductal dilatation or surrounding inflammatory changes. Spleen: Normal in size without  focal abnormality. Adrenals/Urinary Tract: The adrenal glands are unremarkable. There is no hydronephrosis on either side. There is symmetric  enhancement and excretion of contrast by both kidneys. The visualized ureters and urinary bladder appear unremarkable. Stomach/Bowel: There is no bowel obstruction. Mild diffuse thickened appearance of the colonic wall may be related to underdistention. Clinical correlation is recommended to evaluate for mild colitis. The appendix is normal. Vascular/Lymphatic: Mild aortoiliac atherosclerotic disease. The IVC is unremarkable. No portal venous gas. Mildly enlarged periportal lymph nodes, reactive. Reproductive: The uterus is grossly unremarkable. No adnexal masses. Other: There is diffuse subcutaneous edema. No fluid collection. Musculoskeletal: Degenerative changes of the spine. No acute osseous pathology. IMPRESSION: 1. Small bilateral pleural effusions and findings of multifocal pneumonia. Clinical correlation is recommended. 2. Diffuse gallbladder wall edema and pericholecystic fluid. Additionally there is periportal edema and findings concerning for developing intrahepatic abscesses. Findings may represent cholangitis. Clinical correlation and further evaluation with MRCP is recommended. 3. Underdistention of the colon versus less likely mild colitis. Clinical correlation is recommended. No bowel obstruction. Normal appendix. Aortic Atherosclerosis (ICD10-I70.0). Electronically Signed   By: Elgie Collard M.D.   On: 10/08/2019 00:18   Dg Chest Port 1 View  Result Date: 10/07/2019 CLINICAL DATA:  Shortness of breath EXAM: PORTABLE CHEST 1 VIEW COMPARISON:  10/06/2019 FINDINGS: The heart size and mediastinal contours are within normal limits. Left chest multi lead pacer. Increased multifocal heterogeneous opacity of the bilateral mid lungs and perihilar right lung. The visualized skeletal structures are unremarkable. IMPRESSION: Increased multifocal heterogeneous opacity of the bilateral mid lungs and perihilar right lung, concerning for multifocal infection. Electronically Signed   By: Lauralyn Primes M.D.    On: 10/07/2019 19:26   Dg Chest Port 1 View  Result Date: 10/06/2019 CLINICAL DATA:  Altered mental status EXAM: PORTABLE CHEST 1 VIEW COMPARISON:  09/06/2017 FINDINGS: There is no focal consolidation. There is no pleural effusion or pneumothorax. The heart and mediastinal contours are unremarkable. 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 There is thoracic aortic atherosclerosis. There is no acute osseous abnormality. IMPRESSION: No active disease. Electronically Signed   By: Elige Ko   On: 10/06/2019 12:49   [place] Ekg Site Rite  Result Date: 10/07/2019 If Site Rite image not attached, placement could not be confirmed due to current cardiac rhythm.     Scheduled Meds:  flecainide  50 mg Oral BID   insulin aspart  0-15 Units Subcutaneous TID WC   insulin glargine  10 Units Subcutaneous Daily   metoprolol tartrate  50 mg Oral BID   Continuous Infusions:  sodium chloride 250 mL (10/08/19 0250)   meropenem (MERREM) IV 1 g (10/08/19 0952)     LOS: 2 days      Time spent: 40 minutes   Noralee Stain, DO [facility] 10/08/2019, 10:19 AM   Available via Epic secure chat 7am-7pm After these hours, please refer to coverage provider listed on [email or web address]

## 2019-10-08 NOTE — Progress Notes (Addendum)
Attending note: I have seen and examined the patient. History, labs and imaging reviewed.  73 year old with history of atrial fibrillation, tachybradycardia syndrome status post pacemaker, diabetes presenting with DKA, sepsis with Klebsiella bacteremia, E. coli UTI, acute cholangitis, possible intrahepatic abscess.  PCCM consulted on 10/26 for hypoxia, encephalopathy, concern for worsening sepsis.  Rapid response noted some stridor for which she got IV Solu-Medrol and epi nebs  Blood pressure 106/89, pulse 85, temperature 98.4 F (36.9 C), temperature source Axillary, resp. rate (!) 34, height 5\' 1"  (1.549 m), weight 47.7 kg, SpO2 97 %. Gen:      Moderate distress, moaning HEENT:  EOMI, sclera anicteric Neck:     No masses; no thyromegaly Lungs:    Clear to auscultation bilaterally; normal respiratory effort CV:         Regular rate and rhythm; no murmurs Abd:   Mild abdominal tenderness. Ext:    No edema; adequate peripheral perfusion Skin:      Warm and dry; no rash Neuro: Awake, follows simple commands  Labs/Imaging personally reviewed, significant for ABG 7.45, PO2 102, sat 97%.  PCO2 undetectably low Sodium 134, potassium 3.4, BUN/creatinine 24/0.76, AST 2353, ALT 885 Hemoglobin 10.6, platelets 43  CT abdomen pelvis 10/26-mild bilateral effusion with patchy airspace consolidation, gallbladder edema with pericolecystic fluid, periportal edema concerning for intrahepatic abscess. Chest x-ray 10/26-bilateral airspace opacities. I reviewed the images personally.  Assessment/plan: Acute respiratory distress, stridor She is protecting airway for now with no evidence of upper outer upper obstruction We will transfer to ICU.  Monitor for intubation needs Continue bronchodilators.  Hold further systemic steroids as she is septic  Sepsis with multilobar pneumonia, bacteremia, UTI, cholangitis Continue meropenem for ESBL, fluid resuscitation Check lactic acid GI consulted.  Final reccs  are pending. May need surgery, IR to see.  Will need MRCP when stable  Uncontrolled diabetes, DKA on admission next Continue SSI, Lantus  The patient is critically ill with multiple organ systems failure and requires high complexity decision making for assessment and support, frequent evaluation and titration of therapies, application of advanced monitoring technologies and extensive interpretation of multiple databases.  Critical care time - 35 mins. This represents my time independent of the NPs time taking care of the pt.  Marshell Garfinkel MD [facility] 10/08/2019, 4:42 PM

## 2019-10-08 NOTE — Progress Notes (Signed)
CRITICAL VALUE ALERT  Critical Value:  Lactic acid 4.3  Date & Time Notied: 1700  Provider Notified: Dr.Choi  Orders Received/Actions taken: inplace

## 2019-10-08 NOTE — Progress Notes (Signed)
Inpatient Diabetes Program Recommendations  AACE/ADA: New Consensus Statement on Inpatient Glycemic Control (2015)  Target Ranges:  Prepandial:   less than 140 mg/dL      Peak postprandial:   less than 180 mg/dL (1-2 hours)      Critically ill patients:  140 - 180 mg/dL   Lab Results  Component Value Date   GLUCAP 126 (H) 10/08/2019   HGBA1C 16.4 (H) 10/07/2019    Review of Glycemic Control Results for [name], Courtney H (MRN [id number]) as of 10/08/2019 15:17  Ref. Range 10/07/2019 16:43 10/07/2019 21:26 10/08/2019 06:04 10/08/2019 12:38  Glucose-Capillary Latest Ref Range: 70 - 99 mg/dL 283 (H) 273 (H) 161 (H) 126 (H)   Diabetes history: Type 2 DM Outpatient Diabetes medications: none Current orders for Inpatient glycemic control: Novolog 0-15 units Q4H, Lantus 10 units QD Solumedrol 80 mg x 1  Inpatient Diabetes Program Recommendations:    Noted addition of Solumedrol, thus anticipate glucose trends to increase.  Consider increasing correction to Novolog 0-20 units Q4H.   Spoke with patient and son using interpreter regarding diabetes management outpatient.  Reviewed patient's current A1c of 16.4%. Explained what a A1c is and what it measures. Also reviewed goal A1c with patient, importance of good glucose control @ home, and blood sugar goals. Reviewed role of pancreas, patho of DM, impact of glycemic control to increase risk for infection, differences in inpatient glucose trends compared to A1C average, vascular changes and co-morbidities.  Patient will need a meter at discharge. Blood glucose meter kit (inlcudes lancets and strips) (#[id number]). Encouraged son to begin learning how to check CBGs and reviewed frequency of blood sugar checks and when to call MD.  Educated patient and son on insulin pen use at home. Reviewed contents of insulin flexpen starter kit. Reviewed all steps if insulin pen including attachment of needle, 2-unit air shot, dialing up dose, giving injection, removing  needle, disposal of sharps, storage of unused insulin, disposal of insulin etc. Patient able to provide successful return demonstration. Also reviewed troubleshooting with insulin pen. MD to give patient Rxs for insulin pens and insulin pen needles.  Thanks, Bronson Curb, MSN, RNC-OB Diabetes Coordinator [phone] (8a-5p)

## 2019-10-08 NOTE — Significant Event (Signed)
Rapid Response Event Note  Overview: Time Called: 2774 Arrival Time: 1287 Event Type: Respiratory  Initial Focused Assessment: Patient with acute onset of respiratory distress.  Upon my arrival patient of O2 sat in the 70s, increased wob upper airway wheezes.  Legs mottled. SR 90s  RR 24-28   Interventions: Placed on NRB  O2 sats 98% 80mg  Solumedrol given IV Racemic Epi inhaler given Increase wheezes but improved air movement 143/72  SR 80  NRB  100% sat  RR 26 Dr Maylene [name] at bedside ABG done CBG 207 Patient improving, now pink WOB improved, Wheezing improved PCXR done But still tachypnic.   Mosetta Pigeon NP (CCM) at bedside to assess patient. ABG resulted  7.45/pCO2 below reportable range/ 102/ 11 BP 89/55  SR 83  RR 31  O2 sat 96% on 6L [place] [address] Vaughan Browner at bedside Lactic Acid 4.3 LR bolus BP 112/77  SR 79  RR 28  O2 sat 98% on 6L [place]  Transported to 3M05     Plan of Care (if not transferred):  Event Summary: Name of Physician Notified: Dessa Phi at [phone]    at       Event End Time: 1830  Raliegh Ip

## 2019-10-08 NOTE — Progress Notes (Signed)
  PROGRESS NOTE  Patient re-evaluated this afternoon when RN noted worsening dyspnea, desat to 70% on room air. Patient put on NRB with improvement in sat. Patient's son and RRT RN at bedside. IPad interpreter utilized for translation. Per son, patient had complaints of some pain in her bladder area this afternoon but otherwise no complaints. Then she suddenly started having worsening SOB. Only change today was change in antibiotics to [facility], which was given at 9:52am. Due to concern for stridor, patient was given solumedrol and racemic epi. On examination, her mental status has deteriorated since my examination this morning. She keeps her eyes closed, able to hear her son but with minimal responses. She is moaning and her knees appear mottled. She is satting 100% on NRB but appears critically ill. Confirmed FULL CODE status with son at bedside, including CPR and MV.   Stat CXR, ABG, lactic acid PCCM consulted   Dessa Phi, DO [facility] 10/08/2019, 3:36 PM  Available via Epic secure chat 7am-7pm After these hours, please refer to coverage provider listed on [email or web address]    Total critical care time: 35 minutes. Time 3:10-5:45pm Critical care time was exclusive of separately billable procedures and treating other patients. Critical care was necessary to treat or prevent imminent or life-threatening deterioration. Critical care was time spent personally by me on the following activities: development of treatment plan with patient and/or surrogate as well as nursing, discussions with consultants, evaluation of patient's response to treatment, examination of patient, obtaining history from patient or surrogate, ordering and performing treatments and interventions, ordering and review of laboratory studies, ordering and review of radiographic studies, pulse oximetry and re-evaluation of patient's condition.

## 2019-10-08 NOTE — Progress Notes (Signed)
Pharmacy Antibiotic Note  Courtney [name] is a 73 y.o. female admitted on 10/06/2019 with sepsis and initial concern for meningitis. Pt noted to be growing Kleb pneumo within blood, also with ESBL-E. Coli within urine as 30k colonies. Pharmacy asked to narrow antimicrobials to meropenem given resistance of urine organism and new concern for cholangitis on CT abdomen from 10/25. Blood culture sensitivities still pending on Klebsiella. CrCl ~47 ml/min.   Plan: -Stop vancomycin, ampicillin, ceftriaxone -Meropenem 1g IV q12h   Height: 5\' 1"  (154.9 cm) Weight: 105 lb 2.6 oz (47.7 kg) IBW/kg (Calculated) : 47.8  Temp (24hrs), Avg:97.9 F (36.6 C), Min:97.4 F (36.3 C), Max:98.6 F (37 C)  Recent Labs  Lab 10/06/19 1240 10/06/19 1633 10/06/19 2014 10/07/19 0429 10/07/19 0653 10/07/19 1039 10/08/19 0531  WBC 14.9*  --   --  7.3  --   --  10.4  CREATININE 1.13* 1.05* 0.89 0.73  --   --   --   LATICACIDVEN 1.7 2.4*  --   --  2.3* 1.5  --     Estimated Creatinine Clearance: 47.9 mL/min (by C-G formula based on SCr of 0.73 mg/dL).    No Known Allergies  Antimicrobials this admission:  Acyclovir x1 10/24 Ceftriaxone 10/24 >> 10/26 Vancomycin 10/24 >> 10/26 Ampicillin 10/24 >> 10/26 Meropenem 10/26 >>   Microbiology results:  10/24 BCx: Kleb pneumo 10/24 UCx: 30k E. Coli (ESBL), 10k CoNS   Arrie Senate, PharmD, BCPS Clinical Pharmacist [phone] Please check AMION for all [place] numbers 10/08/2019

## 2019-10-08 NOTE — Progress Notes (Signed)
Patient complaining of pain around her pacemaker.  Per patient's son she has this pain at home sometimes.  Not due for tylenol, TRH paged to notify of pain and location of pain.

## 2019-10-08 NOTE — Progress Notes (Signed)
1515-Was called in the pts room. Son at bedside.Pt was having labored breathing Sats dropped on the 70's. Noted lower extreme ties mottled lips turning blue. Pt. was placed on non rebreather.Paged RRT Rn to eval pt. Primary MD and PCCM  came to see pt.

## 2019-10-08 NOTE — TOC Progression Note (Signed)
Transition of Care [facility]) - Progression Note    Patient Details  Name: LINDORA ALVIAR MRN: [id number] Date of Birth: 01-29-1946  Transition of Care [facility]) CM/SW Contact  Zenon Mayo, RN Phone Number: 10/08/2019, 5:21 PM  Clinical Narrative:    Patient is form home with family, NCM spoke with son, he states she does not have a PCP, patient is [employer] account executive. Here with AMS, DKA, Severe Sepsis, PAF (eliquis) pta, ? Meningitis, on iv abx, MRCP ordered ua postive for Ecoli.  Patient began having sob, unresponsive. Transferring to ICU.  Son is [employer] [phone].        Expected Discharge Plan and Services                                                 Social Determinants of Health (SDOH) Interventions    Readmission Risk Interventions No flowsheet data found.

## 2019-10-08 NOTE — Consult Note (Signed)
NAMESaffron Busey Andes, MRN:  [id number], DOB:  [birth year], LOS: 2 ADMISSION DATE:  10/06/2019, CONSULTATION DATE:  10/08/2019 REFERRING MD:  Dr. Maylene [name], CHIEF COMPLAINT:  Hypoxia/ AMS  Brief History   73 yo [place], non English speaking female presenting with fever and AMS found to be in DKA and sepsis.  Noted to have Klebsiella bacteremia and E. Coli ESBL UTI, and abnormal abdominal CT concerning for acute cholangitis with possible intrahepatic abscesses.  PCCM consulted 10/26 for worsening hypoxia / dyspnea, stridor and encephalopathy.   History of present illness   HPI obtained from medical chart review and per patient's son at bedside.   24 year old [place], non-English speaking female with past medical history significant for diabetes type 2 diet controlled, atrial fibrillation on Eliquis, and tachy-brady syndrome s/p pacemaker insertion who presented with altered mental status and not feeling well for three days.  Patient lives with her son.  He states she is normally independent in her ADLs and at baseline alert and oriented.  He noticed she had fever and chills for several days.   Found to be febrile and in DKA with A1c noted at 16.4, started on insulin gtt. She was admitted to [facility].  History has been limited given encephalopathy, patient cooperation, and language barrier.  CT head was negative.  Lumbar puncture defered given unclear last dose Eliquis.  Given concern for sepsis, she was cultured and started on empiric antibiotics with meningeal coverage- vancomycin, ampicillin, and ceftriaxone.  Additionally was in Afib with RVR with rates controlled on cardizem.  Blood culture noted for Klebsiella bacteremia and ESBL E. Coli UTI, and CXR concerning for multifocal pneumonia.  Given her increasing transaminitis, CT abdomen and pelvis revealed diffuse gallbladder edema, pericholecystic fluid with concern for intrahepatic abscess and concern for cholangitis.  GI consulted with recommendations for  MRCP.  Based on susceptibilities, she was changed to [facility] 10/26.  Since admission, she has been transitioned off her insulin and cardizem gtt.  On the afternoon of 10/26, she was noted have worsening dyspnea and desaturation on room air into the 70's with hypoxia and stridor with deteriorating mental status and some mottling noted.  Rapid response at bedside.  She was treated with solumedrol and racemic epi and placed on NRB.  Lactic, CXR and ABG ordered.  PCCM consulted for possible transfer to ICU given decompensation.   Past Medical History  diabetes type 2 diet controlled, atrial fibrillation on Eliquis, and tachy-brady syndrome s/p pacemaker insertion 2018  [facility]   10/24 Admit 10/26 GI and PCCM consult  Consults:  GI PCCM   Procedures:   Significant Diagnostic Tests:  10/24 CTH > Brain parenchyma appears unremarkable. No evident acute infarct. No mass or hemorrhage.  There are foci of arterial vascular calcification. There is mucosal thickening in several ethmoid air cells.  10/26 CTA A/P >> 1. Small bilateral pleural effusions and findings of multifocal pneumonia. Clinical correlation is recommended. 2. Diffuse gallbladder wall edema and pericholecystic fluid.  Additionally there is periportal edema and findings concerning for developing intrahepatic abscesses. Findings may represent cholangitis. Clinical correlation and further evaluation with MRCP is recommended. 3. Underdistention of the colon versus less likely mild colitis.  Clinical correlation is recommended. No bowel obstruction. Normal appendix. Aortic Atherosclerosis  Micro Data:  10/24 SARS CoV2 >> neg 10/24 UC >> e. Coli ESBL, staph epidermidis 10/24 BCx 2 >> klebsiella pna  Antimicrobials:  10/24 acyclovir 10/24 ampicillin >> 10/25 10/24 ceftriaxone >> 10/25 10/24  vancomycin >> 10/25 10/26 merrem >>  Interim history/subjective:  Rapid response at bedside.   Objective   Blood  pressure (!) 143/72, pulse 80, temperature 98.4 F (36.9 C), temperature source Axillary, resp. rate (!) 27, height 5' 1" (1.549 m), weight 47.7 kg, SpO2 98 %.        Intake/Output Summary (Last 24 hours) at 10/08/2019 1556 Last data filed at 10/08/2019 0935 Gross per 24 hour  Intake 944.1 ml  Output 300 ml  Net 644.1 ml   Filed Weights   10/06/19 1503 10/07/19 0142 10/08/19 0641  Weight: 54.4 kg 45.2 kg 47.7 kg    Examination: General: Thin, Well-developed, well nourished, [place] lady. Appears acutely ill HENT: Normocephalic, PERRL. Moist mucus membranes Neck: No JVD. Trachea midline. No thyromegaly, no lymphadenopathy CV: RRR. S1S2. No MRG. +2 distal pulses Lungs: BBS present, faint, mild exp tracheal stridor, faint B/L rhonchi, no wheezing or rales, Tachypneic, + upper accessory muscle usage,  symmetrical ABD: +BS x4. Soft, tender to RUQ, ND. No masses, guarding or rigidity GU: No Foley EXT: MAE well. No edema Skin: Pale, warm, dry. In tact. No rashes or lesions. No mottling noted  Neuro: withdraws from noxious stimuli, moves all EXT purposefully, does not follow commands     Resolved Hospital Problem list     Assessment & Plan:  This is a 73yo F admitted with AMS and DKA( which had resolved), A1c 14.6, found to have Klebsiella bacteremia thought to be 2/2 UTI and  cholangits now with sudden onset respiratory distress with stridor responsive to racemic epi and steroids and AMS with metabolic acidosis.   Acute respiratory distress with stridor-last medication was meropenem several hours ago, less likely but possible culprit. No urticaria or pruritis. Resp distress improved with racemic epi and solu medrol. ABG c/w metabolic acidosis.  CXR shows worsening multi focal b/l infiltrates/opacities concerning for pneumonia  Plan Transfer to ICU for close monitoring-high risk for decompensation, low threshold for intubation  Supplemental O2 for SpO2 >/=92% Cont steroids BD  Pulm hygiene   Acute metabolic encephalopathy multifactorial possibly d/t recurrent DKA vs sepsis vs metabolic acidosis. FSBS was 207. She is also at risk for septic emboli/CVA d/t bacteremia, H/O AF and off anticoagulation. PLT down to 43 Plan BMP now Beta hydroxybutyric acid  Lactic acid  UA  Continue abx  F/u Cx May need CT head pending BMP and respiratory stabilization  May need TEE   Severe sepsis 2/2/ klebsiella bacteremia, ESBL UTI, pneumonia with cholangitis also as probable source. BP currently stable. Severely elevated LFTs.   Plan Cont Abx  F/u repeat Cx IVF  Follow LFTs GI on board  MRCP pending  May need TEE at some point   PAF with h/o tachybrady syndrome on long term AC Now with Thrombocytopenia Plan Hold of AC for now SCDs only for DVT prophylaxis  Stop BB and continue flecanide for now in setting of severe sepsis monitoring VS closely     Best practice:  Diet: NPO  Pain/Anxiety/Delirium protocol (if indicated): yes VAP protocol (if indicated): NA  DVT prophylaxis: SCDs only for now  GI prophylaxis: PPI  Glucose control: SSI and lantus for now. May need IV insulin pending BMP/UA  Mobility: Bed rest  Code Status: FULL CODE Family Communication: d/w son at bedside Disposition: transfer to ICU   Labs   CBC: Recent Labs  Lab 10/06/19 1240 10/07/19 0429 10/08/19 0531  WBC 14.9* 7.3 10.4  NEUTROABS 12.1*  --   --  HGB 11.9* 10.5* 10.6*  HCT 37.1 30.4* 29.8*  MCV 97.6 89.4 87.6  PLT 101* 55* 43*    Basic Metabolic Panel: Recent Labs  Lab 10/06/19 1240 10/06/19 1633 10/06/19 2014 10/07/19 0429 10/08/19 0531  NA 133* 137 136 137 134*  K 3.3* 3.3* 3.1* 2.1* 3.4*  CL 104 111 108 110 109  CO2 9* 11* 14* 15* 14*  GLUCOSE 369* 325* 186* 95 162*  BUN _0 24*  CREATININE 1.13* 1.05* 0.89 0.73 0.76  CALCIUM 8.3* 7.7* 8.2* 8.0* 7.8*  MG  --   --   --  1.6* 2.1   GFR: Estimated Creatinine Clearance: 47.9 mL/min (by C-G formula based  on SCr of 0.76 mg/dL). Recent Labs  Lab 10/06/19 1240 10/06/19 1633 10/07/19 0429 10/07/19 0653 10/07/19 1039 10/08/19 0531  WBC 14.9*  --  7.3  --   --  10.4  LATICACIDVEN 1.7 2.4*  --  2.3* 1.5  --     Liver Function Tests: Recent Labs  Lab 10/06/19 1240 10/07/19 0429 10/08/19 0531  AST 64* 56* 2,353*  ALT 84* 59* 885*  ALKPHOS 118 99 98  BILITOT 1.5* 0.5 0.9  PROT 6.7 5.4* 5.4*  ALBUMIN 2.8* 2.0* 2.0*   No results for input(s): LIPASE, AMYLASE in the last 168 hours. No results for input(s): AMMONIA in the last 168 hours.  ABG No results found for: PHART, PCO2ART, PO2ART, HCO3, TCO2, ACIDBASEDEF, O2SAT   Coagulation Profile: Recent Labs  Lab 10/06/19 1240 10/08/19 1215  INR 1.2 1.3*    Cardiac Enzymes: No results for input(s): CKTOTAL, CKMB, CKMBINDEX, TROPONINI in the last 168 hours.  HbA1C: Hgb A1c MFr Bld  Date/Time Value Ref Range Status  10/07/2019 04:29 AM 16.4 (H) 4.8 - 5.6 % Final    Comment:    (NOTE) Pre diabetes:          5.7%-6.4% Diabetes:              >6.4% Glycemic control for   <7.0% adults with diabetes   08/29/2018 04:55 PM >15.5 (H) 4.8 - 5.6 % Final    Comment:    **Verified by repeat analysis**          Prediabetes: 5.7 - 6.4          Diabetes: >6.4          Glycemic control for adults with diabetes: <7.0     CBG: Recent Labs  Lab 10/07/19 1153 10/07/19 1643 10/07/19 2126 10/08/19 0604 10/08/19 1238  GLUCAP 299* 283* 273* 161* 126*    Review of Systems:   UTO d/t AMS  Past Medical History  She,  has a past medical history of Allergy, Diabetes mellitus type 2, diet-controlled ([place]), PAF (paroxysmal atrial fibrillation) ([place]) (09/04/2017), Sinus bradycardia (09/04/2017), and Tachy-brady syndrome ([place]) (09/04/2017).   Surgical History    Past Surgical History:  Procedure Laterality Date  . PACEMAKER IMPLANT N/A 09/05/2017   Procedure: Pacemaker Implant;  Surgeon: Deboraha Sprang, MD;  Location: [place] [facility];   Service: Cardiovascular;  Laterality: N/A;     Social History   reports that she has been smoking. She has never used smokeless tobacco. She reports that she does not drink alcohol or use drugs.   Family History   Her family history includes Diabetes in her son.   Allergies No Known Allergies   Home Medications  Prior to Admission medications   Medication Sig Start Date End Date Taking? Authorizing Provider  acetaminophen (TYLENOL) 325 MG tablet Take 1-2 tablets (325-650 mg total) by mouth every 6 (six) hours as needed for mild pain or moderate pain. 09/06/17  Yes Baldwin [place], PA-C  apixaban (ELIQUIS) 5 MG TABS tablet Take 1 tablet (5 mg total) by mouth 2 (two) times daily. 07/26/19  Yes Deboraha Sprang, MD  flecainide (TAMBOCOR) 50 MG tablet Take 1 tablet (50 mg total) by mouth 2 (two) times daily. 07/26/19  Yes Deboraha Sprang, MD  metoprolol tartrate (LOPRESSOR) 50 MG tablet Take 1 tablet (50 mg total) by mouth 2 (two) times daily. Please keep upcoming appt in September with Dr. Caryl Comes before anymore refills. Thank you 08/31/19  Yes Deboraha Sprang, MD  glucose monitoring kit (FREESTYLE) monitoring kit Check cbgs bid as instructed by pcp. 01/24/16   Shawnee Knapp, MD       The patient is critically ill with respiratory distress, encephalopathy and severe sepsis. She requires ICU for high complexity decision making, titration of high alert medications, airway monitoring and high risk for decompensation requiring ventilator management, titration of oxygen and interpretation of advanced monitoring.    I personally spent 50 minutes providing critical care services including personally reviewing test results, discussing care with nursing staff/other physicians and completing orders pertaining to this patient.  Time was exclusive to the patient and does not include time spent teaching or in procedures.  Voice recognition software was used in the production of this record.  Errors in  interpretation may have been inadvertently missed during review.  Francine Graven, MSN, AGACNP  [facility]

## 2019-10-09 ENCOUNTER — Inpatient Hospital Stay (HOSPITAL_COMMUNITY): Payer: Medicare HMO

## 2019-10-09 ENCOUNTER — Encounter (HOSPITAL_COMMUNITY): Payer: Self-pay | Admitting: General Surgery

## 2019-10-09 DIAGNOSIS — G9340 Encephalopathy, unspecified: Secondary | ICD-10-CM

## 2019-10-09 DIAGNOSIS — K750 Abscess of liver: Secondary | ICD-10-CM | POA: Diagnosis not present

## 2019-10-09 DIAGNOSIS — K769 Liver disease, unspecified: Secondary | ICD-10-CM | POA: Diagnosis not present

## 2019-10-09 DIAGNOSIS — R6520 Severe sepsis without septic shock: Secondary | ICD-10-CM

## 2019-10-09 DIAGNOSIS — A419 Sepsis, unspecified organism: Secondary | ICD-10-CM

## 2019-10-09 DIAGNOSIS — R935 Abnormal findings on diagnostic imaging of other abdominal regions, including retroperitoneum: Secondary | ICD-10-CM | POA: Diagnosis not present

## 2019-10-09 DIAGNOSIS — R932 Abnormal findings on diagnostic imaging of liver and biliary tract: Secondary | ICD-10-CM | POA: Diagnosis not present

## 2019-10-09 LAB — GLUCOSE, CAPILLARY
Glucose-Capillary: 122 mg/dL — ABNORMAL HIGH (ref 70–99)
Glucose-Capillary: 122 mg/dL — ABNORMAL HIGH (ref 70–99)
Glucose-Capillary: 107 mg/dL — ABNORMAL HIGH (ref 70–99)
Glucose-Capillary: 95 mg/dL (ref 70–99)
Glucose-Capillary: 178 mg/dL — ABNORMAL HIGH (ref 70–99)

## 2019-10-09 LAB — HEPATIC FUNCTION PANEL
ALT: 794 U/L — ABNORMAL HIGH (ref 0–44)
AST: 995 U/L — ABNORMAL HIGH (ref 15–41)
Albumin: 1.8 g/dL — ABNORMAL LOW (ref 3.5–5.0)
Alkaline Phosphatase: 109 U/L (ref 38–126)
Bilirubin, Direct: 0.3 mg/dL — ABNORMAL HIGH (ref 0.0–0.2)
Indirect Bilirubin: 0.9 mg/dL (ref 0.3–0.9)
Total Bilirubin: 1.2 mg/dL (ref 0.3–1.2)
Total Protein: 5 g/dL — ABNORMAL LOW (ref 6.5–8.1)

## 2019-10-09 LAB — BASIC METABOLIC PANEL
Anion gap: 7 (ref 5–15)
BUN: 25 mg/dL — ABNORMAL HIGH (ref 8–23)
CO2: 15 mmol/L — ABNORMAL LOW (ref 22–32)
Calcium: 7.8 mg/dL — ABNORMAL LOW (ref 8.9–10.3)
Chloride: 115 mmol/L — ABNORMAL HIGH (ref 98–111)
Creatinine, Ser: 0.68 mg/dL (ref 0.44–1.00)
GFR calc Af Amer: >60 mL/min (ref >60)
GFR calc non Af Amer: >60 mL/min (ref >60)
Glucose, Bld: 129 mg/dL — ABNORMAL HIGH (ref 70–99)
Potassium: 4 mmol/L (ref 3.5–5.1)
Sodium: 137 mmol/L (ref 135–145)

## 2019-10-09 LAB — CBC
HCT: 29.7 % — ABNORMAL LOW (ref 36.0–46.0)
Hemoglobin: 10.5 g/dL — ABNORMAL LOW (ref 12.0–15.0)
MCH: 31.2 pg (ref 26.0–34.0)
MCHC: 35.4 g/dL (ref 30.0–36.0)
MCV: 88.1 fL (ref 80.0–100.0)
Platelets: 33 10*3/uL — ABNORMAL LOW (ref 150–400)
RBC: 3.37 MIL/uL — ABNORMAL LOW (ref 3.87–5.11)
RDW: 12.9 % (ref 11.5–15.5)
WBC: 12.9 10*3/uL — ABNORMAL HIGH (ref 4.0–10.5)
nRBC: 0.2 % (ref 0.0–0.2)

## 2019-10-09 LAB — LACTIC ACID, PLASMA: Lactic Acid, Venous: 1.7 mmol/L (ref 0.5–1.9)

## 2019-10-09 LAB — HEPATITIS A ANTIBODY, TOTAL: hep A Total Ab: REACTIVE — AB

## 2019-10-09 LAB — CK: Total CK: 41 U/L (ref 38–234)

## 2019-10-09 LAB — PROTIME-INR
INR: 1.3 — ABNORMAL HIGH (ref 0.8–1.2)
Prothrombin Time: 16.4 seconds — ABNORMAL HIGH (ref 11.4–15.2)

## 2019-10-09 LAB — HEPATITIS B CORE ANTIBODY, TOTAL: Hep B Core Total Ab: REACTIVE — AB

## 2019-10-09 LAB — CULTURE, BLOOD (ROUTINE X 2)

## 2019-10-09 LAB — MRSA PCR SCREENING: MRSA by PCR: NEGATIVE

## 2019-10-09 LAB — MAGNESIUM: Magnesium: 1.9 mg/dL (ref 1.7–2.4)

## 2019-10-09 MED ORDER — VITAMIN K1 10 MG/ML IJ SOLN
5.0000 mg | Freq: Once | INTRAVENOUS | Status: AC
  Administered 2019-10-09: 5 mg via INTRAVENOUS
  Filled 2019-10-09: qty 0.5

## 2019-10-09 MED ORDER — SODIUM CHLORIDE 0.9 % IV BOLUS
250.0000 mL | Freq: Once | INTRAVENOUS | Status: AC
  Administered 2019-10-09: 250 mL via INTRAVENOUS

## 2019-10-09 MED ORDER — TECHNETIUM TC 99M MEBROFENIN IV KIT
5.3000 | PACK | Freq: Once | INTRAVENOUS | Status: AC | PRN
  Administered 2019-10-09: 5.3 via INTRAVENOUS

## 2019-10-09 NOTE — Progress Notes (Signed)
Daily Rounding Note  10/09/2019, 10:25 AM  LOS: 3 days   SUBJECTIVE:   Chief complaint: Cholangitis, biliary sepsis.  Currently off pressors.  Good sats on room air. MRCP planned today around 2 PM coordinated with pacemaker rep. Surgery has seen patient but there is no note written yet.  Apparently they are planning to sign off and recommend IR consult for percutaneous cholecystostomy tube. IR consult pending. Lactic acid improved. Clinically patient feels better today.  She complains of mid abdominal pain only if she sits up, it is absent when she lays flat.  She is hungry.  There is no nausea.  Her breathing is back to baseline, nonproblematic.  OBJECTIVE:         Vital signs in last 24 hours:    Temp:  [96.7 F (35.9 C)-98.4 F (36.9 C)] 97.8 F (36.6 C) (10/27 0820) Pulse Rate:  [59-96] 60 (10/27 1000) Resp:  [17-34] 19 (10/27 1000) BP: (78-143)/(45-112) 99/57 (10/27 1000) SpO2:  [85 %-100 %] 98 % (10/27 1000) Last BM Date: 10/08/19 Filed Weights   10/06/19 1503 10/07/19 0142 10/08/19 0641  Weight: 54.4 kg 45.2 kg 47.7 kg   General: Looks better, more alert.  Comfortable.  Not toxic appearing. Heart: RRR. Chest: Clear bilaterally.  No labored breathing. Abdomen: Soft.  Minor mid abdominal tenderness without guarding or rebound.  Active bowel sounds.  No distention Extremities: Minor, nonpitting pedal edema. Neuro/Psych: Alert.  Cooperative.  Follows all commands.  Moves all 4 limbs, no tremors.  Intake/Output from previous day: 10/26 0701 - 10/27 0700 In: 1532.5 [I.V.:1332.5; IV Piggyback:200.1] Out: -   Intake/Output this shift: Total I/O In: 597.8 [I.V.:294.6; IV Piggyback:303.2] Out: -   Lab Results: Recent Labs    10/07/19 0429 10/08/19 0531 10/09/19 0459  WBC 7.3 10.4 12.9*  HGB 10.5* 10.6* 10.5*  HCT 30.4* 29.8* 29.7*  PLT 55* 43* 33*   BMET Recent Labs    10/08/19 0531 10/08/19 1603  10/09/19 0459  NA 134* 135 137  K 3.4* 3.7 4.0  CL 109 111 115*  CO2 14* 12* 15*  GLUCOSE 162* 205* 129*  BUN 24* 23 25*  CREATININE 0.76 0.83 0.68  CALCIUM 7.8* 7.9* 7.8*   LFT Recent Labs    10/07/19 0429 10/08/19 0531 10/09/19 0459  PROT 5.4* 5.4* 5.0*  ALBUMIN 2.0* 2.0* 1.8*  AST 56* 2,353* 995*  ALT 59* 885* 794*  ALKPHOS 99 98 109  BILITOT 0.5 0.9 1.2  BILIDIR <0.1 0.2 0.3*  IBILI NOT CALCULATED 0.7 0.9   PT/INR Recent Labs    10/06/19 1240 10/08/19 1215  LABPROT 15.3* 16.0*  INR 1.2 1.3*   Hepatitis Panel Recent Labs    10/08/19 1215  HEPBSAG NON REACTIVE  HCVAB NON REACTIVE  HEPAIGM NON REACTIVE  HEPBIGM NON REACTIVE    Studies/Results: Ct Abdomen Pelvis W Contrast  Result Date: 10/08/2019 CLINICAL DATA:  73 year old female with bacteremia. EXAM: CT ABDOMEN AND PELVIS WITH CONTRAST TECHNIQUE: Multidetector CT imaging of the abdomen and pelvis was performed using the standard protocol following bolus administration of intravenous contrast. CONTRAST:  100mL OMNIPAQUE IOHEXOL 300 MG/ML  SOLN COMPARISON:  None. FINDINGS: Evaluation of this exam is limited due to respiratory motion artifact. Lower chest: Partially visualized small bilateral pleural effusions with associated partial compressive atelectasis of the lower lobes. Multiple patchy areas of airspace consolidation in the visualized lung fields most consistent with multifocal pneumonia. Several peripheral and subpleural airspace opacity  may have central cavitation (series 3, image 1) and may represent fungal infection, abscesses, or septic emboli. Clinical correlation is recommended. Cardiac pacemaker wires noted. No intra-abdominal free air. There is a small free fluid within the pelvis as well as a small perihepatic free fluid. Hepatobiliary: There is heterogeneous enhancement of the liver. Two adjacent hypoenhancing areas noted in the right lobe of the liver each measuring approximately 2.4 x 2.4 cm  concerning for areas of infection or developing abscesses. There is mild periportal edema. There is diffuse thickening of the gallbladder wall with pericholecystic fluid. No definite calcified stone identified. Evaluation of the gallbladder is very limited due to respiratory motion artifact. Sludge or noncalcified stone may be present within the gallbladder. Further evaluation with ultrasound or MRCP is recommended. Pancreas: Unremarkable. No pancreatic ductal dilatation or surrounding inflammatory changes. Spleen: Normal in size without focal abnormality. Adrenals/Urinary Tract: The adrenal glands are unremarkable. There is no hydronephrosis on either side. There is symmetric enhancement and excretion of contrast by both kidneys. The visualized ureters and urinary bladder appear unremarkable. Stomach/Bowel: There is no bowel obstruction. Mild diffuse thickened appearance of the colonic wall may be related to underdistention. Clinical correlation is recommended to evaluate for mild colitis. The appendix is normal. Vascular/Lymphatic: Mild aortoiliac atherosclerotic disease. The IVC is unremarkable. No portal venous gas. Mildly enlarged periportal lymph nodes, reactive. Reproductive: The uterus is grossly unremarkable. No adnexal masses. Other: There is diffuse subcutaneous edema. No fluid collection. Musculoskeletal: Degenerative changes of the spine. No acute osseous pathology. IMPRESSION: 1. Small bilateral pleural effusions and findings of multifocal pneumonia. Clinical correlation is recommended. 2. Diffuse gallbladder wall edema and pericholecystic fluid. Additionally there is periportal edema and findings concerning for developing intrahepatic abscesses. Findings may represent cholangitis. Clinical correlation and further evaluation with MRCP is recommended. 3. Underdistention of the colon versus less likely mild colitis. Clinical correlation is recommended. No bowel obstruction. Normal appendix. Aortic  Atherosclerosis (ICD10-I70.0). Electronically Signed   By: Anner Crete M.D.   On: 10/08/2019 00:18   US Abdomen Limited  Result Date: 10/08/2019 CLINICAL DATA:  Ascending cholangitis.  Abnormal CT. EXAM: ULTRASOUND ABDOMEN LIMITED RIGHT UPPER QUADRANT COMPARISON:  None. FINDINGS: Gallbladder: There is gallbladder wall thickening with the gallbladder measuring approximately 3-4 mm. There is pericholecystic free fluid. Gallbladder sludge is noted. The sonographic Percell Miller sign was not reliably evaluated secondary to patient condition. Common bile duct: Diameter: 3 mm Liver: There is a 5 x 2.5 x 4.8 cm collection in the right hepatic lobe. This collection demonstrates no significant internal color Doppler flow. Portal vein is patent on color Doppler imaging with normal direction of blood flow towards the liver. Other: There is a right-sided pleural effusion. IMPRESSION: 1. Gallbladder sludge with pericholecystic free fluid and gallbladder wall thickening. Findings could represent acute cholecystitis in the appropriate clinical setting. The sonographic Percell Miller sign could not be reliably assessed. Further evaluation can be performed with HIDA scan as clinically indicated. 2. Complex collection in the right hepatic lobe as detailed above, again concerning for developing intrahepatic abscess in the appropriate clinical setting. 3. Incidentally noted right-sided pleural effusion. Electronically Signed   By: Constance Holster M.D.   On: 10/08/2019 21:02   Dg Chest Port 1 View  Result Date: 10/08/2019 CLINICAL DATA:  Hypoxia and shortness of breath. EXAM: PORTABLE CHEST 1 VIEW COMPARISON:  10/07/2019 and 10/06/2019 FINDINGS: Left-sided pacemaker unchanged. Lungs are adequately inflated demonstrate continued mild patchy bilateral airspace process with slight worsening over the left midlung and  left upper lobe. No effusion. Cardiomediastinal silhouette and remainder of the exam is unchanged. IMPRESSION: Slight  interval worsening bilateral patchy airspace process likely multifocal infection. Electronically Signed   By: Elberta Fortis M.D.   On: 10/08/2019 16:00   Dg Chest Port 1 View  Result Date: 10/07/2019 CLINICAL DATA:  Shortness of breath EXAM: PORTABLE CHEST 1 VIEW COMPARISON:  10/06/2019 FINDINGS: The heart size and mediastinal contours are within normal limits. Left chest multi lead pacer. Increased multifocal heterogeneous opacity of the bilateral mid lungs and perihilar right lung. The visualized skeletal structures are unremarkable. IMPRESSION: Increased multifocal heterogeneous opacity of the bilateral mid lungs and perihilar right lung, concerning for multifocal infection. Electronically Signed   By: Lauralyn Primes M.D.   On: 10/07/2019 19:26    ASSESMENT:   *   Klebsiella, biliary sepsis.  Likely cholangitis. ?  Liver abscesses.  Cholecystitis. Marked rise in transaminases yesterday, improved today.  Slight increase INR at 1.3.  Acute hepatitis serologies negative.  Suspect hypotension, shock liver contributing. Rising WBCs.  Improving lactic acid. Ultrasound reveals sludge in the gallbladder, pericholecystic free fluid and gallbladder wall thickening.  HIDA recommended if confirmation of cholecystitis desired.  Complex fluid collection in right hepatic lobe concerning for intrahepatic abscess.  *     Thrombocytopenia.  Steadily declined, 30 3K today.  *    DKA.  Markedly elevated hemoglobin A1c in patient with previous history of diet-controlled diabetes.  Does not follow-up with physicians often.   PLAN   *    MRCP today.  *    Likely cholecystostomy tube placement.  If this does not happen until tomorrow.  Could begin clear liquids after completion of her MRCP and make her n.p.o. after midnight for cholecystostomy tube tomorrow.  Suspect IR will go ahead and place the cholecystostomy tube today.    Jennye Moccasin  10/09/2019, 10:25 AM Phone [phone]

## 2019-10-09 NOTE — Progress Notes (Addendum)
Brief Progress note Pt currently remains off pressors , pressure is soft Lactate last check was 2.7 on 10/26 She is + 4900 cc's She remains on RA with adequate sats  Plan Per CT results we have consulted Surgery( Dr. Dema Severin) and IR for eval. Of cholecystitis/abscess  They have assessed patient. Surgery are recommending drains per IR to both the hepatic abscess and the GB. Dr. Kathlene Cote has evaluated the patient. Plan is for Hyda scan  If the hepatic abscess is large enough he will attempt a drain / aspiration If the Hyda scan indicates need he will place the choley  drain Will recheck lactate to ensure patient has been adequately resuscitated Will give 250 cc of NS now For MR Abdomen MRCP  at 1400 today ( earliest time available to co-ordinate with pacemaker rep)  CC APP time to co-ordinate patient care 30 minutes  Courtney Spatz, MSN, AGACNP-BC [place] Pager # [phone] After 4 pm please call [phone] 10/09/2019 9:17 AM

## 2019-10-09 NOTE — Consult Note (Signed)
Courtney [name] 1946/04/12  [id number].    Requesting MD: Dr. Dessa Phi Chief Complaint/Reason for Consult: cholecystitis/liver abscess  HPI:  This is a 73 yo [place] female who does not speak English and history (still somewhat limited) obtained from her son.  Apparently last Wednesday she started to develop abdominal pain.  The son denies nausea, but states she has had a decrease in appetite.  He also denies diarrhea.  He states that she had been " hot and cold."  Her abdominal pain has persisted and she was brought to the ED and admitted on 10/24.  She has a history of DM and found to be in DKA with AMS likely secondary to underlying sepsis.  She was started on abx therapy for meningitis; however, LP unable to be done due to use of eliquis.  Further work up ultimately revealed Klebsiella pneumo.  She then underwent a CT scan of the abdomen/pelvis and [place] that revealed a developing liver abscess and pericholecystic fluid concerning for cholecystitis.  Her LFTs (AST/ALT) are elevated, but TB is normal.  She was moved to the unit last night due to hypotension and some respiratory distress issues.  These have improved today.  Given above imaging, we have been asked to see the patient for further recommendations.  ROS: ROS: unable to fully obtain due to language barrier.  Family History  Problem Relation Age of Onset   Diabetes Son     Past Medical History:  Diagnosis Date   Allergy    Diabetes mellitus type 2, diet-controlled ([place])    PAF (paroxysmal atrial fibrillation) ([place]) 09/04/2017   Sinus bradycardia 09/04/2017   HR 30s at times   Tachy-brady syndrome ([place]) 09/04/2017    Past Surgical History:  Procedure Laterality Date   PACEMAKER IMPLANT N/A 09/05/2017   Procedure: Pacemaker Implant;  Surgeon: Deboraha Sprang, MD;  Location: [facility];  Service: Cardiovascular;  Laterality: N/A;    Social History:  reports that she has been smoking. She has never used  smokeless tobacco. She reports that she does not drink alcohol or use drugs.  Allergies: No Known Allergies  Medications Prior to Admission  Medication Sig Dispense Refill   acetaminophen (TYLENOL) 325 MG tablet Take 1-2 tablets (325-650 mg total) by mouth every 6 (six) hours as needed for mild pain or moderate pain.     apixaban (ELIQUIS) 5 MG TABS tablet Take 1 tablet (5 mg total) by mouth 2 (two) times daily. 180 tablet 1   flecainide (TAMBOCOR) 50 MG tablet Take 1 tablet (50 mg total) by mouth 2 (two) times daily. 180 tablet 0   metoprolol tartrate (LOPRESSOR) 50 MG tablet Take 1 tablet (50 mg total) by mouth 2 (two) times daily. Please keep upcoming appt in September with Dr. Caryl Comes before anymore refills. Thank you 180 tablet 0   glucose monitoring kit (FREESTYLE) monitoring kit Check cbgs bid as instructed by pcp. 1 each 11     Physical Exam: Blood pressure (!) 99/57, pulse 60, temperature 97.8 F (36.6 C), temperature source Oral, resp. rate 19, height _0  (1.549 m), weight 47.7 kg, SpO2 98 %. General: pleasant, WD, WN [place] female who is laying in bed in NAD HEENT: head is normocephalic, atraumatic.  Sclera are noninjected.  PERRL.  Ears and nose without any masses or lesions.  Mouth is pink and moist Heart: regular, rate, and rhythm.  Normal s1,s2. No obvious murmurs, gallops, or rubs noted.  Palpable radial and  pedal pulses bilaterally.  Pacemaker in place in chest Lungs: CTAB, no wheezes, rhonchi, or rales noted.  Respiratory effort nonlabored Abd: soft, mildly tender in RUQ and RLQ, but greatest in RUQ, ND, +BS, no masses, hernias, or organomegaly.  No peritonitis or guarding noted MS: all 4 extremities are symmetrical with no cyanosis, clubbing, or edema. Skin: warm and dry with no masses, lesions, or rashes Psych: Alert and speaks to her son with an appropriate affect.   Results for orders placed or performed during the hospital encounter of 10/06/19 (from the past  48 hour(s))  Glucose, capillary     Status: Abnormal   Collection Time: 10/07/19 11:53 AM  Result Value Ref Range   Glucose-Capillary 299 (H) 70 - 99 mg/dL  Glucose, capillary     Status: Abnormal   Collection Time: 10/07/19  4:43 PM  Result Value Ref Range   Glucose-Capillary 283 (H) 70 - 99 mg/dL  Glucose, capillary     Status: Abnormal   Collection Time: 10/07/19  9:26 PM  Result Value Ref Range   Glucose-Capillary 273 (H) 70 - 99 mg/dL   Comment 1 Notify RN    Comment 2 Document in Chart   Basic metabolic panel     Status: Abnormal   Collection Time: 10/08/19  5:31 AM  Result Value Ref Range   Sodium 134 (L) 135 - 145 mmol/L   Potassium 3.4 (L) 3.5 - 5.1 mmol/L   Chloride 109 98 - 111 mmol/L   CO2 14 (L) 22 - 32 mmol/L   Glucose, Bld 162 (H) 70 - 99 mg/dL   BUN 24 (H) 8 - 23 mg/dL   Creatinine, Ser 0.76 0.44 - 1.00 mg/dL   Calcium 7.8 (L) 8.9 - 10.3 mg/dL   GFR calc non Af Amer >60 >60 mL/min   GFR calc Af Amer >60 >60 mL/min   Anion gap 11 5 - 15    Comment: Performed at [facility], [address] [place], [place] [address]  CBC     Status: Abnormal   Collection Time: 10/08/19  5:31 AM  Result Value Ref Range   WBC 10.4 4.0 - 10.5 K/uL   RBC 3.40 (L) 3.87 - 5.11 MIL/uL   Hemoglobin 10.6 (L) 12.0 - 15.0 g/dL   HCT 29.8 (L) 36.0 - 46.0 %   MCV 87.6 80.0 - 100.0 fL   MCH 31.2 26.0 - 34.0 pg   MCHC 35.6 30.0 - 36.0 g/dL   RDW 12.6 11.5 - 15.5 %   Platelets 43 (L) 150 - 400 K/uL    Comment: Immature Platelet Fraction may be clinically indicated, consider ordering this additional test [id number]    nRBC 0.2 0.0 - 0.2 %    Comment: Performed at [facility], [place] [address], [place], [place] [address]  Hepatic function panel     Status: Abnormal   Collection Time: 10/08/19  5:31 AM  Result Value Ref Range   Total Protein 5.4 (L) 6.5 - 8.1 g/dL   Albumin 2.0 (L) 3.5 - 5.0 g/dL   AST 2,353 (H) 15 - 41 U/L    Comment: RESULTS CONFIRMED BY MANUAL DILUTION     ALT 885 (H) 0 - 44 U/L   Alkaline Phosphatase 98 38 - 126 U/L   Total Bilirubin 0.9 0.3 - 1.2 mg/dL   Bilirubin, Direct 0.2 0.0 - 0.2 mg/dL   Indirect Bilirubin 0.7 0.3 - 0.9 mg/dL    Comment: Performed at [facility], [place] [address] [place], [place] [address]  Magnesium     Status: None   Collection Time: 10/08/19  5:31 AM  Result Value Ref Range   Magnesium 2.1 1.7 - 2.4 mg/dL    Comment: Performed at [facility] [address], [place], [place] [address]  Glucose, capillary     Status: Abnormal   Collection Time: 10/08/19  6:04 AM  Result Value Ref Range   Glucose-Capillary 161 (H) 70 - 99 mg/dL  Hepatitis panel, acute     Status: None   Collection Time: 10/08/19 12:15 PM  Result Value Ref Range   Hepatitis B Surface Ag NON REACTIVE NON REACTIVE   HCV Ab NON REACTIVE NON REACTIVE    Comment: (NOTE) Nonreactive HCV antibody screen is consistent with no HCV infections,  unless recent infection is suspected or other evidence exists to indicate HCV infection.    Hep A IgM NON REACTIVE NON REACTIVE   Hep B C IgM NON REACTIVE NON REACTIVE    Comment: Performed at [facility], [place] [address] [place], [place] [address]  Protime-INR Once     Status: Abnormal   Collection Time: 10/08/19 12:15 PM  Result Value Ref Range   Prothrombin Time 16.0 (H) 11.4 - 15.2 seconds   INR 1.3 (H) 0.8 - 1.2    Comment: (NOTE) INR goal varies based on device and disease states. Performed at [facility], [place] [address], [place], [place] [address]   Glucose, capillary     Status: Abnormal   Collection Time: 10/08/19 12:38 PM  Result Value Ref Range   Glucose-Capillary 126 (H) 70 - 99 mg/dL  Blood gas, arterial     Status: Abnormal   Collection Time: 10/08/19  3:33 PM  Result Value Ref Range   FIO2 100.00    pH, Arterial 7.453 (H) 7.350 - 7.450   pO2, Arterial 102 83.0 - 108.0 mmHg   Bicarbonate 12.3 (L) 20.0 - 28.0 mmol/L   Acid-base deficit 11.0 (H) 0.0 - 2.0  mmol/L   O2 Saturation 97.3 %   Patient temperature 98.6     Comment: Performed at [facility] [address], [place], [place] [address]  Glucose, capillary     Status: Abnormal   Collection Time: 10/08/19  3:46 PM  Result Value Ref Range   Glucose-Capillary 207 (H) 70 - 99 mg/dL  TSH     Status: None   Collection Time: 10/08/19  4:03 PM  Result Value Ref Range   TSH 0.894 0.350 - 4.500 uIU/mL    Comment: Performed by a 3rd Generation assay with a functional sensitivity of <=0.01 uIU/mL. Performed at [facility], [place] [address] [place], [place] [address]   Beta-hydroxybutyric acid     Status: Abnormal   Collection Time: 10/08/19  4:03 PM  Result Value Ref Range   Beta-Hydroxybutyric Acid 1.05 (H) 0.05 - 0.27 mmol/L    Comment: Performed at [facility] [address] [place],  [address]  Basic metabolic panel     Status: Abnormal   Collection Time: 10/08/19  4:03 PM  Result Value Ref Range   Sodium 135 135 - 145 mmol/L   Potassium 3.7 3.5 - 5.1 mmol/L   Chloride 111 98 - 111 mmol/L   CO2 12 (L) 22 - 32 mmol/L   Glucose, Bld 205 (H) 70 - 99 mg/dL   BUN 23 8 - 23 mg/dL  Creatinine, Ser 0.83 0.44 - 1.00 mg/dL   Calcium 7.9 (L) 8.9 - 10.3 mg/dL   GFR calc non Af Amer >60 >60 mL/min   GFR calc Af Amer >60 >60 mL/min   Anion gap 12 5 - 15    Comment: Performed at [facility] [address] [place], [place] [address]  Magnesium     Status: None   Collection Time: 10/08/19  4:03 PM  Result Value Ref Range   Magnesium 1.8 1.7 - 2.4 mg/dL    Comment: Performed at [facility] [address], [place], [place] [address]  Lactic acid, plasma     Status: Abnormal   Collection Time: 10/08/19  4:11 PM  Result Value Ref Range   Lactic Acid, Venous 4.3 (HH) 0.5 - 1.9 mmol/L    Comment: CRITICAL RESULT CALLED TO, READ BACK BY AND VERIFIED WITH:  Floyce Stakes 1659 10/08/2019 WBOND Performed at [facility], [place] [address]  [place], [place] [address]   Glucose, capillary     Status: Abnormal   Collection Time: 10/08/19  5:44 PM  Result Value Ref Range   Glucose-Capillary 232 (H) 70 - 99 mg/dL  Glucose, capillary     Status: Abnormal   Collection Time: 10/08/19  6:39 PM  Result Value Ref Range   Glucose-Capillary 200 (H) 70 - 99 mg/dL  Glucose, capillary     Status: Abnormal   Collection Time: 10/08/19  7:43 PM  Result Value Ref Range   Glucose-Capillary 173 (H) 70 - 99 mg/dL  Lactic acid, plasma     Status: Abnormal   Collection Time: 10/08/19 10:36 PM  Result Value Ref Range   Lactic Acid, Venous 2.7 (HH) 0.5 - 1.9 mmol/L    Comment: CRITICAL VALUE NOTED.  VALUE IS CONSISTENT WITH PREVIOUSLY REPORTED AND CALLED VALUE. Performed at [facility], [place] [address] [place],  [address]   MRSA PCR Screening     Status: None   Collection Time: 10/08/19 10:50 PM   Specimen: Nasal Mucosa; Nasopharyngeal  Result Value Ref Range   MRSA by PCR NEGATIVE NEGATIVE    Comment:        The GeneXpert MRSA Assay (FDA approved for NASAL specimens only), is one component of a comprehensive MRSA colonization surveillance program. It is not intended to diagnose MRSA infection nor to guide or monitor treatment for MRSA infections. Performed at [facility], [place] [address], [place], [place] [address]   Glucose, capillary     Status: Abnormal   Collection Time: 10/08/19 11:31 PM  Result Value Ref Range   Glucose-Capillary 108 (H) 70 - 99 mg/dL  Glucose, capillary     Status: Abnormal   Collection Time: 10/09/19  4:27 AM  Result Value Ref Range   Glucose-Capillary 122 (H) 70 - 99 mg/dL  Basic metabolic panel     Status: Abnormal   Collection Time: 10/09/19  4:59 AM  Result Value Ref Range   Sodium 137 135 - 145 mmol/L   Potassium 4.0 3.5 - 5.1 mmol/L   Chloride 115 (H) 98 - 111 mmol/L   CO2 15 (L) 22 - 32 mmol/L   Glucose, Bld 129 (H) 70 - 99 mg/dL   BUN 25 (H) 8 - 23 mg/dL   Creatinine, Ser 0.68  0.44 - 1.00 mg/dL   Calcium 7.8 (L) 8.9 - 10.3 mg/dL   GFR calc non Af Amer >60 >60 mL/min   GFR calc Af  Amer >60 >60 mL/min   Anion gap 7 5 - 15    Comment: Performed at [facility] [address], [place], [place] [address]  CBC     Status: Abnormal   Collection Time: 10/09/19  4:59 AM  Result Value Ref Range   WBC 12.9 (H) 4.0 - 10.5 K/uL   RBC 3.37 (L) 3.87 - 5.11 MIL/uL   Hemoglobin 10.5 (L) 12.0 - 15.0 g/dL   HCT 29.7 (L) 36.0 - 46.0 %   MCV 88.1 80.0 - 100.0 fL   MCH 31.2 26.0 - 34.0 pg   MCHC 35.4 30.0 - 36.0 g/dL   RDW 12.9 11.5 - 15.5 %   Platelets 33 (L) 150 - 400 K/uL    Comment: REPEATED TO VERIFY Immature Platelet Fraction may be clinically indicated, consider ordering this additional test [id number] CONSISTENT WITH PREVIOUS RESULT    nRBC 0.2 0.0 - 0.2 %    Comment: Performed at [facility], [place] [address], [place], [place] [address]  Hepatic function panel     Status: Abnormal   Collection Time: 10/09/19  4:59 AM  Result Value Ref Range   Total Protein 5.0 (L) 6.5 - 8.1 g/dL   Albumin 1.8 (L) 3.5 - 5.0 g/dL   AST 995 (H) 15 - 41 U/L   ALT 794 (H) 0 - 44 U/L   Alkaline Phosphatase 109 38 - 126 U/L   Total Bilirubin 1.2 0.3 - 1.2 mg/dL   Bilirubin, Direct 0.3 (H) 0.0 - 0.2 mg/dL   Indirect Bilirubin 0.9 0.3 - 0.9 mg/dL    Comment: Performed at [facility] [address], [place], [place] [address]  Magnesium     Status: None   Collection Time: 10/09/19  4:59 AM  Result Value Ref Range   Magnesium 1.9 1.7 - 2.4 mg/dL    Comment: Performed at [facility] [address] [place], [place] [address]  CK     Status: None   Collection Time: 10/09/19  6:33 AM  Result Value Ref Range   Total CK 41 38 - 234 U/L    Comment: Performed at [facility], [place] [address] [place], [place] [address]  Glucose, capillary     Status: Abnormal   Collection Time: 10/09/19  8:19 AM  Result Value Ref Range   Glucose-Capillary 122 (H) 70 - 99  mg/dL   Ct Abdomen Pelvis W Contrast  Result Date: 10/08/2019 CLINICAL DATA:  73 year old female with bacteremia. EXAM: CT ABDOMEN AND PELVIS WITH CONTRAST TECHNIQUE: Multidetector CT imaging of the abdomen and pelvis was performed using the standard protocol following bolus administration of intravenous contrast. CONTRAST:  190m OMNIPAQUE IOHEXOL 300 MG/ML  SOLN COMPARISON:  None. FINDINGS: Evaluation of this exam is limited due to respiratory motion artifact. Lower chest: Partially visualized small bilateral pleural effusions with associated partial compressive atelectasis of the lower lobes. Multiple patchy areas of airspace consolidation in the visualized lung fields most consistent with multifocal pneumonia. Several peripheral and subpleural airspace opacity may have central cavitation (series 3, image 1) and may represent fungal infection, abscesses, or septic emboli. Clinical correlation is recommended. Cardiac pacemaker wires noted. No intra-abdominal free air. There is a small free fluid within the pelvis as well as a small perihepatic free fluid. Hepatobiliary: There is heterogeneous enhancement of the liver. Two adjacent hypoenhancing areas noted in the right lobe of the liver each measuring approximately 2.4 x 2.4 cm concerning for  areas of infection or developing abscesses. There is mild periportal edema. There is diffuse thickening of the gallbladder wall with pericholecystic fluid. No definite calcified stone identified. Evaluation of the gallbladder is very limited due to respiratory motion artifact. Sludge or noncalcified stone may be present within the gallbladder. Further evaluation with ultrasound or MRCP is recommended. Pancreas: Unremarkable. No pancreatic ductal dilatation or surrounding inflammatory changes. Spleen: Normal in size without focal abnormality. Adrenals/Urinary Tract: The adrenal glands are unremarkable. There is no hydronephrosis on either side. There is symmetric  enhancement and excretion of contrast by both kidneys. The visualized ureters and urinary bladder appear unremarkable. Stomach/Bowel: There is no bowel obstruction. Mild diffuse thickened appearance of the colonic wall may be related to underdistention. Clinical correlation is recommended to evaluate for mild colitis. The appendix is normal. Vascular/Lymphatic: Mild aortoiliac atherosclerotic disease. The IVC is unremarkable. No portal venous gas. Mildly enlarged periportal lymph nodes, reactive. Reproductive: The uterus is grossly unremarkable. No adnexal masses. Other: There is diffuse subcutaneous edema. No fluid collection. Musculoskeletal: Degenerative changes of the spine. No acute osseous pathology. IMPRESSION: 1. Small bilateral pleural effusions and findings of multifocal pneumonia. Clinical correlation is recommended. 2. Diffuse gallbladder wall edema and pericholecystic fluid. Additionally there is periportal edema and findings concerning for developing intrahepatic abscesses. Findings may represent cholangitis. Clinical correlation and further evaluation with MRCP is recommended. 3. Underdistention of the colon versus less likely mild colitis. Clinical correlation is recommended. No bowel obstruction. Normal appendix. Aortic Atherosclerosis (ICD10-I70.0). Electronically Signed   By: Anner Crete M.D.   On: 10/08/2019 00:18   US Abdomen Limited  Result Date: 10/08/2019 CLINICAL DATA:  Ascending cholangitis.  Abnormal CT. EXAM: ULTRASOUND ABDOMEN LIMITED RIGHT UPPER QUADRANT COMPARISON:  None. FINDINGS: Gallbladder: There is gallbladder wall thickening with the gallbladder measuring approximately 3-4 mm. There is pericholecystic free fluid. Gallbladder sludge is noted. The sonographic Percell Miller sign was not reliably evaluated secondary to patient condition. Common bile duct: Diameter: 3 mm Liver: There is a 5 x 2.5 x 4.8 cm collection in the right hepatic lobe. This collection demonstrates no  significant internal color Doppler flow. Portal vein is patent on color Doppler imaging with normal direction of blood flow towards the liver. Other: There is a right-sided pleural effusion. IMPRESSION: 1. Gallbladder sludge with pericholecystic free fluid and gallbladder wall thickening. Findings could represent acute cholecystitis in the appropriate clinical setting. The sonographic Percell Miller sign could not be reliably assessed. Further evaluation can be performed with HIDA scan as clinically indicated. 2. Complex collection in the right hepatic lobe as detailed above, again concerning for developing intrahepatic abscess in the appropriate clinical setting. 3. Incidentally noted right-sided pleural effusion. Electronically Signed   By: Constance Holster M.D.   On: 10/08/2019 21:02   Dg Chest Port 1 View  Result Date: 10/08/2019 CLINICAL DATA:  Hypoxia and shortness of breath. EXAM: PORTABLE CHEST 1 VIEW COMPARISON:  10/07/2019 and 10/06/2019 FINDINGS: Left-sided pacemaker unchanged. Lungs are adequately inflated demonstrate continued mild patchy bilateral airspace process with slight worsening over the left midlung and left upper lobe. No effusion. Cardiomediastinal silhouette and remainder of the exam is unchanged. IMPRESSION: Slight interval worsening bilateral patchy airspace process likely multifocal infection. Electronically Signed   By: Marin Olp M.D.   On: 10/08/2019 16:00   Dg Chest Port 1 View  Result Date: 10/07/2019 CLINICAL DATA:  Shortness of breath EXAM: PORTABLE CHEST 1 VIEW COMPARISON:  10/06/2019 FINDINGS: The heart size and mediastinal contours are within normal limits.  Left chest multi lead pacer. Increased multifocal heterogeneous opacity of the bilateral mid lungs and perihilar right lung. The visualized skeletal structures are unremarkable. IMPRESSION: Increased multifocal heterogeneous opacity of the bilateral mid lungs and perihilar right lung, concerning for multifocal  infection. Electronically Signed   By: Eddie Candle M.D.   On: 10/07/2019 19:26      Assessment/Plan DM AFib with tachybrady syndrome and a pacemaker, on eliquis Klebsiella pneumoniae bacteremia  Liver abscess with cholecystitis The patient appears to have a developing liver abscess with an etiology possible of the gallbladder from cholecystitis.  She has been evaluated by IR who will try to place a drain in the liver if it is big enough.  They want a HIDA scan first to confirm cholecystitis prior to place a perc chole drain.  GI is also on board and wants an MRCP due to elevation of her LFTs and possibility of cholangitis.  She is on abx therapy.  Hepatitis panel is also pending.  We will continue to follow.   FEN - NPO for procedures VTE - SCDs ID - [place], [facility] 10/09/2019, 10:57 AM Please see Amion for pager number during day hours 7:00am-4:30pm

## 2019-10-09 NOTE — Progress Notes (Signed)
Pt had an MRCP ordered, pt has a pacemaker.  While the pacemaker and the leads are 'conditional' in other countries, it is not yet FDA approved in the [place] yet, so the patient can not be scanned in the [place].  Floor will be called.

## 2019-10-09 NOTE — Progress Notes (Signed)
Initial Nutrition Assessment  DOCUMENTATION CODES:   Not applicable  INTERVENTION:   Diet advancement as tolerated; plan to add appropriate oral nutrition supplementation once diet advanced   NUTRITION DIAGNOSIS:   Inadequate oral intake related to acute illness, poor appetite, nausea, altered GI function as evidenced by NPO status, per patient/family report.  GOAL:   Patient will meet greater than or equal to 90% of their needs  MONITOR:   PO intake, Supplement acceptance, Diet advancement, Labs, Weight trends  REASON FOR ASSESSMENT:   Malnutrition Screening Tool    ASSESSMENT:   73 yo female admitted with DKA, AMS, sepsis with bacteremia, acute cholangitis with possible intrahepatic abscess. PMH includes DM, PAF, pacemaker placement. Pt speaks [place]  10/26 Rapid Response  Plan for HIDA scan, possible ERCP, cholecystostomy tube placement  NPO, pt is hungry, no nausea, + abd pain when sits up  Unable to obtain diet and weight history from pt at this time  Lab Results  Component Value Date   HGBA1C 16.4 (H) 10/07/2019    Labs: reviewed; CBGs 107-122 Meds: LR at 100 ml/hr, ss novolog, lantus  NUTRITION - FOCUSED PHYSICAL EXAM:  Pt not available  Diet Order:   Diet Order            Diet NPO time specified Except for: Sips with Meds  Diet effective now              EDUCATION NEEDS:   Not appropriate for education at this time  Skin:  Skin Assessment: Reviewed RN Assessment  Last BM:  10/26  Height:   Ht Readings from Last 1 Encounters:  10/07/19 5\' 1"  (1.549 m)    Weight:   Wt Readings from Last 1 Encounters:  10/08/19 47.7 kg    Ideal Body Weight:  47.7 kg  BMI:  Body mass index is 19.87 kg/m.  Estimated Nutritional Needs:   Kcal:  1600-1800 kcals  Protein:  80-90 g  Fluid:  >/= 1.6 L   Cate Elby Blackwelder MS, RDN, LDN, CNSC [phone] Pager  [phone] Weekend/On-Call Pager

## 2019-10-09 NOTE — Progress Notes (Signed)
PROGRESS NOTE    Courtney [name]  UDJ:[id number] DOB: May 15, 1946 DOA: 10/06/2019 PCP: Patient, No Pcp Per     Brief Narrative:  Courtney [name] is a [place] speaking 73 year old female with past medical history significant for type 2 diabetes, atrial fibrillation, tachybradycardia syndrome who was brought into the emergency department with chief complaint of altered mental status.  In the emergency department, there was concern for meningitis and patient was started on vancomycin, ampicillin, ceftriaxone.  LP not pursued in the emergency department as patient was on Eliquis and was unclear when her last dose was.  She was also found in DKA and was started on IV insulin.  During her hospitalization, she was found to have Klebsiella bacteremia, ESBL UTI, multifocal pneumonia.  Her mentation continued to improve and as she had source for her sepsis and altered mental status, LP was canceled.  On 10/26, rapid response was called due to acute respiratory distress.  Patient was transferred to the ICU with PCCM consulted.  New events last 24 hours / Subjective: Patient with improvement overnight, currently on room air without any respiratory distress.  Patient is evaluated with audio interpreter at bedside.  Patient denies any abdominal pain or shortness of breath this morning.  Assessment & Plan:   Active Problems:   Altered mental status   AMS (altered mental status)   Acute metabolic encephalopathy -Multifactorial in setting of DKA as well as underlying sepsis -Returned back to baseline   Severe sepsis secondary to Klebsiella bacteremia, ESBL UTI, multifocal pneumonia, concern for cholangitis, liver abscesses  -At the time of admission there was concern for meningitis due to patient's fever, altered mental status, headache.  LP was not pursued at admission due to patient being on Eliquis.  Son confirms that patient's last Eliquis dose was 2 days ago.  LP not further pursued as patient has other source of  infection to explain her presenting symptoms.  Meningitis is low on the differential. -Blood culture returned with Klebsiella -CT abdomen pelvis revealed diffuse gallbladder edema, pericholecystic fluid, concern for intrahepatic abscesses, concern for cholangitis  -MRCP unable to be completed due to pacemaker leads -Urine culture returned with ESBL EColi and staph epidermidis --> antibiotics changed to [place] -GI following  -General surgery following  -IR consulted for cholecystostomy tube placement, liver abscess aspiration vs drain placement  -HIDA scan ordered  -IVF, NPO   Acute hypoxemic respiratory failure -Required nonrebreather on 10/26 -Currently on room air -PCCM following   Elevated liver enzymes -Improved overnight, trend   Paroxysmal atrial fibrillation with RVR -Patient was started on Cardizem drip at the time of admission -Currently in normal sinus rhythm with rate control  Tachybradycardia syndrome -Pacemaker placed 2018 -Continue flecainide, metoprolol  DKA -Hemoglobin A1c 16.4 -Patient required IV insulin during hospitalization and has now been transitioned to Lantus and sliding scale insulin   DVT prophylaxis: SCD (Eliquis PTA) Code Status: Full code Family Communication: Spoke with son over the phone  Disposition Plan: Pending further work-up, clinical improvement    Consultants:   GI  PCCM  GI  IR  Procedures:   None  Antimicrobials:  Anti-infectives (From admission, onward)   Start     Dose/Rate Route Frequency Ordered Stop   10/08/19 1000  meropenem (MERREM) 1 g in sodium chloride 0.9 % 100 mL IVPB     1 g 200 mL/hr over 30 Minutes Intravenous Every 12 hours 10/08/19 0848     10/07/19 1700  vancomycin (VANCOCIN) IVPB 750 mg/150 ml  premix  Status:  Discontinued     750 mg 150 mL/hr over 60 Minutes Intravenous Every 24 hours 10/06/19 1608 10/07/19 0427   10/07/19 1700  vancomycin (VANCOCIN) IVPB 750 mg/150 ml premix  Status:   Discontinued     750 mg 150 mL/hr over 60 Minutes Intravenous Every 24 hours 10/07/19 1247 10/08/19 0836   10/07/19 1245  ampicillin (OMNIPEN) 2 g in sodium chloride 0.9 % 100 mL IVPB  Status:  Discontinued     2 g 300 mL/hr over 20 Minutes Intravenous Every 6 hours 10/07/19 1238 10/08/19 0836   10/07/19 0200  cefTRIAXone (ROCEPHIN) 2 g in sodium chloride 0.9 % 100 mL IVPB  Status:  Discontinued     2 g 200 mL/hr over 30 Minutes Intravenous Every 12 hours 10/06/19 1610 10/08/19 0836   10/06/19 1615  vancomycin (VANCOCIN) 1,250 mg in sodium chloride 0.9 % 250 mL IVPB     1,250 mg 166.7 mL/hr over 90 Minutes Intravenous  Once 10/06/19 1608 10/06/19 2111   10/06/19 1615  ampicillin (OMNIPEN) 2 g in sodium chloride 0.9 % 100 mL IVPB  Status:  Discontinued     2 g 300 mL/hr over 20 Minutes Intravenous Every 4 hours 10/06/19 1613 10/07/19 0427   10/06/19 1600  acyclovir (ZOVIRAX) 550 mg in dextrose 5 % 100 mL IVPB     550 mg 111 mL/hr over 60 Minutes Intravenous  Once 10/06/19 1558 10/06/19 2216   10/06/19 1230  cefTRIAXone (ROCEPHIN) 2 g in sodium chloride 0.9 % 100 mL IVPB     2 g 200 mL/hr over 30 Minutes Intravenous  Once 10/06/19 1215 10/06/19 1431       Objective: Vitals:   10/09/19 0900 10/09/19 1000 10/09/19 1100 10/09/19 1200  BP: (!) 88/71 (!)  Pulse: 60 60 67 62  Resp: (!) 21 19 (!) 21 20  Temp:   98 F (36.7 C)   TempSrc:   Oral   SpO2: 98% 98% 97% 97%  Weight:      Height:        Intake/Output Summary (Last 24 hours) at 10/09/2019 1251 Last data filed at 10/09/2019 1200 Gross per 24 hour  Intake 2427.26 ml  Output --  Net 2427.26 ml   Filed Weights   10/06/19 1503 10/07/19 0142 10/08/19 0641  Weight: 54.4 kg 45.2 kg 47.7 kg    Examination: General exam: Appears calm and comfortable  Respiratory system: Clear to auscultation. Respiratory effort normal.  On room air Cardiovascular system: S1 & S2 heard, RRR. No pedal  edema. Gastrointestinal system: Abdomen is nondistended, soft and nontender. Normal bowel sounds heard. Central nervous system: Alert and oriented. Non focal exam. Speech clear  Extremities: Symmetric in appearance bilaterally  Skin: No rashes, lesions or ulcers on exposed skin  Psychiatry: Judgement and insight appear stable. Mood & affect appropriate.    Data Reviewed: I have personally reviewed following labs and imaging studies  CBC: Recent Labs  Lab 10/06/19 1240 10/07/19 0429 10/08/19 0531 10/09/19 0459  WBC 14.9* 7.3 10.4 12.9*  NEUTROABS 12.1*  --   --   --   HGB 11.9* 10.5* 10.6* 10.5*  HCT 37.1 30.4* 29.8* 29.7*  MCV 97.6 89.4 87.6 88.1  PLT 101* 55* 43* 33*   Basic Metabolic Panel: Recent Labs  Lab 10/06/19 2014 10/07/19 0429 10/08/19 0531 10/08/19 1603 10/09/19 0459  NA 136 137 134* 135 137  K 3.1* 2.1* 3.4* 3.7 4.0  CL 108 110  109 111 115*  CO2 14* 15* 14* 12* 15*  GLUCOSE 186* 95 162* 205* 129*  BUN 16 12 24* 23 25*  CREATININE 0.89 0.73 0.76 0.83 0.68  CALCIUM 8.2* 8.0* 7.8* 7.9* 7.8*  MG  --  1.6* 2.1 1.8 1.9   GFR: Estimated Creatinine Clearance: 47.9 mL/min (by C-G formula based on SCr of 0.68 mg/dL). Liver Function Tests: Recent Labs  Lab 10/06/19 1240 10/07/19 0429 10/08/19 0531 10/09/19 0459  AST 64* 56* 2,353* 995*  ALT 84* 59* 885* 794*  ALKPHOS 118 99 98 109  BILITOT 1.5* 0.5 0.9 1.2  PROT 6.7 5.4* 5.4* 5.0*  ALBUMIN 2.8* 2.0* 2.0* 1.8*   No results for input(s): LIPASE, AMYLASE in the last 168 hours. No results for input(s): AMMONIA in the last 168 hours. Coagulation Profile: Recent Labs  Lab 10/06/19 1240 10/08/19 1215 10/09/19 1102  INR 1.2 1.3* 1.3*   Cardiac Enzymes: Recent Labs  Lab 10/09/19 0633  CKTOTAL 41   BNP (last 3 results) No results for input(s): PROBNP in the last 8760 hours. HbA1C: Recent Labs    10/07/19 0429  HGBA1C 16.4*   CBG: Recent Labs  Lab 10/08/19 1943 10/08/19 2331 10/09/19 0427  10/09/19 0819 10/09/19 1115  GLUCAP 173* 108* 122* 122* 107*   Lipid Profile: No results for input(s): CHOL, HDL, LDLCALC, TRIG, CHOLHDL, LDLDIRECT in the last 72 hours. Thyroid Function Tests: Recent Labs    10/08/19 1603  TSH 0.894   Anemia Panel: No results for input(s): VITAMINB12, FOLATE, FERRITIN, TIBC, IRON, RETICCTPCT in the last 72 hours. Sepsis Labs: Recent Labs  Lab 10/07/19 1039 10/08/19 1611 10/08/19 2236 10/09/19 1102  LATICACIDVEN 1.5 4.3* 2.7* 1.7    Recent Results (from the past 240 hour(s))  Blood Culture (routine x 2)     Status: Abnormal   Collection Time: 10/06/19 12:40 PM   Specimen: BLOOD  Result Value Ref Range Status   Specimen Description BLOOD RIGHT ANTECUBITAL  Final   Special Requests   Final    BOTTLES DRAWN AEROBIC AND ANAEROBIC Blood Culture results may not be optimal due to an inadequate volume of blood received in culture bottles   Culture  Setup Time   Final    IN BOTH AEROBIC AND ANAEROBIC BOTTLES GRAM NEGATIVE RODS Organism ID to follow CRITICAL RESULT CALLED TO, READ BACK BY AND VERIFIED WITH: Evelena Peat [facility] 10/07/19 0419 JDW Performed at [facility], [address] [place], [place] [address]    Culture KLEBSIELLA PNEUMONIAE (A)  Final   Report Status 10/09/2019 FINAL  Final   Organism ID, Bacteria KLEBSIELLA PNEUMONIAE  Final      Susceptibility   Klebsiella pneumoniae - MIC*    AMPICILLIN >=32 RESISTANT Resistant     CEFAZOLIN <=4 SENSITIVE Sensitive     CEFEPIME <=1 SENSITIVE Sensitive     CEFTAZIDIME <=1 SENSITIVE Sensitive     CEFTRIAXONE <=1 SENSITIVE Sensitive     CIPROFLOXACIN <=0.25 SENSITIVE Sensitive     GENTAMICIN <=1 SENSITIVE Sensitive     IMIPENEM 0.5 SENSITIVE Sensitive     TRIMETH/SULFA <=20 SENSITIVE Sensitive     AMPICILLIN/SULBACTAM 8 SENSITIVE Sensitive     PIP/TAZO <=4 SENSITIVE Sensitive     Extended ESBL NEGATIVE Sensitive     * KLEBSIELLA PNEUMONIAE  Blood Culture ID Panel (Reflexed)      Status: Abnormal   Collection Time: 10/06/19 12:40 PM  Result Value Ref Range Status   Enterococcus species NOT DETECTED NOT DETECTED  Final   Listeria monocytogenes NOT DETECTED NOT DETECTED Final   Staphylococcus species NOT DETECTED NOT DETECTED Final   Staphylococcus aureus (BCID) NOT DETECTED NOT DETECTED Final   Streptococcus species NOT DETECTED NOT DETECTED Final   Streptococcus agalactiae NOT DETECTED NOT DETECTED Final   Streptococcus pneumoniae NOT DETECTED NOT DETECTED Final   Streptococcus pyogenes NOT DETECTED NOT DETECTED Final   Acinetobacter baumannii NOT DETECTED NOT DETECTED Final   Enterobacteriaceae species DETECTED (A) NOT DETECTED Final    Comment: Enterobacteriaceae represent a large family of gram-negative bacteria, not a single organism. CRITICAL RESULT CALLED TO, READ BACK BY AND VERIFIED WITH: G ABBOTT PHARMD 10/07/19 0419 JDW    Enterobacter cloacae complex NOT DETECTED NOT DETECTED Final   Escherichia coli NOT DETECTED NOT DETECTED Final   Klebsiella oxytoca NOT DETECTED NOT DETECTED Final   Klebsiella pneumoniae DETECTED (A) NOT DETECTED Final    Comment: CRITICAL RESULT CALLED TO, READ BACK BY AND VERIFIED WITH: G ABBOTT PHARMD 10/07/19 0419 JDW    Proteus species NOT DETECTED NOT DETECTED Final   Serratia marcescens NOT DETECTED NOT DETECTED Final   Carbapenem resistance NOT DETECTED NOT DETECTED Final   Haemophilus influenzae NOT DETECTED NOT DETECTED Final   Neisseria meningitidis NOT DETECTED NOT DETECTED Final   Pseudomonas aeruginosa NOT DETECTED NOT DETECTED Final   Candida albicans NOT DETECTED NOT DETECTED Final   Candida glabrata NOT DETECTED NOT DETECTED Final   Candida krusei NOT DETECTED NOT DETECTED Final   Candida parapsilosis NOT DETECTED NOT DETECTED Final   Candida tropicalis NOT DETECTED NOT DETECTED Final    Comment: Performed at [facility], [address] [place], [place] [address]  Blood Culture (routine x 2)      Status: Abnormal   Collection Time: 10/06/19 12:45 PM   Specimen: BLOOD RIGHT HAND  Result Value Ref Range Status   Specimen Description BLOOD RIGHT HAND  Final   Special Requests   Final    BOTTLES DRAWN AEROBIC AND ANAEROBIC Blood Culture results may not be optimal due to an inadequate volume of blood received in culture bottles   Culture  Setup Time   Final    IN BOTH AEROBIC AND ANAEROBIC BOTTLES GRAM NEGATIVE RODS CRITICAL VALUE NOTED.  VALUE IS CONSISTENT WITH PREVIOUSLY REPORTED AND CALLED VALUE.    Culture (A)  Final    KLEBSIELLA PNEUMONIAE SUSCEPTIBILITIES PERFORMED ON PREVIOUS CULTURE WITHIN THE LAST 5 DAYS. Performed at [facility], [address], [place], [place] [address]    Report Status 10/09/2019 FINAL  Final  SARS CORONAVIRUS 2 (TAT 6-24 HRS) Nasopharyngeal Nasopharyngeal Swab     Status: None   Collection Time: 10/06/19  3:35 PM   Specimen: Nasopharyngeal Swab  Result Value Ref Range Status   SARS Coronavirus 2 NEGATIVE NEGATIVE Final    Comment: (NOTE) SARS-CoV-2 target nucleic acids are NOT DETECTED. The SARS-CoV-2 RNA is generally detectable in upper and lower respiratory specimens during the acute phase of infection. Negative results do not preclude SARS-CoV-2 infection, do not rule out co-infections with other pathogens, and should not be used as the sole basis for treatment or other patient management decisions. Negative results must be combined with clinical observations, patient history, and epidemiological information. The expected result is Negative. Fact Sheet for Patients: HairSlick.no Fact Sheet for Healthcare Providers: quierodirigir.com This test is not yet approved or cleared by the [place] FDA and  has been authorized for detection and/or diagnosis of SARS-CoV-2 by  FDA under an Emergency Use Authorization (EUA). This EUA will remain  in effect (meaning this test can be used)  for the duration of the COVID-19 declaration under Section 56 4(b)(1) of the Act, 21 U.S.C. section 360bbb-3(b)(1), unless the authorization is terminated or revoked sooner. Performed at [facility], [address] [place], [place] [address]   Urine culture     Status: Abnormal   Collection Time: 10/06/19  4:02 PM   Specimen: In/Out Cath Urine  Result Value Ref Range Status   Specimen Description IN/OUT CATH URINE  Final   Special Requests   Final    NONE Performed at [facility], [address] [place], [place] [address]    Culture (A)  Final    30,000 COLONIES/mL ESCHERICHIA COLI Confirmed Extended Spectrum Beta-Lactamase Producer (ESBL).  In bloodstream infections from ESBL organisms, carbapenems are preferred over piperacillin/tazobactam. They are shown to have a lower risk of mortality. 10,000 COLONIES/mL STAPHYLOCOCCUS EPIDERMIDIS    Report Status 10/08/2019 FINAL  Final   Organism ID, Bacteria ESCHERICHIA COLI (A)  Final   Organism ID, Bacteria STAPHYLOCOCCUS EPIDERMIDIS (A)  Final      Susceptibility   Escherichia coli - MIC*    AMPICILLIN >=32 RESISTANT Resistant     CEFAZOLIN >=64 RESISTANT Resistant     CEFTRIAXONE >=64 RESISTANT Resistant     CIPROFLOXACIN >=4 RESISTANT Resistant     GENTAMICIN <=1 SENSITIVE Sensitive     IMIPENEM <=0.25 SENSITIVE Sensitive     NITROFURANTOIN <=16 SENSITIVE Sensitive     TRIMETH/SULFA <=20 SENSITIVE Sensitive     AMPICILLIN/SULBACTAM 4 SENSITIVE Sensitive     PIP/TAZO <=4 SENSITIVE Sensitive     Extended ESBL POSITIVE Resistant     * 30,000 COLONIES/mL ESCHERICHIA COLI   Staphylococcus epidermidis - MIC*    CIPROFLOXACIN 2 INTERMEDIATE Intermediate     GENTAMICIN <=0.5 SENSITIVE Sensitive     NITROFURANTOIN <=16 SENSITIVE Sensitive     OXACILLIN >=4 RESISTANT Resistant     TETRACYCLINE <=1 SENSITIVE Sensitive     VANCOMYCIN 1 SENSITIVE Sensitive     TRIMETH/SULFA <=10 SENSITIVE Sensitive     CLINDAMYCIN  <=0.25 SENSITIVE Sensitive     RIFAMPIN <=0.5 SENSITIVE Sensitive     Inducible Clindamycin NEGATIVE Sensitive     * 10,000 COLONIES/mL STAPHYLOCOCCUS EPIDERMIDIS  MRSA PCR Screening     Status: None   Collection Time: 10/08/19 10:50 PM   Specimen: Nasal Mucosa; Nasopharyngeal  Result Value Ref Range Status   MRSA by PCR NEGATIVE NEGATIVE Final    Comment:        The GeneXpert MRSA Assay (FDA approved for NASAL specimens only), is one component of a comprehensive MRSA colonization surveillance program. It is not intended to diagnose MRSA infection nor to guide or monitor treatment for MRSA infections. Performed at [facility], [address] [place], [place] [address]       Radiology Studies: Ct Abdomen Pelvis W Contrast  Result Date: 10/08/2019 CLINICAL DATA:  73 year old female with bacteremia. EXAM: CT ABDOMEN AND PELVIS WITH CONTRAST TECHNIQUE: Multidetector CT imaging of the abdomen and pelvis was performed using the standard protocol following bolus administration of intravenous contrast. CONTRAST:  100mL OMNIPAQUE IOHEXOL 300 MG/ML  SOLN COMPARISON:  None. FINDINGS: Evaluation of this exam is limited due to respiratory motion artifact. Lower chest: Partially visualized small bilateral pleural effusions with associated partial compressive atelectasis of the lower lobes. Multiple patchy areas of airspace consolidation in the  visualized lung fields most consistent with multifocal pneumonia. Several peripheral and subpleural airspace opacity may have central cavitation (series 3, image 1) and may represent fungal infection, abscesses, or septic emboli. Clinical correlation is recommended. Cardiac pacemaker wires noted. No intra-abdominal free air. There is a small free fluid within the pelvis as well as a small perihepatic free fluid. Hepatobiliary: There is heterogeneous enhancement of the liver. Two adjacent hypoenhancing areas noted in the right lobe of the liver each  measuring approximately 2.4 x 2.4 cm concerning for areas of infection or developing abscesses. There is mild periportal edema. There is diffuse thickening of the gallbladder wall with pericholecystic fluid. No definite calcified stone identified. Evaluation of the gallbladder is very limited due to respiratory motion artifact. Sludge or noncalcified stone may be present within the gallbladder. Further evaluation with ultrasound or MRCP is recommended. Pancreas: Unremarkable. No pancreatic ductal dilatation or surrounding inflammatory changes. Spleen: Normal in size without focal abnormality. Adrenals/Urinary Tract: The adrenal glands are unremarkable. There is no hydronephrosis on either side. There is symmetric enhancement and excretion of contrast by both kidneys. The visualized ureters and urinary bladder appear unremarkable. Stomach/Bowel: There is no bowel obstruction. Mild diffuse thickened appearance of the colonic wall may be related to underdistention. Clinical correlation is recommended to evaluate for mild colitis. The appendix is normal. Vascular/Lymphatic: Mild aortoiliac atherosclerotic disease. The IVC is unremarkable. No portal venous gas. Mildly enlarged periportal lymph nodes, reactive. Reproductive: The uterus is grossly unremarkable. No adnexal masses. Other: There is diffuse subcutaneous edema. No fluid collection. Musculoskeletal: Degenerative changes of the spine. No acute osseous pathology. IMPRESSION: 1. Small bilateral pleural effusions and findings of multifocal pneumonia. Clinical correlation is recommended. 2. Diffuse gallbladder wall edema and pericholecystic fluid. Additionally there is periportal edema and findings concerning for developing intrahepatic abscesses. Findings may represent cholangitis. Clinical correlation and further evaluation with MRCP is recommended. 3. Underdistention of the colon versus less likely mild colitis. Clinical correlation is recommended. No bowel  obstruction. Normal appendix. Aortic Atherosclerosis (ICD10-I70.0). Electronically Signed   By: Elgie Collard M.D.   On: 10/08/2019 00:18   US Abdomen Limited  Result Date: 10/08/2019 CLINICAL DATA:  Ascending cholangitis.  Abnormal CT. EXAM: ULTRASOUND ABDOMEN LIMITED RIGHT UPPER QUADRANT COMPARISON:  None. FINDINGS: Gallbladder: There is gallbladder wall thickening with the gallbladder measuring approximately 3-4 mm. There is pericholecystic free fluid. Gallbladder sludge is noted. The sonographic Eulah Pont sign was not reliably evaluated secondary to patient condition. Common bile duct: Diameter: 3 mm Liver: There is a 5 x 2.5 x 4.8 cm collection in the right hepatic lobe. This collection demonstrates no significant internal color Doppler flow. Portal vein is patent on color Doppler imaging with normal direction of blood flow towards the liver. Other: There is a right-sided pleural effusion. IMPRESSION: 1. Gallbladder sludge with pericholecystic free fluid and gallbladder wall thickening. Findings could represent acute cholecystitis in the appropriate clinical setting. The sonographic Eulah Pont sign could not be reliably assessed. Further evaluation can be performed with HIDA scan as clinically indicated. 2. Complex collection in the right hepatic lobe as detailed above, again concerning for developing intrahepatic abscess in the appropriate clinical setting. 3. Incidentally noted right-sided pleural effusion. Electronically Signed   By: Katherine Mantle M.D.   On: 10/08/2019 21:02   Dg Chest Port 1 View  Result Date: 10/08/2019 CLINICAL DATA:  Hypoxia and shortness of breath. EXAM: PORTABLE CHEST 1 VIEW COMPARISON:  10/07/2019 and 10/06/2019 FINDINGS: Left-sided pacemaker unchanged. Lungs are adequately inflated demonstrate  continued mild patchy bilateral airspace process with slight worsening over the left midlung and left upper lobe. No effusion. Cardiomediastinal silhouette and remainder of the exam  is unchanged. IMPRESSION: Slight interval worsening bilateral patchy airspace process likely multifocal infection. Electronically Signed   By: Elberta Fortis M.D.   On: 10/08/2019 16:00   Dg Chest Port 1 View  Result Date: 10/07/2019 CLINICAL DATA:  Shortness of breath EXAM: PORTABLE CHEST 1 VIEW COMPARISON:  10/06/2019 FINDINGS: The heart size and mediastinal contours are within normal limits. Left chest multi lead pacer. Increased multifocal heterogeneous opacity of the bilateral mid lungs and perihilar right lung. The visualized skeletal structures are unremarkable. IMPRESSION: Increased multifocal heterogeneous opacity of the bilateral mid lungs and perihilar right lung, concerning for multifocal infection. Electronically Signed   By: Lauralyn Primes M.D.   On: 10/07/2019 19:26      Scheduled Meds:  Chlorhexidine Gluconate Cloth  6 each Topical Daily   flecainide  50 mg Oral BID   insulin aspart  0-20 Units Subcutaneous Q4H   insulin glargine  10 Units Subcutaneous Daily   pantoprazole (PROTONIX) IV  40 mg Intravenous Q24H   Continuous Infusions:  sodium chloride Stopped (10/09/19 0959)   lactated ringers 100 mL/hr at 10/09/19 1200   meropenem (MERREM) IV Stopped (10/09/19 1029)     LOS: 3 days      Time spent: 35 minutes   Noralee Stain, DO [facility] 10/09/2019, 12:51 PM   Available via Epic secure chat 7am-7pm After these hours, please refer to coverage provider listed on [email or web address]

## 2019-10-09 NOTE — Progress Notes (Signed)
Patient ID: Courtney [name], female   DOB: 12-04-1946, 73 y.o.   MRN: [id number]   Request for consideration of percutaneous cholecystostomy drain placement And Hepatic abscess aspiration/drain ----- per CCM  DR Kathlene Cote has reviewed all imaging:  If CCS says pt is not a surgical candidate (this is the case per Eric Form NP CCM) Then he recommends HIDA scan first.  If deemed appropriate after HIDA-- IR can move forward with Perc chole drain and  Hepatic abscess aspiration--- POSSIBLE drain placement Hepatic abscess is small-- may only be able to aspirate.  Will await HIDA result before we move ahead with anything. We will move ahead with both procedures in IR after results are reviewed with Dr Ivor Costa NP aware and agreeable to plan

## 2019-10-10 ENCOUNTER — Inpatient Hospital Stay (HOSPITAL_COMMUNITY): Payer: Medicare HMO

## 2019-10-10 ENCOUNTER — Encounter (HOSPITAL_COMMUNITY): Payer: Self-pay | Admitting: Diagnostic Radiology

## 2019-10-10 DIAGNOSIS — K750 Abscess of liver: Secondary | ICD-10-CM

## 2019-10-10 DIAGNOSIS — F17200 Nicotine dependence, unspecified, uncomplicated: Secondary | ICD-10-CM

## 2019-10-10 DIAGNOSIS — R509 Fever, unspecified: Secondary | ICD-10-CM

## 2019-10-10 DIAGNOSIS — I480 Paroxysmal atrial fibrillation: Secondary | ICD-10-CM

## 2019-10-10 DIAGNOSIS — R7881 Bacteremia: Secondary | ICD-10-CM | POA: Diagnosis not present

## 2019-10-10 DIAGNOSIS — B961 Klebsiella pneumoniae [K. pneumoniae] as the cause of diseases classified elsewhere: Secondary | ICD-10-CM

## 2019-10-10 DIAGNOSIS — E1110 Type 2 diabetes mellitus with ketoacidosis without coma: Secondary | ICD-10-CM

## 2019-10-10 DIAGNOSIS — I495 Sick sinus syndrome: Secondary | ICD-10-CM

## 2019-10-10 DIAGNOSIS — R188 Other ascites: Secondary | ICD-10-CM

## 2019-10-10 DIAGNOSIS — R4182 Altered mental status, unspecified: Secondary | ICD-10-CM

## 2019-10-10 DIAGNOSIS — A419 Sepsis, unspecified organism: Secondary | ICD-10-CM | POA: Diagnosis not present

## 2019-10-10 DIAGNOSIS — Z950 Presence of cardiac pacemaker: Secondary | ICD-10-CM

## 2019-10-10 DIAGNOSIS — R945 Abnormal results of liver function studies: Secondary | ICD-10-CM | POA: Diagnosis not present

## 2019-10-10 HISTORY — PX: IR PARACENTESIS: IMG2679

## 2019-10-10 HISTORY — PX: IR US GUIDE BX ASP/DRAIN: IMG2392

## 2019-10-10 LAB — LACTATE DEHYDROGENASE, PLEURAL OR PERITONEAL FLUID: LD, Fluid: 43 U/L — ABNORMAL HIGH (ref 3–23)

## 2019-10-10 LAB — URINALYSIS, ROUTINE W REFLEX MICROSCOPIC
Bilirubin Urine: NEGATIVE
Glucose, UA: 50 mg/dL — AB
Hgb urine dipstick: NEGATIVE
Ketones, ur: 5 mg/dL — AB
Leukocytes,Ua: NEGATIVE
Nitrite: NEGATIVE
Protein, ur: NEGATIVE mg/dL
Specific Gravity, Urine: 1.021 (ref 1.005–1.030)
pH: 6 (ref 5.0–8.0)

## 2019-10-10 LAB — GLUCOSE, PLEURAL OR PERITONEAL FLUID: Glucose, Fluid: 110 mg/dL

## 2019-10-10 LAB — BASIC METABOLIC PANEL
Anion gap: 10 (ref 5–15)
BUN: 30 mg/dL — ABNORMAL HIGH (ref 8–23)
CO2: 15 mmol/L — ABNORMAL LOW (ref 22–32)
Calcium: 7.8 mg/dL — ABNORMAL LOW (ref 8.9–10.3)
Chloride: 112 mmol/L — ABNORMAL HIGH (ref 98–111)
Creatinine, Ser: 0.72 mg/dL (ref 0.44–1.00)
GFR calc Af Amer: >60 mL/min (ref >60)
GFR calc non Af Amer: >60 mL/min (ref >60)
Glucose, Bld: 118 mg/dL — ABNORMAL HIGH (ref 70–99)
Potassium: 4.5 mmol/L (ref 3.5–5.1)
Sodium: 137 mmol/L (ref 135–145)

## 2019-10-10 LAB — BODY FLUID CELL COUNT WITH DIFFERENTIAL
Eos, Fluid: 0 %
Lymphs, Fluid: 34 %
Monocyte-Macrophage-Serous Fluid: 12 % — ABNORMAL LOW (ref 50–90)
Neutrophil Count, Fluid: 47 % — ABNORMAL HIGH (ref 0–25)
Total Nucleated Cell Count, Fluid: 175 cu mm (ref 0–1000)

## 2019-10-10 LAB — CBC
HCT: 36.2 % (ref 36.0–46.0)
Hemoglobin: 13 g/dL (ref 12.0–15.0)
MCH: 31.3 pg (ref 26.0–34.0)
MCHC: 35.9 g/dL (ref 30.0–36.0)
MCV: 87.2 fL (ref 80.0–100.0)
Platelets: 60 10*3/uL — ABNORMAL LOW (ref 150–400)
RBC: 4.15 MIL/uL (ref 3.87–5.11)
RDW: 13.1 % (ref 11.5–15.5)
WBC: 20.6 10*3/uL — ABNORMAL HIGH (ref 4.0–10.5)
nRBC: 0.1 % (ref 0.0–0.2)

## 2019-10-10 LAB — HEPATIC FUNCTION PANEL
ALT: 481 U/L — ABNORMAL HIGH (ref 0–44)
AST: 293 U/L — ABNORMAL HIGH (ref 15–41)
Albumin: 1.5 g/dL — ABNORMAL LOW (ref 3.5–5.0)
Alkaline Phosphatase: 114 U/L (ref 38–126)
Bilirubin, Direct: 0.3 mg/dL — ABNORMAL HIGH (ref 0.0–0.2)
Indirect Bilirubin: 0.9 mg/dL (ref 0.3–0.9)
Total Bilirubin: 1.2 mg/dL (ref 0.3–1.2)
Total Protein: 4.6 g/dL — ABNORMAL LOW (ref 6.5–8.1)

## 2019-10-10 LAB — CULTURE, BLOOD (ROUTINE X 2)

## 2019-10-10 LAB — GLUCOSE, CAPILLARY
Glucose-Capillary: 112 mg/dL — ABNORMAL HIGH (ref 70–99)
Glucose-Capillary: 122 mg/dL — ABNORMAL HIGH (ref 70–99)
Glucose-Capillary: 167 mg/dL — ABNORMAL HIGH (ref 70–99)
Glucose-Capillary: 83 mg/dL (ref 70–99)
Glucose-Capillary: 88 mg/dL (ref 70–99)
Glucose-Capillary: 88 mg/dL (ref 70–99)
Glucose-Capillary: 98 mg/dL (ref 70–99)

## 2019-10-10 LAB — PROTEIN, PLEURAL OR PERITONEAL FLUID: Total protein, fluid: <3 g/dL

## 2019-10-10 LAB — HEPATITIS B SURFACE ANTIBODY, QUANTITATIVE: Hep B S AB Quant (Post): 11.7 m[IU]/mL (ref >9.9)

## 2019-10-10 MED ORDER — FENTANYL CITRATE (PF) 100 MCG/2ML IJ SOLN
INTRAMUSCULAR | Status: AC | PRN
  Administered 2019-10-10: 12.5 ug via INTRAVENOUS

## 2019-10-10 MED ORDER — LIDOCAINE HCL (PF) 1 % IJ SOLN
INTRAMUSCULAR | Status: AC | PRN
  Administered 2019-10-10: 10 mL

## 2019-10-10 MED ORDER — ORAL CARE MOUTH RINSE
15.0000 mL | Freq: Two times a day (BID) | OROMUCOSAL | Status: DC
Start: 2019-10-10 — End: 2019-10-18
  Administered 2019-10-10 – 2019-10-16 (×10): 15 mL via OROMUCOSAL

## 2019-10-10 MED ORDER — CHLORHEXIDINE GLUCONATE 0.12 % MT SOLN
15.0000 mL | Freq: Two times a day (BID) | OROMUCOSAL | Status: DC
  Administered 2019-10-10 – 2019-10-17 (×11): 15 mL via OROMUCOSAL
  Filled 2019-10-10 (×11): qty 15

## 2019-10-10 MED ORDER — LIDOCAINE HCL 1 % IJ SOLN
INTRAMUSCULAR | Status: AC
  Filled 2019-10-10: qty 20

## 2019-10-10 MED ORDER — MIDAZOLAM HCL 2 MG/2ML IJ SOLN
INTRAMUSCULAR | Status: AC | PRN
  Administered 2019-10-10: 0.5 mg via INTRAVENOUS

## 2019-10-10 MED ORDER — MIDAZOLAM HCL 2 MG/2ML IJ SOLN
INTRAMUSCULAR | Status: AC
  Filled 2019-10-10: qty 2

## 2019-10-10 MED ORDER — FENTANYL CITRATE (PF) 100 MCG/2ML IJ SOLN
INTRAMUSCULAR | Status: AC
  Filled 2019-10-10: qty 2

## 2019-10-10 NOTE — Progress Notes (Addendum)
[place]:  Araceli H Ruddick, MRN:  [id number], DOB:  01/30/1946, LOS: 4 ADMISSION DATE:  10/06/2019, CONSULTATION DATE:  10/08/2019 REFERRING MD:  Alvino Chapelhoi CHIEF COMPLAINT:  Hypoxia/AMS   Brief History   73 yo [place], non English speaking female presenting with fever and AMS found to be in DKA and sepsis.  Noted to have Klebsiella bacteremia and E. Coli ESBL UTI, and abnormal abdominal CT concerning for acute cholangitis with possible intrahepatic abscesses.  PCCM consulted 10/26 for worsening hypoxia / dyspnea, stridor and encephalopathy.    History of present illness   HPI obtained from medical chart review and per patient's son at bedside.   73 year old [place]namese, non-English speaking female with past medical history significant for diabetes type 2 diet controlled, atrial fibrillation on Eliquis, and tachy-brady syndrome s/p pacemaker insertion who presented with altered mental status and not feeling well for three days.  Patient lives with her son.  He states she is normally independent in her ADLs and at baseline alert and oriented.  He noticed she had fever and chills for several days.   Found to be febrile and in DKA with A1c noted at 16.4, started on insulin gtt. [facility]  History has been limited given encephalopathy, patient cooperation, and language barrier.  CT head was negative.  Lumbar puncture defered given unclear last dose Eliquis.  Given concern for sepsis, she was cultured and started on empiric antibiotics with meningeal coverage- vancomycin, ampicillin, and ceftriaxone.  Additionally was in Afib with RVR with rates controlled on cardizem.  Blood culture noted for Klebsiella bacteremia and ESBL E. Coli UTI, and CXR concerning for multifocal pneumonia.  Given her increasing transaminitis, CT abdomen and pelvis revealed diffuse gallbladder edema, pericholecystic fluid with concern for intrahepatic abscess and concern for cholangitis.  GI consulted with recommendations for  MRCP.  Based on susceptibilities, [facility]  Since admission, she has been transitioned off her insulin and cardizem gtt.  On the afternoon of 10/26, she was noted have worsening dyspnea and desaturation on room air into the 70's with hypoxia and stridor with deteriorating mental status and some mottling noted.  Rapid response at bedside.  She was treated with solumedrol and racemic epi and placed on NRB.  Lactic, CXR and ABG ordered.  PCCM consulted for possible transfer to ICU given decompensation.   Past Medical History  diabetes type 2 diet controlled, atrial fibrillation on Eliquis, and tachy-brady syndrome s/p pacemaker insertion 2018  Significant Hospital Events   10/24 Admit 10/26 GI and PCCM consult  Consults:  GI PCCM  Surgery IR   Procedures:  IR 10/28>>  Significant Diagnostic Tests:  10/24 CTH  Brain parenchyma appears unremarkable. No evident acute infarct. No mass or hemorrhage.  There are foci of arterial vascular calcification. There is mucosal thickening in several ethmoid air cells.  10/26 CTA A/P  1. Small bilateral pleural effusions and findings of multifocal pneumonia. Clinical correlation is recommended. 2. Diffuse gallbladder wall edema and pericholecystic fluid.  Additionally there is periportal edema and findings concerning for developing intrahepatic abscesses. Findings may represent cholangitis. Clinical correlation and further evaluation with MRCP is recommended. 3. Underdistention of the colon versus less likely mild colitis.  Clinical correlation is recommended. No bowel obstruction. Normal appendix. Aortic Atherosclerosis  10/27 HIDA There is visualization of the gallbladder which does indicate patency of the cystic duct. Relative delay in visualization of the gallbladder compared to small bowel may be a finding  indicative of chronic cholecystitis. Common bile duct is patent as is evidenced by visualization of the small  bowel.  Micro Data:  10/24 SARS CoV2 >> neg 10/24 UC >> e. Coli ESBL, staph epidermidis 10/24 BCx 2 >> klebsiella pna  Antimicrobials:  10/24 acyclovir x 1 dose 10/24 ampicillin >> 10/25 10/24 ceftriaxone >> 10/25 10/24 vancomycin >> 10/25 10/26 merrem >>  Interim history/subjective:  VSS.  No complaints. Lactic acid cleared.  For IR today.   Objective   Blood pressure 93/65, pulse 64, temperature 97.8 F (36.6 C), temperature source Oral, resp. rate 19, height 5\' 1"  (1.549 m), weight 47.7 kg, SpO2 96 %.        Intake/Output Summary (Last 24 hours) at 10/10/2019 0921 Last data filed at 10/10/2019 0600 Gross per 24 hour  Intake 2184.14 ml  Output 250 ml  Net 1934.14 ml   Filed Weights   10/06/19 1503 10/07/19 0142 10/08/19 0641  Weight: 54.4 kg 45.2 kg 47.7 kg    Examination: Son bedside and interpreting  General: Thin. Well-developed, well nourished. NAD HENT: Normocephalic, PERRL. Moist mucus membranes Neck: No JVD. Trachea midline.  CV: RRR. S1S2. No MRG. +2 distal pulses Lungs: BBS present, clear, FNL, symmetrical ABD: +BS x4. SNT/ND. No masses, guarding or rigidity GU: No Foley EXT: MAE well. Trace edema to upper EXT  Skin: PWD. In tact. No rashes or lesions Neuro: A&Ox3. CN II-XII in tact. No focal deficits    Resolved Hospital Problem list   Acute respiratory distress with stridor-resolved DKA Acute metabolic encephalopathy Assessment & Plan:  This is a 73yo F admitted with AMS and DKA( which had resolved), A1c 14.6, found to have Klebsiella bacteremia thought to be 2/2 UTI and cholangits now with sudden onset respiratory distress with stridor responsive to racemic epi and steroids and AMS with metabolic acidosis.   Severe sepsis 2/2/ klebsiella bacteremia, ESBL UTI, pneumonia with Liver abscess 2/2 hepatitis, improved-cholecystitis/chloangitis r/o.  She is hemodynamically stable, no pressors. LFTs improved. Plan Cont Abx  IR today  No role for  surgery  Remaining plan per primary/IR/GI    PAF with h/o tachybrady syndrome on long term AC Thrombocytopenia-improved  Plan Continue to hold [facility] for now for procedures and TCP  SCDs only for DVT prophylaxis  Continue flecanide for now I  DM with poor control  Plan SSI Per primary    Best practice:  Diet: NPO Pain/Anxiety/Delirium protocol (if indicated): prn VAP protocol (if indicated): not indicated DVT prophylaxis: SCDs for now GI prophylaxis: PPI Glucose control: SSI Mobility: Bed rest  Code Status: FULL Family Communication: updated at bedside Disposition: pt may transfer to progressive care. D/[facility].   PCCM will sign off. Thank you for the opportunity to participate in this patient's care. Please contact if we can be of further assistance.   Labs   CBC: Recent Labs  Lab 10/06/19 1240 10/07/19 0429 10/08/19 0531 10/09/19 0459 10/10/19 0419  WBC 14.9* 7.3 10.4 12.9* 20.6*  NEUTROABS 12.1*  --   --   --   --   HGB 11.9* 10.5* 10.6* 10.5* 13.0  HCT 37.1 30.4* 29.8* 29.7* 36.2  MCV 97.6 89.4 87.6 88.1 87.2  PLT 101* 55* 43* 33* 60*    Basic Metabolic Panel: Recent Labs  Lab 10/07/19 0429 10/08/19 0531 10/08/19 1603 10/09/19 0459 10/10/19 0419  NA 137 134* 135 137 137  K 2.1* 3.4* 3.7 4.0 4.5  CL 110 109 111 115* 112*  CO2 15* 14* 12*  15* 15*  GLUCOSE 95 162* 205* 129* 118*  BUN 12 24* 23 25* 30*  CREATININE 0.73 0.76 0.83 0.68 0.72  CALCIUM 8.0* 7.8* 7.9* 7.8* 7.8*  MG 1.6* 2.1 1.8 1.9  --    GFR: Estimated Creatinine Clearance: 47.9 mL/min (by C-G formula based on SCr of 0.72 mg/dL). Recent Labs  Lab 10/07/19 0429  10/07/19 1039 10/08/19 0531 10/08/19 1611 10/08/19 2236 10/09/19 0459 10/09/19 1102 10/10/19 0419  WBC 7.3  --   --  10.4  --   --  12.9*  --  20.6*  LATICACIDVEN  --    < > 1.5  --  4.3* 2.7*  --  1.7  --    < > = values in this interval not displayed.    Liver Function Tests: Recent Labs  Lab 10/06/19 1240  10/07/19 0429 10/08/19 0531 10/09/19 0459 10/10/19 0419  AST 64* 56* 2,353* 995* 293*  ALT 84* 59* 885* 794* 481*  ALKPHOS 118 99 98 109 114  BILITOT 1.5* 0.5 0.9 1.2 1.2  PROT 6.7 5.4* 5.4* 5.0* 4.6*  ALBUMIN 2.8* 2.0* 2.0* 1.8* 1.5*   No results for input(s): LIPASE, AMYLASE in the last 168 hours. No results for input(s): AMMONIA in the last 168 hours.  ABG    Component Value Date/Time   PHART 7.453 (H) 10/08/2019 1533   PO2ART 102 10/08/2019 1533   HCO3 12.3 (L) 10/08/2019 1533   ACIDBASEDEF 11.0 (H) 10/08/2019 1533   O2SAT 97.3 10/08/2019 1533     Coagulation Profile: Recent Labs  Lab 10/06/19 1240 10/08/19 1215 10/09/19 1102  INR 1.2 1.3* 1.3*    Cardiac Enzymes: Recent Labs  Lab 10/09/19 0633  CKTOTAL 41    HbA1C: Hgb A1c MFr Bld  Date/Time Value Ref Range Status  10/07/2019 04:29 AM 16.4 (H) 4.8 - 5.6 % Final    Comment:    (NOTE) Pre diabetes:          5.7%-6.4% Diabetes:              >6.4% Glycemic control for   <7.0% adults with diabetes   08/29/2018 04:55 PM >15.5 (H) 4.8 - 5.6 % Final    Comment:    **Verified by repeat analysis**          Prediabetes: 5.7 - 6.4          Diabetes: >6.4          Glycemic control for adults with diabetes: <7.0     CBG: Recent Labs  Lab 10/09/19 1640 10/09/19 2019 10/10/19 0042 10/10/19 0424 10/10/19 0742  GLUCAP 95 178* 88 112* 122*   PCCM will sign off. Thank you for the opportunity to participate in this patient's care. Please contact if we can be of further assistance.  Francine Graven, MSN, AGACNP  [facility]

## 2019-10-10 NOTE — Progress Notes (Signed)
Daily Rounding Note  10/10/2019, 1:56 PM  LOS: 4 days   SUBJECTIVE:   Chief complaint: cholangitis Denied abdominal pain to MD late this AM. Feeling better overall.  No n/v     OBJECTIVE:         Vital signs in last 24 hours:    Temp:  [97.3 F (36.3 C)-98.2 F (36.8 C)] 97.8 F (36.6 C) (10/28 0744) Pulse Rate:  [58-68] 64 (10/28 1345) Resp:  [12-28] 21 (10/28 1345) BP: (84-114)/(51-96) 89/66 (10/28 1345) SpO2:  [95 %-99 %] 98 % (10/28 1345) Last BM Date: 10/09/19 Filed Weights   10/06/19 1503 10/07/19 0142 10/08/19 0641  Weight: 54.4 kg 45.2 kg 47.7 kg   General: sleeping comfortably in bed.  Did not re-examine   Heart: NSR on monitor Chest: non-labored breathing Abdomen: not distended  Neuro/Psych:  Sleeping.    Intake/Output from previous day: 10/27 0701 - 10/28 0700 In: 2544.2 [I.V.:2044; IV Piggyback:500.2] Out: 250 [Urine:250]  Intake/Output this shift: Total I/O In: 696 [I.V.:596; IV Piggyback:100] Out: -   Lab Results: Recent Labs    10/08/19 0531 10/09/19 0459 10/10/19 0419  WBC 10.4 12.9* 20.6*  HGB 10.6* 10.5* 13.0  HCT 29.8* 29.7* 36.2  PLT 43* 33* 60*   BMET Recent Labs    10/08/19 1603 10/09/19 0459 10/10/19 0419  NA 135 137 137  K 3.7 4.0 4.5  CL 111 115* 112*  CO2 12* 15* 15*  GLUCOSE 205* 129* 118*  BUN 23 25* 30*  CREATININE 0.83 0.68 0.72  CALCIUM 7.9* 7.8* 7.8*   LFT Recent Labs    10/08/19 0531 10/09/19 0459 10/10/19 0419  PROT 5.4* 5.0* 4.6*  ALBUMIN 2.0* 1.8* 1.5*  AST 2,353* 995* 293*  ALT 885* 794* 481*  ALKPHOS 98 109 114  BILITOT 0.9 1.2 1.2  BILIDIR 0.2 0.3* 0.3*  IBILI 0.7 0.9 0.9   PT/INR Recent Labs    10/08/19 1215 10/09/19 1102  LABPROT 16.0* 16.4*  INR 1.3* 1.3*   Hepatitis Panel Recent Labs    10/08/19 1215  HEPBSAG NON REACTIVE  HCVAB NON REACTIVE  HEPAIGM NON REACTIVE  HEPBIGM NON REACTIVE    Studies/Results: Nm  Hepatobiliary Liver Func  Result Date: 10/09/2019 CLINICAL DATA:  Upper abdominal pain and abnormal appearing gallbladder on recent ultrasound examination EXAM: NUCLEAR MEDICINE HEPATOBILIARY IMAGING TECHNIQUE: Sequential images of the abdomen were obtained out to 80 minutes following intravenous administration of radiopharmaceutical. RADIOPHARMACEUTICALS:  5.3 mCi Tc-2m  Choletec IV COMPARISON:  Ultrasound right upper quadrant October 08, 2019 FINDINGS: Liver uptake of radiotracer is unremarkable. There is visualization of small bowel indicating patency of the common bile duct. Gallbladder visualizes, although somewhat delayed compared to visualization of the small bowel. IMPRESSION: There is visualization of the gallbladder which does indicate patency of the cystic duct. Relative delay in visualization of the gallbladder compared to small bowel may be a finding indicative of chronic cholecystitis. Common bile duct is patent as is evidenced by visualization of the small bowel. Comment: Note that radiotracer is seen in the right upper extremity due to apparent infiltration of intravenous access site earlier in the course of the study. Electronically Signed   By: Lowella Grip III M.D.   On: 10/09/2019 16:58   US Abdomen Limited  Result Date: 10/08/2019 CLINICAL DATA:  Ascending cholangitis.  Abnormal CT. EXAM: ULTRASOUND ABDOMEN LIMITED RIGHT UPPER QUADRANT COMPARISON:  None. FINDINGS: Gallbladder: There is gallbladder wall thickening with the  gallbladder measuring approximately 3-4 mm. There is pericholecystic free fluid. Gallbladder sludge is noted. The sonographic Eulah PontMurphy sign was not reliably evaluated secondary to patient condition. Common bile duct: Diameter: 3 mm Liver: There is a 5 x 2.5 x 4.8 cm collection in the right hepatic lobe. This collection demonstrates no significant internal color Doppler flow. Portal vein is patent on color Doppler imaging with normal direction of blood flow towards  the liver. Other: There is a right-sided pleural effusion. IMPRESSION: 1. Gallbladder sludge with pericholecystic free fluid and gallbladder wall thickening. Findings could represent acute cholecystitis in the appropriate clinical setting. The sonographic Eulah PontMurphy sign could not be reliably assessed. Further evaluation can be performed with HIDA scan as clinically indicated. 2. Complex collection in the right hepatic lobe as detailed above, again concerning for developing intrahepatic abscess in the appropriate clinical setting. 3. Incidentally noted right-sided pleural effusion. Electronically Signed   By: Katherine Mantlehristopher  Green M.D.   On: 10/08/2019 21:02   Dg Chest Port 1 View  Result Date: 10/08/2019 CLINICAL DATA:  Hypoxia and shortness of breath. EXAM: PORTABLE CHEST 1 VIEW COMPARISON:  10/07/2019 and 10/06/2019 FINDINGS: Left-sided pacemaker unchanged. Lungs are adequately inflated demonstrate continued mild patchy bilateral airspace process with slight worsening over the left midlung and left upper lobe. No effusion. Cardiomediastinal silhouette and remainder of the exam is unchanged. IMPRESSION: Slight interval worsening bilateral patchy airspace process likely multifocal infection. Electronically Signed   By: Elberta Fortisaniel  Boyle M.D.   On: 10/08/2019 16:00   Scheduled Meds: . chlorhexidine  15 mL Mouth Rinse BID  . Chlorhexidine Gluconate Cloth  6 each Topical Daily  . fentaNYL      . flecainide  50 mg Oral BID  . insulin aspart  0-20 Units Subcutaneous Q4H  . insulin glargine  10 Units Subcutaneous Daily  . lidocaine      . mouth rinse  15 mL Mouth Rinse q12n4p  . midazolam      . pantoprazole (PROTONIX) IV  40 mg Intravenous Q24H   Continuous Infusions: . sodium chloride Stopped (10/09/19 0959)  . lactated ringers 100 mL/hr at 10/10/19 0600  . meropenem (MERREM) IV 1 g (10/10/19 1014)   PRN Meds:.sodium chloride, acetaminophen, dextrose   ASSESMENT:   *    Cholangitis with associated  Klebsiella sepsis. HIDA shows visualization of gallbladder, patent cystic duct, patent CBD.  Relative delay in gallbladder visualization compared to small bowel may be indicative of chronic cholecystitis. Day 3 Meropenem.   LFTs continue to improve. WBCs increasing 12.9 >> 20.6 in the last 24 hours.  Last assay it was prior to abscess aspiration. Hep B core IgM negative.  Hep B core total Ab positive.  Hep B surface Ab positive. Likely resolved infection.  Hep B surface Ag negative Hep A IgM negative.     Hep C Ab negative.   Pt reports prior hx of hepatitis but knows no details.    *    Liver abscesses.  10 mL purulent fluid aspiration and sent to lab.  Fluid collections decompressed after aspiration, no well-formed fluid collection identified after aspiration, no drain placed.  *     New perihepatic ascites.  220 mL paracentesis, fluid stent for culture.  *    Pacemaker 2018.  This is not FDA approved for MRI in the [place], therefore MRCP canceled.  *    Thrombocytopenia, improved.  *   DKA.     PLAN   *  Advance clears >>  Carb  mod as tolerated.    *   Added cell count and diff to ascites labs.      Jennye Moccasin  10/10/2019, 1:56 PM Phone [phone]

## 2019-10-10 NOTE — Consult Note (Signed)
Chief Complaint: Patient was seen in consultation today for hepatic abscess aspiration/drain placement Chief Complaint  Patient presents with   AMS    Referring Physician(s): Dr Deland Pretty  Supervising Physician: Markus Daft  Patient Status: [name] - [facility]  History of Present Illness: Courtney [name] is a 73 y.o. female   Spoke to pt through [place] interpreter machine  Abd pain; AMS Presented to ED 10/06/19 Bacteremia: Klebsiella PNA Poss UTI  CT 10/25: IMPRESSION: 1. Small bilateral pleural effusions and findings of multifocal pneumonia. Clinical correlation is recommended. 2. Diffuse gallbladder wall edema and pericholecystic fluid. Additionally there is periportal edema and findings concerning for developing intrahepatic abscesses. Findings may represent cholangitis. Clinical correlation and further evaluation with MRCP is recommended. 3. Underdistention of the colon versus less likely mild colitis. Clinical correlation is recommended. No bowel obstruction. Normal appendix.  HIDA yesterday:  IMPRESSION: There is visualization of the gallbladder which does indicate patency of the cystic duct. Relative delay in visualization of the gallbladder compared to small bowel may be a finding indicative of chronic cholecystitis. Common bile duct is patent as is evidenced by visualization of the small bowel.  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  Medications: Prior to Admission medications   Medication Sig Start Date End Date Taking? Authorizing Provider  acetaminophen (TYLENOL) 325 MG tablet Take 1-2 tablets (325-650 mg total) by mouth every 6 (six) hours as needed for mild pain or moderate pain. 09/06/17  Yes Baldwin [place], PA-C  apixaban (ELIQUIS) 5 MG TABS tablet Take 1 tablet (5 mg total) by mouth 2 (two) times daily. 07/26/19  Yes Deboraha Sprang, MD  flecainide (TAMBOCOR) 50 MG tablet Take 1 tablet (50 mg total) by mouth 2 (two) times daily. 07/26/19  Yes Deboraha Sprang, MD  metoprolol tartrate (LOPRESSOR) 50 MG tablet Take 1 tablet (50 mg total) by mouth 2 (two) times daily. Please keep upcoming appt in September with Dr. Caryl Comes before anymore refills. Thank you 08/31/19  Yes Deboraha Sprang, MD  glucose monitoring kit (FREESTYLE) monitoring kit Check cbgs bid as instructed by pcp. 01/24/16   Shawnee Knapp, MD     Family History  Problem Relation Age of Onset   Diabetes Son     Social History   Socioeconomic History   Marital status: Single    Spouse name: Not on file   Number of children: Not on file   Years of education: Not on file   Highest education level: Not on file  Occupational History   Not on file  Social Needs   Financial resource strain: Not on file   Food insecurity    Worry: Not on file    Inability: Not on file   Transportation needs    Medical: Not on file  Non-medical: Not on file  Tobacco Use   Smoking status: Current Every Day Smoker   Smokeless tobacco: Never Used  Substance and Sexual Activity   Alcohol use: No    Frequency: Never   Drug use: No   Sexual activity: Not on file  Lifestyle   Physical activity    Days per week: Not on file    Minutes per session: Not on file     Stress: Not on file  Relationships   Social connections    Talks on phone: Not on file    Gets together: Not on file    Attends religious service: Not on file    Active member of club or organization: Not on file    Attends meetings of clubs or organizations: Not on file    Relationship status: Not on file  Other Topics Concern   Not on file  Social History Narrative   Not on file    Review of Systems: A 12 point ROS discussed and pertinent positives are indicated in the HPI above.  All other systems are negative.  Review of Systems  Constitutional: Positive for activity change, fatigue and fever.  Respiratory: Negative for cough and shortness of breath.   Cardiovascular: Negative for chest pain.  Gastrointestinal: Positive for abdominal pain and nausea.  Psychiatric/Behavioral: Negative for behavioral problems and confusion.    Vital Signs: BP 93/65    Pulse 64    Temp 97.8 F (36.6 C) (Oral)    Resp 19    Ht _0  (1.549 m)    Wt 105 lb 2.6 oz (47.7 kg)    SpO2 96%    BMI 19.87 kg/m   Physical Exam Vitals signs reviewed.  Cardiovascular:     Rate and Rhythm: Normal rate and regular rhythm.     Heart sounds: Normal heart sounds.  Pulmonary:     Breath sounds: Normal breath sounds.  Abdominal:     Tenderness: There is no abdominal tenderness.  Musculoskeletal: Normal range of motion.  Skin:    General: Skin is warm and dry.  Neurological:     Mental Status: She is oriented to person, place, and time.  Psychiatric:     Comments: Spoke to  Pt via Interpreter -- machine  Spoke to son via phone  Consent signed in chart     Imaging: Ct Head Wo Contrast  Result Date: 10/06/2019 CLINICAL DATA:  Altered level of consciousness and fever EXAM: CT HEAD WITHOUT CONTRAST TECHNIQUE: Contiguous axial images were obtained from the base of the skull through the vertex without intravenous contrast. COMPARISON:  None. FINDINGS: Brain: The ventricles are normal in size and  configuration. There is no intracranial mass, hemorrhage, extra-axial fluid collection, or midline shift. Brain parenchyma appears unremarkable. No acute infarct evident. Vascular: There is no hyperdense vessel. There is calcification in each carotid siphon region. Skull: The bony calvarium appears intact. Sinuses/Orbits: There is mucosal thickening in several ethmoid air cells. Other visualized paranasal sinuses are clear. Visualized orbits appear symmetric bilaterally. Other: Mastoid air cells are clear. IMPRESSION: Brain parenchyma appears unremarkable. No evident acute infarct. No mass or hemorrhage. There are foci of arterial vascular calcification. There is mucosal thickening in several ethmoid air cells. Electronically Signed   By: Lowella Grip III M.D.   On: 10/06/2019 14:55   Nm Hepatobiliary Liver Func  Result Date: 10/09/2019 CLINICAL DATA:  Upper abdominal pain and abnormal appearing gallbladder on recent ultrasound examination EXAM: NUCLEAR MEDICINE HEPATOBILIARY IMAGING TECHNIQUE:  Sequential images of the abdomen were obtained out to 80 minutes following intravenous administration of radiopharmaceutical. RADIOPHARMACEUTICALS:  5.3 mCi Tc-23m Choletec IV COMPARISON:  Ultrasound right upper quadrant October 08, 2019 FINDINGS: Liver uptake of radiotracer is unremarkable. There is visualization of small bowel indicating patency of the common bile duct. Gallbladder visualizes, although somewhat delayed compared to visualization of the small bowel. IMPRESSION: There is visualization of the gallbladder which does indicate patency of the cystic duct. Relative delay in visualization of the gallbladder compared to small bowel may be a finding indicative of chronic cholecystitis. Common bile duct is patent as is evidenced by visualization of the small bowel. Comment: Note that radiotracer is seen in the right upper extremity due to apparent infiltration of intravenous access site earlier in the course  of the study. Electronically Signed   By: WLowella GripIII M.D.   On: 10/09/2019 16:58   Ct Abdomen Pelvis W Contrast  Result Date: 10/08/2019 CLINICAL DATA:  73year old female with bacteremia. EXAM: CT ABDOMEN AND PELVIS WITH CONTRAST TECHNIQUE: Multidetector CT imaging of the abdomen and pelvis was performed using the standard protocol following bolus administration of intravenous contrast. CONTRAST:  1033mOMNIPAQUE IOHEXOL 300 MG/ML  SOLN COMPARISON:  None. FINDINGS: Evaluation of this exam is limited due to respiratory motion artifact. Lower chest: Partially visualized small bilateral pleural effusions with associated partial compressive atelectasis of the lower lobes. Multiple patchy areas of airspace consolidation in the visualized lung fields most consistent with multifocal pneumonia. Several peripheral and subpleural airspace opacity may have central cavitation (series 3, image 1) and may represent fungal infection, abscesses, or septic emboli. Clinical correlation is recommended. Cardiac pacemaker wires noted. No intra-abdominal free air. There is a small free fluid within the pelvis as well as a small perihepatic free fluid. Hepatobiliary: There is heterogeneous enhancement of the liver. Two adjacent hypoenhancing areas noted in the right lobe of the liver each measuring approximately 2.4 x 2.4 cm concerning for areas of infection or developing abscesses. There is mild periportal edema. There is diffuse thickening of the gallbladder wall with pericholecystic fluid. No definite calcified stone identified. Evaluation of the gallbladder is very limited due to respiratory motion artifact. Sludge or noncalcified stone may be present within the gallbladder. Further evaluation with ultrasound or MRCP is recommended. Pancreas: Unremarkable. No pancreatic ductal dilatation or surrounding inflammatory changes. Spleen: Normal in size without focal abnormality. Adrenals/Urinary Tract: The adrenal glands  are unremarkable. There is no hydronephrosis on either side. There is symmetric enhancement and excretion of contrast by both kidneys. The visualized ureters and urinary bladder appear unremarkable. Stomach/Bowel: There is no bowel obstruction. Mild diffuse thickened appearance of the colonic wall may be related to underdistention. Clinical correlation is recommended to evaluate for mild colitis. The appendix is normal. Vascular/Lymphatic: Mild aortoiliac atherosclerotic disease. The IVC is unremarkable. No portal venous gas. Mildly enlarged periportal lymph nodes, reactive. Reproductive: The uterus is grossly unremarkable. No adnexal masses. Other: There is diffuse subcutaneous edema. No fluid collection. Musculoskeletal: Degenerative changes of the spine. No acute osseous pathology. IMPRESSION: 1. Small bilateral pleural effusions and findings of multifocal pneumonia. Clinical correlation is recommended. 2. Diffuse gallbladder wall edema and pericholecystic fluid. Additionally there is periportal edema and findings concerning for developing intrahepatic abscesses. Findings may represent cholangitis. Clinical correlation and further evaluation with MRCP is recommended. 3. Underdistention of the colon versus less likely mild colitis. Clinical correlation is recommended. No bowel obstruction. Normal appendix. Aortic Atherosclerosis (ICD10-I70.0). Electronically Signed  By: Anner Crete M.D.   On: 10/08/2019 00:18   US Abdomen Limited  Result Date: 10/08/2019 CLINICAL DATA:  Ascending cholangitis.  Abnormal CT. EXAM: ULTRASOUND ABDOMEN LIMITED RIGHT UPPER QUADRANT COMPARISON:  None. FINDINGS: Gallbladder: There is gallbladder wall thickening with the gallbladder measuring approximately 3-4 mm. There is pericholecystic free fluid. Gallbladder sludge is noted. The sonographic Percell Miller sign was not reliably evaluated secondary to patient condition. Common bile duct: Diameter: 3 mm Liver: There is a 5 x 2.5 x 4.8  cm collection in the right hepatic lobe. This collection demonstrates no significant internal color Doppler flow. Portal vein is patent on color Doppler imaging with normal direction of blood flow towards the liver. Other: There is a right-sided pleural effusion. IMPRESSION: 1. Gallbladder sludge with pericholecystic free fluid and gallbladder wall thickening. Findings could represent acute cholecystitis in the appropriate clinical setting. The sonographic Percell Miller sign could not be reliably assessed. Further evaluation can be performed with HIDA scan as clinically indicated. 2. Complex collection in the right hepatic lobe as detailed above, again concerning for developing intrahepatic abscess in the appropriate clinical setting. 3. Incidentally noted right-sided pleural effusion. Electronically Signed   By: Constance Holster M.D.   On: 10/08/2019 21:02   Dg Chest Port 1 View  Result Date: 10/08/2019 CLINICAL DATA:  Hypoxia and shortness of breath. EXAM: PORTABLE CHEST 1 VIEW COMPARISON:  10/07/2019 and 10/06/2019 FINDINGS: Left-sided pacemaker unchanged. Lungs are adequately inflated demonstrate continued mild patchy bilateral airspace process with slight worsening over the left midlung and left upper lobe. No effusion. Cardiomediastinal silhouette and remainder of the exam is unchanged. IMPRESSION: Slight interval worsening bilateral patchy airspace process likely multifocal infection. Electronically Signed   By: Marin Olp M.D.   On: 10/08/2019 16:00   Dg Chest Port 1 View  Result Date: 10/07/2019 CLINICAL DATA:  Shortness of breath EXAM: PORTABLE CHEST 1 VIEW COMPARISON:  10/06/2019 FINDINGS: The heart size and mediastinal contours are within normal limits. Left chest multi lead pacer. Increased multifocal heterogeneous opacity of the bilateral mid lungs and perihilar right lung. The visualized skeletal structures are unremarkable. IMPRESSION: Increased multifocal heterogeneous opacity of the  bilateral mid lungs and perihilar right lung, concerning for multifocal infection. Electronically Signed   By: Eddie Candle M.D.   On: 10/07/2019 19:26   Dg Chest Port 1 View  Result Date: 10/06/2019 CLINICAL DATA:  Altered mental status EXAM: PORTABLE CHEST 1 VIEW COMPARISON:  09/06/2017 FINDINGS: There is no focal consolidation. There is no pleural effusion or pneumothorax. The heart and mediastinal contours are unremarkable. 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 There is thoracic aortic atherosclerosis. There is no acute osseous abnormality. IMPRESSION: No active disease. Electronically Signed   By: Kathreen Devoid   On: 10/06/2019 12:49   [place] Ekg Site Rite  Result Date: 10/07/2019 If Site Rite image not attached, placement could not be confirmed due to current cardiac rhythm.   Labs:  CBC: Recent Labs    10/07/19 0429 10/08/19 0531 10/09/19 0459 10/10/19 0419  WBC 7.3 10.4 12.9* 20.6*  HGB 10.5* 10.6* 10.5* 13.0  HCT 30.4* 29.8* 29.7* 36.2  PLT 55* 43* 33* 60*    COAGS: Recent Labs    10/06/19 1240 10/08/19 1215 10/09/19 1102  INR 1.2 1.3* 1.3*  APTT 38*  --   --     BMP: Recent Labs    10/08/19 0531 10/08/19 1603 10/09/19 0459 10/10/19 0419  NA 134* 135 137 137  K 3.4* 3.7  4.0 4.5  CL 109 111 115* 112*  CO2 14* 12* 15* 15*  GLUCOSE 162* 205* 129* 118*  BUN 24* 23 25* 30*  CALCIUM 7.8* 7.9* 7.8* 7.8*  CREATININE 0.76 0.83 0.68 0.72  GFRNONAA >60 >60 >60 >60  GFRAA >60 >60 >60 >60    LIVER FUNCTION TESTS: Recent Labs    10/07/19 0429 10/08/19 0531 10/09/19 0459 10/10/19 0419  BILITOT 0.5 0.9 1.2 1.2  AST 56* 2,353* 995* 293*  ALT 59* 885* 794* 481*  ALKPHOS 99 98 109 114  PROT 5.4* 5.4* 5.0* 4.6*  ALBUMIN 2.0* 2.0* 1.8* 1.5*    TUMOR MARKERS: No results for input(s): AFPTM, CEA, CA199, CHROMGRNA in the last 8760 hours.  Assessment and Plan:  Hepatic abscess Sepsis Bacteremia Leukocytosis Hida scan- no cholecystitis; NO indication for  perc chole drain Scheduled now for hepatic abscess aspiration/drain placement Risks and benefits discussed with the patient via [place] including bleeding, infection, damage to adjacent structures, bowel perforation/fistula connection, and sepsis. Also spoke to Courtney [name] via phone--- agreeable to proceed  All of the patient's and sons questions were answered, they are agreeable to proceed. Consent signed and in chart.   Thank you for this interesting consult.  I greatly enjoyed meeting Courtney [name] and look forward to participating in their care.  A copy of this report was sent to the requesting provider on this date.  Electronically Signed: Lavonia Drafts, PA-C 10/10/2019, 9:10 AM   I spent a total of 40 Minutes    in face to face in clinical consultation, greater than 50% of which was counseling/coordinating care for hepatic abscess aspiration/drain placement

## 2019-10-10 NOTE — Procedures (Signed)
Interventional Radiology Procedure:   Indications: Hepatic ascites and cholangitis  Procedure: US guided paracentesis and US guided hepatic abscess aspiration  Findings: New peri-hepatic ascites.  Paracentesis yielded 220 ml of yellow fluid (sent for culture).  Irregular hypoechoic collections in liver.  Aspirated 10 ml of tan purulent fluid.  Collections decompressed after aspiration and no well formed collection identified when a wire was placed.  No drain placed.   Complications: None     EBL: less than 20 ml  Plan: Follow cultures of peritoneal fluid and hepatic abscess.    Jamille Yoshino R. Anselm Pancoast, MD  Pager: [phone]

## 2019-10-10 NOTE — Progress Notes (Signed)
Pts total urine output this shift is only 125.  Bladder scan showing 33. Output overnight was 250.  Pt has been receiving LR at 156ml/hr. Spoke w/ Dr. Maylene [name] to relay.  Received verbal orders to increase LR to 125.  Will monitor for now, recheck creatinine tomorrow AM.

## 2019-10-10 NOTE — Consult Note (Signed)
[facility]  Total days of antibiotics 5/3 day of meropenem               Reason for Consult:disseminated klebsiella infection    Referring Physician: Alvino Chapelchoi  Active Problems:   Altered mental status   AMS (altered mental status)    HPI: Courtney [name] is a 73 y.o. female with hx of DM,tachy-brady syndrome s/p pacemaker, afib who was admitted fever, AMS, found to be in DKA. She was started on meningitis abtx coverage. On admit, she had wbc of 15K with 8% bandemia. Infectious work-up found to have klebsiella pneumonia bacteremia in addition to liver abscess with associated perihepatic ascites. She has 10mL aspiration of the 2.4cm by 2.4cm liver abscess as well as 220mL paracentesis done today. She did also have urine cx showing 30K of ESBL ecoli, but unclear if symptomatic. Her WBC in the last 24hr trending up to 20K. cxr also shows patchy infiltrate concerning for multifocal pneumonia, currently on supplemental oxygen  Past Medical History:  Diagnosis Date   Allergy    Diabetes mellitus type 2, diet-controlled (HCC)    PAF (paroxysmal atrial fibrillation) (HCC) 09/04/2017   Sinus bradycardia 09/04/2017   HR 30s at times   Tachy-brady syndrome (HCC) 09/04/2017    Allergies: No Known Allergies   MEDICATIONS:  chlorhexidine  15 mL Mouth Rinse BID   Chlorhexidine Gluconate Cloth  6 each Topical Daily   fentaNYL       flecainide  50 mg Oral BID   insulin aspart  0-20 Units Subcutaneous Q4H   insulin glargine  10 Units Subcutaneous Daily   lidocaine       mouth rinse  15 mL Mouth Rinse q12n4p   midazolam       pantoprazole (PROTONIX) IV  40 mg Intravenous Q24H    Social History   Tobacco Use   Smoking status: Current Every Day Smoker   Smokeless tobacco: Never Used  Substance Use Topics   Alcohol use: No    Frequency: Never   Drug use: No    Family History  Problem Relation Age of Onset   Diabetes Son     Review of Systems -   Fatigue, abdominal pain, feverish. Otherwise 12 point ros is negative   OBJECTIVE: Temp:  [97.3 F (36.3 C)-98.2 F (36.8 C)] 97.4 F (36.3 C) (10/28 1600) Pulse Rate:  [58-68] 60 (10/28 1530) Resp:  [12-26] 18 (10/28 1530) BP: (84-114)/(51-96) 102/66 (10/28 1530) SpO2:  [95 %-99 %] 97 % (10/28 1530) Physical Exam  Constitutional:  oriented to person, place, and time. appears well-developed and well-nourished. No distress.  HENT: /AT, PERRLA, no scleral icterus Mouth/Throat: Oropharynx is clear and moist. No oropharyngeal exudate.  Cardiovascular: Normal rate, regular rhythm and normal heart sounds. Exam reveals no gallop and no friction rub.  No murmur heard.  Pulmonary/Chest: Effort normal and breath sounds normal. No respiratory distress.  has no wheezes.  Neck = supple, no nuchal rigidity Abdominal: Soft. Bowel sounds are normal.  exhibits no distension. mild tenderness. Bandage to right lower quadrant Lymphadenopathy: no cervical adenopathy. No axillary adenopathy Neurological: alert and oriented to person, place, and time.  Skin: Skin is warm and dry. No rash noted. No erythema.  Psychiatric: a normal mood and affect.  behavior is normal.    LABS: Results for orders placed or performed during the hospital encounter of 10/06/19 (from the past 48 hour(s))  Glucose, capillary     Status: Abnormal  Collection Time: 10/08/19  5:44 PM  Result Value Ref Range   Glucose-Capillary 232 (H) 70 - 99 mg/dL  Glucose, capillary     Status: Abnormal   Collection Time: 10/08/19  6:39 PM  Result Value Ref Range   Glucose-Capillary 200 (H) 70 - 99 mg/dL  Glucose, capillary     Status: Abnormal   Collection Time: 10/08/19  7:43 PM  Result Value Ref Range   Glucose-Capillary 173 (H) 70 - 99 mg/dL  Lactic acid, plasma     Status: Abnormal   Collection Time: 10/08/19 10:36 PM  Result Value Ref Range   Lactic Acid, Venous 2.7 (HH) 0.5 - 1.9 mmol/L    Comment: CRITICAL VALUE NOTED.   VALUE IS CONSISTENT WITH PREVIOUSLY REPORTED AND CALLED VALUE. Performed at [facility], [address], [place], [place] [address]   MRSA PCR Screening     Status: None   Collection Time: 10/08/19 10:50 PM   Specimen: Nasal Mucosa; Nasopharyngeal  Result Value Ref Range   MRSA by PCR NEGATIVE NEGATIVE    Comment:        The GeneXpert MRSA Assay (FDA approved for NASAL specimens only), is one component of a comprehensive MRSA colonization surveillance program. It is not intended to diagnose MRSA infection nor to guide or monitor treatment for MRSA infections. Performed at [facility], [address], [place], [place] [address]   Glucose, capillary     Status: Abnormal   Collection Time: 10/08/19 11:31 PM  Result Value Ref Range   Glucose-Capillary 108 (H) 70 - 99 mg/dL  Glucose, capillary     Status: Abnormal   Collection Time: 10/09/19  4:27 AM  Result Value Ref Range   Glucose-Capillary 122 (H) 70 - 99 mg/dL  Basic metabolic panel     Status: Abnormal   Collection Time: 10/09/19  4:59 AM  Result Value Ref Range   Sodium 137 135 - 145 mmol/L   Potassium 4.0 3.5 - 5.1 mmol/L   Chloride 115 (H) 98 - 111 mmol/L   CO2 15 (L) 22 - 32 mmol/L   Glucose, Bld 129 (H) 70 - 99 mg/dL   BUN 25 (H) 8 - 23 mg/dL   Creatinine, Ser 5.62 0.44 - 1.00 mg/dL   Calcium 7.8 (L) 8.9 - 10.3 mg/dL   GFR calc non Af Amer >60 >60 mL/min   GFR calc Af Amer >60 >60 mL/min   Anion gap 7 5 - 15    Comment: Performed at [facility], [address], [place], [place] [address]  CBC     Status: Abnormal   Collection Time: 10/09/19  4:59 AM  Result Value Ref Range   WBC 12.9 (H) 4.0 - 10.5 K/uL   RBC 3.37 (L) 3.87 - 5.11 MIL/uL   Hemoglobin 10.5 (L) 12.0 - 15.0 g/dL   HCT 57.8 (L) 46.9 - 62.9 %   MCV 88.1 80.0 - 100.0 fL   MCH 31.2 26.0 - 34.0 pg   MCHC 35.4 30.0 - 36.0 g/dL   RDW 52.8 41.3 - 24.4 %   Platelets 33 (L) 150 - 400 K/uL    Comment: REPEATED TO VERIFY Immature  Platelet Fraction may be clinically indicated, consider ordering this additional test [id number] CONSISTENT WITH PREVIOUS RESULT    nRBC 0.2 0.0 - 0.2 %    Comment: Performed at [facility], [address], [place], [place] [address]  Hepatic function panel  Status: Abnormal   Collection Time: 10/09/19  4:59 AM  Result Value Ref Range   Total Protein 5.0 (L) 6.5 - 8.1 g/dL   Albumin 1.8 (L) 3.5 - 5.0 g/dL   AST 161 (H) 15 - 41 U/L   ALT 794 (H) 0 - 44 U/L   Alkaline Phosphatase 109 38 - 126 U/L   Total Bilirubin 1.2 0.3 - 1.2 mg/dL   Bilirubin, Direct 0.3 (H) 0.0 - 0.2 mg/dL   Indirect Bilirubin 0.9 0.3 - 0.9 mg/dL    Comment: Performed at [facility], [address], [place], [place] [address]  Magnesium     Status: None   Collection Time: 10/09/19  4:59 AM  Result Value Ref Range   Magnesium 1.9 1.7 - 2.4 mg/dL    Comment: Performed at [facility], [address], [place], [place] [address]  CK     Status: None   Collection Time: 10/09/19  6:33 AM  Result Value Ref Range   Total CK 41 38 - 234 U/L    Comment: Performed at [facility], [address], [place], [place] [address]  Hepatitis B core antibody, total     Status: Abnormal   Collection Time: 10/09/19  6:33 AM  Result Value Ref Range   Hep B Core Total Ab Reactive (A) NON REACTIVE    Comment: Performed at [facility], [address], [place], [place] [address]  Hepatitis B surface antibody     Status: None   Collection Time: 10/09/19  6:33 AM  Result Value Ref Range   Hepatitis B-Post 11.7 Immunity>9.9 mIU/mL    Comment: (NOTE) **Verified by repeat analysis**  Status of Immunity                     Anti-HBs Level  ------------------                     -------------- Inconsistent with Immunity                   0.0 - 9.9 Consistent with Immunity                          >9.9 Performed At: [facility] [address] [place], [place] [id number] Jolene Schimke MD  QI:[id number]   Hepatitis A antibody, total     Status: Abnormal   Collection Time: 10/09/19  6:33 AM  Result Value Ref Range   hep A Total Ab Reactive (A) NON REACTIVE    Comment: Performed at [facility], [address] [place], [place] [address]  Glucose, capillary     Status: Abnormal   Collection Time: 10/09/19  8:19 AM  Result Value Ref Range   Glucose-Capillary 122 (H) 70 - 99 mg/dL  Lactic acid, plasma     Status: None   Collection Time: 10/09/19 11:02 AM  Result Value Ref Range   Lactic Acid, Venous 1.7 0.5 - 1.9 mmol/L    Comment: Performed at [facility], [address] [place], [place] [address]  Protime-INR     Status: Abnormal   Collection Time: 10/09/19 11:02 AM  Result Value Ref Range   Prothrombin Time 16.4 (H) 11.4 - 15.2 seconds   INR 1.3 (H) 0.8 - 1.2    Comment: (NOTE) INR goal varies based on device and disease states. Performed at [facility],  [address], [place], [place] [address]   Glucose, capillary     Status: Abnormal   Collection Time: 10/09/19 11:15 AM  Result Value Ref Range   Glucose-Capillary 107 (H) 70 - 99 mg/dL  Glucose, capillary     Status: None   Collection Time: 10/09/19  4:40 PM  Result Value Ref Range   Glucose-Capillary 95 70 - 99 mg/dL  Glucose, capillary     Status: Abnormal   Collection Time: 10/09/19  8:19 PM  Result Value Ref Range   Glucose-Capillary 178 (H) 70 - 99 mg/dL  Glucose, capillary     Status: None   Collection Time: 10/10/19 12:42 AM  Result Value Ref Range   Glucose-Capillary 88 70 - 99 mg/dL  CBC     Status: Abnormal   Collection Time: 10/10/19  4:19 AM  Result Value Ref Range   WBC 20.6 (H) 4.0 - 10.5 K/uL   RBC 4.15 3.87 - 5.11 MIL/uL   Hemoglobin 13.0 12.0 - 15.0 g/dL   HCT 19.1 47.8 - 29.5 %   MCV 87.2 80.0 - 100.0 fL   MCH 31.3 26.0 - 34.0 pg   MCHC 35.9 30.0 - 36.0 g/dL   RDW 62.1 30.8 - 65.7 %   Platelets 60 (L) 150 - 400 K/uL    Comment: Immature Platelet Fraction may  be clinically indicated, consider ordering this additional test [id number] CONSISTENT WITH PREVIOUS RESULT    nRBC 0.1 0.0 - 0.2 %    Comment: Performed at [facility], [address], [place], [place] [address]  Hepatic function panel     Status: Abnormal   Collection Time: 10/10/19  4:19 AM  Result Value Ref Range   Total Protein 4.6 (L) 6.5 - 8.1 g/dL   Albumin 1.5 (L) 3.5 - 5.0 g/dL   AST 244 (H) 15 - 41 U/L   ALT 481 (H) 0 - 44 U/L   Alkaline Phosphatase 114 38 - 126 U/L   Total Bilirubin 1.2 0.3 - 1.2 mg/dL   Bilirubin, Direct 0.3 (H) 0.0 - 0.2 mg/dL   Indirect Bilirubin 0.9 0.3 - 0.9 mg/dL    Comment: Performed at [facility], [address], [place], [place] [address]  Basic metabolic panel     Status: Abnormal   Collection Time: 10/10/19  4:19 AM  Result Value Ref Range   Sodium 137 135 - 145 mmol/L   Potassium 4.5 3.5 - 5.1 mmol/L   Chloride 112 (H) 98 - 111 mmol/L   CO2 15 (L) 22 - 32 mmol/L   Glucose, Bld 118 (H) 70 - 99 mg/dL   BUN 30 (H) 8 - 23 mg/dL   Creatinine, Ser 2.53 0.44 - 1.00 mg/dL   Calcium 7.8 (L) 8.9 - 10.3 mg/dL   GFR calc non Af Amer >60 >60 mL/min   GFR calc Af Amer >60 >60 mL/min   Anion gap 10 5 - 15    Comment: Performed at [facility], [address], [place], [place] [address]  Glucose, capillary     Status: Abnormal   Collection Time: 10/10/19  4:24 AM  Result Value Ref Range   Glucose-Capillary 112 (H) 70 - 99 mg/dL  Urinalysis, Routine w reflex microscopic     Status: Abnormal   Collection Time: 10/10/19  4:45 AM  Result Value Ref Range   Color, Urine YELLOW YELLOW   APPearance CLEAR CLEAR   Specific Gravity, Urine 1.021 1.005 - 1.030  pH 6.0 5.0 - 8.0   Glucose, UA 50 (A) NEGATIVE mg/dL   Hgb urine dipstick NEGATIVE NEGATIVE   Bilirubin Urine NEGATIVE NEGATIVE   Ketones, ur 5 (A) NEGATIVE mg/dL   Protein, ur NEGATIVE NEGATIVE mg/dL   Nitrite NEGATIVE NEGATIVE   Leukocytes,Ua NEGATIVE NEGATIVE    Comment:  Performed at [facility], [address] [place], [place] [address]  Glucose, capillary     Status: Abnormal   Collection Time: 10/10/19  7:42 AM  Result Value Ref Range   Glucose-Capillary 122 (H) 70 - 99 mg/dL  Lactate dehydrogenase (pleural or peritoneal fluid)     Status: Abnormal   Collection Time: 10/10/19  1:10 PM  Result Value Ref Range   LD, Fluid 43 (H) 3 - 23 U/L    Comment: (NOTE) Results should be evaluated in conjunction with serum values    Fluid Type-FLDH PERITONEAL     Comment: Performed at [facility], [address] [place], [place] [address] CORRECTED ON 10/28 AT 1431: PREVIOUSLY REPORTED AS ABDOMEN   Protein, pleural or peritoneal fluid     Status: None   Collection Time: 10/10/19  1:10 PM  Result Value Ref Range   Total protein, fluid <3.0 g/dL    Comment: RESULT REPEATED AND VERIFIED   Fluid Type-FTP PERITONEAL     Comment: Performed at [facility], [address] [place], [place] [address] CORRECTED ON 10/28 AT 1431: PREVIOUSLY REPORTED AS ABDOMEN   Glucose, pleural or peritoneal fluid     Status: None   Collection Time: 10/10/19  1:10 PM  Result Value Ref Range   Glucose, Fluid 110 mg/dL    Comment: (NOTE) No normal range established for this test Results should be evaluated in conjunction with serum values    Fluid Type-FGLU PERITONEAL      Comment: Performed at [facility], [address], [place], [place] [address] CORRECTED ON 10/28 AT 1431: PREVIOUSLY REPORTED AS ABDOMEN   Glucose, capillary     Status: None   Collection Time: 10/10/19  1:33 PM  Result Value Ref Range   Glucose-Capillary 98 70 - 99 mg/dL  Body fluid cell count with differential     Status: Abnormal (Preliminary result)   Collection Time: 10/10/19  2:20 PM  Result Value Ref Range   Fluid Type-FCT Peritoneal    Color, Fluid YELLOW (A) YELLOW   Appearance, Fluid CLEAR CLEAR   Total Nucleated Cell Count, Fluid 175 0 - 1,000 cu mm    Comment:  Performed at [facility], [address] [place], [place] [address]   Neutrophil Count, Fluid PENDING 0 - 25 %   Lymphs, Fluid PENDING %   Monocyte-Macrophage-Serous Fluid PENDING 50 - 90 %   Eos, Fluid PENDING %   Other Cells, Fluid PENDING %    MICRO: reviewed IMAGING: Nm Hepatobiliary Liver Func  Result Date: 10/09/2019 CLINICAL DATA:  Upper abdominal pain and abnormal appearing gallbladder on recent ultrasound examination EXAM: NUCLEAR MEDICINE HEPATOBILIARY IMAGING TECHNIQUE: Sequential images of the abdomen were obtained out to 80 minutes following intravenous administration of radiopharmaceutical. RADIOPHARMACEUTICALS:  5.3 mCi Tc-87m  Choletec IV COMPARISON:  Ultrasound right upper quadrant October 08, 2019 FINDINGS: Liver uptake of radiotracer is unremarkable. There is visualization of small bowel indicating patency of the common bile duct. Gallbladder visualizes, although somewhat delayed compared to visualization of the small bowel. IMPRESSION: There is visualization of the gallbladder which does indicate patency  of the cystic duct. Relative delay in visualization of the gallbladder compared to small bowel may be a finding indicative of chronic cholecystitis. Common bile duct is patent as is evidenced by visualization of the small bowel. Comment: Note that radiotracer is seen in the right upper extremity due to apparent infiltration of intravenous access site earlier in the course of the study. Electronically Signed   By: Bretta Bang III M.D.   On: 10/09/2019 16:58   US Abdomen Limited  Result Date: 10/08/2019 CLINICAL DATA:  Ascending cholangitis.  Abnormal CT. EXAM: ULTRASOUND ABDOMEN LIMITED RIGHT UPPER QUADRANT COMPARISON:  None. FINDINGS: Gallbladder: There is gallbladder wall thickening with the gallbladder measuring approximately 3-4 mm. There is pericholecystic free fluid. Gallbladder sludge is noted. The sonographic Eulah Pont sign was not reliably evaluated secondary  to patient condition. Common bile duct: Diameter: 3 mm Liver: There is a 5 x 2.5 x 4.8 cm collection in the right hepatic lobe. This collection demonstrates no significant internal color Doppler flow. Portal vein is patent on color Doppler imaging with normal direction of blood flow towards the liver. Other: There is a right-sided pleural effusion. IMPRESSION: 1. Gallbladder sludge with pericholecystic free fluid and gallbladder wall thickening. Findings could represent acute cholecystitis in the appropriate clinical setting. The sonographic Eulah Pont sign could not be reliably assessed. Further evaluation can be performed with HIDA scan as clinically indicated. 2. Complex collection in the right hepatic lobe as detailed above, again concerning for developing intrahepatic abscess in the appropriate clinical setting. 3. Incidentally noted right-sided pleural effusion. Electronically Signed   By: Katherine Mantle M.D.   On: 10/08/2019 21:02   Ir US Guide Bx Asp/drain  Result Date: 10/10/2019 INDICATION: 73 year old with bacteremia and concern for cholangitis and hepatic abscess. Cystic duct was patent on recent HIDA examination. EXAM: ULTRASOUND-GUIDED PARACENTESIS ULTRASOUND-GUIDED HEPATIC ABSCESS ASPIRATION MEDICATIONS: The patient is currently admitted to the hospital and receiving intravenous antibiotics. ANESTHESIA/SEDATION: Fentanyl 12.5 mcg IV; Versed 0.5 mg IV Moderate Sedation Time:  12 minutes The patient was continuously monitored during the procedure by the interventional radiology nurse under my direct supervision. COMPLICATIONS: None immediate. PROCEDURE: Informed written consent was obtained from the patient's son after a thorough discussion of the procedural risks, benefits and alternatives. All questions were addressed. Maximal Sterile Barrier Technique was utilized including caps, mask, sterile gowns, sterile gloves, sterile drape, hand hygiene and skin antiseptic. A timeout was performed prior  to the initiation of the procedure. Patient was placed supine on the interventional table. Ultrasound was used to evaluate the abdomen. New perihepatic ascites was identified. Skin was anesthetized with 1% lidocaine. Using ultrasound guidance, a 19 gauge Yueh catheter was directed into the perihepatic space and a paracentesis was performed. The paracentesis catheter was kept in place as attention was directed to the intrahepatic lesion. Ultrasound demonstrated irregular hypoechoic areas in liver concerning for intrahepatic abscess. After the perihepatic ascites had been removed, a new 19 gauge Yueh catheter was directed into the liver and into the hypoechoic hepatic area with ultrasound guidance. Tan colored purulent fluid was aspirated. Approximately 10 mL of purulent fluid was removed from the liver. Following the aspiration, there was no significant collection remaining. Stiff Amplatz wire was advanced into the liver but with the wire was not forming within a well-defined collection. Drain was not placed. Yueh catheter was removed. Small amount of bloody material was removed from the paracentesis catheter prior to removal. Bandage placed over the puncture sites. FINDINGS: New perihepatic ascites. Approximately 220 mL  of predominantly yellow fluid was removed from the perihepatic space. Small amount of blood was aspirated from the paracentesis catheter after the liver aspiration. Small irregular hypoechoic collections in the liver. Approximately 10 mL of purulent fluid was removed from these collections and findings are compatible with a poorly formed hepatic abscess. The hepatic abscess was decompressed following aspiration. IMPRESSION: 1. Ultrasound-guided aspiration of the intrahepatic abscess/abscesses. Total of 10 mL of purulent fluid was removed. There was not a well-defined collection within the liver based on wire placement. In addition, the liver abscess appeared to be completely decompressed by the end  of the procedure. Therefore, hepatic drain was not placed. In addition, there is concern about placing an intrahepatic drain in the setting of ascites. Abscess fluid was sent for culture. 2. Ultrasound-guided paracentesis. Approximately 220 mL of peritoneal fluid was removed. Fluid was sent for analysis. Electronically Signed   By: Richarda Overlie M.D.   On: 10/10/2019 15:12   Ir Paracentesis  Result Date: 10/10/2019 INDICATION: 73 year old with bacteremia and concern for cholangitis and hepatic abscess. Cystic duct was patent on recent HIDA examination. EXAM: ULTRASOUND-GUIDED PARACENTESIS ULTRASOUND-GUIDED HEPATIC ABSCESS ASPIRATION MEDICATIONS: The patient is currently admitted to the hospital and receiving intravenous antibiotics. ANESTHESIA/SEDATION: Fentanyl 12.5 mcg IV; Versed 0.5 mg IV Moderate Sedation Time:  12 minutes The patient was continuously monitored during the procedure by the interventional radiology nurse under my direct supervision. COMPLICATIONS: None immediate. PROCEDURE: Informed written consent was obtained from the patient's son after a thorough discussion of the procedural risks, benefits and alternatives. All questions were addressed. Maximal Sterile Barrier Technique was utilized including caps, mask, sterile gowns, sterile gloves, sterile drape, hand hygiene and skin antiseptic. A timeout was performed prior to the initiation of the procedure. Patient was placed supine on the interventional table. Ultrasound was used to evaluate the abdomen. New perihepatic ascites was identified. Skin was anesthetized with 1% lidocaine. Using ultrasound guidance, a 19 gauge Yueh catheter was directed into the perihepatic space and a paracentesis was performed. The paracentesis catheter was kept in place as attention was directed to the intrahepatic lesion. Ultrasound demonstrated irregular hypoechoic areas in liver concerning for intrahepatic abscess. After the perihepatic ascites had been removed, a  new 19 gauge Yueh catheter was directed into the liver and into the hypoechoic hepatic area with ultrasound guidance. Tan colored purulent fluid was aspirated. Approximately 10 mL of purulent fluid was removed from the liver. Following the aspiration, there was no significant collection remaining. Stiff Amplatz wire was advanced into the liver but with the wire was not forming within a well-defined collection. Drain was not placed. Yueh catheter was removed. Small amount of bloody material was removed from the paracentesis catheter prior to removal. Bandage placed over the puncture sites. FINDINGS: New perihepatic ascites. Approximately 220 mL of predominantly yellow fluid was removed from the perihepatic space. Small amount of blood was aspirated from the paracentesis catheter after the liver aspiration. Small irregular hypoechoic collections in the liver. Approximately 10 mL of purulent fluid was removed from these collections and findings are compatible with a poorly formed hepatic abscess. The hepatic abscess was decompressed following aspiration. IMPRESSION: 1. Ultrasound-guided aspiration of the intrahepatic abscess/abscesses. Total of 10 mL of purulent fluid was removed. There was not a well-defined collection within the liver based on wire placement. In addition, the liver abscess appeared to be completely decompressed by the end of the procedure. Therefore, hepatic drain was not placed. In addition, there is concern about placing an intrahepatic  drain in the setting of ascites. Abscess fluid was sent for culture. 2. Ultrasound-guided paracentesis. Approximately 220 mL of peritoneal fluid was removed. Fluid was sent for analysis. Electronically Signed   By: Markus Daft M.D.   On: 10/10/2019 15:12    Assessment/Plan:  73yo F of asian descent, DM, cardiac device who was admitted for sepsis found to have disseminated klebsiella infection. Concern for hypervirulent klebsiella pneumonia strain as well as  involvement of cardiac device. Generally do not associated gram negative endocarditis with non-IV drug use but HvKpna has tendency to have biofilm  - recommend continue on meropenem - repeat blood cx today - recommend to get TEE to evaluate pacer wires for vegetation - will narrow abtx based on findings from abscess fluid - have asked micro to replate isolate to do string test (easy diagnostics to confirm hv k.pna)  Tahjir Silveria B. [facility] [phone]

## 2019-10-10 NOTE — Progress Notes (Signed)
PROGRESS NOTE    Courtney [name]  WUJ:[id number] DOB: 1946-02-06 DOA: 10/06/2019 PCP: Patient, No Pcp Per     Brief Narrative:  Courtney [name] is a [place] speaking 73 year old female with past medical history significant for type 2 diabetes, atrial fibrillation, tachybradycardia syndrome who was brought into the emergency department with chief complaint of altered mental status.  In the emergency department, there was concern for meningitis and patient was started on vancomycin, ampicillin, ceftriaxone.  LP not pursued in the emergency department as patient was on Eliquis and was unclear when her last dose was.  She was also found in DKA and was started on IV insulin.  During her hospitalization, she was found to have Klebsiella bacteremia, ESBL UTI, multifocal pneumonia.  Her mentation continued to improve and as she had source for her sepsis and altered mental status, LP was canceled.  On 10/26, rapid response was called due to acute respiratory distress.  Patient was transferred to the ICU with PCCM consulted. Due to concern for cholangitis, GI, IR, general surgery consulted as well.   New events last 24 hours / Subjective: Patient evaluated with audio interpreter at bedside.  She denies any abdominal pain, states that she is feeling well overall.  Admits to some chest pain when she is sitting up but none at rest.  Assessment & Plan:   Active Problems:   Altered mental status   AMS (altered mental status)   Acute metabolic encephalopathy -Multifactorial in setting of DKA as well as underlying sepsis -Returned back to baseline   Severe sepsis secondary to Klebsiella bacteremia, ESBL UTI, multifocal pneumonia, concern for cholangitis, liver abscesses  -At the time of admission there was concern for meningitis due to patient's fever, altered mental status, headache.  LP was not pursued at admission due to patient being on Eliquis.  Son confirms that patient's last Eliquis dose was 2 days ago.  LP not  further pursued as patient has other source of infection to explain her presenting symptoms.  Meningitis is low on the differential. -Blood culture returned with Klebsiella -CT abdomen pelvis revealed diffuse gallbladder edema, pericholecystic fluid, concern for intrahepatic abscesses, concern for cholangitis  -MRCP unable to be completed due to pacemaker leads -Urine culture returned with ESBL EColi and staph epidermidis --> antibiotics changed to [facility] 10/26 -GI following  -General surgery following, no role for surgery at this time -IR consulted for liver abscess aspiration vs drain placement  -HIDA negative for cholecystitis -ID consulted for antibiotic assistance  Hepatitis  -Hep A IgM NR, Ab reactive -Hep B surface Ag NR, IgM NR,Core Ab reactive -Hep C Ab NR   -Patient reports that she has had hepatitis in the past, unable to give other history regarding this    Acute hypoxemic respiratory failure -Required nonrebreather on 10/26 -PCCM consulted, signed off 10/28  -Currently on room air  Elevated liver enzymes -Improving, trend   Paroxysmal atrial fibrillation with RVR -Patient was started on Cardizem drip at the time of admission -Currently in normal sinus rhythm with rate control  Tachybradycardia syndrome -Pacemaker placed 2018 -Continue flecainide, metoprolol  DKA -Hemoglobin A1c 16.4 -Patient required IV insulin during hospitalization and has now been transitioned to Lantus and sliding scale insulin   DVT prophylaxis: SCD (Eliquis PTA) Code Status: Full code Family Communication: None at bedside  Disposition Plan: Pending further work-up, clinical improvement    Consultants:   GI  PCCM  GI  IR  ID   Procedures:   None  Antimicrobials:  Anti-infectives (From admission, onward)   Start     Dose/Rate Route Frequency Ordered Stop   10/08/19 1000  meropenem (MERREM) 1 g in sodium chloride 0.9 % 100 mL IVPB     1 g 200 mL/hr over 30 Minutes  Intravenous Every 12 hours 10/08/19 0848     10/07/19 1700  vancomycin (VANCOCIN) IVPB 750 mg/150 ml premix  Status:  Discontinued     750 mg 150 mL/hr over 60 Minutes Intravenous Every 24 hours 10/06/19 1608 10/07/19 0427   10/07/19 1700  vancomycin (VANCOCIN) IVPB 750 mg/150 ml premix  Status:  Discontinued     750 mg 150 mL/hr over 60 Minutes Intravenous Every 24 hours 10/07/19 1247 10/08/19 0836   10/07/19 1245  ampicillin (OMNIPEN) 2 g in sodium chloride 0.9 % 100 mL IVPB  Status:  Discontinued     2 g 300 mL/hr over 20 Minutes Intravenous Every 6 hours 10/07/19 1238 10/08/19 0836   10/07/19 0200  cefTRIAXone (ROCEPHIN) 2 g in sodium chloride 0.9 % 100 mL IVPB  Status:  Discontinued     2 g 200 mL/hr over 30 Minutes Intravenous Every 12 hours 10/06/19 1610 10/08/19 0836   10/06/19 1615  vancomycin (VANCOCIN) 1,250 mg in sodium chloride 0.9 % 250 mL IVPB     1,250 mg 166.7 mL/hr over 90 Minutes Intravenous  Once 10/06/19 1608 10/06/19 2111   10/06/19 1615  ampicillin (OMNIPEN) 2 g in sodium chloride 0.9 % 100 mL IVPB  Status:  Discontinued     2 g 300 mL/hr over 20 Minutes Intravenous Every 4 hours 10/06/19 1613 10/07/19 0427   10/06/19 1600  acyclovir (ZOVIRAX) 550 mg in dextrose 5 % 100 mL IVPB     550 mg 111 mL/hr over 60 Minutes Intravenous  Once 10/06/19 1558 10/06/19 2216   10/06/19 1230  cefTRIAXone (ROCEPHIN) 2 g in sodium chloride 0.9 % 100 mL IVPB     2 g 200 mL/hr over 30 Minutes Intravenous  Once 10/06/19 1215 10/06/19 1431       Objective: Vitals:   10/10/19 0438 10/10/19 0500 10/10/19 0600 10/10/19 0744  BP:  (!) 97/58 93/65   Pulse:  68 64   Resp:  19 19   Temp: 98 F (36.7 C)   97.8 F (36.6 C)  TempSrc: Oral   Oral  SpO2:  96% 96%   Weight:      Height:        Intake/Output Summary (Last 24 hours) at 10/10/2019 1052 Last data filed at 10/10/2019 0600 Gross per 24 hour  Intake 1846.34 ml  Output 250 ml  Net 1596.34 ml   Filed Weights   10/06/19  1503 10/07/19 0142 10/08/19 0641  Weight: 54.4 kg 45.2 kg 47.7 kg    Examination: General exam: Appears calm and comfortable  Respiratory system: Clear to auscultation. Respiratory effort normal. Cardiovascular system: S1 & S2 heard, RRR. No pedal edema. Gastrointestinal system: Abdomen is nondistended, soft and nontender. Normal bowel sounds heard. Central nervous system: Alert and oriented. Non focal exam. Speech clear  Extremities: Symmetric in appearance bilaterally  Skin: No rashes, lesions or ulcers on exposed skin  Psychiatry: Judgement and insight appear stable. Mood & affect appropriate.    Data Reviewed: I have personally reviewed following labs and imaging studies  CBC: Recent Labs  Lab 10/06/19 1240 10/07/19 0429 10/08/19 0531 10/09/19 0459 10/10/19 0419  WBC 14.9* 7.3 10.4 12.9* 20.6*  NEUTROABS 12.1*  --   --   --   --  HGB 11.9* 10.5* 10.6* 10.5* 13.0  HCT 37.1 30.4* 29.8* 29.7* 36.2  MCV 97.6 89.4 87.6 88.1 87.2  PLT 101* 55* 43* 33* 60*   Basic Metabolic Panel: Recent Labs  Lab 10/07/19 0429 10/08/19 0531 10/08/19 1603 10/09/19 0459 10/10/19 0419  NA 137 134* 135 137 137  K 2.1* 3.4* 3.7 4.0 4.5  CL 110 109 111 115* 112*  CO2 15* 14* 12* 15* 15*  GLUCOSE 95 162* 205* 129* 118*  BUN 12 24* 23 25* 30*  CREATININE 0.73 0.76 0.83 0.68 0.72  CALCIUM 8.0* 7.8* 7.9* 7.8* 7.8*  MG 1.6* 2.1 1.8 1.9  --    GFR: Estimated Creatinine Clearance: 47.9 mL/min (by C-G formula based on SCr of 0.72 mg/dL). Liver Function Tests: Recent Labs  Lab 10/06/19 1240 10/07/19 0429 10/08/19 0531 10/09/19 0459 10/10/19 0419  AST 64* 56* 2,353* 995* 293*  ALT 84* 59* 885* 794* 481*  ALKPHOS 118 99 98 109 114  BILITOT 1.5* 0.5 0.9 1.2 1.2  PROT 6.7 5.4* 5.4* 5.0* 4.6*  ALBUMIN 2.8* 2.0* 2.0* 1.8* 1.5*   No results for input(s): LIPASE, AMYLASE in the last 168 hours. No results for input(s): AMMONIA in the last 168 hours. Coagulation Profile: Recent Labs  Lab  10/06/19 1240 10/08/19 1215 10/09/19 1102  INR 1.2 1.3* 1.3*   Cardiac Enzymes: Recent Labs  Lab 10/09/19 0633  CKTOTAL 41   BNP (last 3 results) No results for input(s): PROBNP in the last 8760 hours. HbA1C: No results for input(s): HGBA1C in the last 72 hours. CBG: Recent Labs  Lab 10/09/19 1640 10/09/19 2019 10/10/19 0042 10/10/19 0424 10/10/19 0742  GLUCAP 95 178* 88 112* 122*   Lipid Profile: No results for input(s): CHOL, HDL, LDLCALC, TRIG, CHOLHDL, LDLDIRECT in the last 72 hours. Thyroid Function Tests: Recent Labs    10/08/19 1603  TSH 0.894   Anemia Panel: No results for input(s): VITAMINB12, FOLATE, FERRITIN, TIBC, IRON, RETICCTPCT in the last 72 hours. Sepsis Labs: Recent Labs  Lab 10/07/19 1039 10/08/19 1611 10/08/19 2236 10/09/19 1102  LATICACIDVEN 1.5 4.3* 2.7* 1.7    Recent Results (from the past 240 hour(s))  Blood Culture (routine x 2)     Status: Abnormal   Collection Time: 10/06/19 12:40 PM   Specimen: BLOOD  Result Value Ref Range Status   Specimen Description BLOOD RIGHT ANTECUBITAL  Final   Special Requests   Final    BOTTLES DRAWN AEROBIC AND ANAEROBIC Blood Culture results may not be optimal due to an inadequate volume of blood received in culture bottles   Culture  Setup Time   Final    IN BOTH AEROBIC AND ANAEROBIC BOTTLES GRAM NEGATIVE RODS Organism ID to follow CRITICAL RESULT CALLED TO, READ BACK BY AND VERIFIED WITH: Evelena PeatG ABBOTT [facility] 10/07/19 0419 JDW Performed at [facility], [address] [place], [place] [address]    Culture KLEBSIELLA PNEUMONIAE (A)  Final   Report Status 10/09/2019 FINAL  Final   Organism ID, Bacteria KLEBSIELLA PNEUMONIAE  Final      Susceptibility   Klebsiella pneumoniae - MIC*    AMPICILLIN >=32 RESISTANT Resistant     CEFAZOLIN <=4 SENSITIVE Sensitive     CEFEPIME <=1 SENSITIVE Sensitive     CEFTAZIDIME <=1 SENSITIVE Sensitive     CEFTRIAXONE <=1 SENSITIVE Sensitive     CIPROFLOXACIN  <=0.25 SENSITIVE Sensitive     GENTAMICIN <=1 SENSITIVE Sensitive     IMIPENEM 0.5 SENSITIVE Sensitive  TRIMETH/SULFA <=20 SENSITIVE Sensitive     AMPICILLIN/SULBACTAM 8 SENSITIVE Sensitive     PIP/TAZO <=4 SENSITIVE Sensitive     Extended ESBL NEGATIVE Sensitive     * KLEBSIELLA PNEUMONIAE  Blood Culture ID Panel (Reflexed)     Status: Abnormal   Collection Time: 10/06/19 12:40 PM  Result Value Ref Range Status   Enterococcus species NOT DETECTED NOT DETECTED Final   Listeria monocytogenes NOT DETECTED NOT DETECTED Final   Staphylococcus species NOT DETECTED NOT DETECTED Final   Staphylococcus aureus (BCID) NOT DETECTED NOT DETECTED Final   Streptococcus species NOT DETECTED NOT DETECTED Final   Streptococcus agalactiae NOT DETECTED NOT DETECTED Final   Streptococcus pneumoniae NOT DETECTED NOT DETECTED Final   Streptococcus pyogenes NOT DETECTED NOT DETECTED Final   Acinetobacter baumannii NOT DETECTED NOT DETECTED Final   Enterobacteriaceae species DETECTED (A) NOT DETECTED Final    Comment: Enterobacteriaceae represent a large family of gram-negative bacteria, not a single organism. CRITICAL RESULT CALLED TO, READ BACK BY AND VERIFIED WITH: G ABBOTT PHARMD 10/07/19 0419 JDW    Enterobacter cloacae complex NOT DETECTED NOT DETECTED Final   Escherichia coli NOT DETECTED NOT DETECTED Final   Klebsiella oxytoca NOT DETECTED NOT DETECTED Final   Klebsiella pneumoniae DETECTED (A) NOT DETECTED Final    Comment: CRITICAL RESULT CALLED TO, READ BACK BY AND VERIFIED WITH: G ABBOTT PHARMD 10/07/19 0419 JDW    Proteus species NOT DETECTED NOT DETECTED Final   Serratia marcescens NOT DETECTED NOT DETECTED Final   Carbapenem resistance NOT DETECTED NOT DETECTED Final   Haemophilus influenzae NOT DETECTED NOT DETECTED Final   Neisseria meningitidis NOT DETECTED NOT DETECTED Final   Pseudomonas aeruginosa NOT DETECTED NOT DETECTED Final   Candida albicans NOT DETECTED NOT DETECTED Final    Candida glabrata NOT DETECTED NOT DETECTED Final   Candida krusei NOT DETECTED NOT DETECTED Final   Candida parapsilosis NOT DETECTED NOT DETECTED Final   Candida tropicalis NOT DETECTED NOT DETECTED Final    Comment: Performed at [facility], [address] [place], [place] [address]  Blood Culture (routine x 2)     Status: Abnormal   Collection Time: 10/06/19 12:45 PM   Specimen: BLOOD RIGHT HAND  Result Value Ref Range Status   Specimen Description BLOOD RIGHT HAND  Final   Special Requests   Final    BOTTLES DRAWN AEROBIC AND ANAEROBIC Blood Culture results may not be optimal due to an inadequate volume of blood received in culture bottles   Culture  Setup Time   Final    IN BOTH AEROBIC AND ANAEROBIC BOTTLES GRAM NEGATIVE RODS CRITICAL VALUE NOTED.  VALUE IS CONSISTENT WITH PREVIOUSLY REPORTED AND CALLED VALUE.    Culture (A)  Final    KLEBSIELLA PNEUMONIAE SUSCEPTIBILITIES PERFORMED ON PREVIOUS CULTURE WITHIN THE LAST 5 DAYS. Performed at [facility], [address], [place], [place] [address]    Report Status 10/09/2019 FINAL  Final  SARS CORONAVIRUS 2 (TAT 6-24 HRS) Nasopharyngeal Nasopharyngeal Swab     Status: None   Collection Time: 10/06/19  3:35 PM   Specimen: Nasopharyngeal Swab  Result Value Ref Range Status   SARS Coronavirus 2 NEGATIVE NEGATIVE Final    Comment: (NOTE) SARS-CoV-2 target nucleic acids are NOT DETECTED. The SARS-CoV-2 RNA is generally detectable in upper and lower respiratory specimens during the acute phase of infection. Negative results do not preclude SARS-CoV-2 infection, do not rule out co-infections with other pathogens, and should  not be used as the sole basis for treatment or other patient management decisions. Negative results must be combined with clinical observations, patient history, and epidemiological information. The expected result is Negative. Fact Sheet for  Patients: HairSlick.no Fact Sheet for Healthcare Providers: quierodirigir.com This test is not yet approved or cleared by the [place] FDA and  has been authorized for detection and/or diagnosis of SARS-CoV-2 by FDA under an Emergency Use Authorization (EUA). This EUA will remain  in effect (meaning this test can be used) for the duration of the COVID-19 declaration under Section 56 4(b)(1) of the Act, 21 U.S.C. section 360bbb-3(b)(1), unless the authorization is terminated or revoked sooner. Performed at [facility], [address] [place], [place] [address]   Urine culture     Status: Abnormal   Collection Time: 10/06/19  4:02 PM   Specimen: In/Out Cath Urine  Result Value Ref Range Status   Specimen Description IN/OUT CATH URINE  Final   Special Requests   Final    NONE Performed at [facility], [address] [place], [place] [address]    Culture (A)  Final    30,000 COLONIES/mL ESCHERICHIA COLI Confirmed Extended Spectrum Beta-Lactamase Producer (ESBL).  In bloodstream infections from ESBL organisms, carbapenems are preferred over piperacillin/tazobactam. They are shown to have a lower risk of mortality. 10,000 COLONIES/mL STAPHYLOCOCCUS EPIDERMIDIS    Report Status 10/08/2019 FINAL  Final   Organism ID, Bacteria ESCHERICHIA COLI (A)  Final   Organism ID, Bacteria STAPHYLOCOCCUS EPIDERMIDIS (A)  Final      Susceptibility   Escherichia coli - MIC*    AMPICILLIN >=32 RESISTANT Resistant     CEFAZOLIN >=64 RESISTANT Resistant     CEFTRIAXONE >=64 RESISTANT Resistant     CIPROFLOXACIN >=4 RESISTANT Resistant     GENTAMICIN <=1 SENSITIVE Sensitive     IMIPENEM <=0.25 SENSITIVE Sensitive     NITROFURANTOIN <=16 SENSITIVE Sensitive     TRIMETH/SULFA <=20 SENSITIVE Sensitive     AMPICILLIN/SULBACTAM 4 SENSITIVE Sensitive     PIP/TAZO <=4 SENSITIVE Sensitive     Extended ESBL POSITIVE Resistant     *  30,000 COLONIES/mL ESCHERICHIA COLI   Staphylococcus epidermidis - MIC*    CIPROFLOXACIN 2 INTERMEDIATE Intermediate     GENTAMICIN <=0.5 SENSITIVE Sensitive     NITROFURANTOIN <=16 SENSITIVE Sensitive     OXACILLIN >=4 RESISTANT Resistant     TETRACYCLINE <=1 SENSITIVE Sensitive     VANCOMYCIN 1 SENSITIVE Sensitive     TRIMETH/SULFA <=10 SENSITIVE Sensitive     CLINDAMYCIN <=0.25 SENSITIVE Sensitive     RIFAMPIN <=0.5 SENSITIVE Sensitive     Inducible Clindamycin NEGATIVE Sensitive     * 10,000 COLONIES/mL STAPHYLOCOCCUS EPIDERMIDIS  MRSA PCR Screening     Status: None   Collection Time: 10/08/19 10:50 PM   Specimen: Nasal Mucosa; Nasopharyngeal  Result Value Ref Range Status   MRSA by PCR NEGATIVE NEGATIVE Final    Comment:        The GeneXpert MRSA Assay (FDA approved for NASAL specimens only), is one component of a comprehensive MRSA colonization surveillance program. It is not intended to diagnose MRSA infection nor to guide or monitor treatment for MRSA infections. Performed at [facility], [address] [place], [place] [address]       Radiology Studies: Nm Hepatobiliary Liver Func  Result Date: 10/09/2019 CLINICAL DATA:  Upper abdominal pain and abnormal appearing gallbladder on recent ultrasound examination EXAM: NUCLEAR MEDICINE  HEPATOBILIARY IMAGING TECHNIQUE: Sequential images of the abdomen were obtained out to 80 minutes following intravenous administration of radiopharmaceutical. RADIOPHARMACEUTICALS:  5.3 mCi Tc-38m  Choletec IV COMPARISON:  Ultrasound right upper quadrant October 08, 2019 FINDINGS: Liver uptake of radiotracer is unremarkable. There is visualization of small bowel indicating patency of the common bile duct. Gallbladder visualizes, although somewhat delayed compared to visualization of the small bowel. IMPRESSION: There is visualization of the gallbladder which does indicate patency of the cystic duct. Relative delay in visualization of  the gallbladder compared to small bowel may be a finding indicative of chronic cholecystitis. Common bile duct is patent as is evidenced by visualization of the small bowel. Comment: Note that radiotracer is seen in the right upper extremity due to apparent infiltration of intravenous access site earlier in the course of the study. Electronically Signed   By: Lowella Grip III M.D.   On: 10/09/2019 16:58   US Abdomen Limited  Result Date: 10/08/2019 CLINICAL DATA:  Ascending cholangitis.  Abnormal CT. EXAM: ULTRASOUND ABDOMEN LIMITED RIGHT UPPER QUADRANT COMPARISON:  None. FINDINGS: Gallbladder: There is gallbladder wall thickening with the gallbladder measuring approximately 3-4 mm. There is pericholecystic free fluid. Gallbladder sludge is noted. The sonographic Percell Miller sign was not reliably evaluated secondary to patient condition. Common bile duct: Diameter: 3 mm Liver: There is a 5 x 2.5 x 4.8 cm collection in the right hepatic lobe. This collection demonstrates no significant internal color Doppler flow. Portal vein is patent on color Doppler imaging with normal direction of blood flow towards the liver. Other: There is a right-sided pleural effusion. IMPRESSION: 1. Gallbladder sludge with pericholecystic free fluid and gallbladder wall thickening. Findings could represent acute cholecystitis in the appropriate clinical setting. The sonographic Percell Miller sign could not be reliably assessed. Further evaluation can be performed with HIDA scan as clinically indicated. 2. Complex collection in the right hepatic lobe as detailed above, again concerning for developing intrahepatic abscess in the appropriate clinical setting. 3. Incidentally noted right-sided pleural effusion. Electronically Signed   By: Constance Holster M.D.   On: 10/08/2019 21:02   Dg Chest Port 1 View  Result Date: 10/08/2019 CLINICAL DATA:  Hypoxia and shortness of breath. EXAM: PORTABLE CHEST 1 VIEW COMPARISON:  10/07/2019 and  10/06/2019 FINDINGS: Left-sided pacemaker unchanged. Lungs are adequately inflated demonstrate continued mild patchy bilateral airspace process with slight worsening over the left midlung and left upper lobe. No effusion. Cardiomediastinal silhouette and remainder of the exam is unchanged. IMPRESSION: Slight interval worsening bilateral patchy airspace process likely multifocal infection. Electronically Signed   By: Marin Olp M.D.   On: 10/08/2019 16:00      Scheduled Meds:  Chlorhexidine Gluconate Cloth  6 each Topical Daily   flecainide  50 mg Oral BID   insulin aspart  0-20 Units Subcutaneous Q4H   insulin glargine  10 Units Subcutaneous Daily   pantoprazole (PROTONIX) IV  40 mg Intravenous Q24H   Continuous Infusions:  sodium chloride Stopped (10/09/19 0959)   lactated ringers 100 mL/hr at 10/10/19 0600   meropenem (MERREM) IV 1 g (10/10/19 1014)     LOS: 4 days      Time spent: 35 minutes   Dessa Phi, DO [facility] 10/10/2019, 10:52 AM   Available via Epic secure chat 7am-7pm After these hours, please refer to coverage provider listed on [email or web address]

## 2019-10-10 NOTE — Progress Notes (Signed)
Patient ID: Courtney [name], female   DOB: 03/19/1946, 73 y.o.   MRN: [id number]       Subjective: [place] interpretor used to speak to patient this morning.  She denies any abdominal pain today.  No nausea or vomiting.  States she is aware that she has hepatitis, but really unclear how long she has known or if only this admission.  ROS: See above, otherwise other systems negative  Objective: Vital signs in last 24 hours: Temp:  [97.3 F (36.3 C)-98.2 F (36.8 C)] 97.8 F (36.6 C) (10/28 0744) Pulse Rate:  [58-68] 64 (10/28 0600) Resp:  [16-28] 19 (10/28 0600) BP: (87-114)/(51-79) 93/65 (10/28 0600) SpO2:  [95 %-98 %] 96 % (10/28 0600) Last BM Date: 10/09/19  Intake/Output from previous day: 10/27 0701 - 10/28 0700 In: 2444.1 [I.V.:1944; IV Piggyback:500.2] Out: 250 [Urine:250] Intake/Output this shift: No intake/output data recorded.  PE: Heart: regular Lungs: CTAB Abd: soft, minimally tender in RUQ, otherwise nontender, +BS, ND  Lab Results:  Recent Labs    10/09/19 0459 10/10/19 0419  WBC 12.9* 20.6*  HGB 10.5* 13.0  HCT 29.7* 36.2  PLT 33* 60*   BMET Recent Labs    10/09/19 0459 10/10/19 0419  NA 137 137  K 4.0 4.5  CL 115* 112*  CO2 15* 15*  GLUCOSE 129* 118*  BUN 25* 30*  CREATININE 0.68 0.72  CALCIUM 7.8* 7.8*   PT/INR Recent Labs    10/08/19 1215 10/09/19 1102  LABPROT 16.0* 16.4*  INR 1.3* 1.3*   CMP     Component Value Date/Time   NA 137 10/10/2019 0419   NA 132 (L) 08/29/2018 1655   K 4.5 10/10/2019 0419   CL 112 (H) 10/10/2019 0419   CO2 15 (L) 10/10/2019 0419   GLUCOSE 118 (H) 10/10/2019 0419   BUN 30 (H) 10/10/2019 0419   BUN 26 08/29/2018 1655   CREATININE 0.72 10/10/2019 0419   CREATININE 0.62 12/07/2013 1256   CALCIUM 7.8 (L) 10/10/2019 0419   PROT 4.6 (L) 10/10/2019 0419   ALBUMIN 1.5 (L) 10/10/2019 0419   AST 293 (H) 10/10/2019 0419   ALT 481 (H) 10/10/2019 0419   ALKPHOS 114 10/10/2019 0419   BILITOT 1.2 10/10/2019 0419    GFRNONAA >60 10/10/2019 0419   GFRAA >60 10/10/2019 0419   Lipase     Component Value Date/Time   LIPASE 34 09/04/2017 1054       Studies/Results: Nm Hepatobiliary Liver Func  Result Date: 10/09/2019 CLINICAL DATA:  Upper abdominal pain and abnormal appearing gallbladder on recent ultrasound examination EXAM: NUCLEAR MEDICINE HEPATOBILIARY IMAGING TECHNIQUE: Sequential images of the abdomen were obtained out to 80 minutes following intravenous administration of radiopharmaceutical. RADIOPHARMACEUTICALS:  5.3 mCi Tc-7726m  Choletec IV COMPARISON:  Ultrasound right upper quadrant October 08, 2019 FINDINGS: Liver uptake of radiotracer is unremarkable. There is visualization of small bowel indicating patency of the common bile duct. Gallbladder visualizes, although somewhat delayed compared to visualization of the small bowel. IMPRESSION: There is visualization of the gallbladder which does indicate patency of the cystic duct. Relative delay in visualization of the gallbladder compared to small bowel may be a finding indicative of chronic cholecystitis. Common bile duct is patent as is evidenced by visualization of the small bowel. Comment: Note that radiotracer is seen in the right upper extremity due to apparent infiltration of intravenous access site earlier in the course of the study. Electronically Signed   By: Bretta BangWilliam  Woodruff III M.D.   On:  10/09/2019 16:58   US Abdomen Limited  Result Date: 10/08/2019 CLINICAL DATA:  Ascending cholangitis.  Abnormal CT. EXAM: ULTRASOUND ABDOMEN LIMITED RIGHT UPPER QUADRANT COMPARISON:  None. FINDINGS: Gallbladder: There is gallbladder wall thickening with the gallbladder measuring approximately 3-4 mm. There is pericholecystic free fluid. Gallbladder sludge is noted. The sonographic Eulah Pont sign was not reliably evaluated secondary to patient condition. Common bile duct: Diameter: 3 mm Liver: There is a 5 x 2.5 x 4.8 cm collection in the right hepatic lobe.  This collection demonstrates no significant internal color Doppler flow. Portal vein is patent on color Doppler imaging with normal direction of blood flow towards the liver. Other: There is a right-sided pleural effusion. IMPRESSION: 1. Gallbladder sludge with pericholecystic free fluid and gallbladder wall thickening. Findings could represent acute cholecystitis in the appropriate clinical setting. The sonographic Eulah Pont sign could not be reliably assessed. Further evaluation can be performed with HIDA scan as clinically indicated. 2. Complex collection in the right hepatic lobe as detailed above, again concerning for developing intrahepatic abscess in the appropriate clinical setting. 3. Incidentally noted right-sided pleural effusion. Electronically Signed   By: Katherine Mantle M.D.   On: 10/08/2019 21:02   Dg Chest Port 1 View  Result Date: 10/08/2019 CLINICAL DATA:  Hypoxia and shortness of breath. EXAM: PORTABLE CHEST 1 VIEW COMPARISON:  10/07/2019 and 10/06/2019 FINDINGS: Left-sided pacemaker unchanged. Lungs are adequately inflated demonstrate continued mild patchy bilateral airspace process with slight worsening over the left midlung and left upper lobe. No effusion. Cardiomediastinal silhouette and remainder of the exam is unchanged. IMPRESSION: Slight interval worsening bilateral patchy airspace process likely multifocal infection. Electronically Signed   By: Elberta Fortis M.D.   On: 10/08/2019 16:00    Anti-infectives: Anti-infectives (From admission, onward)   Start     Dose/Rate Route Frequency Ordered Stop   10/08/19 1000  meropenem (MERREM) 1 g in sodium chloride 0.9 % 100 mL IVPB     1 g 200 mL/hr over 30 Minutes Intravenous Every 12 hours 10/08/19 0848     10/07/19 1700  vancomycin (VANCOCIN) IVPB 750 mg/150 ml premix  Status:  Discontinued     750 mg 150 mL/hr over 60 Minutes Intravenous Every 24 hours 10/06/19 1608 10/07/19 0427   10/07/19 1700  vancomycin (VANCOCIN) IVPB 750  mg/150 ml premix  Status:  Discontinued     750 mg 150 mL/hr over 60 Minutes Intravenous Every 24 hours 10/07/19 1247 10/08/19 0836   10/07/19 1245  ampicillin (OMNIPEN) 2 g in sodium chloride 0.9 % 100 mL IVPB  Status:  Discontinued     2 g 300 mL/hr over 20 Minutes Intravenous Every 6 hours 10/07/19 1238 10/08/19 0836   10/07/19 0200  cefTRIAXone (ROCEPHIN) 2 g in sodium chloride 0.9 % 100 mL IVPB  Status:  Discontinued     2 g 200 mL/hr over 30 Minutes Intravenous Every 12 hours 10/06/19 1610 10/08/19 0836   10/06/19 1615  vancomycin (VANCOCIN) 1,250 mg in sodium chloride 0.9 % 250 mL IVPB     1,250 mg 166.7 mL/hr over 90 Minutes Intravenous  Once 10/06/19 1608 10/06/19 2111   10/06/19 1615  ampicillin (OMNIPEN) 2 g in sodium chloride 0.9 % 100 mL IVPB  Status:  Discontinued     2 g 300 mL/hr over 20 Minutes Intravenous Every 4 hours 10/06/19 1613 10/07/19 0427   10/06/19 1600  acyclovir (ZOVIRAX) 550 mg in dextrose 5 % 100 mL IVPB     550 mg 111  mL/hr over 60 Minutes Intravenous  Once 10/06/19 1558 10/06/19 2216   10/06/19 1230  cefTRIAXone (ROCEPHIN) 2 g in sodium chloride 0.9 % 100 mL IVPB     2 g 200 mL/hr over 30 Minutes Intravenous  Once 10/06/19 1215 10/06/19 1431       Assessment/Plan  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  Her A and B are positive. -her HIDA is negative for cholecystitis. -suspect liver abscesses are likely a result of her hepatitis at this point instead of her gallbladder.   -IR plans to aspirate vs drain her liver abscess today -no plans for surgical intervention -further work up of hepatitis per GI   FEN - NPO for procedures VTE - SCDs ID - Merrem   LOS: 4 days    Henreitta Cea , [facility] 10/10/2019, 8:43 AM Please see Amion for pager number during day hours  7:00am-4:30pm

## 2019-10-11 DIAGNOSIS — D72829 Elevated white blood cell count, unspecified: Secondary | ICD-10-CM

## 2019-10-11 DIAGNOSIS — N390 Urinary tract infection, site not specified: Secondary | ICD-10-CM

## 2019-10-11 LAB — CBC
HCT: 34.1 % — ABNORMAL LOW (ref 36.0–46.0)
Hemoglobin: 11.9 g/dL — ABNORMAL LOW (ref 12.0–15.0)
MCH: 30.8 pg (ref 26.0–34.0)
MCHC: 34.9 g/dL (ref 30.0–36.0)
MCV: 88.3 fL (ref 80.0–100.0)
Platelets: 98 10*3/uL — ABNORMAL LOW (ref 150–400)
RBC: 3.86 MIL/uL — ABNORMAL LOW (ref 3.87–5.11)
RDW: 13.2 % (ref 11.5–15.5)
WBC: 20.8 10*3/uL — ABNORMAL HIGH (ref 4.0–10.5)
nRBC: 0.2 % (ref 0.0–0.2)

## 2019-10-11 LAB — BASIC METABOLIC PANEL
Anion gap: 8 (ref 5–15)
Anion gap: 8 (ref 5–15)
BUN: 23 mg/dL (ref 8–23)
BUN: 15 mg/dL (ref 8–23)
CO2: 18 mmol/L — ABNORMAL LOW (ref 22–32)
CO2: 21 mmol/L — ABNORMAL LOW (ref 22–32)
Calcium: 7.4 mg/dL — ABNORMAL LOW (ref 8.9–10.3)
Calcium: 7.6 mg/dL — ABNORMAL LOW (ref 8.9–10.3)
Chloride: 112 mmol/L — ABNORMAL HIGH (ref 98–111)
Chloride: 110 mmol/L (ref 98–111)
Creatinine, Ser: 0.82 mg/dL (ref 0.44–1.00)
Creatinine, Ser: 0.73 mg/dL (ref 0.44–1.00)
GFR calc Af Amer: >60 mL/min (ref >60)
GFR calc Af Amer: >60 mL/min (ref >60)
GFR calc non Af Amer: >60 mL/min (ref >60)
GFR calc non Af Amer: >60 mL/min (ref >60)
Glucose, Bld: 80 mg/dL (ref 70–99)
Glucose, Bld: 129 mg/dL — ABNORMAL HIGH (ref 70–99)
Potassium: 3.2 mmol/L — ABNORMAL LOW (ref 3.5–5.1)
Potassium: 3.7 mmol/L (ref 3.5–5.1)
Sodium: 138 mmol/L (ref 135–145)
Sodium: 139 mmol/L (ref 135–145)

## 2019-10-11 LAB — HEPATIC FUNCTION PANEL
ALT: 370 U/L — ABNORMAL HIGH (ref 0–44)
AST: 273 U/L — ABNORMAL HIGH (ref 15–41)
Albumin: 1.5 g/dL — ABNORMAL LOW (ref 3.5–5.0)
Alkaline Phosphatase: 122 U/L (ref 38–126)
Bilirubin, Direct: 0.2 mg/dL (ref 0.0–0.2)
Indirect Bilirubin: 0.8 mg/dL (ref 0.3–0.9)
Total Bilirubin: 1 mg/dL (ref 0.3–1.2)
Total Protein: 4.4 g/dL — ABNORMAL LOW (ref 6.5–8.1)

## 2019-10-11 LAB — GLUCOSE, CAPILLARY
Glucose-Capillary: 68 mg/dL — ABNORMAL LOW (ref 70–99)
Glucose-Capillary: 102 mg/dL — ABNORMAL HIGH (ref 70–99)
Glucose-Capillary: 218 mg/dL — ABNORMAL HIGH (ref 70–99)
Glucose-Capillary: 117 mg/dL — ABNORMAL HIGH (ref 70–99)
Glucose-Capillary: 89 mg/dL (ref 70–99)

## 2019-10-11 LAB — PH, BODY FLUID: pH, Body Fluid: 7.8

## 2019-10-11 LAB — CYTOLOGY - NON PAP

## 2019-10-11 LAB — MAGNESIUM: Magnesium: 1.7 mg/dL (ref 1.7–2.4)

## 2019-10-11 MED ORDER — SODIUM CHLORIDE 0.9 % IV SOLN
2.0000 g | INTRAVENOUS | Status: DC
  Administered 2019-10-11 – 2019-10-18 (×8): 2 g via INTRAVENOUS
  Filled 2019-10-11: qty 20
  Filled 2019-10-11 (×8): qty 2

## 2019-10-11 MED ORDER — SODIUM CHLORIDE 0.9 % IV SOLN: INTRAVENOUS | Status: DC

## 2019-10-11 MED ORDER — POTASSIUM CHLORIDE CRYS ER 20 MEQ PO TBCR
30.0000 meq | EXTENDED_RELEASE_TABLET | ORAL | Status: AC
  Administered 2019-10-11 (×2): 30 meq via ORAL
  Filled 2019-10-11 (×2): qty 1

## 2019-10-11 NOTE — Progress Notes (Signed)
    [facility] has been requested to perform a transesophageal echocardiogram on Courtney [name] for bacteremia with pacemaker.  After careful review of history and examination, the risks and benefits of transesophageal echocardiogram have been explained to the patient and her son, including risks of esophageal damage, perforation (1:10,000 risk), bleeding, pharyngeal hematoma as well as other potential complications associated with conscious sedation including aspiration, arrhythmia, respiratory failure and death. Alternatives to treatment were discussed, questions were answered. The patient nods understanding but I am not sure of her complete understanding due to language barrier. She declines an interpreter. Her son verbalizes good understanding and says that he and the patient are willing to proceed.   The procedure is scheduled for 10/12/2019 at 14:00 with Dr. Marlou Porch.  Note Platelets at 98 today, had been down to 33 on 10/09/2019. Hgb is stable at 11.9 today. She is positive for hepatitis, liver enzymes improving. Pt denies any alcohol use. No swallowing difficulties.   Daune Perch, NP  10/11/2019 3:49 PM

## 2019-10-11 NOTE — Progress Notes (Signed)
[facility] for Infectious Disease    Date of Admission:  10/06/2019   Total days of antibiotics 6/day 4 meropenem                  ID: Courtney [name] is a 73 y.o. female with klebsiella bacteremia with hepatic abscess s/p drainage Active Problems:   Altered mental status   AMS (altered mental status)    Subjective: Afebrile, more alert feels better than on admit though still complains of lower abdominal pain.   Medications:  . chlorhexidine  15 mL Mouth Rinse BID  . Chlorhexidine Gluconate Cloth  6 each Topical Daily  . flecainide  50 mg Oral BID  . insulin aspart  0-20 Units Subcutaneous Q4H  . insulin glargine  10 Units Subcutaneous Daily  . mouth rinse  15 mL Mouth Rinse q12n4p  . pantoprazole (PROTONIX) IV  40 mg Intravenous Q24H    Objective: Vital signs in last 24 hours: Temp:  [97.4 F (36.3 C)-99.6 F (37.6 C)] 99.6 F (37.6 C) (10/29 1600) Pulse Rate:  [29-83] 46 (10/29 1530) Resp:  [15-29] 24 (10/29 1530) BP: (91-187)/(49-97) 100/51 (10/29 1530) SpO2:  [93 %-98 %] 94 % (10/29 1530) Physical Exam  Constitutional:  oriented to person, place, and time. appears well-developed and well-nourished. No distress.  HENT: [place]/AT, PERRLA, no scleral icterus Mouth/Throat: Oropharynx is clear and moist. No oropharyngeal exudate.  Cardiovascular: Normal rate, regular rhythm and normal heart sounds. Exam reveals no gallop and no friction rub.  No murmur heard.  Pulmonary/Chest: Effort normal and breath sounds normal. No respiratory distress.  has no wheezes.  Neck = supple, no nuchal rigidity Abdominal: Soft. Bowel sounds are normal.  mild distension. mild tenderness.  Lymphadenopathy: no cervical adenopathy. No axillary adenopathy Neurological: alert and oriented to person, place, and time.  Skin: Skin is warm and dry. No rash noted. No erythema.  Psychiatric: a normal mood and affect.  behavior is normal.    Lab Results Recent Labs    10/10/19 0419 10/11/19 0233   WBC 20.6* 20.8*  HGB 13.0 11.9*  HCT 36.2 34.1*  NA 137 138  K 4.5 3.2*  CL 112* 112*  CO2 15* 18*  BUN 30* 23  CREATININE 0.72 0.82   Liver Panel Recent Labs    10/10/19 0419 10/11/19 0233  PROT 4.6* 4.4*  ALBUMIN 1.5* 1.5*  AST 293* 273*  ALT 481* 370*  ALKPHOS 114 122  BILITOT 1.2 1.0  BILIDIR 0.3* 0.2  IBILI 0.9 0.8    Microbiology: 10/28 hepatic fluid -GNR pending 10/28 ascites fluid- NGTD 10/24 blood cx kleb pneumoniae  Studies/Results: Nm Hepatobiliary Liver Func  Result Date: 10/09/2019 CLINICAL DATA:  Upper abdominal pain and abnormal appearing gallbladder on recent ultrasound examination EXAM: NUCLEAR MEDICINE HEPATOBILIARY IMAGING TECHNIQUE: Sequential images of the abdomen were obtained out to 80 minutes following intravenous administration of radiopharmaceutical. RADIOPHARMACEUTICALS:  5.3 mCi Tc-23m  Choletec IV COMPARISON:  Ultrasound right upper quadrant October 08, 2019 FINDINGS: Liver uptake of radiotracer is unremarkable. There is visualization of small bowel indicating patency of the common bile duct. Gallbladder visualizes, although somewhat delayed compared to visualization of the small bowel. IMPRESSION: There is visualization of the gallbladder which does indicate patency of the cystic duct. Relative delay in visualization of the gallbladder compared to small bowel may be a finding indicative of chronic cholecystitis. Common bile duct is patent as is evidenced by visualization of the small bowel. Comment: Note that radiotracer is  seen in the right upper extremity due to apparent infiltration of intravenous access site earlier in the course of the study. Electronically Signed   By: Bretta BangWilliam  Woodruff III M.D.   On: 10/09/2019 16:58   Ir Koreas Guide Bx Asp/drain  Result Date: 10/10/2019 INDICATION: 73 year old with bacteremia and concern for cholangitis and hepatic abscess. Cystic duct was patent on recent HIDA examination. EXAM: ULTRASOUND-GUIDED  PARACENTESIS ULTRASOUND-GUIDED HEPATIC ABSCESS ASPIRATION MEDICATIONS: The patient is currently admitted to the hospital and receiving intravenous antibiotics. ANESTHESIA/SEDATION: Fentanyl 12.5 mcg IV; Versed 0.5 mg IV Moderate Sedation Time:  12 minutes The patient was continuously monitored during the procedure by the interventional radiology nurse under my direct supervision. COMPLICATIONS: None immediate. PROCEDURE: Informed written consent was obtained from the patient's son after a thorough discussion of the procedural risks, benefits and alternatives. All questions were addressed. Maximal Sterile Barrier Technique was utilized including caps, mask, sterile gowns, sterile gloves, sterile drape, hand hygiene and skin antiseptic. A timeout was performed prior to the initiation of the procedure. Patient was placed supine on the interventional table. Ultrasound was used to evaluate the abdomen. New perihepatic ascites was identified. Skin was anesthetized with 1% lidocaine. Using ultrasound guidance, a 19 gauge Yueh catheter was directed into the perihepatic space and a paracentesis was performed. The paracentesis catheter was kept in place as attention was directed to the intrahepatic lesion. Ultrasound demonstrated irregular hypoechoic areas in liver concerning for intrahepatic abscess. After the perihepatic ascites had been removed, a new 19 gauge Yueh catheter was directed into the liver and into the hypoechoic hepatic area with ultrasound guidance. Tan colored purulent fluid was aspirated. Approximately 10 mL of purulent fluid was removed from the liver. Following the aspiration, there was no significant collection remaining. Stiff Amplatz wire was advanced into the liver but with the wire was not forming within a well-defined collection. Drain was not placed. Yueh catheter was removed. Small amount of bloody material was removed from the paracentesis catheter prior to removal. Bandage placed over the  puncture sites. FINDINGS: New perihepatic ascites. Approximately 220 mL of predominantly yellow fluid was removed from the perihepatic space. Small amount of blood was aspirated from the paracentesis catheter after the liver aspiration. Small irregular hypoechoic collections in the liver. Approximately 10 mL of purulent fluid was removed from these collections and findings are compatible with a poorly formed hepatic abscess. The hepatic abscess was decompressed following aspiration. IMPRESSION: 1. Ultrasound-guided aspiration of the intrahepatic abscess/abscesses. Total of 10 mL of purulent fluid was removed. There was not a well-defined collection within the liver based on wire placement. In addition, the liver abscess appeared to be completely decompressed by the end of the procedure. Therefore, hepatic drain was not placed. In addition, there is concern about placing an intrahepatic drain in the setting of ascites. Abscess fluid was sent for culture. 2. Ultrasound-guided paracentesis. Approximately 220 mL of peritoneal fluid was removed. Fluid was sent for analysis. Electronically Signed   By: Richarda OverlieAdam  Henn M.D.   On: 10/10/2019 15:12   Ir Paracentesis  Result Date: 10/10/2019 INDICATION: 73 year old with bacteremia and concern for cholangitis and hepatic abscess. Cystic duct was patent on recent HIDA examination. EXAM: ULTRASOUND-GUIDED PARACENTESIS ULTRASOUND-GUIDED HEPATIC ABSCESS ASPIRATION MEDICATIONS: The patient is currently admitted to the hospital and receiving intravenous antibiotics. ANESTHESIA/SEDATION: Fentanyl 12.5 mcg IV; Versed 0.5 mg IV Moderate Sedation Time:  12 minutes The patient was continuously monitored during the procedure by the interventional radiology nurse under my direct supervision. COMPLICATIONS: None  immediate. PROCEDURE: Informed written consent was obtained from the patient's son after a thorough discussion of the procedural risks, benefits and alternatives. All questions were  addressed. Maximal Sterile Barrier Technique was utilized including caps, mask, sterile gowns, sterile gloves, sterile drape, hand hygiene and skin antiseptic. A timeout was performed prior to the initiation of the procedure. Patient was placed supine on the interventional table. Ultrasound was used to evaluate the abdomen. New perihepatic ascites was identified. Skin was anesthetized with 1% lidocaine. Using ultrasound guidance, a 19 gauge Yueh catheter was directed into the perihepatic space and a paracentesis was performed. The paracentesis catheter was kept in place as attention was directed to the intrahepatic lesion. Ultrasound demonstrated irregular hypoechoic areas in liver concerning for intrahepatic abscess. After the perihepatic ascites had been removed, a new 19 gauge Yueh catheter was directed into the liver and into the hypoechoic hepatic area with ultrasound guidance. Tan colored purulent fluid was aspirated. Approximately 10 mL of purulent fluid was removed from the liver. Following the aspiration, there was no significant collection remaining. Stiff Amplatz wire was advanced into the liver but with the wire was not forming within a well-defined collection. Drain was not placed. Yueh catheter was removed. Small amount of bloody material was removed from the paracentesis catheter prior to removal. Bandage placed over the puncture sites. FINDINGS: New perihepatic ascites. Approximately 220 mL of predominantly yellow fluid was removed from the perihepatic space. Small amount of blood was aspirated from the paracentesis catheter after the liver aspiration. Small irregular hypoechoic collections in the liver. Approximately 10 mL of purulent fluid was removed from these collections and findings are compatible with a poorly formed hepatic abscess. The hepatic abscess was decompressed following aspiration. IMPRESSION: 1. Ultrasound-guided aspiration of the intrahepatic abscess/abscesses. Total of 10 mL of  purulent fluid was removed. There was not a well-defined collection within the liver based on wire placement. In addition, the liver abscess appeared to be completely decompressed by the end of the procedure. Therefore, hepatic drain was not placed. In addition, there is concern about placing an intrahepatic drain in the setting of ascites. Abscess fluid was sent for culture. 2. Ultrasound-guided paracentesis. Approximately 220 mL of peritoneal fluid was removed. Fluid was sent for analysis. Electronically Signed   By: Richarda Overlie M.D.   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 Will narrow to cetriaxone 2gm iv daily plus repeat peripheral blood cx, and follow up on cx Given hx of pacemaker, will get TEE. Has been scheduled for tomorrow.   Liver abscess= continue on ceftriaxone. transaminitis appear improving  [facility] for Infectious Diseases Cell: [phone] Pager: [phone]  10/11/2019, 4:27 PM

## 2019-10-11 NOTE — Progress Notes (Addendum)
Pts QTc this morning is 0.53.  Relayed to Hospitalist, spoke w/ pharmacist who states tambocor could be causing. Will finish replacing electrolytes, recheck this afternoon, continue tambocor, continue to monitor for now.

## 2019-10-11 NOTE — Progress Notes (Signed)
PROGRESS NOTE    Courtney [name]  UYQ:[id number] DOB: May 18, 1946 DOA: 10/06/2019 PCP: Patient, No Pcp Per    Brief Narrative:  Edwin H Nicolini is a [place] speaking 73 year old female with past medical history significant for type 2 diabetes, atrial fibrillation, tachybradycardia syndrome who was brought into the emergency department with chief complaint of altered mental status.  In the emergency department, there was concern for meningitis and patient was started on vancomycin, ampicillin, ceftriaxone.  LP not pursued in the emergency department as patient was on Eliquis and was unclear when her last dose was.  She was also found in DKA and was started on IV insulin.  During her hospitalization, she was found to have Klebsiella bacteremia, ESBL UTI, multifocal pneumonia.  Her mentation continued to improve and as she had source for her sepsis and altered mental status, LP was canceled.  On 10/26, rapid response was called due to acute respiratory distress.  Patient was transferred to the ICU with PCCM consulted. Due to concern for cholangitis, GI, IR, general surgery consulted as well.   No evidence of acute cholecystitis on HIDA scan.  No surgery planned.  MRI was not possible due to pacemaker.  She is status post paracentesis and hepatic abscess aspiration by interventional radiology.  Cultures pending at this time.  New events last 24 hours / Subjective: Patient denies any abdominal pain, or nausea. WBC count still elevated.  Assessment & Plan:   Active Problems:   Altered mental status   AMS (altered mental status)   Acute metabolic encephalopathy -Multifactorial in setting of DKA as well as underlying sepsis - Mental status back to baseline   Severe sepsis secondary to Klebsiella bacteremia, ESBL UTI, multifocal pneumonia, concern for cholangitis, liver abscesses  -At the time of admission there was concern for meningitis due to patient's fever, altered mental status, headache.  LP was not  pursued at admission due to patient being on Eliquis.  Son confirms that patient's last Eliquis dose was 2 days prior.  LP not further pursued as patient has other source of infection to explain her presenting symptoms.  Meningitis is low on the differential. -Blood culture showed Klebsiella -CT abdomen pelvis revealed diffuse gallbladder edema, pericholecystic fluid, concern for intrahepatic abscesses, concern for cholangitis  -MRCP unable to be completed due to pacemaker leads -Urine culture returned with ESBL EColi and staph epidermidis --> antibiotics changed to [facility] 10/26 -GI following  -General surgery following, no role for surgery at this time -IR consulted for liver abscess aspiration vs drain placement  -HIDA negative for cholecystitis -ID consulted for antibiotic assistance 10/11/2019: Patient status post paracentesis and hepatic abscess aspiration by interventional radiology yesterday. -WBC count still elevated.  Continue to monitor. -Infectious disease recommended a TEE to evaluate pacer wires for vegetation.  Appreciate ID help.  Hepatitis  -Hep A IgM NR, Ab reactive -Hep B surface Ag NR, IgM NR,Core Ab reactive -Hep C Ab NR   -Patient reports that she has had hepatitis in the past, unable to give other history regarding this    Acute hypoxemic respiratory failure -Required nonrebreather on 10/26 -PCCM consulted, signed off 10/28  -Currently on room air  Elevated liver enzymes -Improving, trend   Paroxysmal atrial fibrillation with RVR -Patient was started on Cardizem drip at the time of admission -Currently in normal sinus rhythm with rate control  Tachybradycardia syndrome -Pacemaker placed 2018 -Continue flecainide, metoprolol  DKA -Hemoglobin A1c 16.4 -Patient required IV insulin during hospitalization and has now been  transitioned to Lantus and sliding scale insulin - Continue to monitor accuchecks  Hypokalemia -Potassium placement  ordered. -Continue to monitor. -We will also monitor magnesium level.    DVT prophylaxis: SCD (Eliquis PTA) Code Status: Full code Family Communication: None at bedside  Disposition Plan: Pending further work-up, clinical improvement    Consultants:   GI  PCCM  GI  IR  ID   Procedures:   Paracentesis and hepatic aspiration by interventional radiology (10/10/2019)  Subjective: No acute events.  WBC count continues to remain elevated.  Objective: Vitals:   10/11/19 0344 10/11/19 0400 10/11/19 0500 10/11/19 0600  BP:  (!) 107/54 97/65 (!) 99/49  Pulse:  64 64 70  Resp:  (!) 23 19 (!) 24  Temp: (!) 97.4 F (36.3 C)     TempSrc: Oral     SpO2:  97% 94% 94%  Weight:      Height:        Intake/Output Summary (Last 24 hours) at 10/11/2019 0857 Last data filed at 10/11/2019 0800 Gross per 24 hour  Intake 2934.4 ml  Output 975 ml  Net 1959.4 ml   Filed Weights   10/06/19 1503 10/07/19 0142 10/08/19 0641  Weight: 54.4 kg 45.2 kg 47.7 kg    Examination:  General exam: Appears calm and comfortable  Respiratory system: Decreased breath sounds lower lobes otherwise clear to auscultation. Respiratory effort normal. Cardiovascular system: S1 & S2 heard, RRR. No pedal edema. Gastrointestinal system: Mild distention, mild right upper quadrant tenderness without any guarding or rebound. Normal bowel sounds heard. Central nervous system: Alert and oriented. No focal neurological deficits. Extremities: No edema Skin: No rashes Psychiatry: Mood & affect appropriate.     Data Reviewed: I have personally reviewed following labs and imaging studies  CBC: Recent Labs  Lab 10/06/19 1240 10/07/19 0429 10/08/19 0531 10/09/19 0459 10/10/19 0419 10/11/19 0233  WBC 14.9* 7.3 10.4 12.9* 20.6* 20.8*  NEUTROABS 12.1*  --   --   --   --   --   HGB 11.9* 10.5* 10.6* 10.5* 13.0 11.9*  HCT 37.1 30.4* 29.8* 29.7* 36.2 34.1*  MCV 97.6 89.4 87.6 88.1 87.2 88.3  PLT  101* 55* 43* 33* 60* 98*   Basic Metabolic Panel: Recent Labs  Lab 10/07/19 0429 10/08/19 0531 10/08/19 1603 10/09/19 0459 10/10/19 0419 10/11/19 0233  NA 137 134* 135 137 137 138  K 2.1* 3.4* 3.7 4.0 4.5 3.2*  CL 110 109 111 115* 112* 112*  CO2 15* 14* 12* 15* 15* 18*  GLUCOSE 95 162* 205* 129* 118* 80  BUN 12 24* 23 25* 30* 23  CREATININE 0.73 0.76 0.83 0.68 0.72 0.82  CALCIUM 8.0* 7.8* 7.9* 7.8* 7.8* 7.4*  MG 1.6* 2.1 1.8 1.9  --   --    GFR: Estimated Creatinine Clearance: 46.7 mL/min (by C-G formula based on SCr of 0.82 mg/dL). Liver Function Tests: Recent Labs  Lab 10/07/19 0429 10/08/19 0531 10/09/19 0459 10/10/19 0419 10/11/19 0233  AST 56* 2,353* 995* 293* 273*  ALT 59* 885* 794* 481* 370*  ALKPHOS 99 98 109 114 122  BILITOT 0.5 0.9 1.2 1.2 1.0  PROT 5.4* 5.4* 5.0* 4.6* 4.4*  ALBUMIN 2.0* 2.0* 1.8* 1.5* 1.5*   No results for input(s): LIPASE, AMYLASE in the last 168 hours. No results for input(s): AMMONIA in the last 168 hours. Coagulation Profile: Recent Labs  Lab 10/06/19 1240 10/08/19 1215 10/09/19 1102  INR 1.2 1.3* 1.3*   Cardiac Enzymes: Recent Labs  Lab 10/09/19 0633  CKTOTAL 41   BNP (last 3 results) No results for input(s): PROBNP in the last 8760 hours. HbA1C: No results for input(s): HGBA1C in the last 72 hours. CBG: Recent Labs  Lab 10/10/19 1521 10/10/19 1944 10/10/19 2313 10/11/19 0316 10/11/19 0808  GLUCAP 88 167* 83 68* 102*   Lipid Profile: No results for input(s): CHOL, HDL, LDLCALC, TRIG, CHOLHDL, LDLDIRECT in the last 72 hours. Thyroid Function Tests: Recent Labs    10/08/19 1603  TSH 0.894   Anemia Panel: No results for input(s): VITAMINB12, FOLATE, FERRITIN, TIBC, IRON, RETICCTPCT in the last 72 hours. Sepsis Labs: Recent Labs  Lab 10/07/19 1039 10/08/19 1611 10/08/19 2236 10/09/19 1102  LATICACIDVEN 1.5 4.3* 2.7* 1.7    Recent Results (from the past 240 hour(s))  Blood Culture (routine x 2)      Status: Abnormal   Collection Time: 10/06/19 12:40 PM   Specimen: BLOOD  Result Value Ref Range Status   Specimen Description BLOOD RIGHT ANTECUBITAL  Final   Special Requests   Final    BOTTLES DRAWN AEROBIC AND ANAEROBIC Blood Culture results may not be optimal due to an inadequate volume of blood received in culture bottles   Culture  Setup Time   Final    IN BOTH AEROBIC AND ANAEROBIC BOTTLES GRAM NEGATIVE RODS CRITICAL RESULT CALLED TO, READ BACK BY AND VERIFIED WITH: Evelena Peat [facility] 10/07/19 0419 JDW Performed at [facility], [address], [place], [place] [address]    Culture KLEBSIELLA PNEUMONIAE (A)  Final   Report Status 10/09/2019 FINAL  Final   Organism ID, Bacteria KLEBSIELLA PNEUMONIAE  Final      Susceptibility   Klebsiella pneumoniae - MIC*    AMPICILLIN >=32 RESISTANT Resistant     CEFAZOLIN <=4 SENSITIVE Sensitive     CEFEPIME <=1 SENSITIVE Sensitive     CEFTAZIDIME <=1 SENSITIVE Sensitive     CEFTRIAXONE <=1 SENSITIVE Sensitive     CIPROFLOXACIN <=0.25 SENSITIVE Sensitive     GENTAMICIN <=1 SENSITIVE Sensitive     IMIPENEM 0.5 SENSITIVE Sensitive     TRIMETH/SULFA <=20 SENSITIVE Sensitive     AMPICILLIN/SULBACTAM 8 SENSITIVE Sensitive     PIP/TAZO <=4 SENSITIVE Sensitive     Extended ESBL NEGATIVE Sensitive     * KLEBSIELLA PNEUMONIAE  Blood Culture ID Panel (Reflexed)     Status: Abnormal   Collection Time: 10/06/19 12:40 PM  Result Value Ref Range Status   Enterococcus species NOT DETECTED NOT DETECTED Final   Listeria monocytogenes NOT DETECTED NOT DETECTED Final   Staphylococcus species NOT DETECTED NOT DETECTED Final   Staphylococcus aureus (BCID) NOT DETECTED NOT DETECTED Final   Streptococcus species NOT DETECTED NOT DETECTED Final   Streptococcus agalactiae NOT DETECTED NOT DETECTED Final   Streptococcus pneumoniae NOT DETECTED NOT DETECTED Final   Streptococcus pyogenes NOT DETECTED NOT DETECTED Final   Acinetobacter baumannii NOT DETECTED  NOT DETECTED Final   Enterobacteriaceae species DETECTED (A) NOT DETECTED Final    Comment: Enterobacteriaceae represent a large family of gram-negative bacteria, not a single organism. CRITICAL RESULT CALLED TO, READ BACK BY AND VERIFIED WITH: G ABBOTT PHARMD 10/07/19 0419 JDW    Enterobacter cloacae complex NOT DETECTED NOT DETECTED Final   Escherichia coli NOT DETECTED NOT DETECTED Final   Klebsiella oxytoca NOT DETECTED NOT DETECTED Final   Klebsiella pneumoniae DETECTED (A) NOT DETECTED Final    Comment: CRITICAL RESULT CALLED TO, READ BACK BY AND VERIFIED WITH: G  ABBOTT PHARMD 10/07/19 0419 JDW    Proteus species NOT DETECTED NOT DETECTED Final   Serratia marcescens NOT DETECTED NOT DETECTED Final   Carbapenem resistance NOT DETECTED NOT DETECTED Final   Haemophilus influenzae NOT DETECTED NOT DETECTED Final   Neisseria meningitidis NOT DETECTED NOT DETECTED Final   Pseudomonas aeruginosa NOT DETECTED NOT DETECTED Final   Candida albicans NOT DETECTED NOT DETECTED Final   Candida glabrata NOT DETECTED NOT DETECTED Final   Candida krusei NOT DETECTED NOT DETECTED Final   Candida parapsilosis NOT DETECTED NOT DETECTED Final   Candida tropicalis NOT DETECTED NOT DETECTED Final    Comment: Performed at [facility], [address], [place], [place] [address]  Blood Culture (routine x 2)     Status: Abnormal   Collection Time: 10/06/19 12:45 PM   Specimen: BLOOD RIGHT HAND  Result Value Ref Range Status   Specimen Description BLOOD RIGHT HAND  Final   Special Requests   Final    BOTTLES DRAWN AEROBIC AND ANAEROBIC Blood Culture results may not be optimal due to an inadequate volume of blood received in culture bottles   Culture  Setup Time   Final    IN BOTH AEROBIC AND ANAEROBIC BOTTLES GRAM NEGATIVE RODS CRITICAL VALUE NOTED.  VALUE IS CONSISTENT WITH PREVIOUSLY REPORTED AND CALLED VALUE.    Culture (A)  Final    KLEBSIELLA PNEUMONIAE SUSCEPTIBILITIES PERFORMED ON  PREVIOUS CULTURE WITHIN THE LAST 5 DAYS. Performed at [facility], [address] [place], [place] [address]    Report Status 10/09/2019 FINAL  Final  SARS CORONAVIRUS 2 (TAT 6-24 HRS) Nasopharyngeal Nasopharyngeal Swab     Status: None   Collection Time: 10/06/19  3:35 PM   Specimen: Nasopharyngeal Swab  Result Value Ref Range Status   SARS Coronavirus 2 NEGATIVE NEGATIVE Final    Comment: (NOTE) SARS-CoV-2 target nucleic acids are NOT DETECTED. The SARS-CoV-2 RNA is generally detectable in upper and lower respiratory specimens during the acute phase of infection. Negative results do not preclude SARS-CoV-2 infection, do not rule out co-infections with other pathogens, and should not be used as the sole basis for treatment or other patient management decisions. Negative results must be combined with clinical observations, patient history, and epidemiological information. The expected result is Negative. Fact Sheet for Patients: HairSlick.no Fact Sheet for Healthcare Providers: quierodirigir.com This test is not yet approved or cleared by the [place] FDA and  has been authorized for detection and/or diagnosis of SARS-CoV-2 by FDA under an Emergency Use Authorization (EUA). This EUA will remain  in effect (meaning this test can be used) for the duration of the COVID-19 declaration under Section 56 4(b)(1) of the Act, 21 U.S.C. section 360bbb-3(b)(1), unless the authorization is terminated or revoked sooner. Performed at [facility], [address], [place], [place] [address]   Urine culture     Status: Abnormal   Collection Time: 10/06/19  4:02 PM   Specimen: In/Out Cath Urine  Result Value Ref Range Status   Specimen Description IN/OUT CATH URINE  Final   Special Requests   Final    NONE Performed at [facility], [address] [place], [place] [address]    Culture (A)  Final    30,000  COLONIES/mL ESCHERICHIA COLI Confirmed Extended Spectrum Beta-Lactamase Producer (ESBL).  In bloodstream infections from ESBL organisms, carbapenems are preferred over piperacillin/tazobactam. They are shown to have a lower risk of mortality. 10,000 COLONIES/mL STAPHYLOCOCCUS  EPIDERMIDIS    Report Status 10/08/2019 FINAL  Final   Organism ID, Bacteria ESCHERICHIA COLI (A)  Final   Organism ID, Bacteria STAPHYLOCOCCUS EPIDERMIDIS (A)  Final      Susceptibility   Escherichia coli - MIC*    AMPICILLIN >=32 RESISTANT Resistant     CEFAZOLIN >=64 RESISTANT Resistant     CEFTRIAXONE >=64 RESISTANT Resistant     CIPROFLOXACIN >=4 RESISTANT Resistant     GENTAMICIN <=1 SENSITIVE Sensitive     IMIPENEM <=0.25 SENSITIVE Sensitive     NITROFURANTOIN <=16 SENSITIVE Sensitive     TRIMETH/SULFA <=20 SENSITIVE Sensitive     AMPICILLIN/SULBACTAM 4 SENSITIVE Sensitive     PIP/TAZO <=4 SENSITIVE Sensitive     Extended ESBL POSITIVE Resistant     * 30,000 COLONIES/mL ESCHERICHIA COLI   Staphylococcus epidermidis - MIC*    CIPROFLOXACIN 2 INTERMEDIATE Intermediate     GENTAMICIN <=0.5 SENSITIVE Sensitive     NITROFURANTOIN <=16 SENSITIVE Sensitive     OXACILLIN >=4 RESISTANT Resistant     TETRACYCLINE <=1 SENSITIVE Sensitive     VANCOMYCIN 1 SENSITIVE Sensitive     TRIMETH/SULFA <=10 SENSITIVE Sensitive     CLINDAMYCIN <=0.25 SENSITIVE Sensitive     RIFAMPIN <=0.5 SENSITIVE Sensitive     Inducible Clindamycin NEGATIVE Sensitive     * 10,000 COLONIES/mL STAPHYLOCOCCUS EPIDERMIDIS  MRSA PCR Screening     Status: None   Collection Time: 10/08/19 10:50 PM   Specimen: Nasal Mucosa; Nasopharyngeal  Result Value Ref Range Status   MRSA by PCR NEGATIVE NEGATIVE Final    Comment:        The GeneXpert MRSA Assay (FDA approved for NASAL specimens only), is one component of a comprehensive MRSA colonization surveillance program. It is not intended to diagnose MRSA infection nor to guide or monitor  treatment for MRSA infections. Performed at [facility], [address] [place], [place] [address]   Body fluid culture     Status: None (Preliminary result)   Collection Time: 10/10/19 12:59 PM   Specimen: A: Abdomen; Peritoneal Fluid   B: Abdomen; Peritoneal Fluid  Result Value Ref Range Status   Specimen Description ABDOMEN  Final   Special Requests NONE  Final   Gram Stain   Final    FEW WBC PRESENT,BOTH PMN AND MONONUCLEAR NO ORGANISMS SEEN Performed at [facility], [address] [place], [place] [address]    Culture PENDING  Incomplete   Report Status PENDING  Incomplete  Aerobic/Anaerobic Culture (surgical/deep wound)     Status: None (Preliminary result)   Collection Time: 10/10/19  2:26 PM   Specimen: Abscess  Result Value Ref Range Status   Specimen Description ABSCESS LIVER  Final   Special Requests NONE  Final   Gram Stain   Final    ABUNDANT WBC PRESENT,BOTH PMN AND MONONUCLEAR FEW GRAM VARIABLE ROD RARE GRAM NEGATIVE RODS Performed at [facility], [address], [place], [place] [address]    Culture PENDING  Incomplete   Report Status PENDING  Incomplete         Radiology Studies: Nm Hepatobiliary Liver Func  Result Date: 10/09/2019 CLINICAL DATA:  Upper abdominal pain and abnormal appearing gallbladder on recent ultrasound examination EXAM: NUCLEAR MEDICINE HEPATOBILIARY IMAGING TECHNIQUE: Sequential images of the abdomen were obtained out to 80 minutes following intravenous administration of radiopharmaceutical. RADIOPHARMACEUTICALS:  5.3 mCi Tc-50m  Choletec IV COMPARISON:  Ultrasound right upper quadrant October 08, 2019 FINDINGS: Liver  uptake of radiotracer is unremarkable. There is visualization of small bowel indicating patency of the common bile duct. Gallbladder visualizes, although somewhat delayed compared to visualization of the small bowel. IMPRESSION: There is visualization of the gallbladder which does indicate patency of  the cystic duct. Relative delay in visualization of the gallbladder compared to small bowel may be a finding indicative of chronic cholecystitis. Common bile duct is patent as is evidenced by visualization of the small bowel. Comment: Note that radiotracer is seen in the right upper extremity due to apparent infiltration of intravenous access site earlier in the course of the study. Electronically Signed   By: Bretta Bang III M.D.   On: 10/09/2019 16:58   Ir US Guide Bx Asp/drain  Result Date: 10/10/2019 INDICATION: 73 year old with bacteremia and concern for cholangitis and hepatic abscess. Cystic duct was patent on recent HIDA examination. EXAM: ULTRASOUND-GUIDED PARACENTESIS ULTRASOUND-GUIDED HEPATIC ABSCESS ASPIRATION MEDICATIONS: The patient is currently admitted to the hospital and receiving intravenous antibiotics. ANESTHESIA/SEDATION: Fentanyl 12.5 mcg IV; Versed 0.5 mg IV Moderate Sedation Time:  12 minutes The patient was continuously monitored during the procedure by the interventional radiology nurse under my direct supervision. COMPLICATIONS: None immediate. PROCEDURE: Informed written consent was obtained from the patient's son after a thorough discussion of the procedural risks, benefits and alternatives. All questions were addressed. Maximal Sterile Barrier Technique was utilized including caps, mask, sterile gowns, sterile gloves, sterile drape, hand hygiene and skin antiseptic. A timeout was performed prior to the initiation of the procedure. Patient was placed supine on the interventional table. Ultrasound was used to evaluate the abdomen. New perihepatic ascites was identified. Skin was anesthetized with 1% lidocaine. Using ultrasound guidance, a 19 gauge Yueh catheter was directed into the perihepatic space and a paracentesis was performed. The paracentesis catheter was kept in place as attention was directed to the intrahepatic lesion. Ultrasound demonstrated irregular hypoechoic  areas in liver concerning for intrahepatic abscess. After the perihepatic ascites had been removed, a new 19 gauge Yueh catheter was directed into the liver and into the hypoechoic hepatic area with ultrasound guidance. Tan colored purulent fluid was aspirated. Approximately 10 mL of purulent fluid was removed from the liver. Following the aspiration, there was no significant collection remaining. Stiff Amplatz wire was advanced into the liver but with the wire was not forming within a well-defined collection. Drain was not placed. Yueh catheter was removed. Small amount of bloody material was removed from the paracentesis catheter prior to removal. Bandage placed over the puncture sites. FINDINGS: New perihepatic ascites. Approximately 220 mL of predominantly yellow fluid was removed from the perihepatic space. Small amount of blood was aspirated from the paracentesis catheter after the liver aspiration. Small irregular hypoechoic collections in the liver. Approximately 10 mL of purulent fluid was removed from these collections and findings are compatible with a poorly formed hepatic abscess. The hepatic abscess was decompressed following aspiration. IMPRESSION: 1. Ultrasound-guided aspiration of the intrahepatic abscess/abscesses. Total of 10 mL of purulent fluid was removed. There was not a well-defined collection within the liver based on wire placement. In addition, the liver abscess appeared to be completely decompressed by the end of the procedure. Therefore, hepatic drain was not placed. In addition, there is concern about placing an intrahepatic drain in the setting of ascites. Abscess fluid was sent for culture. 2. Ultrasound-guided paracentesis. Approximately 220 mL of peritoneal fluid was removed. Fluid was sent for analysis. Electronically Signed   By: Richarda Overlie M.D.   On:  10/10/2019 15:12   Ir Paracentesis  Result Date: 10/10/2019 INDICATION: 73 year old with bacteremia and concern for  cholangitis and hepatic abscess. Cystic duct was patent on recent HIDA examination. EXAM: ULTRASOUND-GUIDED PARACENTESIS ULTRASOUND-GUIDED HEPATIC ABSCESS ASPIRATION MEDICATIONS: The patient is currently admitted to the hospital and receiving intravenous antibiotics. ANESTHESIA/SEDATION: Fentanyl 12.5 mcg IV; Versed 0.5 mg IV Moderate Sedation Time:  12 minutes The patient was continuously monitored during the procedure by the interventional radiology nurse under my direct supervision. COMPLICATIONS: None immediate. PROCEDURE: Informed written consent was obtained from the patient's son after a thorough discussion of the procedural risks, benefits and alternatives. All questions were addressed. Maximal Sterile Barrier Technique was utilized including caps, mask, sterile gowns, sterile gloves, sterile drape, hand hygiene and skin antiseptic. A timeout was performed prior to the initiation of the procedure. Patient was placed supine on the interventional table. Ultrasound was used to evaluate the abdomen. New perihepatic ascites was identified. Skin was anesthetized with 1% lidocaine. Using ultrasound guidance, a 19 gauge Yueh catheter was directed into the perihepatic space and a paracentesis was performed. The paracentesis catheter was kept in place as attention was directed to the intrahepatic lesion. Ultrasound demonstrated irregular hypoechoic areas in liver concerning for intrahepatic abscess. After the perihepatic ascites had been removed, a new 19 gauge Yueh catheter was directed into the liver and into the hypoechoic hepatic area with ultrasound guidance. Tan colored purulent fluid was aspirated. Approximately 10 mL of purulent fluid was removed from the liver. Following the aspiration, there was no significant collection remaining. Stiff Amplatz wire was advanced into the liver but with the wire was not forming within a well-defined collection. Drain was not placed. Yueh catheter was removed. Small amount of  bloody material was removed from the paracentesis catheter prior to removal. Bandage placed over the puncture sites. FINDINGS: New perihepatic ascites. Approximately 220 mL of predominantly yellow fluid was removed from the perihepatic space. Small amount of blood was aspirated from the paracentesis catheter after the liver aspiration. Small irregular hypoechoic collections in the liver. Approximately 10 mL of purulent fluid was removed from these collections and findings are compatible with a poorly formed hepatic abscess. The hepatic abscess was decompressed following aspiration. IMPRESSION: 1. Ultrasound-guided aspiration of the intrahepatic abscess/abscesses. Total of 10 mL of purulent fluid was removed. There was not a well-defined collection within the liver based on wire placement. In addition, the liver abscess appeared to be completely decompressed by the end of the procedure. Therefore, hepatic drain was not placed. In addition, there is concern about placing an intrahepatic drain in the setting of ascites. Abscess fluid was sent for culture. 2. Ultrasound-guided paracentesis. Approximately 220 mL of peritoneal fluid was removed. Fluid was sent for analysis. Electronically Signed   By: Richarda OverlieAdam  Henn M.D.   On: 10/10/2019 15:12   Scheduled Meds:  chlorhexidine  15 mL Mouth Rinse BID   Chlorhexidine Gluconate Cloth  6 each Topical Daily   flecainide  50 mg Oral BID   insulin aspart  0-20 Units Subcutaneous Q4H   insulin glargine  10 Units Subcutaneous Daily   mouth rinse  15 mL Mouth Rinse q12n4p   pantoprazole (PROTONIX) IV  40 mg Intravenous Q24H   potassium chloride  30 mEq Oral Q4H   Continuous Infusions:  sodium chloride Stopped (10/09/19 0959)   lactated ringers 125 mL/hr at 10/11/19 0847   meropenem (MERREM) IV Stopped (10/10/19 2212)     LOS: 5 days    Vonzella NippleAnupama Ayad Nieman, MD [facility] Pager on amion  If 7PM-7AM, please contact  night-coverage [email or web address] Password [email or web address] 10/11/2019, 8:57 AM

## 2019-10-11 NOTE — Progress Notes (Signed)
Patient ID: Courtney [name], female   DOB: 03-18-1946, 73 y.o.   MRN: [id number]       Subjective: No new complaints this morning.  Ate all of her breakfast.  Had her liver abscess aspirated yesterday.  cx pending, but shows gram - rods  ROS: See above, otherwise other systems negative  Objective: Vital signs in last 24 hours: Temp:  [97.4 F (36.3 C)-97.8 F (36.6 C)] 97.4 F (36.3 C) (10/29 0344) Pulse Rate:  [29-83] 70 (10/29 0600) Resp:  [12-26] 24 (10/29 0600) BP: (84-112)/(49-96) 99/49 (10/29 0600) SpO2:  [94 %-99 %] 94 % (10/29 0600) Last BM Date: 10/11/19  Intake/Output from previous day: 10/28 0701 - 10/29 0700 In: 2914.3 [P.O.:120; I.V.:2594.3; IV Piggyback:200] Out: 975 [Urine:975] Intake/Output this shift: Total I/O In: 120 [P.O.:120] Out: -   PE: Abd: soft, minimally tender in RUQ, otherwise benign with active BS, minimal bloating.  Lab Results:  Recent Labs    10/10/19 0419 10/11/19 0233  WBC 20.6* 20.8*  HGB 13.0 11.9*  HCT 36.2 34.1*  PLT 60* 98*   BMET Recent Labs    10/10/19 0419 10/11/19 0233  NA 137 138  K 4.5 3.2*  CL 112* 112*  CO2 15* 18*  GLUCOSE 118* 80  BUN 30* 23  CREATININE 0.72 0.82  CALCIUM 7.8* 7.4*   PT/INR Recent Labs    10/08/19 1215 10/09/19 1102  LABPROT 16.0* 16.4*  INR 1.3* 1.3*   CMP     Component Value Date/Time   NA 138 10/11/2019 0233   NA 132 (L) 08/29/2018 1655   K 3.2 (L) 10/11/2019 0233   CL 112 (H) 10/11/2019 0233   CO2 18 (L) 10/11/2019 0233   GLUCOSE 80 10/11/2019 0233   BUN 23 10/11/2019 0233   BUN 26 08/29/2018 1655   CREATININE 0.82 10/11/2019 0233   CREATININE 0.62 12/07/2013 1256   CALCIUM 7.4 (L) 10/11/2019 0233   PROT 4.4 (L) 10/11/2019 0233   ALBUMIN 1.5 (L) 10/11/2019 0233   AST 273 (H) 10/11/2019 0233   ALT 370 (H) 10/11/2019 0233   ALKPHOS 122 10/11/2019 0233   BILITOT 1.0 10/11/2019 0233   GFRNONAA >60 10/11/2019 0233   GFRAA >60 10/11/2019 0233   Lipase     Component Value  Date/Time   LIPASE 34 09/04/2017 1054       Studies/Results: Nm Hepatobiliary Liver Func  Result Date: 10/09/2019 CLINICAL DATA:  Upper abdominal pain and abnormal appearing gallbladder on recent ultrasound examination EXAM: NUCLEAR MEDICINE HEPATOBILIARY IMAGING TECHNIQUE: Sequential images of the abdomen were obtained out to 80 minutes following intravenous administration of radiopharmaceutical. RADIOPHARMACEUTICALS:  5.3 mCi Tc-76m  Choletec IV COMPARISON:  Ultrasound right upper quadrant October 08, 2019 FINDINGS: Liver uptake of radiotracer is unremarkable. There is visualization of small bowel indicating patency of the common bile duct. Gallbladder visualizes, although somewhat delayed compared to visualization of the small bowel. IMPRESSION: There is visualization of the gallbladder which does indicate patency of the cystic duct. Relative delay in visualization of the gallbladder compared to small bowel may be a finding indicative of chronic cholecystitis. Common bile duct is patent as is evidenced by visualization of the small bowel. Comment: Note that radiotracer is seen in the right upper extremity due to apparent infiltration of intravenous access site earlier in the course of the study. Electronically Signed   By: Bretta Bang III M.D.   On: 10/09/2019 16:58   Ir US Guide Bx Asp/drain  Result Date: 10/10/2019 INDICATION:  73 year old with bacteremia and concern for cholangitis and hepatic abscess. Cystic duct was patent on recent HIDA examination. EXAM: ULTRASOUND-GUIDED PARACENTESIS ULTRASOUND-GUIDED HEPATIC ABSCESS ASPIRATION MEDICATIONS: The patient is currently admitted to the hospital and receiving intravenous antibiotics. ANESTHESIA/SEDATION: Fentanyl 12.5 mcg IV; Versed 0.5 mg IV Moderate Sedation Time:  12 minutes The patient was continuously monitored during the procedure by the interventional radiology nurse under my direct supervision. COMPLICATIONS: None immediate.  PROCEDURE: Informed written consent was obtained from the patient's son after a thorough discussion of the procedural risks, benefits and alternatives. All questions were addressed. Maximal Sterile Barrier Technique was utilized including caps, mask, sterile gowns, sterile gloves, sterile drape, hand hygiene and skin antiseptic. A timeout was performed prior to the initiation of the procedure. Patient was placed supine on the interventional table. Ultrasound was used to evaluate the abdomen. New perihepatic ascites was identified. Skin was anesthetized with 1% lidocaine. Using ultrasound guidance, a 19 gauge Yueh catheter was directed into the perihepatic space and a paracentesis was performed. The paracentesis catheter was kept in place as attention was directed to the intrahepatic lesion. Ultrasound demonstrated irregular hypoechoic areas in liver concerning for intrahepatic abscess. After the perihepatic ascites had been removed, a new 19 gauge Yueh catheter was directed into the liver and into the hypoechoic hepatic area with ultrasound guidance. Tan colored purulent fluid was aspirated. Approximately 10 mL of purulent fluid was removed from the liver. Following the aspiration, there was no significant collection remaining. Stiff Amplatz wire was advanced into the liver but with the wire was not forming within a well-defined collection. Drain was not placed. Yueh catheter was removed. Small amount of bloody material was removed from the paracentesis catheter prior to removal. Bandage placed over the puncture sites. FINDINGS: New perihepatic ascites. Approximately 220 mL of predominantly yellow fluid was removed from the perihepatic space. Small amount of blood was aspirated from the paracentesis catheter after the liver aspiration. Small irregular hypoechoic collections in the liver. Approximately 10 mL of purulent fluid was removed from these collections and findings are compatible with a poorly formed hepatic  abscess. The hepatic abscess was decompressed following aspiration. IMPRESSION: 1. Ultrasound-guided aspiration of the intrahepatic abscess/abscesses. Total of 10 mL of purulent fluid was removed. There was not a well-defined collection within the liver based on wire placement. In addition, the liver abscess appeared to be completely decompressed by the end of the procedure. Therefore, hepatic drain was not placed. In addition, there is concern about placing an intrahepatic drain in the setting of ascites. Abscess fluid was sent for culture. 2. Ultrasound-guided paracentesis. Approximately 220 mL of peritoneal fluid was removed. Fluid was sent for analysis. Electronically Signed   By: Richarda Overlie M.D.   On: 10/10/2019 15:12   Ir Paracentesis  Result Date: 10/10/2019 INDICATION: 73 year old with bacteremia and concern for cholangitis and hepatic abscess. Cystic duct was patent on recent HIDA examination. EXAM: ULTRASOUND-GUIDED PARACENTESIS ULTRASOUND-GUIDED HEPATIC ABSCESS ASPIRATION MEDICATIONS: The patient is currently admitted to the hospital and receiving intravenous antibiotics. ANESTHESIA/SEDATION: Fentanyl 12.5 mcg IV; Versed 0.5 mg IV Moderate Sedation Time:  12 minutes The patient was continuously monitored during the procedure by the interventional radiology nurse under my direct supervision. COMPLICATIONS: None immediate. PROCEDURE: Informed written consent was obtained from the patient's son after a thorough discussion of the procedural risks, benefits and alternatives. All questions were addressed. Maximal Sterile Barrier Technique was utilized including caps, mask, sterile gowns, sterile gloves, sterile drape, hand hygiene and skin antiseptic. A timeout  was performed prior to the initiation of the procedure. Patient was placed supine on the interventional table. Ultrasound was used to evaluate the abdomen. New perihepatic ascites was identified. Skin was anesthetized with 1% lidocaine. Using  ultrasound guidance, a 19 gauge Yueh catheter was directed into the perihepatic space and a paracentesis was performed. The paracentesis catheter was kept in place as attention was directed to the intrahepatic lesion. Ultrasound demonstrated irregular hypoechoic areas in liver concerning for intrahepatic abscess. After the perihepatic ascites had been removed, a new 19 gauge Yueh catheter was directed into the liver and into the hypoechoic hepatic area with ultrasound guidance. Tan colored purulent fluid was aspirated. Approximately 10 mL of purulent fluid was removed from the liver. Following the aspiration, there was no significant collection remaining. Stiff Amplatz wire was advanced into the liver but with the wire was not forming within a well-defined collection. Drain was not placed. Yueh catheter was removed. Small amount of bloody material was removed from the paracentesis catheter prior to removal. Bandage placed over the puncture sites. FINDINGS: New perihepatic ascites. Approximately 220 mL of predominantly yellow fluid was removed from the perihepatic space. Small amount of blood was aspirated from the paracentesis catheter after the liver aspiration. Small irregular hypoechoic collections in the liver. Approximately 10 mL of purulent fluid was removed from these collections and findings are compatible with a poorly formed hepatic abscess. The hepatic abscess was decompressed following aspiration. IMPRESSION: 1. Ultrasound-guided aspiration of the intrahepatic abscess/abscesses. Total of 10 mL of purulent fluid was removed. There was not a well-defined collection within the liver based on wire placement. In addition, the liver abscess appeared to be completely decompressed by the end of the procedure. Therefore, hepatic drain was not placed. In addition, there is concern about placing an intrahepatic drain in the setting of ascites. Abscess fluid was sent for culture. 2. Ultrasound-guided paracentesis.  Approximately 220 mL of peritoneal fluid was removed. Fluid was sent for analysis. Electronically Signed   By: Richarda OverlieAdam  Henn M.D.   On: 10/10/2019 15:12    Anti-infectives: Anti-infectives (From admission, onward)   Start     Dose/Rate Route Frequency Ordered Stop   10/08/19 1000  meropenem (MERREM) 1 g in sodium chloride 0.9 % 100 mL IVPB     1 g 200 mL/hr over 30 Minutes Intravenous Every 12 hours 10/08/19 0848     10/07/19 1700  vancomycin (VANCOCIN) IVPB 750 mg/150 ml premix  Status:  Discontinued     750 mg 150 mL/hr over 60 Minutes Intravenous Every 24 hours 10/06/19 1608 10/07/19 0427   10/07/19 1700  vancomycin (VANCOCIN) IVPB 750 mg/150 ml premix  Status:  Discontinued     750 mg 150 mL/hr over 60 Minutes Intravenous Every 24 hours 10/07/19 1247 10/08/19 0836   10/07/19 1245  ampicillin (OMNIPEN) 2 g in sodium chloride 0.9 % 100 mL IVPB  Status:  Discontinued     2 g 300 mL/hr over 20 Minutes Intravenous Every 6 hours 10/07/19 1238 10/08/19 0836   10/07/19 0200  cefTRIAXone (ROCEPHIN) 2 g in sodium chloride 0.9 % 100 mL IVPB  Status:  Discontinued     2 g 200 mL/hr over 30 Minutes Intravenous Every 12 hours 10/06/19 1610 10/08/19 0836   10/06/19 1615  vancomycin (VANCOCIN) 1,250 mg in sodium chloride 0.9 % 250 mL IVPB     1,250 mg 166.7 mL/hr over 90 Minutes Intravenous  Once 10/06/19 1608 10/06/19 2111   10/06/19 1615  ampicillin (OMNIPEN) 2  g in sodium chloride 0.9 % 100 mL IVPB  Status:  Discontinued     2 g 300 mL/hr over 20 Minutes Intravenous Every 4 hours 10/06/19 1613 10/07/19 0427   10/06/19 1600  acyclovir (ZOVIRAX) 550 mg in dextrose 5 % 100 mL IVPB     550 mg 111 mL/hr over 60 Minutes Intravenous  Once 10/06/19 1558 10/06/19 2216   10/06/19 1230  cefTRIAXone (ROCEPHIN) 2 g in sodium chloride 0.9 % 100 mL IVPB     2 g 200 mL/hr over 30 Minutes Intravenous  Once 10/06/19 1215 10/06/19 1431       Assessment/Plan DM 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 -IR aspirated this abscess yesterday.  cx reveals gram - rods -defer to ID and medicine for abx therapy -HIDA negative so no sign of acute cholecystitis as source of abscess or need for lap chole -will defer further treatment to other services -no surgical intervention planned.  We will sign off at this time.   FEN -CLD VTE -SCDs ID -Merrem   LOS: 5 days    Henreitta Cea , [facility] Surgery 10/11/2019, 8:55 AM Please see Amion for pager number during day hours 7:00am-4:30pm

## 2019-10-11 NOTE — H&P (View-Only) (Signed)
[facility] for Infectious Disease    Date of Admission:  10/06/2019   Total days of antibiotics 6/day 4 meropenem                  ID: Courtney [name] is a 73 y.o. female with klebsiella bacteremia with hepatic abscess s/p drainage Active Problems:   Altered mental status   AMS (altered mental status)    Subjective: Afebrile, more alert feels better than on admit though still complains of lower abdominal pain.   Medications:  . chlorhexidine  15 mL Mouth Rinse BID  . Chlorhexidine Gluconate Cloth  6 each Topical Daily  . flecainide  50 mg Oral BID  . insulin aspart  0-20 Units Subcutaneous Q4H  . insulin glargine  10 Units Subcutaneous Daily  . mouth rinse  15 mL Mouth Rinse q12n4p  . pantoprazole (PROTONIX) IV  40 mg Intravenous Q24H    Objective: Vital signs in last 24 hours: Temp:  [97.4 F (36.3 C)-99.6 F (37.6 C)] 99.6 F (37.6 C) (10/29 1600) Pulse Rate:  [29-83] 46 (10/29 1530) Resp:  [15-29] 24 (10/29 1530) BP: (91-187)/(49-97) 100/51 (10/29 1530) SpO2:  [93 %-98 %] 94 % (10/29 1530) Physical Exam  Constitutional:  oriented to person, place, and time. appears well-developed and well-nourished. No distress.  HENT: [place]/AT, PERRLA, no scleral icterus Mouth/Throat: Oropharynx is clear and moist. No oropharyngeal exudate.  Cardiovascular: Normal rate, regular rhythm and normal heart sounds. Exam reveals no gallop and no friction rub.  No murmur heard.  Pulmonary/Chest: Effort normal and breath sounds normal. No respiratory distress.  has no wheezes.  Neck = supple, no nuchal rigidity Abdominal: Soft. Bowel sounds are normal.  mild distension. mild tenderness.  Lymphadenopathy: no cervical adenopathy. No axillary adenopathy Neurological: alert and oriented to person, place, and time.  Skin: Skin is warm and dry. No rash noted. No erythema.  Psychiatric: a normal mood and affect.  behavior is normal.    Lab Results Recent Labs    10/10/19 0419 10/11/19 0233   WBC 20.6* 20.8*  HGB 13.0 11.9*  HCT 36.2 34.1*  NA 137 138  K 4.5 3.2*  CL 112* 112*  CO2 15* 18*  BUN 30* 23  CREATININE 0.72 0.82   Liver Panel Recent Labs    10/10/19 0419 10/11/19 0233  PROT 4.6* 4.4*  ALBUMIN 1.5* 1.5*  AST 293* 273*  ALT 481* 370*  ALKPHOS 114 122  BILITOT 1.2 1.0  BILIDIR 0.3* 0.2  IBILI 0.9 0.8    Microbiology: 10/28 hepatic fluid -GNR pending 10/28 ascites fluid- NGTD 10/24 blood cx kleb pneumoniae  Studies/Results: Nm Hepatobiliary Liver Func  Result Date: 10/09/2019 CLINICAL DATA:  Upper abdominal pain and abnormal appearing gallbladder on recent ultrasound examination EXAM: NUCLEAR MEDICINE HEPATOBILIARY IMAGING TECHNIQUE: Sequential images of the abdomen were obtained out to 80 minutes following intravenous administration of radiopharmaceutical. RADIOPHARMACEUTICALS:  5.3 mCi Tc-23m  Choletec IV COMPARISON:  Ultrasound right upper quadrant October 08, 2019 FINDINGS: Liver uptake of radiotracer is unremarkable. There is visualization of small bowel indicating patency of the common bile duct. Gallbladder visualizes, although somewhat delayed compared to visualization of the small bowel. IMPRESSION: There is visualization of the gallbladder which does indicate patency of the cystic duct. Relative delay in visualization of the gallbladder compared to small bowel may be a finding indicative of chronic cholecystitis. Common bile duct is patent as is evidenced by visualization of the small bowel. Comment: Note that radiotracer is  seen in the right upper extremity due to apparent infiltration of intravenous access site earlier in the course of the study. Electronically Signed   By: William  Woodruff III M.D.   On: 10/09/2019 16:58   Ir Us Guide Bx Asp/drain  Result Date: 10/10/2019 INDICATION: 73-year-old with bacteremia and concern for cholangitis and hepatic abscess. Cystic duct was patent on recent HIDA examination. EXAM: ULTRASOUND-GUIDED  PARACENTESIS ULTRASOUND-GUIDED HEPATIC ABSCESS ASPIRATION MEDICATIONS: The patient is currently admitted to the hospital and receiving intravenous antibiotics. ANESTHESIA/SEDATION: Fentanyl 12.5 mcg IV; Versed 0.5 mg IV Moderate Sedation Time:  12 minutes The patient was continuously monitored during the procedure by the interventional radiology nurse under my direct supervision. COMPLICATIONS: None immediate. PROCEDURE: Informed written consent was obtained from the patient's son after a thorough discussion of the procedural risks, benefits and alternatives. All questions were addressed. Maximal Sterile Barrier Technique was utilized including caps, mask, sterile gowns, sterile gloves, sterile drape, hand hygiene and skin antiseptic. A timeout was performed prior to the initiation of the procedure. Patient was placed supine on the interventional table. Ultrasound was used to evaluate the abdomen. New perihepatic ascites was identified. Skin was anesthetized with 1% lidocaine. Using ultrasound guidance, a 19 gauge Yueh catheter was directed into the perihepatic space and a paracentesis was performed. The paracentesis catheter was kept in place as attention was directed to the intrahepatic lesion. Ultrasound demonstrated irregular hypoechoic areas in liver concerning for intrahepatic abscess. After the perihepatic ascites had been removed, a new 19 gauge Yueh catheter was directed into the liver and into the hypoechoic hepatic area with ultrasound guidance. Tan colored purulent fluid was aspirated. Approximately 10 mL of purulent fluid was removed from the liver. Following the aspiration, there was no significant collection remaining. Stiff Amplatz wire was advanced into the liver but with the wire was not forming within a well-defined collection. Drain was not placed. Yueh catheter was removed. Small amount of bloody material was removed from the paracentesis catheter prior to removal. Bandage placed over the  puncture sites. FINDINGS: New perihepatic ascites. Approximately 220 mL of predominantly yellow fluid was removed from the perihepatic space. Small amount of blood was aspirated from the paracentesis catheter after the liver aspiration. Small irregular hypoechoic collections in the liver. Approximately 10 mL of purulent fluid was removed from these collections and findings are compatible with a poorly formed hepatic abscess. The hepatic abscess was decompressed following aspiration. IMPRESSION: 1. Ultrasound-guided aspiration of the intrahepatic abscess/abscesses. Total of 10 mL of purulent fluid was removed. There was not a well-defined collection within the liver based on wire placement. In addition, the liver abscess appeared to be completely decompressed by the end of the procedure. Therefore, hepatic drain was not placed. In addition, there is concern about placing an intrahepatic drain in the setting of ascites. Abscess fluid was sent for culture. 2. Ultrasound-guided paracentesis. Approximately 220 mL of peritoneal fluid was removed. Fluid was sent for analysis. Electronically Signed   By: Adam  Henn M.D.   On: 10/10/2019 15:12   Ir Paracentesis  Result Date: 10/10/2019 INDICATION: 73-year-old with bacteremia and concern for cholangitis and hepatic abscess. Cystic duct was patent on recent HIDA examination. EXAM: ULTRASOUND-GUIDED PARACENTESIS ULTRASOUND-GUIDED HEPATIC ABSCESS ASPIRATION MEDICATIONS: The patient is currently admitted to the hospital and receiving intravenous antibiotics. ANESTHESIA/SEDATION: Fentanyl 12.5 mcg IV; Versed 0.5 mg IV Moderate Sedation Time:  12 minutes The patient was continuously monitored during the procedure by the interventional radiology nurse under my direct supervision. COMPLICATIONS: None   immediate. PROCEDURE: Informed written consent was obtained from the patient's son after a thorough discussion of the procedural risks, benefits and alternatives. All questions were  addressed. Maximal Sterile Barrier Technique was utilized including caps, mask, sterile gowns, sterile gloves, sterile drape, hand hygiene and skin antiseptic. A timeout was performed prior to the initiation of the procedure. Patient was placed supine on the interventional table. Ultrasound was used to evaluate the abdomen. New perihepatic ascites was identified. Skin was anesthetized with 1% lidocaine. Using ultrasound guidance, a 19 gauge Yueh catheter was directed into the perihepatic space and a paracentesis was performed. The paracentesis catheter was kept in place as attention was directed to the intrahepatic lesion. Ultrasound demonstrated irregular hypoechoic areas in liver concerning for intrahepatic abscess. After the perihepatic ascites had been removed, a new 19 gauge Yueh catheter was directed into the liver and into the hypoechoic hepatic area with ultrasound guidance. Tan colored purulent fluid was aspirated. Approximately 10 mL of purulent fluid was removed from the liver. Following the aspiration, there was no significant collection remaining. Stiff Amplatz wire was advanced into the liver but with the wire was not forming within a well-defined collection. Drain was not placed. Yueh catheter was removed. Small amount of bloody material was removed from the paracentesis catheter prior to removal. Bandage placed over the puncture sites. FINDINGS: New perihepatic ascites. Approximately 220 mL of predominantly yellow fluid was removed from the perihepatic space. Small amount of blood was aspirated from the paracentesis catheter after the liver aspiration. Small irregular hypoechoic collections in the liver. Approximately 10 mL of purulent fluid was removed from these collections and findings are compatible with a poorly formed hepatic abscess. The hepatic abscess was decompressed following aspiration. IMPRESSION: 1. Ultrasound-guided aspiration of the intrahepatic abscess/abscesses. Total of 10 mL of  purulent fluid was removed. There was not a well-defined collection within the liver based on wire placement. In addition, the liver abscess appeared to be completely decompressed by the end of the procedure. Therefore, hepatic drain was not placed. In addition, there is concern about placing an intrahepatic drain in the setting of ascites. Abscess fluid was sent for culture. 2. Ultrasound-guided paracentesis. Approximately 220 mL of peritoneal fluid was removed. Fluid was sent for analysis. Electronically Signed   By: Richarda Overlie M.D.   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 Will narrow to cetriaxone 2gm iv daily plus repeat peripheral blood cx, and follow up on cx Given hx of pacemaker, will get TEE. Has been scheduled for tomorrow.   Liver abscess= continue on ceftriaxone. transaminitis appear improving  [facility] for Infectious Diseases Cell: [phone] Pager: [phone]  10/11/2019, 4:27 PM

## 2019-10-11 NOTE — Progress Notes (Signed)
[facility] ADULT ICU REPLACEMENT PROTOCOL FOR AM LAB REPLACEMENT ONLY  The patient does apply for the [facility] Adult ICU Electrolyte Replacment Protocol based on the criteria listed below:   1. Is GFR >/= 40 ml/min? Yes.    Patient's GFR today is >60 2. Is urine output >/= 0.5 ml/kg/hr for the last 6 hours? Yes.   Patient's UOP is 2 ml/kg/hr 3. Is BUN < 60 mg/dL? Yes.    Patient's BUN today is 23 4. Abnormal electrolyte(s): K-3.2 5. Ordered repletion with: per protocol 6. If a panic level lab has been reported, has the CCM MD in charge been notified? Yes.  .   Physician:  Dr. Welton Flakes, Philis Nettle 10/11/2019 6:34 AM

## 2019-10-11 NOTE — Progress Notes (Signed)
Results for Gullion, Maleigha H (MRN [id number]) as of 10/11/2019 08:58  Ref. Range 10/10/2019 15:21 10/10/2019 19:44 10/10/2019 23:13 10/11/2019 03:16 10/11/2019 08:08  Glucose-Capillary Latest Ref Range: 70 - 99 mg/dL 88 167 (H) 83 68 (L) 102 (H)  Noted that patient's last dose of Lantus 10 units was on 10/09/19 at 0959.  Recommend discontinuing Lantus and continue Novolog RESISTANT correction scale every 4 hours as ordered if blood sugars continue to be less than 180 mg/dl.  Harvel Ricks RN BSN CDE Diabetes Coordinator Pager: [phone]  8am-5pm

## 2019-10-11 NOTE — Progress Notes (Signed)
Progress Note    ASSESSMENT AND PLAN:    73 yo female with  DM, AFIB admitted with altered mental status. Has Klebsiella sepsis . Initial CT scan raised concern for cholecystitis / liver abscess / cholangitis. She is  s/p paracentesis and hepatic abscess aspiration by IR yesterday.No drain placed.  Gram stain > gram negative rods, culture pending. Surgery following. HIDA >> visualization of gb, patent cystic duct and CBD. Liver enzymes continue to improve. WBC still high at 20K -No evidence for acute cholecystitis on HIDA, no surgery planned.  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 May need EUS at some point but in the interim considering CT AP for re-evaluation of things following abscess drainage. -ID following, on Meropenem. Recommends TEE to evaluate pacer wires for vegetation.     SUBJECTIVE    Uneventful night per nurse. Tolerated liquids and diet now advanced to solids. Hasn't had any significant abdominal pain.    OBJECTIVE:     Vital signs in last 24 hours: Temp:  [97.4 F (36.3 C)-97.8 F (36.6 C)] 97.4 F (36.3 C) (10/29 0344) Pulse Rate:  [29-83] 70 (10/29 0800) Resp:  [12-25] 25 (10/29 0800) BP: (84-112)/(49-96) 104/76 (10/29 0800) SpO2:  [94 %-99 %] 96 % (10/29 0800) Last BM Date: 10/11/19 General:   Alert, well-developed female in NAD EENT:  Normal hearing, non icteric sclera, conjunctive pink.  Heart:  Regular rate and rhythm;  No lower extremity edema   Pulm: Normal respiratory effort Abdomen:  Soft, mildly distended, mild diffuse RUQ tenderness.  Normal bowel sounds.          Neurologic:  Alert and  oriented x4;  grossly normal neurologically. Psych:  Pleasant, cooperative.  Normal mood and affect.   Intake/Output from previous day: 10/28 0701 - 10/29 0700 In: 2914.3 [P.O.:120; I.V.:2594.3; IV Piggyback:200] Out: 975 [Urine:975] Intake/Output this shift: Total I/O In: 120 [P.O.:120] Out: -   Lab Results: Recent Labs    10/09/19 0459  10/10/19 0419 10/11/19 0233  WBC 12.9* 20.6* 20.8*  HGB 10.5* 13.0 11.9*  HCT 29.7* 36.2 34.1*  PLT 33* 60* 98*   BMET Recent Labs    10/09/19 0459 10/10/19 0419 10/11/19 0233  NA 137 137 138  K 4.0 4.5 3.2*  CL 115* 112* 112*  CO2 15* 15* 18*  GLUCOSE 129* 118* 80  BUN 25* 30* 23  CREATININE 0.68 0.72 0.82  CALCIUM 7.8* 7.8* 7.4*   LFT Recent Labs    10/11/19 0233  PROT 4.4*  ALBUMIN 1.5*  AST 273*  ALT 370*  ALKPHOS 122  BILITOT 1.0  BILIDIR 0.2  IBILI 0.8   PT/INR Recent Labs    10/08/19 1215 10/09/19 1102  LABPROT 16.0* 16.4*  INR 1.3* 1.3*   Hepatitis Panel Recent Labs    10/08/19 1215  HEPBSAG NON REACTIVE  HCVAB NON REACTIVE  HEPAIGM NON REACTIVE  HEPBIGM NON REACTIVE    Nm Hepatobiliary Liver Func  Result Date: 10/09/2019 CLINICAL DATA:  Upper abdominal pain and abnormal appearing gallbladder on recent ultrasound examination EXAM: NUCLEAR MEDICINE HEPATOBILIARY IMAGING TECHNIQUE: Sequential images of the abdomen were obtained out to 80 minutes following intravenous administration of radiopharmaceutical. RADIOPHARMACEUTICALS:  5.3 mCi Tc-1150m  Choletec IV COMPARISON:  Ultrasound right upper quadrant October 08, 2019 FINDINGS: Liver uptake of radiotracer is unremarkable. There is visualization of small bowel indicating patency of the common bile duct. Gallbladder visualizes, although somewhat delayed compared to visualization of the small bowel. IMPRESSION: There is visualization  of the gallbladder which does indicate patency of the cystic duct. Relative delay in visualization of the gallbladder compared to small bowel may be a finding indicative of chronic cholecystitis. Common bile duct is patent as is evidenced by visualization of the small bowel. Comment: Note that radiotracer is seen in the right upper extremity due to apparent infiltration of intravenous access site earlier in the course of the study. Electronically Signed   By: Lowella Grip  III M.D.   On: 10/09/2019 16:58   Ir US Guide Bx Asp/drain  Result Date: 10/10/2019 INDICATION: 73 year old with bacteremia and concern for cholangitis and hepatic abscess. Cystic duct was patent on recent HIDA examination. EXAM: ULTRASOUND-GUIDED PARACENTESIS ULTRASOUND-GUIDED HEPATIC ABSCESS ASPIRATION MEDICATIONS: The patient is currently admitted to the hospital and receiving intravenous antibiotics. ANESTHESIA/SEDATION: Fentanyl 12.5 mcg IV; Versed 0.5 mg IV Moderate Sedation Time:  12 minutes The patient was continuously monitored during the procedure by the interventional radiology nurse under my direct supervision. COMPLICATIONS: None immediate. PROCEDURE: Informed written consent was obtained from the patient's son after a thorough discussion of the procedural risks, benefits and alternatives. All questions were addressed. Maximal Sterile Barrier Technique was utilized including caps, mask, sterile gowns, sterile gloves, sterile drape, hand hygiene and skin antiseptic. A timeout was performed prior to the initiation of the procedure. Patient was placed supine on the interventional table. Ultrasound was used to evaluate the abdomen. New perihepatic ascites was identified. Skin was anesthetized with 1% lidocaine. Using ultrasound guidance, a 19 gauge Yueh catheter was directed into the perihepatic space and a paracentesis was performed. The paracentesis catheter was kept in place as attention was directed to the intrahepatic lesion. Ultrasound demonstrated irregular hypoechoic areas in liver concerning for intrahepatic abscess. After the perihepatic ascites had been removed, a new 19 gauge Yueh catheter was directed into the liver and into the hypoechoic hepatic area with ultrasound guidance. Tan colored purulent fluid was aspirated. Approximately 10 mL of purulent fluid was removed from the liver. Following the aspiration, there was no significant collection remaining. Stiff Amplatz wire was advanced  into the liver but with the wire was not forming within a well-defined collection. Drain was not placed. Yueh catheter was removed. Small amount of bloody material was removed from the paracentesis catheter prior to removal. Bandage placed over the puncture sites. FINDINGS: New perihepatic ascites. Approximately 220 mL of predominantly yellow fluid was removed from the perihepatic space. Small amount of blood was aspirated from the paracentesis catheter after the liver aspiration. Small irregular hypoechoic collections in the liver. Approximately 10 mL of purulent fluid was removed from these collections and findings are compatible with a poorly formed hepatic abscess. The hepatic abscess was decompressed following aspiration. IMPRESSION: 1. Ultrasound-guided aspiration of the intrahepatic abscess/abscesses. Total of 10 mL of purulent fluid was removed. There was not a well-defined collection within the liver based on wire placement. In addition, the liver abscess appeared to be completely decompressed by the end of the procedure. Therefore, hepatic drain was not placed. In addition, there is concern about placing an intrahepatic drain in the setting of ascites. Abscess fluid was sent for culture. 2. Ultrasound-guided paracentesis. Approximately 220 mL of peritoneal fluid was removed. Fluid was sent for analysis. Electronically Signed   By: Markus Daft M.D.   On: 10/10/2019 15:12   Ir Paracentesis  Result Date: 10/10/2019 INDICATION: 73 year old with bacteremia and concern for cholangitis and hepatic abscess. Cystic duct was patent on recent HIDA examination. EXAM: ULTRASOUND-GUIDED PARACENTESIS ULTRASOUND-GUIDED HEPATIC  ABSCESS ASPIRATION MEDICATIONS: The patient is currently admitted to the hospital and receiving intravenous antibiotics. ANESTHESIA/SEDATION: Fentanyl 12.5 mcg IV; Versed 0.5 mg IV Moderate Sedation Time:  12 minutes The patient was continuously monitored during the procedure by the  interventional radiology nurse under my direct supervision. COMPLICATIONS: None immediate. PROCEDURE: Informed written consent was obtained from the patient's son after a thorough discussion of the procedural risks, benefits and alternatives. All questions were addressed. Maximal Sterile Barrier Technique was utilized including caps, mask, sterile gowns, sterile gloves, sterile drape, hand hygiene and skin antiseptic. A timeout was performed prior to the initiation of the procedure. Patient was placed supine on the interventional table. Ultrasound was used to evaluate the abdomen. New perihepatic ascites was identified. Skin was anesthetized with 1% lidocaine. Using ultrasound guidance, a 19 gauge Yueh catheter was directed into the perihepatic space and a paracentesis was performed. The paracentesis catheter was kept in place as attention was directed to the intrahepatic lesion. Ultrasound demonstrated irregular hypoechoic areas in liver concerning for intrahepatic abscess. After the perihepatic ascites had been removed, a new 19 gauge Yueh catheter was directed into the liver and into the hypoechoic hepatic area with ultrasound guidance. Tan colored purulent fluid was aspirated. Approximately 10 mL of purulent fluid was removed from the liver. Following the aspiration, there was no significant collection remaining. Stiff Amplatz wire was advanced into the liver but with the wire was not forming within a well-defined collection. Drain was not placed. Yueh catheter was removed. Small amount of bloody material was removed from the paracentesis catheter prior to removal. Bandage placed over the puncture sites. FINDINGS: New perihepatic ascites. Approximately 220 mL of predominantly yellow fluid was removed from the perihepatic space. Small amount of blood was aspirated from the paracentesis catheter after the liver aspiration. Small irregular hypoechoic collections in the liver. Approximately 10 mL of purulent fluid  was removed from these collections and findings are compatible with a poorly formed hepatic abscess. The hepatic abscess was decompressed following aspiration. IMPRESSION: 1. Ultrasound-guided aspiration of the intrahepatic abscess/abscesses. Total of 10 mL of purulent fluid was removed. There was not a well-defined collection within the liver based on wire placement. In addition, the liver abscess appeared to be completely decompressed by the end of the procedure. Therefore, hepatic drain was not placed. In addition, there is concern about placing an intrahepatic drain in the setting of ascites. Abscess fluid was sent for culture. 2. Ultrasound-guided paracentesis. Approximately 220 mL of peritoneal fluid was removed. Fluid was sent for analysis. Electronically Signed   By: Richarda Overlie M.D.   On: 10/10/2019 15:12     Active Problems:   Altered mental status   AMS (altered mental status)     LOS: 5 days   Willette Cluster ,NP 10/11/2019, 10:21 AM

## 2019-10-12 ENCOUNTER — Inpatient Hospital Stay: Payer: Self-pay

## 2019-10-12 ENCOUNTER — Other Ambulatory Visit (HOSPITAL_COMMUNITY): Payer: Medicare HMO

## 2019-10-12 ENCOUNTER — Encounter (HOSPITAL_COMMUNITY)
Admission: EM | Disposition: A | Discharge status: Home Health | Payer: Self-pay | Source: Home / Self Care | Attending: Internal Medicine

## 2019-10-12 ENCOUNTER — Inpatient Hospital Stay (HOSPITAL_COMMUNITY): Payer: Medicare HMO

## 2019-10-12 ENCOUNTER — Ambulatory Visit (HOSPITAL_COMMUNITY): Admission: RE | Admit: 2019-10-12 | Payer: Medicare HMO | Source: Home / Self Care | Admitting: Cardiology

## 2019-10-12 DIAGNOSIS — K750 Abscess of liver: Secondary | ICD-10-CM | POA: Diagnosis not present

## 2019-10-12 DIAGNOSIS — R7881 Bacteremia: Secondary | ICD-10-CM | POA: Diagnosis not present

## 2019-10-12 DIAGNOSIS — I340 Nonrheumatic mitral (valve) insufficiency: Secondary | ICD-10-CM | POA: Diagnosis not present

## 2019-10-12 DIAGNOSIS — I330 Acute and subacute infective endocarditis: Secondary | ICD-10-CM

## 2019-10-12 DIAGNOSIS — R4182 Altered mental status, unspecified: Secondary | ICD-10-CM

## 2019-10-12 DIAGNOSIS — B961 Klebsiella pneumoniae [K. pneumoniae] as the cause of diseases classified elsewhere: Secondary | ICD-10-CM

## 2019-10-12 HISTORY — PX: TEE WITHOUT CARDIOVERSION: SHX5443

## 2019-10-12 LAB — GLUCOSE, CAPILLARY
Glucose-Capillary: 101 mg/dL — ABNORMAL HIGH (ref 70–99)
Glucose-Capillary: 112 mg/dL — ABNORMAL HIGH (ref 70–99)
Glucose-Capillary: 116 mg/dL — ABNORMAL HIGH (ref 70–99)
Glucose-Capillary: 139 mg/dL — ABNORMAL HIGH (ref 70–99)
Glucose-Capillary: 193 mg/dL — ABNORMAL HIGH (ref 70–99)
Glucose-Capillary: 61 mg/dL — ABNORMAL LOW (ref 70–99)
Glucose-Capillary: 84 mg/dL (ref 70–99)

## 2019-10-12 LAB — CBC
HCT: 31.3 % — ABNORMAL LOW (ref 36.0–46.0)
Hemoglobin: 10.7 g/dL — ABNORMAL LOW (ref 12.0–15.0)
MCH: 30.8 pg (ref 26.0–34.0)
MCHC: 34.2 g/dL (ref 30.0–36.0)
MCV: 90.2 fL (ref 80.0–100.0)
Platelets: 139 10*3/uL — ABNORMAL LOW (ref 150–400)
RBC: 3.47 MIL/uL — ABNORMAL LOW (ref 3.87–5.11)
RDW: 13.2 % (ref 11.5–15.5)
WBC: 22.2 10*3/uL — ABNORMAL HIGH (ref 4.0–10.5)
nRBC: 0 % (ref 0.0–0.2)

## 2019-10-12 LAB — HEPATIC FUNCTION PANEL
ALT: 334 U/L — ABNORMAL HIGH (ref 0–44)
AST: 267 U/L — ABNORMAL HIGH (ref 15–41)
Albumin: 1.6 g/dL — ABNORMAL LOW (ref 3.5–5.0)
Alkaline Phosphatase: 123 U/L (ref 38–126)
Bilirubin, Direct: 0.2 mg/dL (ref 0.0–0.2)
Indirect Bilirubin: 0.7 mg/dL (ref 0.3–0.9)
Total Bilirubin: 0.9 mg/dL (ref 0.3–1.2)
Total Protein: 4.3 g/dL — ABNORMAL LOW (ref 6.5–8.1)

## 2019-10-12 LAB — BASIC METABOLIC PANEL
Anion gap: 8 (ref 5–15)
BUN: 9 mg/dL (ref 8–23)
CO2: 21 mmol/L — ABNORMAL LOW (ref 22–32)
Calcium: 7.5 mg/dL — ABNORMAL LOW (ref 8.9–10.3)
Chloride: 111 mmol/L (ref 98–111)
Creatinine, Ser: 0.61 mg/dL (ref 0.44–1.00)
GFR calc Af Amer: >60 mL/min (ref >60)
GFR calc non Af Amer: >60 mL/min (ref >60)
Glucose, Bld: 128 mg/dL — ABNORMAL HIGH (ref 70–99)
Potassium: 3.7 mmol/L (ref 3.5–5.1)
Sodium: 140 mmol/L (ref 135–145)

## 2019-10-12 SURGERY — ECHOCARDIOGRAM, TRANSESOPHAGEAL
Anesthesia: Moderate Sedation

## 2019-10-12 MED ORDER — SODIUM CHLORIDE 0.9 % IV SOLN
INTRAVENOUS | Status: AC | PRN
  Administered 2019-10-12: 500 mL via INTRAVENOUS

## 2019-10-12 MED ORDER — FENTANYL CITRATE (PF) 100 MCG/2ML IJ SOLN
INTRAMUSCULAR | Status: AC
  Filled 2019-10-12: qty 2

## 2019-10-12 MED ORDER — INSULIN ASPART 100 UNIT/ML ~~LOC~~ SOLN
0.0000 [IU] | Freq: Three times a day (TID) | SUBCUTANEOUS | Status: DC
  Administered 2019-10-13: 7 [IU] via SUBCUTANEOUS
  Administered 2019-10-13: 3 [IU] via SUBCUTANEOUS
  Administered 2019-10-13 – 2019-10-14 (×2): 2 [IU] via SUBCUTANEOUS

## 2019-10-12 MED ORDER — SODIUM CHLORIDE 0.9% FLUSH
10.0000 mL | INTRAVENOUS | Status: DC | PRN
  Administered 2019-10-18: 10 mL
  Filled 2019-10-12: qty 40

## 2019-10-12 MED ORDER — MIDAZOLAM HCL (PF) 10 MG/2ML IJ SOLN
INTRAMUSCULAR | Status: DC | PRN
  Administered 2019-10-12 (×2): 1 mg via INTRAVENOUS

## 2019-10-12 MED ORDER — SODIUM CHLORIDE 0.9% FLUSH
10.0000 mL | Freq: Two times a day (BID) | INTRAVENOUS | Status: DC
  Administered 2019-10-13 – 2019-10-18 (×11): 10 mL

## 2019-10-12 MED ORDER — FENTANYL CITRATE (PF) 100 MCG/2ML IJ SOLN
INTRAMUSCULAR | Status: DC | PRN
  Administered 2019-10-12: 25 ug via INTRAVENOUS

## 2019-10-12 MED ORDER — MIDAZOLAM HCL (PF) 5 MG/ML IJ SOLN
INTRAMUSCULAR | Status: AC
  Filled 2019-10-12: qty 2

## 2019-10-12 MED ORDER — BUTAMBEN-TETRACAINE-BENZOCAINE 2-2-14 % EX AERO
INHALATION_SPRAY | CUTANEOUS | Status: DC | PRN
  Administered 2019-10-12: 2 via TOPICAL

## 2019-10-12 NOTE — Progress Notes (Signed)
Unable to start IV. Multiple IV attempts by endoscopy staff and anesthesia staff. Dr. Marlou Porch notified. TEE has been cancelled for today per Dr. Marlou Porch. Will be rescheduled.

## 2019-10-12 NOTE — Progress Notes (Signed)
PROGRESS NOTE    Courtney [name]  ZOX:[id number] DOB: 04/28/1946 DOA: 10/06/2019 PCP: Patient, No Pcp Per    Brief Narrative:  Courtney [name] a Vietnamese speaking 73 year old female with past medical history significant for type 2 diabetes, atrial fibrillation, tachybradycardia syndrome who was brought into the emergency department with chief complaint of altered mental status. In the emergency department, there was concern for meningitis and patient was started on vancomycin, ampicillin, ceftriaxone. LP not pursued in the emergency department as patient was on Eliquis and was unclear when her last dose was. She was also found in DKA and was started on IV insulin. During her hospitalization, she was found to have Klebsiella bacteremia, ESBL UTI, multifocal pneumonia. Her mentation continued to improve and as she had source for her sepsis and altered mental status, LP was canceled. On 10/26, rapid response was called due to acute respiratory distress. Patient was transferred to the ICU with PCCM consulted. Due to concern for cholangitis, GI, IR, general surgery consulted as well.  No evidence of acute cholecystitis on HIDA scan.  No surgery planned.  MRI was not possible due to pacemaker.  She is status post paracentesis and hepatic abscess aspiration by interventional radiology. Plan for TEE today.  New events last 24 hours / Subjective: Patient denies abdominal pain, or nausea. WBC count still elevated.   Assessment & Plan:   Active Problems:   Altered mental status   AMS (altered mental status)   Acute metabolic encephalopathy -Multifactorial in setting of DKA as well as underlying sepsis - Mental status back to baseline   Severe sepsis secondary to Klebsiella bacteremia, ESBL UTI, multifocal pneumonia, concern for cholangitis, liver abscesses  -At the time of admission there was concern for meningitis due to patient's fever, altered mental status, headache. LP was not pursued at  admission due to patient being on Eliquis. Patient's last Eliquis dose was 2 days prior. LP not further pursued as patient has other source of infection to explain her presenting symptoms. Meningitis is low on the differential. -Blood culture showed Klebsiella -CT abdomen pelvis revealed diffuse gallbladder edema, pericholecystic fluid, concern for intrahepatic abscesses, concern for cholangitis  -MRCP unable to be completed due to pacemaker leads -Urine culture returned with ESBL EColi and staph epidermidis --> antibiotics changed to Merrem10/26 -Seen by GI  - Seen by General surgery,no role for surgery at this time -IR consulted for liver abscess aspiration vs drain placement  -HIDAnegative for cholecystitis -ID consulted for antibiotic assistance Patient status post paracentesis and hepatic abscess aspiration by interventional radiology on 10/10/19. 10/12/2019: -WBC count still elevated.  Continue to monitor. -Infectious disease  change antibiotic to ceftriaxone.  Recommended TEE to evaluate pacer wires for vegetation.  Appreciate ID help.  Hepatitis  -Hep A IgM NR, Ab reactive -Hep B surface Ag NR, IgM NR,Core Ab reactive -Hep C Ab NR  -Patient reports that she has had hepatitis in the past, unable to give other history regarding this  Acute hypoxemic respiratory failure -Required nonrebreather on 10/26 -PCCMconsulted, signed off 10/28 -Currently on room air  Elevated liver enzymes -Improving, trend   Paroxysmal atrial fibrillation with RVR -Patient was started on Cardizem drip at the time of admission -Currently in normal sinus rhythm with rate control  Tachybradycardia syndrome -Pacemaker placed 2018 -Continue flecainide, metoprolol  DKA -Hemoglobin A1c 16.4 -Patient required IV insulin during hospitalization and has now been transitioned to Lantus and sliding scale insulin - Continue to monitor accuchecks  Hypokalemia -Potassium placed. -Continue  to  monitor and replace as needed. -Magnesium 1.7.   DVT prophylaxis:SCD (Eliquis PTA, plan for TEE today) Code Status:Full code Family Communication:None at bedside, unable to reach Disposition Plan:Pending further work-up, clinical improvement    Consultants:  GI  PCCM  GI  IR  ID  Procedures:  Paracentesis and hepatic aspiration by interventional radiology (10/10/2019)  Objective: Vitals:   10/11/19 2033 10/12/19 0036 10/12/19 0419 10/12/19 0914  BP: 103/60 109/65 (!) 110/52 (!) 102/46  Pulse: 68 62 64 72  Resp: Temp: 98.5 F (36.9 C) 97.9 F (36.6 C) 98.8 F (37.1 C) 98.2 F (36.8 C)  TempSrc: Oral Oral Tympanic Oral  SpO2: 95% 96% 95% 96%  Weight:      Height:        Intake/Output Summary (Last 24 hours) at 10/12/2019 1052 Last data filed at 10/12/2019 0400 Gross per 24 hour  Intake 2363.32 ml  Output --  Net 2363.32 ml   Filed Weights   10/06/19 1503 10/07/19 0142 10/08/19 0641  Weight: 54.4 kg 45.2 kg 47.7 kg    Examination:  General exam: Appears calm and comfortable  Respiratory system: Clear to auscultation. Respiratory effort normal. Cardiovascular system: S1 & S2 heard, RRR. No pedal edema. Gastrointestinal system: Mild distention, nontender, dressing in right upper quadrant area. Normal bowel sounds heard. Central nervous system: Alert and oriented. No focal neurological deficits. Extremities: No edema Skin: No rashes Psychiatry: Mood & affect appropriate.     Data Reviewed: I have personally reviewed following labs and imaging studies  CBC: Recent Labs  Lab 10/06/19 1240  10/08/19 0531 10/09/19 0459 10/10/19 0419 10/11/19 0233 10/12/19 0235  WBC 14.9*   < > 10.4 12.9* 20.6* 20.8* 22.2*  NEUTROABS 12.1*  --   --   --   --   --   --   HGB 11.9*   < > 10.6* 10.5* 13.0 11.9* 10.7*  HCT 37.1   < > 29.8* 29.7* 36.2 34.1* 31.3*  MCV 97.6   < > 87.6 88.1 87.2 88.3 90.2  PLT 101*   < > 43* 33* 60* 98* 139*    < > = values in this interval not displayed.   Basic Metabolic Panel: Recent Labs  Lab 10/07/19 0429 10/08/19 0531 10/08/19 1603 10/09/19 0459 10/10/19 0419 10/11/19 0233 10/11/19 1645 10/12/19 0235  NA 137 134* 135 137 137 138 139 140  K 2.1* 3.4* 3.7 4.0 4.5 3.2* 3.7 3.7  CL 110 109 111 115* 112* 112* 110 111  CO2 15* 14* 12* 15* 15* 18* 21* 21*  GLUCOSE 95 162* 205* 129* 118* 80 129* 128*  BUN 12 24* 23 25* 30* CREATININE 0.73 0.76 0.83 0.68 0.72 0.82 0.73 0.61  CALCIUM 8.0* 7.8* 7.9* 7.8* 7.8* 7.4* 7.6* 7.5*  MG 1.6* 2.1 1.8 1.9  --   --  1.7  --    GFR: Estimated Creatinine Clearance: 47.9 mL/min (by C-G formula based on SCr of 0.61 mg/dL). Liver Function Tests: Recent Labs  Lab 10/07/19 0429 10/08/19 0531 10/09/19 0459 10/10/19 0419 10/11/19 0233  AST 56* 2,353* 995* 293* 273*  ALT 59* 885* 794* 481* 370*  ALKPHOS 99 98 109 114 122  BILITOT 0.5 0.9 1.2 1.2 1.0  PROT 5.4* 5.4* 5.0* 4.6* 4.4*  ALBUMIN 2.0* 2.0* 1.8* 1.5* 1.5*   No results for input(s): LIPASE, AMYLASE in the last 168 hours. No results for input(s): AMMONIA in the last 168 hours.  Coagulation Profile: Recent Labs  Lab 10/06/19 1240 10/08/19 1215 10/09/19 1102  INR 1.2 1.3* 1.3*   Cardiac Enzymes: Recent Labs  Lab 10/09/19 0633  CKTOTAL 41   BNP (last 3 results) No results for input(s): PROBNP in the last 8760 hours. HbA1C: No results for input(s): HGBA1C in the last 72 hours. CBG: Recent Labs  Lab 10/11/19 2113 10/12/19 0037 10/12/19 0128 10/12/19 0412 10/12/19 0923  GLUCAP 89 61* 139* 84 116*   Lipid Profile: No results for input(s): CHOL, HDL, LDLCALC, TRIG, CHOLHDL, LDLDIRECT in the last 72 hours. Thyroid Function Tests: No results for input(s): TSH, T4TOTAL, FREET4, T3FREE, THYROIDAB in the last 72 hours. Anemia Panel: No results for input(s): VITAMINB12, FOLATE, FERRITIN, TIBC, IRON, RETICCTPCT in the last 72 hours. Sepsis Labs: Recent Labs  Lab  10/07/19 1039 10/08/19 1611 10/08/19 2236 10/09/19 1102  LATICACIDVEN 1.5 4.3* 2.7* 1.7    Recent Results (from the past 240 hour(s))  Blood Culture (routine x 2)     Status: Abnormal   Collection Time: 10/06/19 12:40 PM   Specimen: BLOOD  Result Value Ref Range Status   Specimen Description BLOOD RIGHT ANTECUBITAL  Final   Special Requests   Final    BOTTLES DRAWN AEROBIC AND ANAEROBIC Blood Culture results may not be optimal due to an inadequate volume of blood received in culture bottles   Culture  Setup Time   Final    IN BOTH AEROBIC AND ANAEROBIC BOTTLES GRAM NEGATIVE RODS CRITICAL RESULT CALLED TO, READ BACK BY AND VERIFIED WITH: Evelena PeatG ABBOTT [facility] 10/07/19 0419 JDW Performed at [facility], [address] [place], [place] [address]    Culture KLEBSIELLA PNEUMONIAE (A)  Final   Report Status 10/09/2019 FINAL  Final   Organism ID, Bacteria KLEBSIELLA PNEUMONIAE  Final      Susceptibility   Klebsiella pneumoniae - MIC*    AMPICILLIN >=32 RESISTANT Resistant     CEFAZOLIN <=4 SENSITIVE Sensitive     CEFEPIME <=1 SENSITIVE Sensitive     CEFTAZIDIME <=1 SENSITIVE Sensitive     CEFTRIAXONE <=1 SENSITIVE Sensitive     CIPROFLOXACIN <=0.25 SENSITIVE Sensitive     GENTAMICIN <=1 SENSITIVE Sensitive     IMIPENEM 0.5 SENSITIVE Sensitive     TRIMETH/SULFA <=20 SENSITIVE Sensitive     AMPICILLIN/SULBACTAM 8 SENSITIVE Sensitive     PIP/TAZO <=4 SENSITIVE Sensitive     Extended ESBL NEGATIVE Sensitive     * KLEBSIELLA PNEUMONIAE  Blood Culture ID Panel (Reflexed)     Status: Abnormal   Collection Time: 10/06/19 12:40 PM  Result Value Ref Range Status   Enterococcus species NOT DETECTED NOT DETECTED Final   Listeria monocytogenes NOT DETECTED NOT DETECTED Final   Staphylococcus species NOT DETECTED NOT DETECTED Final   Staphylococcus aureus (BCID) NOT DETECTED NOT DETECTED Final   Streptococcus species NOT DETECTED NOT DETECTED Final   Streptococcus agalactiae NOT DETECTED  NOT DETECTED Final   Streptococcus pneumoniae NOT DETECTED NOT DETECTED Final   Streptococcus pyogenes NOT DETECTED NOT DETECTED Final   Acinetobacter baumannii NOT DETECTED NOT DETECTED Final   Enterobacteriaceae species DETECTED (A) NOT DETECTED Final    Comment: Enterobacteriaceae represent a large family of gram-negative bacteria, not a single organism. CRITICAL RESULT CALLED TO, READ BACK BY AND VERIFIED WITH: G ABBOTT PHARMD 10/07/19 0419 JDW    Enterobacter cloacae complex NOT DETECTED NOT DETECTED Final   Escherichia coli NOT DETECTED NOT DETECTED Final   Klebsiella oxytoca NOT DETECTED NOT  DETECTED Final   Klebsiella pneumoniae DETECTED (A) NOT DETECTED Final    Comment: CRITICAL RESULT CALLED TO, READ BACK BY AND VERIFIED WITH: G ABBOTT PHARMD 10/07/19 0419 JDW    Proteus species NOT DETECTED NOT DETECTED Final   Serratia marcescens NOT DETECTED NOT DETECTED Final   Carbapenem resistance NOT DETECTED NOT DETECTED Final   Haemophilus influenzae NOT DETECTED NOT DETECTED Final   Neisseria meningitidis NOT DETECTED NOT DETECTED Final   Pseudomonas aeruginosa NOT DETECTED NOT DETECTED Final   Candida albicans NOT DETECTED NOT DETECTED Final   Candida glabrata NOT DETECTED NOT DETECTED Final   Candida krusei NOT DETECTED NOT DETECTED Final   Candida parapsilosis NOT DETECTED NOT DETECTED Final   Candida tropicalis NOT DETECTED NOT DETECTED Final    Comment: Performed at [facility], [place] [address], [place], [place] [address]  Blood Culture (routine x 2)     Status: Abnormal   Collection Time: 10/06/19 12:45 PM   Specimen: BLOOD RIGHT HAND  Result Value Ref Range Status   Specimen Description BLOOD RIGHT HAND  Final   Special Requests   Final    BOTTLES DRAWN AEROBIC AND ANAEROBIC Blood Culture results may not be optimal due to an inadequate volume of blood received in culture bottles   Culture  Setup Time   Final    IN BOTH AEROBIC AND ANAEROBIC BOTTLES GRAM NEGATIVE  RODS CRITICAL VALUE NOTED.  VALUE IS CONSISTENT WITH PREVIOUSLY REPORTED AND CALLED VALUE.    Culture (A)  Final    KLEBSIELLA PNEUMONIAE SUSCEPTIBILITIES PERFORMED ON PREVIOUS CULTURE WITHIN THE LAST 5 DAYS. Performed at [facility], [place] [address] [place], [place] [address]    Report Status 10/09/2019 FINAL  Final  SARS CORONAVIRUS 2 (TAT 6-24 HRS) Nasopharyngeal Nasopharyngeal Swab     Status: None   Collection Time: 10/06/19  3:35 PM   Specimen: Nasopharyngeal Swab  Result Value Ref Range Status   SARS Coronavirus 2 NEGATIVE NEGATIVE Final    Comment: (NOTE) SARS-CoV-2 target nucleic acids are NOT DETECTED. The SARS-CoV-2 RNA is generally detectable in upper and lower respiratory specimens during the acute phase of infection. Negative results do not preclude SARS-CoV-2 infection, do not rule out co-infections with other pathogens, and should not be used as the sole basis for treatment or other patient management decisions. Negative results must be combined with clinical observations, patient history, and epidemiological information. The expected result is Negative. Fact Sheet for Patients: SugarRoll.be Fact Sheet for Healthcare Providers: https://www.woods-mathews.com/ This test is not yet approved or cleared by the [place] FDA and  has been authorized for detection and/or diagnosis of SARS-CoV-2 by FDA under an Emergency Use Authorization (EUA). This EUA will remain  in effect (meaning this test can be used) for the duration of the COVID-19 declaration under Section 56 4(b)(1) of the Act, 21 U.S.C. section 360bbb-3(b)(1), unless the authorization is terminated or revoked sooner. Performed at [facility], [place] [address] [place], [place] [address]   Urine culture     Status: Abnormal   Collection Time: 10/06/19  4:02 PM   Specimen: In/Out Cath Urine  Result Value Ref Range Status   Specimen Description IN/OUT  CATH URINE  Final   Special Requests   Final    NONE Performed at [facility], [place] [address], [place], [place] [address]    Culture (A)  Final    30,000 COLONIES/mL ESCHERICHIA COLI Confirmed Extended Spectrum Beta-Lactamase Producer (  ESBL).  In bloodstream infections from ESBL organisms, carbapenems are preferred over piperacillin/tazobactam. They are shown to have a lower risk of mortality. 10,000 COLONIES/mL STAPHYLOCOCCUS EPIDERMIDIS    Report Status 10/08/2019 FINAL  Final   Organism ID, Bacteria ESCHERICHIA COLI (A)  Final   Organism ID, Bacteria STAPHYLOCOCCUS EPIDERMIDIS (A)  Final      Susceptibility   Escherichia coli - MIC*    AMPICILLIN >=32 RESISTANT Resistant     CEFAZOLIN >=64 RESISTANT Resistant     CEFTRIAXONE >=64 RESISTANT Resistant     CIPROFLOXACIN >=4 RESISTANT Resistant     GENTAMICIN <=1 SENSITIVE Sensitive     IMIPENEM <=0.25 SENSITIVE Sensitive     NITROFURANTOIN <=16 SENSITIVE Sensitive     TRIMETH/SULFA <=20 SENSITIVE Sensitive     AMPICILLIN/SULBACTAM 4 SENSITIVE Sensitive     PIP/TAZO <=4 SENSITIVE Sensitive     Extended ESBL POSITIVE Resistant     * 30,000 COLONIES/mL ESCHERICHIA COLI   Staphylococcus epidermidis - MIC*    CIPROFLOXACIN 2 INTERMEDIATE Intermediate     GENTAMICIN <=0.5 SENSITIVE Sensitive     NITROFURANTOIN <=16 SENSITIVE Sensitive     OXACILLIN >=4 RESISTANT Resistant     TETRACYCLINE <=1 SENSITIVE Sensitive     VANCOMYCIN 1 SENSITIVE Sensitive     TRIMETH/SULFA <=10 SENSITIVE Sensitive     CLINDAMYCIN <=0.25 SENSITIVE Sensitive     RIFAMPIN <=0.5 SENSITIVE Sensitive     Inducible Clindamycin NEGATIVE Sensitive     * 10,000 COLONIES/mL STAPHYLOCOCCUS EPIDERMIDIS  MRSA PCR Screening     Status: None   Collection Time: 10/08/19 10:50 PM   Specimen: Nasal Mucosa; Nasopharyngeal  Result Value Ref Range Status   MRSA by PCR NEGATIVE NEGATIVE Final    Comment:        The GeneXpert MRSA Assay (FDA approved for NASAL  specimens only), is one component of a comprehensive MRSA colonization surveillance program. It is not intended to diagnose MRSA infection nor to guide or monitor treatment for MRSA infections. Performed at [facility], [address] [place], [place] [address]   Body fluid culture     Status: None (Preliminary result)   Collection Time: 10/10/19 12:59 PM   Specimen: A: Abdomen; Peritoneal Fluid   B: Abdomen; Peritoneal Fluid  Result Value Ref Range Status   Specimen Description ABDOMEN  Final   Special Requests NONE  Final   Gram Stain   Final    FEW WBC PRESENT,BOTH PMN AND MONONUCLEAR NO ORGANISMS SEEN    Culture   Final    NO GROWTH < 24 HOURS Performed at [facility], [address] [place], [place] [address]    Report Status PENDING  Incomplete  Aerobic/Anaerobic Culture (surgical/deep wound)     Status: None (Preliminary result)   Collection Time: 10/10/19  2:26 PM   Specimen: Abscess  Result Value Ref Range Status   Specimen Description ABSCESS LIVER  Final   Special Requests NONE  Final   Gram Stain   Final    ABUNDANT WBC PRESENT,BOTH PMN AND MONONUCLEAR FEW GRAM VARIABLE ROD RARE GRAM NEGATIVE RODS    Culture   Final    MODERATE GRAM NEGATIVE RODS IDENTIFICATION AND SUSCEPTIBILITIES TO FOLLOW Performed at [facility], [address] [place], [place] [address]    Report Status PENDING  Incomplete  Culture, blood (routine x 2)     Status: None (Preliminary result)   Collection Time: 10/11/19  5:05 PM   Specimen:  BLOOD RIGHT ARM  Result Value Ref Range Status   Specimen Description BLOOD RIGHT ARM  Final   Special Requests AEROBIC BOTTLE ONLY Blood Culture adequate volume  Final   Culture   Final    NO GROWTH < 24 HOURS Performed at [facility], [address], [place], [place] [address]    Report Status PENDING  Incomplete  Culture, blood (routine x 2)     Status: None (Preliminary result)   Collection Time: 10/11/19  5:05  PM   Specimen: BLOOD RIGHT ARM  Result Value Ref Range Status   Specimen Description BLOOD RIGHT ARM  Final   Special Requests AEROBIC BOTTLE ONLY Blood Culture adequate volume  Final   Culture   Final    NO GROWTH < 24 HOURS Performed at [facility], [address], [place], [place] [address]    Report Status PENDING  Incomplete         Radiology Studies: Ir US Guide Bx Asp/drain  Result Date: 10/10/2019 INDICATION: 73 year old with bacteremia and concern for cholangitis and hepatic abscess. Cystic duct was patent on recent HIDA examination. EXAM: ULTRASOUND-GUIDED PARACENTESIS ULTRASOUND-GUIDED HEPATIC ABSCESS ASPIRATION MEDICATIONS: The patient is currently admitted to the hospital and receiving intravenous antibiotics. ANESTHESIA/SEDATION: Fentanyl 12.5 mcg IV; Versed 0.5 mg IV Moderate Sedation Time:  12 minutes The patient was continuously monitored during the procedure by the interventional radiology nurse under my direct supervision. COMPLICATIONS: None immediate. PROCEDURE: Informed written consent was obtained from the patient's son after a thorough discussion of the procedural risks, benefits and alternatives. All questions were addressed. Maximal Sterile Barrier Technique was utilized including caps, mask, sterile gowns, sterile gloves, sterile drape, hand hygiene and skin antiseptic. A timeout was performed prior to the initiation of the procedure. Patient was placed supine on the interventional table. Ultrasound was used to evaluate the abdomen. New perihepatic ascites was identified. Skin was anesthetized with 1% lidocaine. Using ultrasound guidance, a 19 gauge Yueh catheter was directed into the perihepatic space and a paracentesis was performed. The paracentesis catheter was kept in place as attention was directed to the intrahepatic lesion. Ultrasound demonstrated irregular hypoechoic areas in liver concerning for intrahepatic abscess. After the perihepatic ascites had  been removed, a new 19 gauge Yueh catheter was directed into the liver and into the hypoechoic hepatic area with ultrasound guidance. Tan colored purulent fluid was aspirated. Approximately 10 mL of purulent fluid was removed from the liver. Following the aspiration, there was no significant collection remaining. Stiff Amplatz wire was advanced into the liver but with the wire was not forming within a well-defined collection. Drain was not placed. Yueh catheter was removed. Small amount of bloody material was removed from the paracentesis catheter prior to removal. Bandage placed over the puncture sites. FINDINGS: New perihepatic ascites. Approximately 220 mL of predominantly yellow fluid was removed from the perihepatic space. Small amount of blood was aspirated from the paracentesis catheter after the liver aspiration. Small irregular hypoechoic collections in the liver. Approximately 10 mL of purulent fluid was removed from these collections and findings are compatible with a poorly formed hepatic abscess. The hepatic abscess was decompressed following aspiration. IMPRESSION: 1. Ultrasound-guided aspiration of the intrahepatic abscess/abscesses. Total of 10 mL of purulent fluid was removed. There was not a well-defined collection within the liver based on wire placement. In addition, the liver abscess appeared to be completely decompressed by the end of the procedure. Therefore, hepatic drain was not placed. In addition, there  is concern about placing an intrahepatic drain in the setting of ascites. Abscess fluid was sent for culture. 2. Ultrasound-guided paracentesis. Approximately 220 mL of peritoneal fluid was removed. Fluid was sent for analysis. Electronically Signed   By: Richarda Overlie M.D.   On: 10/10/2019 15:12   Ir Paracentesis  Result Date: 10/10/2019 INDICATION: 73 year old with bacteremia and concern for cholangitis and hepatic abscess. Cystic duct was patent on recent HIDA examination. EXAM:  ULTRASOUND-GUIDED PARACENTESIS ULTRASOUND-GUIDED HEPATIC ABSCESS ASPIRATION MEDICATIONS: The patient is currently admitted to the hospital and receiving intravenous antibiotics. ANESTHESIA/SEDATION: Fentanyl 12.5 mcg IV; Versed 0.5 mg IV Moderate Sedation Time:  12 minutes The patient was continuously monitored during the procedure by the interventional radiology nurse under my direct supervision. COMPLICATIONS: None immediate. PROCEDURE: Informed written consent was obtained from the patient's son after a thorough discussion of the procedural risks, benefits and alternatives. All questions were addressed. Maximal Sterile Barrier Technique was utilized including caps, mask, sterile gowns, sterile gloves, sterile drape, hand hygiene and skin antiseptic. A timeout was performed prior to the initiation of the procedure. Patient was placed supine on the interventional table. Ultrasound was used to evaluate the abdomen. New perihepatic ascites was identified. Skin was anesthetized with 1% lidocaine. Using ultrasound guidance, a 19 gauge Yueh catheter was directed into the perihepatic space and a paracentesis was performed. The paracentesis catheter was kept in place as attention was directed to the intrahepatic lesion. Ultrasound demonstrated irregular hypoechoic areas in liver concerning for intrahepatic abscess. After the perihepatic ascites had been removed, a new 19 gauge Yueh catheter was directed into the liver and into the hypoechoic hepatic area with ultrasound guidance. Tan colored purulent fluid was aspirated. Approximately 10 mL of purulent fluid was removed from the liver. Following the aspiration, there was no significant collection remaining. Stiff Amplatz wire was advanced into the liver but with the wire was not forming within a well-defined collection. Drain was not placed. Yueh catheter was removed. Small amount of bloody material was removed from the paracentesis catheter prior to removal. Bandage  placed over the puncture sites. FINDINGS: New perihepatic ascites. Approximately 220 mL of predominantly yellow fluid was removed from the perihepatic space. Small amount of blood was aspirated from the paracentesis catheter after the liver aspiration. Small irregular hypoechoic collections in the liver. Approximately 10 mL of purulent fluid was removed from these collections and findings are compatible with a poorly formed hepatic abscess. The hepatic abscess was decompressed following aspiration. IMPRESSION: 1. Ultrasound-guided aspiration of the intrahepatic abscess/abscesses. Total of 10 mL of purulent fluid was removed. There was not a well-defined collection within the liver based on wire placement. In addition, the liver abscess appeared to be completely decompressed by the end of the procedure. Therefore, hepatic drain was not placed. In addition, there is concern about placing an intrahepatic drain in the setting of ascites. Abscess fluid was sent for culture. 2. Ultrasound-guided paracentesis. Approximately 220 mL of peritoneal fluid was removed. Fluid was sent for analysis. Electronically Signed   By: Richarda Overlie M.D.   On: 10/10/2019 15:12        Scheduled Meds:  chlorhexidine  15 mL Mouth Rinse BID   Chlorhexidine Gluconate Cloth  6 each Topical Daily   flecainide  50 mg Oral BID   insulin aspart  0-20 Units Subcutaneous Q4H   insulin glargine  10 Units Subcutaneous Daily   mouth rinse  15 mL Mouth Rinse q12n4p   pantoprazole (PROTONIX) IV  40 mg Intravenous  Q24H   Continuous Infusions:  sodium chloride Stopped (10/09/19 0959)   sodium chloride     cefTRIAXone (ROCEPHIN)  IV Stopped (10/11/19 2247)   lactated ringers 125 mL/hr at 10/12/19 0523     LOS: 6 days    Vonzella Nipple, MD [facility] Pager on amion  If 7PM-7AM, please contact night-coverage [email or web address] Password [email or web address] 10/12/2019, 10:52 AM

## 2019-10-12 NOTE — Progress Notes (Signed)
Results for Soulliere, Kaidence H (MRN [id number]) as of 10/12/2019 09:20  Ref. Range 10/11/2019 21:13 10/12/2019 00:37 10/12/2019 01:28 10/12/2019 04:12  Glucose-Capillary Latest Ref Range: 70 - 99 mg/dL 89 61 (L) 139 (H) 84  Noted that patient continues to have blood sugars less than 70 mg/dl.  Recommend decreasing Novolog correction scale to SENSITIVE every 4 hours, especially if continuing Lantus. Will continue to monitor blood sugars while in the hospital.  Harvel Ricks RN BSN CDE Diabetes Coordinator Pager: [phone]  8am-5pm

## 2019-10-12 NOTE — Progress Notes (Signed)
IV started by anesthesia staff. Will proceed with procedure today per Dr. Marlou Porch.

## 2019-10-12 NOTE — Progress Notes (Signed)
Peripherally Inserted Central Catheter/Midline Placement  The IV Nurse has discussed with the patient and/or persons authorized to consent for the patient, the purpose of this procedure and the potential benefits and risks involved with this procedure.  The benefits include less needle sticks, lab draws from the catheter, and the patient may be discharged home with the catheter. Risks include, but not limited to, infection, bleeding, blood clot (thrombus formation), and puncture of an artery; nerve damage and irregular heartbeat and possibility to perform a PICC exchange if needed/ordered by physician.  Alternatives to this procedure were also discussed.  Bard Power PICC patient education guide, fact sheet on infection prevention and patient information card has been provided to patient /or left at bedside.  Interpretation via iPad interpreter [phone].  PICC/Midline Placement Documentation  PICC Single Lumen 44/31/54 PICC Right Basilic 32 cm 0 cm (Active)  Indication for Insertion or Continuance of Line Home intravenous therapies (PICC only) 10/12/19 2226  Exposed Catheter (cm) 0 cm 10/12/19 2226  Site Assessment Clean;Dry;Intact 10/12/19 2226  Line Status Flushed;Saline locked;Blood return noted 10/12/19 2226  Dressing Type Transparent 10/12/19 2226  Dressing Status Clean;Dry;Intact 10/12/19 2226  Dressing Intervention New dressing 10/12/19 2226  Dressing Change Due 10/19/19 10/12/19 2226       Purcell, Nicolette Bang 10/12/2019, 10:28 PM

## 2019-10-12 NOTE — Progress Notes (Signed)
  Echocardiogram Echocardiogram Transesophageal has been performed.  Courtney [name] 10/12/2019, 3:50 PM

## 2019-10-12 NOTE — Consult Note (Signed)
[address]       [place] [address]             [phone]        Mahsa H Roskos Sharkey Medical Record #[id number] Date of Birth: 05/02/1946  Referring: No ref. provider found Primary Care: Patient, No Pcp Per Primary Cardiologist:No primary care provider on file.  Chief Complaint:    Chief Complaint  Patient presents with  . AMS    History of Present Illness:     73 year old female admitted on October 28 with altered mental status and was found to have a Klebsiella bacteremia.  On further work-up she was noted to have a large liver abscess.  She also underwent a TEE which showed a questionable vegetation on the mitral valve without any incompetence to the valve.  CTS was consulted for further evaluation.    Past Medical History:  Diagnosis Date  . Allergy   . Diabetes mellitus type 2, diet-controlled ([place])   . PAF (paroxysmal atrial fibrillation) ([place]) 09/04/2017  . Sinus bradycardia 09/04/2017   HR 30s at times  . Tachy-brady syndrome ([place]) 09/04/2017    Past Surgical History:  Procedure Laterality Date  . IR PARACENTESIS  10/10/2019  . IR US GUIDE BX ASP/DRAIN  10/10/2019  . PACEMAKER IMPLANT N/A 09/05/2017   Procedure: Pacemaker Implant;  Surgeon: Deboraha Sprang, MD;  Location: [facility];  Service: Cardiovascular;  Laterality: N/A;      Social History   Tobacco Use  Smoking Status Current Every Day Smoker  Smokeless Tobacco Never Used    Social History   Substance and Sexual Activity  Alcohol Use No  . Frequency: Never     No Known Allergies     Current Facility-Administered Medications  Medication Dose Route Frequency Provider Last Rate Last Dose  . 0.9 %  sodium chloride infusion   Intravenous PRN Dessa Phi, DO   Stopped at 10/09/19 [phone]  . acetaminophen (TYLENOL) tablet 650 mg  650 mg Oral Q6H PRN Dessa Phi, DO   650 mg at 10/11/19 1959  . cefTRIAXone (ROCEPHIN) 2 g in sodium chloride 0.9 % 100 mL  IVPB  2 g Intravenous Q24H Carlyle Basques, MD   Stopped at 10/11/19 2247  . chlorhexidine (PERIDEX) 0.12 % solution 15 mL  15 mL Mouth Rinse BID Agarwala, Einar Grad, MD   15 mL at 10/11/19 1041  . Chlorhexidine Gluconate Cloth 2 % PADS 6 each  6 each Topical Daily Dessa Phi, DO   6 each at 10/11/19 1528  . dextrose 50 % solution 25 mL  25 mL Intravenous PRN Oswald Hillock, MD      . flecainide [facility]) tablet 50 mg  50 mg Oral BID Oswald Hillock, MD   50 mg at 10/11/19 2116  . insulin aspart (novoLOG) injection 0-9 Units  0-9 Units Subcutaneous TID WC Matcha, Anupama, MD      . insulin glargine (LANTUS) injection 10 Units  10 Units Subcutaneous Daily Blount, Xenia T, NP   10 Units at 10/11/19 1057  . lactated ringers infusion   Intravenous Continuous Dessa Phi, DO 125 mL/hr at 10/12/19 3559    . MEDLINE mouth rinse  15 mL Mouth Rinse q12n4p Agarwala, Ravi, MD   15 mL at 10/11/19 1636  . pantoprazole (PROTONIX) injection 40 mg  40 mg Intravenous Q24H Alfonzo Feller, NP   40 mg at 10/10/19 1839    Medications Prior  to Admission  Medication Sig Dispense Refill Last Dose  . acetaminophen (TYLENOL) 325 MG tablet Take 1-2 tablets (325-650 mg total) by mouth every 6 (six) hours as needed for mild pain or moderate pain.   10/05/2019 at Unknown time  . apixaban (ELIQUIS) 5 MG TABS tablet Take 1 tablet (5 mg total) by mouth 2 (two) times daily. 180 tablet 1 10/05/2019 at 1500  . flecainide (TAMBOCOR) 50 MG tablet Take 1 tablet (50 mg total) by mouth 2 (two) times daily. 180 tablet 0 10/05/2019 at 0430  . metoprolol tartrate (LOPRESSOR) 50 MG tablet Take 1 tablet (50 mg total) by mouth 2 (two) times daily. Please keep upcoming appt in September with Dr. Caryl Comes before anymore refills. Thank you 180 tablet 0 10/05/2019 at 1500  . glucose monitoring kit (FREESTYLE) monitoring kit Check cbgs bid as instructed by pcp. 1 each 11     Family History  Problem Relation Age of Onset  . Diabetes Son       Review of Systems:   Review of Systems  Unable to perform ROS: Language      Physical Exam: BP 104/72 (BP Location: Left Arm)   Pulse 60   Temp 98.5 F (36.9 C) (Oral)   Resp 18   Ht 3' 7.31" (1.1 m)   Wt 47.7 kg   SpO2 95%   BMI 39.42 kg/m  Physical Exam  HENT:  Head: Normocephalic and atraumatic.  Neck: No tracheal deviation present.  Cardiovascular: Normal rate and regular rhythm.  Pulmonary/Chest: Effort normal. No respiratory distress.  Abdominal: She exhibits no distension.  Skin: Skin is warm and dry.      Diagnostic Studies & Laboratory data:     Echo:  1. Left ventricular ejection fraction, by visual estimation, is 55 to 60%. The left ventricle has normal function. There is no left ventricular hypertrophy.  2. Global right ventricle has normal systolic function.The right ventricular size is normal. No increase in right ventricular wall thickness.  3. No vegetations on pacer wires.  4. Left atrial size was normal.  5. Right atrial size was normal.  6. Mild mitral valve prolapse.  7. The mitral valve is abnormal. Mild mitral valve regurgitation. No evidence of mitral stenosis.  8. No trisuspid valve vegetation visualized.  9. The tricuspid valve is normal in structure. Tricuspid valve regurgitation is mild. 10. The aortic valve is tricuspid. Aortic valve regurgitation is not visualized. 11. No pulmonic vegetation present. 12. The pulmonic valve was grossly normal. Pulmonic valve regurgitation is not visualized. 13. Moderate plaque invoving the descending aorta. 14. A pacer wire is visualized in the RV and RA. 15. In the setting of current bacteremia and implanted pacemaker, there do appear to be small filamentous strand-like material attached to the atrial surface of the mitral valve and just proximal to the aortic valve in the LVOT which could possibly  represent vegetative material. This does not have the clinical appearance of the typical thick or shaggy  vegetation. Nonetheless, consider treatment for possible endocarditis.   I have independently reviewed the above radiologic studies and discussed with the patient   Recent Lab Findings: Lab Results  Component Value Date   WBC 22.2 (H) 10/12/2019   HGB 10.7 (L) 10/12/2019   HCT 31.3 (L) 10/12/2019   PLT 139 (L) 10/12/2019   GLUCOSE 128 (H) 10/12/2019   ALT 334 (H) 10/12/2019   AST 267 (H) 10/12/2019   NA 140 10/12/2019   K 3.7 10/12/2019   CL  111 10/12/2019   CREATININE 0.61 10/12/2019   BUN 9 10/12/2019   CO2 21 (L) 10/12/2019   TSH 0.894 10/08/2019   INR 1.3 (H) 10/09/2019   HGBA1C 16.4 (H) 10/07/2019      Assessment / Plan:   73 year old female with Klebsiella bacteremia and possible small vegetation on the mitral valve.  There is no evidence of valve dysfunction on TEE.  From a surgical standpoint there is no class I indications for intervention, however given the fact that she has pacing wires and there is concern for endocarditis EP should be consulted to determine whether or not her pacing lead should be removed.  We will follow peripherally.     I  spent 30 minutes counseling the patient face to face.   Lajuana Matte 10/12/2019 5:50 PM

## 2019-10-12 NOTE — Progress Notes (Signed)
Progress Note    ASSESSMENT AND PLAN:   1. Klebsiella bacteremia / abnormal liver enzymes presumably secondary to liver abscess s/p drainage (GNR). No elevation in alk phos nor bilirubin and no biliary duct dilation on imaging. No evidence for cholecystitis.  Liver enzymes continuously improving as of yesterday, none ordered for today.  WBC up 20.8 >> 22.2 overnight. Repeat blood cultures from yesterday are pending. -ID following, on Rocephin.  -Awaiting TTE to check for vegetation of pacer leads -check liver tests this am -Considering CT AP w/ contrast over weekend to re-evaluate post aspiration.  2. Left chest discomfort, declined pain meds. On Telemetry    3. Severe hypoalbuminemia, 1.5. Multifactorial : acute illness, frequent NPO status, probable diminished appetite. Detician is following.     SUBJECTIVE    No abdominal pain. Has some mild left chest discomfort. Awaiting TEE   OBJECTIVE:     Vital signs in last 24 hours: Temp:  [97.9 F (36.6 C)-99.6 F (37.6 C)] 98.2 F (36.8 C) (10/30 0914) Pulse Rate:  [46-77] 72 (10/30 0914) Resp:  [17-29] 18 (10/30 0914) BP: (91-128)/(46-73) 102/46 (10/30 0914) SpO2:  [93 %-97 %] 96 % (10/30 0914) Last BM Date: 10/12/19 General:   Alert, well-developed female in NAD EENT:  Normal hearing, non icteric sclera, conjunctive pink.  Heart:  Regular rate and rhythm;  No lower extremity edema   Pulm: Normal respiratory effort, lungs CTA bilaterally without wheezes or crackles. Abdomen:  Soft, nondistended, nontender.  Normal bowel sounds.          Neurologic:  Alert and  oriented x4;  grossly normal neurologically. Psych:  Pleasant, cooperative.  Normal mood and affect.   Intake/Output from previous day: 10/29 0701 - 10/30 0700 In: 2828.9 [P.O.:530; I.V.:2098.8; IV Piggyback:200.1] Out: 400 [Urine:400] Intake/Output this shift: No intake/output data recorded.  Lab Results: Recent Labs    10/10/19 0419 10/11/19 0233  10/12/19 0235  WBC 20.6* 20.8* 22.2*  HGB 13.0 11.9* 10.7*  HCT 36.2 34.1* 31.3*  PLT 60* 98* 139*   BMET Recent Labs    10/11/19 0233 10/11/19 1645 10/12/19 0235  NA 138 139 140  K 3.2* 3.7 3.7  CL 112* 110 111  CO2 18* 21* 21*  GLUCOSE 80 129* 128*  BUN '23 15 9  '$ CREATININE 0.82 0.73 0.61  CALCIUM 7.4* 7.6* 7.5*   LFT Recent Labs    10/11/19 0233  PROT 4.4*  ALBUMIN 1.5*  AST 273*  ALT 370*  ALKPHOS 122  BILITOT 1.0  BILIDIR 0.2  IBILI 0.8   PT/INR Recent Labs    10/09/19 1102  LABPROT 16.4*  INR 1.3*   Hepatitis Panel No results for input(s): HEPBSAG, HCVAB, HEPAIGM, HEPBIGM in the last 72 hours.  Ir US Guide Bx Asp/drain  Result Date: 10/10/2019 INDICATION: 73 year old with bacteremia and concern for cholangitis and hepatic abscess. Cystic duct was patent on recent HIDA examination. EXAM: ULTRASOUND-GUIDED PARACENTESIS ULTRASOUND-GUIDED HEPATIC ABSCESS ASPIRATION MEDICATIONS: The patient is currently admitted to the hospital and receiving intravenous antibiotics. ANESTHESIA/SEDATION: Fentanyl 12.5 mcg IV; Versed 0.5 mg IV Moderate Sedation Time:  12 minutes The patient was continuously monitored during the procedure by the interventional radiology nurse under my direct supervision. COMPLICATIONS: None immediate. PROCEDURE: Informed written consent was obtained from the patient's son after a thorough discussion of the procedural risks, benefits and alternatives. All questions were addressed. Maximal Sterile Barrier Technique was utilized including caps, mask, sterile gowns, sterile gloves, sterile drape, hand hygiene and  skin antiseptic. A timeout was performed prior to the initiation of the procedure. Patient was placed supine on the interventional table. Ultrasound was used to evaluate the abdomen. New perihepatic ascites was identified. Skin was anesthetized with 1% lidocaine. Using ultrasound guidance, a 19 gauge Yueh catheter was directed into the perihepatic  space and a paracentesis was performed. The paracentesis catheter was kept in place as attention was directed to the intrahepatic lesion. Ultrasound demonstrated irregular hypoechoic areas in liver concerning for intrahepatic abscess. After the perihepatic ascites had been removed, a new 19 gauge Yueh catheter was directed into the liver and into the hypoechoic hepatic area with ultrasound guidance. Tan colored purulent fluid was aspirated. Approximately 10 mL of purulent fluid was removed from the liver. Following the aspiration, there was no significant collection remaining. Stiff Amplatz wire was advanced into the liver but with the wire was not forming within a well-defined collection. Drain was not placed. Yueh catheter was removed. Small amount of bloody material was removed from the paracentesis catheter prior to removal. Bandage placed over the puncture sites. FINDINGS: New perihepatic ascites. Approximately 220 mL of predominantly yellow fluid was removed from the perihepatic space. Small amount of blood was aspirated from the paracentesis catheter after the liver aspiration. Small irregular hypoechoic collections in the liver. Approximately 10 mL of purulent fluid was removed from these collections and findings are compatible with a poorly formed hepatic abscess. The hepatic abscess was decompressed following aspiration. IMPRESSION: 1. Ultrasound-guided aspiration of the intrahepatic abscess/abscesses. Total of 10 mL of purulent fluid was removed. There was not a well-defined collection within the liver based on wire placement. In addition, the liver abscess appeared to be completely decompressed by the end of the procedure. Therefore, hepatic drain was not placed. In addition, there is concern about placing an intrahepatic drain in the setting of ascites. Abscess fluid was sent for culture. 2. Ultrasound-guided paracentesis. Approximately 220 mL of peritoneal fluid was removed. Fluid was sent for  analysis. Electronically Signed   By: Markus Daft M.D.   On: 10/10/2019 15:12   Ir Paracentesis  Result Date: 10/10/2019 INDICATION: 73 year old with bacteremia and concern for cholangitis and hepatic abscess. Cystic duct was patent on recent HIDA examination. EXAM: ULTRASOUND-GUIDED PARACENTESIS ULTRASOUND-GUIDED HEPATIC ABSCESS ASPIRATION MEDICATIONS: The patient is currently admitted to the hospital and receiving intravenous antibiotics. ANESTHESIA/SEDATION: Fentanyl 12.5 mcg IV; Versed 0.5 mg IV Moderate Sedation Time:  12 minutes The patient was continuously monitored during the procedure by the interventional radiology nurse under my direct supervision. COMPLICATIONS: None immediate. PROCEDURE: Informed written consent was obtained from the patient's son after a thorough discussion of the procedural risks, benefits and alternatives. All questions were addressed. Maximal Sterile Barrier Technique was utilized including caps, mask, sterile gowns, sterile gloves, sterile drape, hand hygiene and skin antiseptic. A timeout was performed prior to the initiation of the procedure. Patient was placed supine on the interventional table. Ultrasound was used to evaluate the abdomen. New perihepatic ascites was identified. Skin was anesthetized with 1% lidocaine. Using ultrasound guidance, a 19 gauge Yueh catheter was directed into the perihepatic space and a paracentesis was performed. The paracentesis catheter was kept in place as attention was directed to the intrahepatic lesion. Ultrasound demonstrated irregular hypoechoic areas in liver concerning for intrahepatic abscess. After the perihepatic ascites had been removed, a new 19 gauge Yueh catheter was directed into the liver and into the hypoechoic hepatic area with ultrasound guidance. Tan colored purulent fluid was aspirated. Approximately 10 mL  of purulent fluid was removed from the liver. Following the aspiration, there was no significant collection  remaining. Stiff Amplatz wire was advanced into the liver but with the wire was not forming within a well-defined collection. Drain was not placed. Yueh catheter was removed. Small amount of bloody material was removed from the paracentesis catheter prior to removal. Bandage placed over the puncture sites. FINDINGS: New perihepatic ascites. Approximately 220 mL of predominantly yellow fluid was removed from the perihepatic space. Small amount of blood was aspirated from the paracentesis catheter after the liver aspiration. Small irregular hypoechoic collections in the liver. Approximately 10 mL of purulent fluid was removed from these collections and findings are compatible with a poorly formed hepatic abscess. The hepatic abscess was decompressed following aspiration. IMPRESSION: 1. Ultrasound-guided aspiration of the intrahepatic abscess/abscesses. Total of 10 mL of purulent fluid was removed. There was not a well-defined collection within the liver based on wire placement. In addition, the liver abscess appeared to be completely decompressed by the end of the procedure. Therefore, hepatic drain was not placed. In addition, there is concern about placing an intrahepatic drain in the setting of ascites. Abscess fluid was sent for culture. 2. Ultrasound-guided paracentesis. Approximately 220 mL of peritoneal fluid was removed. Fluid was sent for analysis. Electronically Signed   By: Markus Daft M.D.   On: 10/10/2019 15:12    Active Problems:   Altered mental status   AMS (altered mental status)     LOS: 6 days   Tye Savoy ,NP 10/12/2019, 10:24 AM

## 2019-10-12 NOTE — Plan of Care (Signed)
Nursing will continue to monitor.  

## 2019-10-12 NOTE — Interval H&P Note (Signed)
History and Physical Interval Note:  10/12/2019 1:30 PM  Courtney [name]  has presented today for surgery, with the diagnosis of [place].  The various methods of treatment have been discussed with the patient and family. After consideration of risks, benefits and other options for treatment, the patient has consented to  Procedure(s): TRANSESOPHAGEAL ECHOCARDIOGRAM (TEE) (N/A) as a surgical intervention.  The patient's history has been reviewed, patient examined, no change in status, stable for surgery.  I have reviewed the patient's chart and labs.  Questions were answered to the patient's satisfaction.     [employer]

## 2019-10-12 NOTE — CV Procedure (Signed)
   Transesophageal Echocardiogram  Indications: Bacteremia  Time out performed  During this procedure the patient is administered a total of Versed 2 mg and Fentanyl 25 mcg to achieve and maintain moderate conscious sedation.  The patient's heart rate, blood pressure, and oxygen saturation are monitored continuously during the procedure. The period of conscious sedation is 20 minutes, of which I was present face-to-face 100% of this time.  Findings:  Left Ventricle: Normal EF 55%  Mitral Valve: Mild anterior leaflet prolapse with mild mitral regurgitation.  There are faint, filamentous strands attached to the atrial surface of the mitral valve leaflets, freely mobile, challenging to visualize.  This may represent vegetative material versus degenerative material of mitral valve.  They are not as robust as ruptured cords.  Aortic Valve: Trileaflet, no significant aortic regurgitation.  There are a conglomeration of small filamentous strands attached to a segment of the left ventricular outflow tract approximately 5 mm below the aortic valve leaflets that are freely mobile, possibly representing vegetative material.  Tricuspid Valve: Normal-appearing valve.  Left Atrium: No left atrial thrombus, no shunt  Right atrium: Pacemaker wire normal with no evidence of vegetations  Right ventricle: Pacemaker wire normal with no evidence of vegetations  Impressions: In the setting of current bacteremia and implanted pacemaker, there do appear to be small filamentous strand-like material attached to the atrial surface of the mitral valve and just proximal to the aortic valve in the LVOT which could possibly represent vegetative material.  This does not have the clinical appearance of the typical thick or shaggy vegetation.  Nonetheless, consider treatment for possible endocarditis.    Candee Furbish, MD

## 2019-10-12 NOTE — Progress Notes (Addendum)
[facility]    Date of Admission:  10/06/2019   Total days of antibiotics 7/day 2 of ceftriaxone           ID: Courtney [name] is a 73 y.o. female with sepsis found to have disseminated infection with kleb pneumonia with hepatic abscess, +/- pneumonia Active Problems:   Altered mental status   AMS (altered mental status)    Subjective: Underwent TEE and found to have vegetation on both MV and AV (copy of the read is below). Afebrile overnight but WBC continues to trend upward  RN reports that she has limited venous access, difficulty with piv  micro  evaluated kleb pneumo strain has + string test c/w hypervirulent kleb pneumo strain  TEE REPORT: Impressions: In the setting of current bacteremia and implanted pacemaker, there do appear to be small filamentous strand-like material attached to the atrial surface of the mitral valve and just proximal to the aortic valve in the LVOT which could possibly represent vegetative material.  This does not have the clinical appearance of the typical thick or shaggy vegetation.   Medications:  . [MAR Hold] chlorhexidine  15 mL Mouth Rinse BID  . [MAR Hold] Chlorhexidine Gluconate Cloth  6 each Topical Daily  . [MAR Hold] flecainide  50 mg Oral BID  . insulin aspart  0-9 Units Subcutaneous TID WC  . [MAR Hold] insulin glargine  10 Units Subcutaneous Daily  . [MAR Hold] mouth rinse  15 mL Mouth Rinse q12n4p  . [MAR Hold] pantoprazole (PROTONIX) IV  40 mg Intravenous Q24H    Objective: Vital signs in last 24 hours: Temp:  [97.9 F (36.6 C)-98.8 F (37.1 C)] 97.9 F (36.6 C) (10/30 1303) Pulse Rate:  [59-77] 63 (10/30 1540) Resp:  [14-23] 18 (10/30 1540) BP: (91-153)/(46-70) 116/52 (10/30 1540) SpO2:  [95 %-100 %] 95 % (10/30 1540) Weight:  [47.7 kg] 47.7 kg (10/30 1303)  gen = sleeping comfortably Pulm= ctab Cors = nl s1,s2, no g/m/r Abd= mild soreness with RUQ  Lab Results Recent Labs    10/11/19 0233  10/11/19 1645 10/12/19 0235  WBC 20.8*  --  22.2*  HGB 11.9*  --  10.7*  HCT 34.1*  --  31.3*  NA 138 139 140  K 3.2* 3.7 3.7  CL 112* 110 111  CO2 18* 21* 21*  BUN 23 15 9   CREATININE 0.82 0.73 0.61   Liver Panel Recent Labs    10/11/19 0233 10/12/19 0235  PROT 4.4* 4.3*  ALBUMIN 1.5* 1.6*  AST 273* 267*  ALT 370* 334*  ALKPHOS 122 123  BILITOT 1.0 0.9  BILIDIR 0.2 0.2  IBILI 0.8 0.7    Microbiology: 10/29 blood cx ngtd 10/28 liver abscess - kleb pneumo 10/28 peritoneal fluid- NGTD 10/24 blood cx kleb pneumo Studies/Results: No results found.   Assessment/Plan: Disseminated klebsiella pneumonia infection, suspect this is hypervirulent strain due to multiple foci as well as micro lab findings,-hyperviscosity on plates. Though the sensitivities appear good for many abtx groups, it has tendency to be aggressive and needs repeat imaging and drainage of sources  GNR endocarditis and cardiac devices are generally rare, however, this pathogen tends to have biofilm. TEE today showing evidence of possible MV involvement.   Recommend to get CT surgery evaluation for endocarditis management  Continue on ceftriaxone 2gm IV daily x 6 wk  Bacteremia = can place picc line since she has cleared bacteremia at 48hrs NGTD culture tomorrow  Hepatic  abscess Repeat abd CT to ensure no new hepatic abscesses have developed.-  transaminitis is likely associated with hepatic abscess rather than hep b (she is hep A and hep B immune)  dr lightfoot from CT surgery to see over the weekend  Dr Megan Salon available over the weekend/Dr comer to take over on Monday  [facility] Cell: [phone] Pager: [phone]  10/12/2019, 4:04 PM

## 2019-10-13 DIAGNOSIS — I38 Endocarditis, valve unspecified: Secondary | ICD-10-CM | POA: Diagnosis present

## 2019-10-13 DIAGNOSIS — E118 Type 2 diabetes mellitus with unspecified complications: Secondary | ICD-10-CM

## 2019-10-13 DIAGNOSIS — K750 Abscess of liver: Secondary | ICD-10-CM | POA: Diagnosis not present

## 2019-10-13 DIAGNOSIS — J189 Pneumonia, unspecified organism: Secondary | ICD-10-CM | POA: Diagnosis not present

## 2019-10-13 DIAGNOSIS — R7881 Bacteremia: Secondary | ICD-10-CM | POA: Diagnosis not present

## 2019-10-13 DIAGNOSIS — A498 Other bacterial infections of unspecified site: Secondary | ICD-10-CM | POA: Diagnosis present

## 2019-10-13 DIAGNOSIS — B961 Klebsiella pneumoniae [K. pneumoniae] as the cause of diseases classified elsewhere: Secondary | ICD-10-CM

## 2019-10-13 DIAGNOSIS — J449 Chronic obstructive pulmonary disease, unspecified: Secondary | ICD-10-CM

## 2019-10-13 HISTORY — DX: Klebsiella pneumoniae (k. pneumoniae) as the cause of diseases classified elsewhere: B96.1

## 2019-10-13 HISTORY — DX: Bacteremia: R78.81

## 2019-10-13 LAB — CBC
HCT: 30.9 % — ABNORMAL LOW (ref 36.0–46.0)
Hemoglobin: 10.4 g/dL — ABNORMAL LOW (ref 12.0–15.0)
MCH: 30.8 pg (ref 26.0–34.0)
MCHC: 33.7 g/dL (ref 30.0–36.0)
MCV: 91.4 fL (ref 80.0–100.0)
Platelets: 209 10*3/uL (ref 150–400)
RBC: 3.38 MIL/uL — ABNORMAL LOW (ref 3.87–5.11)
RDW: 13.2 % (ref 11.5–15.5)
WBC: 20.4 10*3/uL — ABNORMAL HIGH (ref 4.0–10.5)
nRBC: 0 % (ref 0.0–0.2)

## 2019-10-13 LAB — BODY FLUID CULTURE: Culture: NO GROWTH

## 2019-10-13 LAB — COMPREHENSIVE METABOLIC PANEL
ALT: 285 U/L — ABNORMAL HIGH (ref 0–44)
AST: 180 U/L — ABNORMAL HIGH (ref 15–41)
Albumin: 1.6 g/dL — ABNORMAL LOW (ref 3.5–5.0)
Alkaline Phosphatase: 111 U/L (ref 38–126)
Anion gap: 12 (ref 5–15)
BUN: 6 mg/dL — ABNORMAL LOW (ref 8–23)
CO2: 21 mmol/L — ABNORMAL LOW (ref 22–32)
Calcium: 7.6 mg/dL — ABNORMAL LOW (ref 8.9–10.3)
Chloride: 103 mmol/L (ref 98–111)
Creatinine, Ser: 0.61 mg/dL (ref 0.44–1.00)
GFR calc Af Amer: >60 mL/min (ref >60)
GFR calc non Af Amer: >60 mL/min (ref >60)
Glucose, Bld: 169 mg/dL — ABNORMAL HIGH (ref 70–99)
Potassium: 3.4 mmol/L — ABNORMAL LOW (ref 3.5–5.1)
Sodium: 136 mmol/L (ref 135–145)
Total Bilirubin: 1.1 mg/dL (ref 0.3–1.2)
Total Protein: 5.1 g/dL — ABNORMAL LOW (ref 6.5–8.1)

## 2019-10-13 LAB — GLUCOSE, CAPILLARY
Glucose-Capillary: 163 mg/dL — ABNORMAL HIGH (ref 70–99)
Glucose-Capillary: 239 mg/dL — ABNORMAL HIGH (ref 70–99)
Glucose-Capillary: 312 mg/dL — ABNORMAL HIGH (ref 70–99)
Glucose-Capillary: 147 mg/dL — ABNORMAL HIGH (ref 70–99)

## 2019-10-13 MED ORDER — CHLORHEXIDINE GLUCONATE CLOTH 2 % EX PADS
6.0000 | MEDICATED_PAD | Freq: Every day | CUTANEOUS | Status: DC
  Administered 2019-10-16 – 2019-10-18 (×3): 6 via TOPICAL

## 2019-10-13 MED ORDER — POTASSIUM CHLORIDE 10 MEQ/100ML IV SOLN
10.0000 meq | INTRAVENOUS | Status: AC
  Administered 2019-10-13 (×4): 10 meq via INTRAVENOUS
  Filled 2019-10-13 (×4): qty 100

## 2019-10-13 MED ORDER — ENOXAPARIN SODIUM 30 MG/0.3ML ~~LOC~~ SOLN
30.0000 mg | SUBCUTANEOUS | Status: DC
  Administered 2019-10-13 – 2019-10-14 (×2): 30 mg via SUBCUTANEOUS
  Filled 2019-10-13 (×2): qty 0.3

## 2019-10-13 MED ORDER — METRONIDAZOLE 500 MG PO TABS
500.0000 mg | ORAL_TABLET | Freq: Three times a day (TID) | ORAL | Status: DC
  Administered 2019-10-13 – 2019-10-15 (×6): 500 mg via ORAL
  Filled 2019-10-13 (×6): qty 1

## 2019-10-13 NOTE — Progress Notes (Signed)
Patient ID: Noga Fogg Reppond, female   DOB: Oct 23, 1946, 73 y.o.   MRN: [id number]         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  She seems to be improving.  I will continue ceftriaxone and add metronidazole for anaerobic coverage since the abscess Gram stain showed both gram variable and gram-negative rods and most liver abscesses are mixed aerobic/anaerobic affairs.  PLAN: 1. Continue ceftriaxone 2. Start metronidazole 3. Please call me for ID questions this weekend.  Principal Problem:   Gram-negative bacteremia Active Problems:   Endocarditis   Liver abscess   Klebsiella infection   Type 2 NIDDM-untreated   Chronic obstructive pulmonary disease (HCC)   Paroxysmal atrial fibrillation (HCC)   Pacemaker   Altered mental status   Scheduled Meds: . chlorhexidine  15 mL Mouth Rinse BID  . Chlorhexidine Gluconate Cloth  6 each Topical Daily  . Chlorhexidine Gluconate Cloth  6 each Topical Q0600  . enoxaparin (LOVENOX) injection  30 mg Subcutaneous Q24H  . flecainide  50 mg Oral BID  . insulin aspart  0-9 Units Subcutaneous TID WC  . insulin glargine  10 Units Subcutaneous Daily  . mouth rinse  15 mL Mouth Rinse q12n4p  . pantoprazole (PROTONIX) IV  40 mg Intravenous Q24H  . sodium chloride flush  10-40 mL Intracatheter Q12H   Continuous Infusions: . sodium chloride Stopped (10/09/19 0959)  . cefTRIAXone (ROCEPHIN)  IV 2 g (10/13/19 0036)  . lactated ringers Stopped (10/12/19 1056)   PRN Meds:.sodium chloride, acetaminophen, dextrose, sodium chloride flush    Review of Systems: Review of Systems  Unable to perform ROS: Language    No Known Allergies  OBJECTIVE: Vitals:   10/13/19 0640 10/13/19 0823 10/13/19 1300 10/13/19 1626  BP:  (!) 102/40 (!) 110/45 (!) 97/53 (!) 113/47  Pulse: 68 73 77 76  Resp: (!) 21 20 (!) 26 (!) 24  Temp: 98.5 F (36.9 C) 99.1 F (37.3 C) 100 F (37.8 C) 99.3 F (37.4 C)  TempSrc: Oral Oral Axillary Oral  SpO2: 94%     Weight:      Height:       Body mass index is 39.42 kg/m.  Physical Exam Constitutional:      Comments: She is resting quietly in bed.  Cardiovascular:     Rate and Rhythm: Normal rate and regular rhythm.     Heart sounds: No murmur.     Comments: No abnormalities noted at pacemaker site. Pulmonary:     Effort: Pulmonary effort is normal.     Breath sounds: Normal breath sounds.     Comments: Breath sounds normal anteriorly. Abdominal:     Palpations: Abdomen is soft. There is no mass.     Tenderness: There is no abdominal tenderness.     Lab Results Lab Results  Component Value Date   WBC 20.4 (H) 10/13/2019   HGB 10.4 (L) 10/13/2019   HCT 30.9 (L) 10/13/2019   MCV 91.4 10/13/2019   PLT 209 10/13/2019    Lab Results  Component Value Date   CREATININE 0.61 10/13/2019   BUN 6 (L) 10/13/2019   NA 136 10/13/2019   K 3.4 (L) 10/13/2019   CL 103  10/13/2019   CO2 21 (L) 10/13/2019    Lab Results  Component Value Date   ALT 285 (H) 10/13/2019   AST 180 (H) 10/13/2019   ALKPHOS 111 10/13/2019   BILITOT 1.1 10/13/2019     Microbiology: Recent Results (from the past 240 hour(s))  Blood Culture (routine x 2)     Status: Abnormal   Collection Time: 10/06/19 12:40 PM   Specimen: BLOOD  Result Value Ref Range Status   Specimen Description BLOOD RIGHT ANTECUBITAL  Final   Special Requests   Final    BOTTLES DRAWN AEROBIC AND ANAEROBIC Blood Culture results may not be optimal due to an inadequate volume of blood received in culture bottles   Culture  Setup Time   Final    IN BOTH AEROBIC AND ANAEROBIC BOTTLES GRAM NEGATIVE RODS CRITICAL RESULT CALLED TO, READ BACK BY AND VERIFIED WITH: Evelena Peat [facility] 10/07/19 0419 JDW Performed at [facility], [address] [place], [place] [address]    Culture KLEBSIELLA PNEUMONIAE (A)  Final   Report Status 10/09/2019 FINAL  Final   Organism ID, Bacteria KLEBSIELLA PNEUMONIAE  Final      Susceptibility   Klebsiella pneumoniae - MIC*    AMPICILLIN >=32 RESISTANT Resistant     CEFAZOLIN <=4 SENSITIVE Sensitive     CEFEPIME <=1 SENSITIVE Sensitive     CEFTAZIDIME <=1 SENSITIVE Sensitive     CEFTRIAXONE <=1 SENSITIVE Sensitive     CIPROFLOXACIN <=0.25 SENSITIVE Sensitive     GENTAMICIN <=1 SENSITIVE Sensitive     IMIPENEM 0.5 SENSITIVE Sensitive     TRIMETH/SULFA <=20 SENSITIVE Sensitive     AMPICILLIN/SULBACTAM 8 SENSITIVE Sensitive     PIP/TAZO <=4 SENSITIVE Sensitive     Extended ESBL NEGATIVE Sensitive     * KLEBSIELLA PNEUMONIAE  Blood Culture ID Panel (Reflexed)     Status: Abnormal   Collection Time: 10/06/19 12:40 PM  Result Value Ref Range Status   Enterococcus species NOT DETECTED NOT DETECTED Final   Listeria monocytogenes NOT DETECTED NOT DETECTED Final   Staphylococcus species NOT DETECTED NOT DETECTED Final   Staphylococcus aureus (BCID) NOT DETECTED NOT DETECTED Final   Streptococcus species NOT DETECTED NOT DETECTED Final   Streptococcus agalactiae NOT DETECTED NOT DETECTED Final   Streptococcus pneumoniae NOT DETECTED NOT DETECTED Final   Streptococcus pyogenes NOT DETECTED NOT DETECTED Final   Acinetobacter baumannii NOT DETECTED NOT DETECTED Final   Enterobacteriaceae species DETECTED (A) NOT DETECTED Final    Comment: Enterobacteriaceae represent a large family of gram-negative bacteria, not a single organism. CRITICAL RESULT CALLED TO, READ BACK BY AND VERIFIED WITH: G ABBOTT PHARMD 10/07/19 0419 JDW    Enterobacter cloacae complex NOT DETECTED NOT DETECTED Final   Escherichia coli NOT DETECTED NOT DETECTED Final   Klebsiella oxytoca NOT DETECTED NOT DETECTED Final   Klebsiella pneumoniae DETECTED (A) NOT DETECTED Final    Comment: CRITICAL RESULT  CALLED TO, READ BACK BY AND VERIFIED WITH: G ABBOTT PHARMD 10/07/19 0419 JDW    Proteus species NOT DETECTED NOT DETECTED Final   Serratia marcescens NOT DETECTED NOT DETECTED Final   Carbapenem resistance NOT DETECTED NOT DETECTED Final   Haemophilus influenzae NOT DETECTED NOT DETECTED Final   Neisseria meningitidis NOT DETECTED NOT DETECTED Final   Pseudomonas aeruginosa NOT DETECTED NOT DETECTED Final   Candida albicans NOT DETECTED NOT DETECTED Final   Candida glabrata NOT DETECTED NOT DETECTED Final   Candida krusei NOT DETECTED NOT DETECTED  Final   Candida parapsilosis NOT DETECTED NOT DETECTED Final   Candida tropicalis NOT DETECTED NOT DETECTED Final    Comment: Performed at [facility], [address] [place], [place] [address]  Blood Culture (routine x 2)     Status: Abnormal   Collection Time: 10/06/19 12:45 PM   Specimen: BLOOD RIGHT HAND  Result Value Ref Range Status   Specimen Description BLOOD RIGHT HAND  Final   Special Requests   Final    BOTTLES DRAWN AEROBIC AND ANAEROBIC Blood Culture results may not be optimal due to an inadequate volume of blood received in culture bottles   Culture  Setup Time   Final    IN BOTH AEROBIC AND ANAEROBIC BOTTLES GRAM NEGATIVE RODS CRITICAL VALUE NOTED.  VALUE IS CONSISTENT WITH PREVIOUSLY REPORTED AND CALLED VALUE.    Culture (A)  Final    KLEBSIELLA PNEUMONIAE SUSCEPTIBILITIES PERFORMED ON PREVIOUS CULTURE WITHIN THE LAST 5 DAYS. Performed at [facility], [address] [place], [place] [address]    Report Status 10/09/2019 FINAL  Final  SARS CORONAVIRUS 2 (TAT 6-24 HRS) Nasopharyngeal Nasopharyngeal Swab     Status: None   Collection Time: 10/06/19  3:35 PM   Specimen: Nasopharyngeal Swab  Result Value Ref Range Status   SARS Coronavirus 2 NEGATIVE NEGATIVE Final    Comment: (NOTE) SARS-CoV-2 target nucleic acids are NOT DETECTED. The SARS-CoV-2 RNA is generally detectable in upper and lower respiratory  specimens during the acute phase of infection. Negative results do not preclude SARS-CoV-2 infection, do not rule out co-infections with other pathogens, and should not be used as the sole basis for treatment or other patient management decisions. Negative results must be combined with clinical observations, patient history, and epidemiological information. The expected result is Negative. Fact Sheet for Patients: HairSlick.nohttps://www.fda.gov/media/138098/download Fact Sheet for Healthcare Providers: quierodirigir.comhttps://www.fda.gov/media/138095/download This test is not yet approved or cleared by the [place] FDA and  has been authorized for detection and/or diagnosis of SARS-CoV-2 by FDA under an Emergency Use Authorization (EUA). This EUA will remain  in effect (meaning this test can be used) for the duration of the COVID-19 declaration under Section 56 4(b)(1) of the Act, 21 U.S.C. section 360bbb-3(b)(1), unless the authorization is terminated or revoked sooner. Performed at [facility], [address] [place], [place] [address]   Urine culture     Status: Abnormal   Collection Time: 10/06/19  4:02 PM   Specimen: In/Out Cath Urine  Result Value Ref Range Status   Specimen Description IN/OUT CATH URINE  Final   Special Requests   Final    NONE Performed at [facility], [address] [place], [place] [address]    Culture (A)  Final    30,000 COLONIES/mL ESCHERICHIA COLI Confirmed Extended Spectrum Beta-Lactamase Producer (ESBL).  In bloodstream infections from ESBL organisms, carbapenems are preferred over piperacillin/tazobactam. They are shown to have a lower risk of mortality. 10,000 COLONIES/mL STAPHYLOCOCCUS EPIDERMIDIS    Report Status 10/08/2019 FINAL  Final   Organism ID, Bacteria ESCHERICHIA COLI (A)  Final   Organism ID, Bacteria STAPHYLOCOCCUS EPIDERMIDIS (A)  Final      Susceptibility   Escherichia coli - MIC*    AMPICILLIN >=32 RESISTANT Resistant      CEFAZOLIN >=64 RESISTANT Resistant     CEFTRIAXONE >=64 RESISTANT Resistant     CIPROFLOXACIN >=4 RESISTANT Resistant     GENTAMICIN <=1 SENSITIVE Sensitive     IMIPENEM <=  0.25 SENSITIVE Sensitive     NITROFURANTOIN <=16 SENSITIVE Sensitive     TRIMETH/SULFA <=20 SENSITIVE Sensitive     AMPICILLIN/SULBACTAM 4 SENSITIVE Sensitive     PIP/TAZO <=4 SENSITIVE Sensitive     Extended ESBL POSITIVE Resistant     * 30,000 COLONIES/mL ESCHERICHIA COLI   Staphylococcus epidermidis - MIC*    CIPROFLOXACIN 2 INTERMEDIATE Intermediate     GENTAMICIN <=0.5 SENSITIVE Sensitive     NITROFURANTOIN <=16 SENSITIVE Sensitive     OXACILLIN >=4 RESISTANT Resistant     TETRACYCLINE <=1 SENSITIVE Sensitive     VANCOMYCIN 1 SENSITIVE Sensitive     TRIMETH/SULFA <=10 SENSITIVE Sensitive     CLINDAMYCIN <=0.25 SENSITIVE Sensitive     RIFAMPIN <=0.5 SENSITIVE Sensitive     Inducible Clindamycin NEGATIVE Sensitive     * 10,000 COLONIES/mL STAPHYLOCOCCUS EPIDERMIDIS  MRSA PCR Screening     Status: None   Collection Time: 10/08/19 10:50 PM   Specimen: Nasal Mucosa; Nasopharyngeal  Result Value Ref Range Status   MRSA by PCR NEGATIVE NEGATIVE Final    Comment:        The GeneXpert MRSA Assay (FDA approved for NASAL specimens only), is one component of a comprehensive MRSA colonization surveillance program. It is not intended to diagnose MRSA infection nor to guide or monitor treatment for MRSA infections. Performed at [facility], [address], [place], [place] [address]   Body fluid culture     Status: None   Collection Time: 10/10/19 12:59 PM   Specimen: A: Abdomen; Peritoneal Fluid   B: Abdomen; Peritoneal Fluid  Result Value Ref Range Status   Specimen Description ABDOMEN  Final   Special Requests NONE  Final   Gram Stain   Final    FEW WBC PRESENT,BOTH PMN AND MONONUCLEAR NO ORGANISMS SEEN    Culture   Final    NO GROWTH Performed at [facility], [address]  [place], [place] [address]    Report Status 10/13/2019 FINAL  Final  Aerobic/Anaerobic Culture (surgical/deep wound)     Status: None (Preliminary result)   Collection Time: 10/10/19  2:26 PM   Specimen: Abscess  Result Value Ref Range Status   Specimen Description ABSCESS LIVER  Final   Special Requests NONE  Final   Gram Stain   Final    ABUNDANT WBC PRESENT,BOTH PMN AND MONONUCLEAR FEW GRAM VARIABLE ROD RARE GRAM NEGATIVE RODS Performed at [facility], [address], [place], [place] [address]    Culture   Final    MODERATE KLEBSIELLA PNEUMONIAE NO ANAEROBES ISOLATED; CULTURE IN PROGRESS FOR 5 DAYS    Report Status PENDING  Incomplete   Organism ID, Bacteria KLEBSIELLA PNEUMONIAE  Final      Susceptibility   Klebsiella pneumoniae - MIC*    AMPICILLIN >=32 RESISTANT Resistant     CEFAZOLIN <=4 SENSITIVE Sensitive     CEFEPIME <=1 SENSITIVE Sensitive     CEFTAZIDIME <=1 SENSITIVE Sensitive     CEFTRIAXONE <=1 SENSITIVE Sensitive     CIPROFLOXACIN <=0.25 SENSITIVE Sensitive     GENTAMICIN <=1 SENSITIVE Sensitive     IMIPENEM <=0.25 SENSITIVE Sensitive     TRIMETH/SULFA <=20 SENSITIVE Sensitive     AMPICILLIN/SULBACTAM 4 SENSITIVE Sensitive     PIP/TAZO <=4 SENSITIVE Sensitive     Extended ESBL NEGATIVE Sensitive     * MODERATE KLEBSIELLA PNEUMONIAE  Culture, blood (routine x 2)     Status: None (Preliminary result)  Collection Time: 10/11/19  5:05 PM   Specimen: BLOOD RIGHT ARM  Result Value Ref Range Status   Specimen Description BLOOD RIGHT ARM  Final   Special Requests AEROBIC BOTTLE ONLY Blood Culture adequate volume  Final   Culture   Final    NO GROWTH 2 DAYS Performed at [facility], [address], [place], [place] [address]    Report Status PENDING  Incomplete  Culture, blood (routine x 2)     Status: None (Preliminary result)   Collection Time: 10/11/19  5:05 PM   Specimen: BLOOD RIGHT ARM  Result Value Ref Range Status   Specimen Description  BLOOD RIGHT ARM  Final   Special Requests AEROBIC BOTTLE ONLY Blood Culture adequate volume  Final   Culture   Final    NO GROWTH 2 DAYS Performed at [facility], [place] [address] [place], [place] [address]    Report Status PENDING  Incomplete    Michel Bickers, MD [facility] [place] [phone] pager   [phone] cell 10/13/2019, 5:35 PM

## 2019-10-13 NOTE — Progress Notes (Signed)
PROGRESS NOTE    Courtney [name]  ZOX:[id number] DOB: [birth year] DOA: 10/06/2019 PCP: Patient, No Pcp Per   Brief Narrative:  73 year old female with past medical history significant for type 2 diabetes, atrial fibrillation, tachybradycardia syndrome who was brought into the emergency department with chief complaint of altered mental status. In the emergency department, there was concern for meningitis and patient was started on vancomycin, ampicillin, ceftriaxone. LP not pursued in the emergency department as patient was on Eliquis and was unclear when her last dose was. She was also found in DKA and was started on IV insulin. During her hospitalization, she was found to have Klebsiella bacteremia, ESBL UTI, multifocal pneumonia. Her mentation continued to improve and as she had source for her sepsis and altered mental status, LP was canceled. On 10/26, rapid response was called due to acute respiratory distress. Patient was transferred to the ICU with PCCM consulted. Due to concern for cholangitis, GI, IR, general surgery consulted as well.No evidence of acute cholecystitis on HIDA scan. No surgery planned. MRI was not possible due to pacemaker. She is status post paracentesis and hepatic abscess aspiration by interventional radiology.   TEE on 10/30 consistent with vegetations.   Assessment & Plan:   Principal Problem:   Endocarditis Active Problems:   Type 2 NIDDM-untreated   Chronic obstructive pulmonary disease (HCC)   Paroxysmal atrial fibrillation (HCC)   Altered mental status   AMS (altered mental status)   Acute metabolic encephalopathy -Resolved.  Back to baseline.  Evaluated with the use of interpreter service.  Severe sepsis secondary to Klebsiella bacteremia, ESBL UTI, multifocal pneumonia, concern for cholangitis, liver abscesses  Valvular endocarditis Hepatic abscess status post drain.  10/28 -Initial concerns of meningitis but LP was held off as patient was  Eliquis.  Eventually her mental status improved and the source was identified.  CT abdomen pelvis showed concerns for cholangitis.  Unable to get MRCP due to incompatible pacemaker leads.  Seen by gastroenterology, general surgery. -Liver abscess drained by IR.  HIDA scan negative for bursitis -Infectious disease following.  CT surgery consulted by their service. -Plan on PICC line placement once bacteremia has stabilized Reconsider CT abdomen pelvis with contrast to evaluate post aspiration on Sunday per previous GI note. -Rocephin 2 g every 24 hours -LR 125 cc/h  Hepatitis  -Hepatitis panel reviewed.  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  Paroxysmal atrial fibrillation with RVR -Patient was started on Cardizem drip at the time of admission -Currently in normal sinus rhythm with rate control  Tachybradycardia syndrome -Pacemaker placed 2018 -Continue flecainide, metoprolol  DKA, resolved Insulin-dependent diabetes mellitus type 2, uncontrolled.  Hyperglycemia. -Hemoglobin A1c 16.4 -Lantus 10 units daily.  Insulin sliding scale and Accu-Chek.  Hypokalemia -Replete as necessary  DVT prophylaxis: SCDs Code Status: Full code Family Communication: None Disposition Plan: Maintain hospital stay for CT surgery evaluation a long-term plan for her endocarditis.  Subjective: Seen patient with the help of interpreter service ID number [id number], [place]  Patient does not have any complaints.  She was curious about what was going on during her hospitalization.  I explained her hospital course up until today and the ongoing plan.  She understands this.  Review of Systems Otherwise negative except as per HPI, including: General: Denies fever, chills, night sweats or unintended weight loss. Resp: Denies cough, wheezing, shortness of breath. Cardiac: Denies chest pain, palpitations, orthopnea, paroxysmal nocturnal dyspnea. GI: Denies abdominal pain, nausea,  vomiting, diarrhea or constipation GU:  Denies dysuria, frequency, hesitancy or incontinence MS: Denies muscle aches, joint pain or swelling Neuro: Denies headache, neurologic deficits (focal weakness, numbness, tingling), abnormal gait Psych: Denies anxiety, depression, SI/HI/AVH Skin: Denies new rashes or lesions ID: Denies sick contacts, exotic exposures, travel  Objective: Vitals:   10/13/19 0031 10/13/19 0500 10/13/19 0640 10/13/19 0823  BP: (!) 105/51 (!) 91/58 (!) 102/40 (!) 110/45  Pulse:  64 68 73  Resp: (!) 22 (!) 21 (!) 21 20  Temp: 97.6 F (36.4 C) 99.1 F (37.3 C) 98.5 F (36.9 C) 99.1 F (37.3 C)  TempSrc: Oral Oral Oral Oral  SpO2: 95% 96% 94%   Weight:      Height:        Intake/Output Summary (Last 24 hours) at 10/13/2019 1141 Last data filed at 10/13/2019 0256 Gross per 24 hour  Intake 340 ml  Output -  Net 340 ml   Filed Weights   10/07/19 0142 10/08/19 0641 10/12/19 1303  Weight: 45.2 kg 47.7 kg 47.7 kg    Examination:  General exam: Appears calm and comfortable  Respiratory system: Very minimal bibasilar rhonchi Cardiovascular system: S1 & S2 heard, RRR. No JVD, murmurs, rubs, gallops or clicks. No pedal edema. Gastrointestinal system: Abdomen is nondistended, soft and nontender. No organomegaly or masses felt. Normal bowel sounds heard. Central nervous system: Alert and oriented. No focal neurological deficits. Extremities: Symmetric 5 x 5 power. Skin: No rashes, lesions or ulcers Psychiatry: Judgement and insight appear normal. Mood & affect appropriate.   Right upper extremity PICC line in place. 10/12/19  Data Reviewed:   CBC: Recent Labs  Lab 10/06/19 1240  10/09/19 0459 10/10/19 0419 10/11/19 0233 10/12/19 0235 10/13/19 0314  WBC 14.9*   < > 12.9* 20.6* 20.8* 22.2* 20.4*  NEUTROABS 12.1*  --   --   --   --   --   --   HGB 11.9*   < > 10.5* 13.0 11.9* 10.7* 10.4*  HCT 37.1   < > 29.7* 36.2 34.1* 31.3* 30.9*  MCV 97.6   < > 88.1  87.2 88.3 90.2 91.4  PLT 101*   < > 33* 60* 98* 139* 209   < > = values in this interval not displayed.   Basic Metabolic Panel: Recent Labs  Lab 10/07/19 0429 10/08/19 0531 10/08/19 1603 10/09/19 0459 10/10/19 0419 10/11/19 0233 10/11/19 1645 10/12/19 0235 10/13/19 0314  NA 137 134* 135 137 137 138 139 140 136  K 2.1* 3.4* 3.7 4.0 4.5 3.2* 3.7 3.7 3.4*  CL 110 109 111 115* 112* 112* 110 111 103  CO2 15* 14* 12* 15* 15* 18* 21* 21* 21*  GLUCOSE 95 162* 205* 129* 118* 80 129* 128* 169*  BUN 12 24* 23 25* 30* 23 15 9  6*  CREATININE 0.73 0.76 0.83 0.68 0.72 0.82 0.73 0.61 0.61  CALCIUM 8.0* 7.8* 7.9* 7.8* 7.8* 7.4* 7.6* 7.5* 7.6*  MG 1.6* 2.1 1.8 1.9  --   --  1.7  --   --    GFR: Estimated Creatinine Clearance: 23.4 mL/min (by C-G formula based on SCr of 0.61 mg/dL). Liver Function Tests: Recent Labs  Lab 10/09/19 0459 10/10/19 0419 10/11/19 0233 10/12/19 0235 10/13/19 0314  AST 995* 293* 273* 267* 180*  ALT 794* 481* 370* 334* 285*  ALKPHOS 109 114 122 123 111  BILITOT 1.2 1.2 1.0 0.9 1.1  PROT 5.0* 4.6* 4.4* 4.3* 5.1*  ALBUMIN 1.8* 1.5* 1.5* 1.6* 1.6*   No results for  input(s): LIPASE, AMYLASE in the last 168 hours. No results for input(s): AMMONIA in the last 168 hours. Coagulation Profile: Recent Labs  Lab 10/06/19 1240 10/08/19 1215 10/09/19 1102  INR 1.2 1.3* 1.3*   Cardiac Enzymes: Recent Labs  Lab 10/09/19 0633  CKTOTAL 41   BNP (last 3 results) No results for input(s): PROBNP in the last 8760 hours. HbA1C: No results for input(s): HGBA1C in the last 72 hours. CBG: Recent Labs  Lab 10/12/19 1153 10/12/19 1708 10/12/19 2038 10/13/19 0556 10/13/19 1102  GLUCAP 112* 101* 193* 163* 239*   Lipid Profile: No results for input(s): CHOL, HDL, LDLCALC, TRIG, CHOLHDL, LDLDIRECT in the last 72 hours. Thyroid Function Tests: No results for input(s): TSH, T4TOTAL, FREET4, T3FREE, THYROIDAB in the last 72 hours. Anemia Panel: No results for  input(s): VITAMINB12, FOLATE, FERRITIN, TIBC, IRON, RETICCTPCT in the last 72 hours. Sepsis Labs: Recent Labs  Lab 10/07/19 1039 10/08/19 1611 10/08/19 2236 10/09/19 1102  LATICACIDVEN 1.5 4.3* 2.7* 1.7    Recent Results (from the past 240 hour(s))  Blood Culture (routine x 2)     Status: Abnormal   Collection Time: 10/06/19 12:40 PM   Specimen: BLOOD  Result Value Ref Range Status   Specimen Description BLOOD RIGHT ANTECUBITAL  Final   Special Requests   Final    BOTTLES DRAWN AEROBIC AND ANAEROBIC Blood Culture results may not be optimal due to an inadequate volume of blood received in culture bottles   Culture  Setup Time   Final    IN BOTH AEROBIC AND ANAEROBIC BOTTLES GRAM NEGATIVE RODS CRITICAL RESULT CALLED TO, READ BACK BY AND VERIFIED WITH: Denton Brick [facility] 10/07/19 0419 JDW Performed at [facility], [place] [address] [place], [place] [address]    Culture KLEBSIELLA PNEUMONIAE (A)  Final   Report Status 10/09/2019 FINAL  Final   Organism ID, Bacteria KLEBSIELLA PNEUMONIAE  Final      Susceptibility   Klebsiella pneumoniae - MIC*    AMPICILLIN >=32 RESISTANT Resistant     CEFAZOLIN <=4 SENSITIVE Sensitive     CEFEPIME <=1 SENSITIVE Sensitive     CEFTAZIDIME <=1 SENSITIVE Sensitive     CEFTRIAXONE <=1 SENSITIVE Sensitive     CIPROFLOXACIN <=0.25 SENSITIVE Sensitive     GENTAMICIN <=1 SENSITIVE Sensitive     IMIPENEM 0.5 SENSITIVE Sensitive     TRIMETH/SULFA <=20 SENSITIVE Sensitive     AMPICILLIN/SULBACTAM 8 SENSITIVE Sensitive     PIP/TAZO <=4 SENSITIVE Sensitive     Extended ESBL NEGATIVE Sensitive     * KLEBSIELLA PNEUMONIAE  Blood Culture ID Panel (Reflexed)     Status: Abnormal   Collection Time: 10/06/19 12:40 PM  Result Value Ref Range Status   Enterococcus species NOT DETECTED NOT DETECTED Final   Listeria monocytogenes NOT DETECTED NOT DETECTED Final   Staphylococcus species NOT DETECTED NOT DETECTED Final   Staphylococcus aureus (BCID) NOT DETECTED  NOT DETECTED Final   Streptococcus species NOT DETECTED NOT DETECTED Final   Streptococcus agalactiae NOT DETECTED NOT DETECTED Final   Streptococcus pneumoniae NOT DETECTED NOT DETECTED Final   Streptococcus pyogenes NOT DETECTED NOT DETECTED Final   Acinetobacter baumannii NOT DETECTED NOT DETECTED Final   Enterobacteriaceae species DETECTED (A) NOT DETECTED Final    Comment: Enterobacteriaceae represent a large family of gram-negative bacteria, not a single organism. CRITICAL RESULT CALLED TO, READ BACK BY AND VERIFIED WITH: G ABBOTT PHARMD 10/07/19 0419 JDW    Enterobacter cloacae complex NOT DETECTED NOT  DETECTED Final   Escherichia coli NOT DETECTED NOT DETECTED Final   Klebsiella oxytoca NOT DETECTED NOT DETECTED Final   Klebsiella pneumoniae DETECTED (A) NOT DETECTED Final    Comment: CRITICAL RESULT CALLED TO, READ BACK BY AND VERIFIED WITH: G ABBOTT PHARMD 10/07/19 0419 JDW    Proteus species NOT DETECTED NOT DETECTED Final   Serratia marcescens NOT DETECTED NOT DETECTED Final   Carbapenem resistance NOT DETECTED NOT DETECTED Final   Haemophilus influenzae NOT DETECTED NOT DETECTED Final   Neisseria meningitidis NOT DETECTED NOT DETECTED Final   Pseudomonas aeruginosa NOT DETECTED NOT DETECTED Final   Candida albicans NOT DETECTED NOT DETECTED Final   Candida glabrata NOT DETECTED NOT DETECTED Final   Candida krusei NOT DETECTED NOT DETECTED Final   Candida parapsilosis NOT DETECTED NOT DETECTED Final   Candida tropicalis NOT DETECTED NOT DETECTED Final    Comment: Performed at [facility], [address] [place], [place] [address]  Blood Culture (routine x 2)     Status: Abnormal   Collection Time: 10/06/19 12:45 PM   Specimen: BLOOD RIGHT HAND  Result Value Ref Range Status   Specimen Description BLOOD RIGHT HAND  Final   Special Requests   Final    BOTTLES DRAWN AEROBIC AND ANAEROBIC Blood Culture results may not be optimal due to an inadequate volume of blood  received in culture bottles   Culture  Setup Time   Final    IN BOTH AEROBIC AND ANAEROBIC BOTTLES GRAM NEGATIVE RODS CRITICAL VALUE NOTED.  VALUE IS CONSISTENT WITH PREVIOUSLY REPORTED AND CALLED VALUE.    Culture (A)  Final    KLEBSIELLA PNEUMONIAE SUSCEPTIBILITIES PERFORMED ON PREVIOUS CULTURE WITHIN THE LAST 5 DAYS. Performed at [facility], [address] [place], [place] [address]    Report Status 10/09/2019 FINAL  Final  SARS CORONAVIRUS 2 (TAT 6-24 HRS) Nasopharyngeal Nasopharyngeal Swab     Status: None   Collection Time: 10/06/19  3:35 PM   Specimen: Nasopharyngeal Swab  Result Value Ref Range Status   SARS Coronavirus 2 NEGATIVE NEGATIVE Final    Comment: (NOTE) SARS-CoV-2 target nucleic acids are NOT DETECTED. The SARS-CoV-2 RNA is generally detectable in upper and lower respiratory specimens during the acute phase of infection. Negative results do not preclude SARS-CoV-2 infection, do not rule out co-infections with other pathogens, and should not be used as the sole basis for treatment or other patient management decisions. Negative results must be combined with clinical observations, patient history, and epidemiological information. The expected result is Negative. Fact Sheet for Patients: HairSlick.no Fact Sheet for Healthcare Providers: quierodirigir.com This test is not yet approved or cleared by the [place] FDA and  has been authorized for detection and/or diagnosis of SARS-CoV-2 by FDA under an Emergency Use Authorization (EUA). This EUA will remain  in effect (meaning this test can be used) for the duration of the COVID-19 declaration under Section 56 4(b)(1) of the Act, 21 U.S.C. section 360bbb-3(b)(1), unless the authorization is terminated or revoked sooner. Performed at [facility], [address] [place], [place] [address]   Urine culture     Status: Abnormal   Collection  Time: 10/06/19  4:02 PM   Specimen: In/Out Cath Urine  Result Value Ref Range Status   Specimen Description IN/OUT CATH URINE  Final   Special Requests   Final    NONE Performed at [facility], [address], [place], [place] [address]  Culture (A)  Final    30,000 COLONIES/mL ESCHERICHIA COLI Confirmed Extended Spectrum Beta-Lactamase Producer (ESBL).  In bloodstream infections from ESBL organisms, carbapenems are preferred over piperacillin/tazobactam. They are shown to have a lower risk of mortality. 10,000 COLONIES/mL STAPHYLOCOCCUS EPIDERMIDIS    Report Status 10/08/2019 FINAL  Final   Organism ID, Bacteria ESCHERICHIA COLI (A)  Final   Organism ID, Bacteria STAPHYLOCOCCUS EPIDERMIDIS (A)  Final      Susceptibility   Escherichia coli - MIC*    AMPICILLIN >=32 RESISTANT Resistant     CEFAZOLIN >=64 RESISTANT Resistant     CEFTRIAXONE >=64 RESISTANT Resistant     CIPROFLOXACIN >=4 RESISTANT Resistant     GENTAMICIN <=1 SENSITIVE Sensitive     IMIPENEM <=0.25 SENSITIVE Sensitive     NITROFURANTOIN <=16 SENSITIVE Sensitive     TRIMETH/SULFA <=20 SENSITIVE Sensitive     AMPICILLIN/SULBACTAM 4 SENSITIVE Sensitive     PIP/TAZO <=4 SENSITIVE Sensitive     Extended ESBL POSITIVE Resistant     * 30,000 COLONIES/mL ESCHERICHIA COLI   Staphylococcus epidermidis - MIC*    CIPROFLOXACIN 2 INTERMEDIATE Intermediate     GENTAMICIN <=0.5 SENSITIVE Sensitive     NITROFURANTOIN <=16 SENSITIVE Sensitive     OXACILLIN >=4 RESISTANT Resistant     TETRACYCLINE <=1 SENSITIVE Sensitive     VANCOMYCIN 1 SENSITIVE Sensitive     TRIMETH/SULFA <=10 SENSITIVE Sensitive     CLINDAMYCIN <=0.25 SENSITIVE Sensitive     RIFAMPIN <=0.5 SENSITIVE Sensitive     Inducible Clindamycin NEGATIVE Sensitive     * 10,000 COLONIES/mL STAPHYLOCOCCUS EPIDERMIDIS  MRSA PCR Screening     Status: None   Collection Time: 10/08/19 10:50 PM   Specimen: Nasal Mucosa; Nasopharyngeal  Result Value Ref Range  Status   MRSA by PCR NEGATIVE NEGATIVE Final    Comment:        The GeneXpert MRSA Assay (FDA approved for NASAL specimens only), is one component of a comprehensive MRSA colonization surveillance program. It is not intended to diagnose MRSA infection nor to guide or monitor treatment for MRSA infections. Performed at [facility], [address], [place], [place] [address]   Body fluid culture     Status: None   Collection Time: 10/10/19 12:59 PM   Specimen: A: Abdomen; Peritoneal Fluid   B: Abdomen; Peritoneal Fluid  Result Value Ref Range Status   Specimen Description ABDOMEN  Final   Special Requests NONE  Final   Gram Stain   Final    FEW WBC PRESENT,BOTH PMN AND MONONUCLEAR NO ORGANISMS SEEN    Culture   Final    NO GROWTH Performed at [facility], [address] [place], [place] [address]    Report Status 10/13/2019 FINAL  Final  Aerobic/Anaerobic Culture (surgical/deep wound)     Status: None (Preliminary result)   Collection Time: 10/10/19  2:26 PM   Specimen: Abscess  Result Value Ref Range Status   Specimen Description ABSCESS LIVER  Final   Special Requests NONE  Final   Gram Stain   Final    ABUNDANT WBC PRESENT,BOTH PMN AND MONONUCLEAR FEW GRAM VARIABLE ROD RARE GRAM NEGATIVE RODS Performed at [facility], [address] [place], [place] [address]    Culture MODERATE KLEBSIELLA PNEUMONIAE  Final   Report Status PENDING  Incomplete   Organism ID, Bacteria KLEBSIELLA PNEUMONIAE  Final      Susceptibility   Klebsiella pneumoniae - MIC*  AMPICILLIN >=32 RESISTANT Resistant     CEFAZOLIN <=4 SENSITIVE Sensitive     CEFEPIME <=1 SENSITIVE Sensitive     CEFTAZIDIME <=1 SENSITIVE Sensitive     CEFTRIAXONE <=1 SENSITIVE Sensitive     CIPROFLOXACIN <=0.25 SENSITIVE Sensitive     GENTAMICIN <=1 SENSITIVE Sensitive     IMIPENEM <=0.25 SENSITIVE Sensitive     TRIMETH/SULFA <=20 SENSITIVE Sensitive     AMPICILLIN/SULBACTAM 4 SENSITIVE  Sensitive     PIP/TAZO <=4 SENSITIVE Sensitive     Extended ESBL NEGATIVE Sensitive     * MODERATE KLEBSIELLA PNEUMONIAE  Culture, blood (routine x 2)     Status: None (Preliminary result)   Collection Time: 10/11/19  5:05 PM   Specimen: BLOOD RIGHT ARM  Result Value Ref Range Status   Specimen Description BLOOD RIGHT ARM  Final   Special Requests AEROBIC BOTTLE ONLY Blood Culture adequate volume  Final   Culture   Final    NO GROWTH 2 DAYS Performed at [facility], [address] [place], [place] [address]    Report Status PENDING  Incomplete  Culture, blood (routine x 2)     Status: None (Preliminary result)   Collection Time: 10/11/19  5:05 PM   Specimen: BLOOD RIGHT ARM  Result Value Ref Range Status   Specimen Description BLOOD RIGHT ARM  Final   Special Requests AEROBIC BOTTLE ONLY Blood Culture adequate volume  Final   Culture   Final    NO GROWTH 2 DAYS Performed at [facility], [address], [place], [place] [address]    Report Status PENDING  Incomplete         Radiology Studies: [place] Ekg Site Rite  Result Date: 10/12/2019 If Site Rite image not attached, placement could not be confirmed due to current cardiac rhythm.       Scheduled Meds: . chlorhexidine  15 mL Mouth Rinse BID  . Chlorhexidine Gluconate Cloth  6 each Topical Daily  . Chlorhexidine Gluconate Cloth  6 each Topical Q0600  . flecainide  50 mg Oral BID  . insulin aspart  0-9 Units Subcutaneous TID WC  . insulin glargine  10 Units Subcutaneous Daily  . mouth rinse  15 mL Mouth Rinse q12n4p  . pantoprazole (PROTONIX) IV  40 mg Intravenous Q24H  . sodium chloride flush  10-40 mL Intracatheter Q12H   Continuous Infusions: . sodium chloride Stopped (10/09/19 0959)  . cefTRIAXone (ROCEPHIN)  IV 2 g (10/13/19 0036)  . lactated ringers 125 mL/hr at 10/12/19 0523  . potassium chloride 10 mEq (10/13/19 1130)     LOS: 7 days   Time spent= 35 mins    Ankit Joline Maxcy, MD  [facility]  If 7PM-7AM, please contact night-coverage  10/13/2019, 11:41 AM

## 2019-10-14 ENCOUNTER — Encounter (HOSPITAL_COMMUNITY): Payer: Self-pay | Admitting: Cardiology

## 2019-10-14 ENCOUNTER — Inpatient Hospital Stay (HOSPITAL_COMMUNITY): Payer: Medicare HMO

## 2019-10-14 LAB — CBC
HCT: 27.4 % — ABNORMAL LOW (ref 36.0–46.0)
Hemoglobin: 9.2 g/dL — ABNORMAL LOW (ref 12.0–15.0)
MCH: 31.1 pg (ref 26.0–34.0)
MCHC: 33.6 g/dL (ref 30.0–36.0)
MCV: 92.6 fL (ref 80.0–100.0)
Platelets: 267 10*3/uL (ref 150–400)
RBC: 2.96 MIL/uL — ABNORMAL LOW (ref 3.87–5.11)
RDW: 13.3 % (ref 11.5–15.5)
WBC: 17.7 10*3/uL — ABNORMAL HIGH (ref 4.0–10.5)
nRBC: 0 % (ref 0.0–0.2)

## 2019-10-14 LAB — COMPREHENSIVE METABOLIC PANEL
ALT: 193 U/L — ABNORMAL HIGH (ref 0–44)
AST: 85 U/L — ABNORMAL HIGH (ref 15–41)
Albumin: 1.6 g/dL — ABNORMAL LOW (ref 3.5–5.0)
Alkaline Phosphatase: 103 U/L (ref 38–126)
Anion gap: 6 (ref 5–15)
BUN: 5 mg/dL — ABNORMAL LOW (ref 8–23)
CO2: 24 mmol/L (ref 22–32)
Calcium: 7.5 mg/dL — ABNORMAL LOW (ref 8.9–10.3)
Chloride: 105 mmol/L (ref 98–111)
Creatinine, Ser: 0.54 mg/dL (ref 0.44–1.00)
GFR calc Af Amer: >60 mL/min (ref >60)
GFR calc non Af Amer: >60 mL/min (ref >60)
Glucose, Bld: 155 mg/dL — ABNORMAL HIGH (ref 70–99)
Potassium: 3.6 mmol/L (ref 3.5–5.1)
Sodium: 135 mmol/L (ref 135–145)
Total Bilirubin: 0.9 mg/dL (ref 0.3–1.2)
Total Protein: 5 g/dL — ABNORMAL LOW (ref 6.5–8.1)

## 2019-10-14 LAB — GLUCOSE, CAPILLARY
Glucose-Capillary: 184 mg/dL — ABNORMAL HIGH (ref 70–99)
Glucose-Capillary: 232 mg/dL — ABNORMAL HIGH (ref 70–99)
Glucose-Capillary: 252 mg/dL — ABNORMAL HIGH (ref 70–99)
Glucose-Capillary: 168 mg/dL — ABNORMAL HIGH (ref 70–99)

## 2019-10-14 LAB — MAGNESIUM: Magnesium: 1.6 mg/dL — ABNORMAL LOW (ref 1.7–2.4)

## 2019-10-14 MED ORDER — IOHEXOL 300 MG/ML  SOLN
100.0000 mL | Freq: Once | INTRAMUSCULAR | Status: AC | PRN
  Administered 2019-10-14: 100 mL via INTRAVENOUS

## 2019-10-14 MED ORDER — INSULIN ASPART 100 UNIT/ML ~~LOC~~ SOLN
0.0000 [IU] | Freq: Three times a day (TID) | SUBCUTANEOUS | Status: DC
  Administered 2019-10-14: 17:00:00 8 [IU] via SUBCUTANEOUS
  Administered 2019-10-14: 12:00:00 5 [IU] via SUBCUTANEOUS
  Administered 2019-10-15: 2 [IU] via SUBCUTANEOUS
  Administered 2019-10-16: 06:00:00 3 [IU] via SUBCUTANEOUS
  Administered 2019-10-16: 11 [IU] via SUBCUTANEOUS
  Administered 2019-10-16: 8 [IU] via SUBCUTANEOUS
  Administered 2019-10-17: 5 [IU] via SUBCUTANEOUS
  Administered 2019-10-17: 11 [IU] via SUBCUTANEOUS
  Administered 2019-10-17 – 2019-10-18 (×2): 2 [IU] via SUBCUTANEOUS

## 2019-10-14 MED ORDER — METOPROLOL TARTRATE 50 MG PO TABS: 50.0000 mg | ORAL_TABLET | Freq: Two times a day (BID) | ORAL | Status: DC

## 2019-10-14 MED ORDER — INSULIN GLARGINE 100 UNIT/ML ~~LOC~~ SOLN
12.0000 [IU] | Freq: Every day | SUBCUTANEOUS | Status: DC
  Administered 2019-10-16: 10:00:00 12 [IU] via SUBCUTANEOUS
  Filled 2019-10-14 (×2): qty 0.12

## 2019-10-14 MED ORDER — PANTOPRAZOLE SODIUM 40 MG PO TBEC: 40.0000 mg | DELAYED_RELEASE_TABLET | Freq: Every day | ORAL | Status: DC

## 2019-10-14 MED ORDER — ENOXAPARIN SODIUM 30 MG/0.3ML ~~LOC~~ SOLN: 30.0000 mg | SUBCUTANEOUS | Status: DC

## 2019-10-14 MED ORDER — INSULIN ASPART 100 UNIT/ML ~~LOC~~ SOLN
0.0000 [IU] | Freq: Every day | SUBCUTANEOUS | Status: DC
  Administered 2019-10-15: 21:00:00 2 [IU] via SUBCUTANEOUS
  Administered 2019-10-17: 4 [IU] via SUBCUTANEOUS

## 2019-10-14 NOTE — Progress Notes (Signed)
[facility] Progress Note  CC:  Klebsiella bacteremia/liver abscess   Subjective: Son present to interpret. She is tolerating a solid diet. No N/V. No abdominal pain. She slept well last night. She passed a formed BM this morning. No rectal bleeding or melena.  Son asking if mother will go home today. Repeat abd/pelvic CT was done this am, results pending. Hospital day # 10.   Objective:  Vital signs in last 24 hours: Temp:  [98.4 F (36.9 C)-100 F (37.8 C)] 99.3 F (37.4 C) (11/01 0824) Pulse Rate:  [70-77] 70 (11/01 0824) Resp:  [19-26] 19 (11/01 0824) BP: (97-113)/(47-54) 108/53 (11/01 0824) SpO2:  [96 %-100 %] 96 % (11/01 0621) Last BM Date: 10/12/19 General:   Alert, petite female in NAD. Eyes: no scleral icterus, conjunctiva pink. Heart: RRR, no murmur. Pulm: Breath sounds clear throughout. Abdomen: Soft, nontender, + BS x 4 quads, no HSM. Extremities:  Without edema. Neurologic:  Alert and  oriented x4;  grossly normal neurologically.  Psych:  Alert and cooperative. Normal mood and affect.  Intake/Output from previous day: 10/31 0701 - 11/01 0700 In: 1843.9 [P.O.:480; I.V.:863.9; IV Piggyback:500] Out: 2350 [Urine:2350] Intake/Output this shift: No intake/output data recorded.  Lab Results: Recent Labs    10/12/19 0235 10/13/19 0314 10/14/19 0518  WBC 22.2* 20.4* 17.7*  HGB 10.7* 10.4* 9.2*  HCT 31.3* 30.9* 27.4*  PLT 139* 209 267   BMET Recent Labs    10/12/19 0235 10/13/19 0314 10/14/19 0518  NA 140 136 135  K 3.7 3.4* 3.6  CL 111 103 105  CO2 21* 21* 24  GLUCOSE 128* 169* 155*  BUN 9 6* 5*  CREATININE 0.61 0.61 0.54  CALCIUM 7.5* 7.6* 7.5*   LFT Recent Labs    10/12/19 0235  10/14/19 0518  PROT 4.3*   < > 5.0*  ALBUMIN 1.6*   < > 1.6*  AST 267*   < > 85*  ALT 334*   < > 193*  ALKPHOS 123   < > 103  BILITOT 0.9   < > 0.9  BILIDIR 0.2  --   --   IBILI 0.7  --   --    < > = values in this interval not displayed.    PT/INR No results for input(s): LABPROT, INR in the last 72 hours. Hepatitis Panel No results for input(s): HEPBSAG, HCVAB, HEPAIGM, HEPBIGM in the last 72 hours.  [place] Ekg Site Rite  Result Date: 10/12/2019 If Site Rite image not attached, placement could not be confirmed due to current cardiac rhythm.   Assessment / Plan:  54. 73 year old female with Klebsiella bacteremia/liver abscess and elevated LFTs. CTAP done today to reassess liver abscesses. WBC 17.7 drifting downward. She remains afebrile. LFTS improving. AST 85. ALT 193. Alk phos 103. T. Bili 0.9. Hep B surface antigen negative. Hep B core total reactive (? Past hep B).  Hep A total ab reactive. Hep C antibody nonreactive.  -await results of repeat abd/pelvic CT done this am -ID following, Ceftriaxone 2gm IV daily x 6 weeks, Flagyl '500mg'$  IV Q  8hrs  2. TEE + for vegetation on MV and AV -followed by ID  3. Normocytic anemia, multifactorial, dilutional component.  Hg 9.2 >>10.4 >> 11.9. HCT 27.4. MCV 92.6. No obvious signs of GI bleeding. -further recommendation per Dr. Rush Landmark   4. Hypomagnesia. Magnesium level today  -replacement per hospitalist  5. Hypoalbuminemia. [place] 1.6.  Principal Problem:   Gram-negative bacteremia  Active Problems:   Type 2 NIDDM-untreated   Chronic obstructive pulmonary disease (HCC)   Paroxysmal atrial fibrillation (HCC)   Pacemaker   Altered mental status   Endocarditis   Liver abscess   Klebsiella infection     LOS: 8 days   Noralyn Pick  10/14/2019, 10:02 AM

## 2019-10-14 NOTE — Progress Notes (Signed)
PROGRESS NOTE    Courtney [name]  ZOX:[id number] DOB: 06/14/1946 DOA: 10/06/2019 PCP: Patient, No Pcp Per      Brief Narrative:  Courtney [name] is a 73 y.o. F with DM, Afib and tachy/brady s/p PPM with flecainide/Eliquis who presented with fever, confusion progressing over about 3 days.  In the ER, patient had Glu 369, Bicarb 9, WBC 14.9.  LP could not be obtained but she was started on empiric antibiotics with vancomycin, ceftriaxone and ampicillin to cover meningitis.      Assessment & Plan:  Sepsis from Klebsiella bacteremia resulting in multifocal pneumonia, liver abscess, valvular endocarditis Hepatic abscess, s/p drain 10/28 CT abdomen and pelvis showed cholangitis and liver abscess.  Liver abscess drained by IR, HIDA scan negative for cholecystitis.  ID and CT surgery and EP consulted.  CT surgery recommend no valve repair EP recommend medical therapy and surveillance cultures after rather than lead removal now. -Continue ceftriaxone and Flagyl -CT scan today -PICC in place -Consult ID, GI, appreciate expert insights     Acute metabolic encephalopathy Due to sepsis.  Now resolved  Transaminitis Due to liver abscess.  Hepatitis A antibody reactive, hep B core antibody reactive.  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  Glucoses elevated here. -Continue Lantus, increase dose -Continue SS correction insulin, increase dose  GERD -Stop pantoprazole while on antibiotics  ESBL urine culture ID suspect this is colonization only.  Anemia of chronic disease No clinical signs of bleeding.     MDM and disposition: The below labs and imaging reports were reviewed  and summarized above.  Medication management as above.  The patient was admitted with sepsis from Klebsiella, with pneumonia, abscess and endocarditis.      DVT prophylaxis: Lovenox Code Status: FULL Family Communication: Son at bedisde    Consultants:   GI  ID  CT surgery  EP  Procedures:   10/27 HIDA  10/30 TEE   Antimicrobials:        Subjective: All history collected through interpreter Feeling well.  No headache, arm pain, chest pain, palpitations.  No abdomiinal pain.  Appetite good.  No fever.   Objective: Vitals:   10/13/19 1626 10/13/19 2039 10/14/19 0621 10/14/19 0824  BP: (!) 113/47 (!) 103/51 (!) 113/54 (!) 108/53  Pulse: 76 77 71 70  Resp: (!) 24 (!) 21 (!) 24 19  Temp: 99.3 F (37.4 C) 99.3 F (37.4 C) 98.4 F (36.9 C) 99.3 F (37.4 C)  TempSrc: Oral Oral Oral Oral  SpO2:  100% 96%   Weight:      Height:        Intake/Output Summary (Last 24 hours) at 10/14/2019 1119 Last data filed at 10/14/2019 0600 Gross per 24 hour  Intake 1843.88 ml  Output 2350 ml  Net -506.12 ml   Filed Weights   10/07/19 0142 10/08/19 0641 10/12/19 1303  Weight: 45.2 kg 47.7 kg 47.7 kg    Examination: General appearance:  adult female, alert and in no acute distress.   HEENT: Anicteric, conjunctiva pink, lids and lashes normal. No nasal deformity, discharge, epistaxis.  Lips moist, dentition normal, OP moist nor oral lesions, hearing normal.   Skin: Warm and dry.  no jaundice.  No suspicious rashes  or lesions. Cardiac: RRR, nl S1-S2, no murmurs appreciated.  Capillary refill is brisk.  JVP normal.  No LE edema.  Radia  pulses 2+ and symmetric. Respiratory: Normal respiratory rate and rhythm.  CTAB without rales or wheezes. Abdomen: Abdomen soft.  no TTP. No ascites, distension, hepatosplenomegaly.   MSK: No deformities or effusions. Neuro: Awake and alert.  EOMI, moves all extremities. Speech fluent.    Psych: Sensorium intact and responding to questions,  attention normal. Affect normal.  Judgment and insight appear normal.    Data Reviewed: I have personally reviewed following labs and imaging studies:  CBC: Recent Labs  Lab 10/10/19 0419 10/11/19 0233 10/12/19 0235 10/13/19 0314 10/14/19 0518  WBC 20.6* 20.8* 22.2* 20.4* 17.7*  HGB 13.0 11.9* 10.7* 10.4* 9.2*  HCT 36.2 34.1* 31.3* 30.9* 27.4*  MCV 87.2 88.3 90.2 91.4 92.6  PLT 60* 98* 139* 209 267   Basic Metabolic Panel: Recent Labs  Lab 10/08/19 0531 10/08/19 1603 10/09/19 0459  10/11/19 0233 10/11/19 1645 10/12/19 0235 10/13/19 0314 10/14/19 0518  NA 134* 135 137   < > 138 139 140 136 135  K 3.4* 3.7 4.0   < > 3.2* 3.7 3.7 3.4* 3.6  CL 109 111 115*   < > 112* 110 111 103 105  CO2 14* 12* 15*   < > 18* 21* 21* 21* 24  GLUCOSE 162* 205* 129*   < > 80 129* 128* 169* 155*  BUN 24* 23 25*   < > 6* 5*  CREATININE 0.76 0.83 0.68   < > 0.82 0.73 0.61 0.61 0.54  CALCIUM 7.8* 7.9* 7.8*   < > 7.4* 7.6* 7.5* 7.6* 7.5*  MG 2.1 1.8 1.9  --   --  1.7  --   --  1.6*   < > = values in this interval not displayed.   GFR: Estimated Creatinine Clearance: 23.4 mL/min (by C-G formula based on SCr of 0.54 mg/dL). Liver Function Tests: Recent Labs  Lab 10/10/19 0419 10/11/19 0233 10/12/19 0235 10/13/19 0314 10/14/19 0518  AST 293* 273* 267* 180* 85*  ALT 481* 370* 334* 285* 193*  ALKPHOS 114 122 123 111 103  BILITOT 1.2 1.0 0.9 1.1 0.9  PROT 4.6* 4.4* 4.3* 5.1* 5.0*  ALBUMIN 1.5* 1.5* 1.6* 1.6* 1.6*   No results for input(s): LIPASE, AMYLASE in the last 168 hours. No results for input(s): AMMONIA in the last 168 hours. Coagulation Profile: Recent Labs  Lab 10/08/19 1215 10/09/19 1102  INR 1.3* 1.3*   Cardiac Enzymes: Recent Labs  Lab 10/09/19 0633  CKTOTAL 41   BNP (last 3 results) No results for input(s): PROBNP in the last 8760 hours. HbA1C: No results for input(s): HGBA1C in the last 72 hours. CBG: Recent Labs  Lab 10/13/19 0556 10/13/19 1102  10/13/19 1625 10/13/19 2106 10/14/19 0628  GLUCAP 163* 239* 312* 147* 184*   Lipid Profile: No results for input(s): CHOL, HDL, LDLCALC, TRIG, CHOLHDL, LDLDIRECT in the last 72 hours. Thyroid Function Tests: No results for input(s): TSH, T4TOTAL, FREET4, T3FREE, THYROIDAB in the last 72 hours. Anemia Panel: No results for input(s): VITAMINB12, FOLATE, FERRITIN, TIBC, IRON, RETICCTPCT in the last 72 hours. Urine analysis:    Component Value Date/Time   COLORURINE YELLOW 10/10/2019 0445   APPEARANCEUR CLEAR 10/10/2019 0445   LABSPEC 1.021 10/10/2019 0445   PHURINE 6.0 10/10/2019 0445   GLUCOSEU 50 (A) 10/10/2019 0445   HGBUR NEGATIVE 10/10/2019 0445   BILIRUBINUR NEGATIVE  10/10/2019 0445   BILIRUBINUR neg 05/05/2015 1038   KETONESUR 5 (A) 10/10/2019 0445   PROTEINUR NEGATIVE 10/10/2019 0445   UROBILINOGEN 0.2 05/05/2015 1038   NITRITE NEGATIVE 10/10/2019 0445   LEUKOCYTESUR NEGATIVE 10/10/2019 0445   Sepsis Labs: @LABRCNTIP (procalcitonin:4,lacticacidven:4)  ) Recent Results (from the past 240 hour(s))  Blood Culture (routine x 2)     Status: Abnormal   Collection Time: 10/06/19 12:40 PM   Specimen: BLOOD  Result Value Ref Range Status   Specimen Description BLOOD RIGHT ANTECUBITAL  Final   Special Requests   Final    BOTTLES DRAWN AEROBIC AND ANAEROBIC Blood Culture results may not be optimal due to an inadequate volume of blood received in culture bottles   Culture  Setup Time   Final    IN BOTH AEROBIC AND ANAEROBIC BOTTLES GRAM NEGATIVE RODS CRITICAL RESULT CALLED TO, READ BACK BY AND VERIFIED WITH: 10/08/19 [facility] 10/07/19 0419 JDW Performed at [facility], [address], [place], [place]    Culture KLEBSIELLA PNEUMONIAE (A)  Final   Report Status 10/09/2019 FINAL  Final   Organism ID, Bacteria KLEBSIELLA PNEUMONIAE  Final      Susceptibility   Klebsiella pneumoniae - MIC*    AMPICILLIN >=32 RESISTANT Resistant     CEFAZOLIN <=4 SENSITIVE  Sensitive     CEFEPIME <=1 SENSITIVE Sensitive     CEFTAZIDIME <=1 SENSITIVE Sensitive     CEFTRIAXONE <=1 SENSITIVE Sensitive     CIPROFLOXACIN <=0.25 SENSITIVE Sensitive     GENTAMICIN <=1 SENSITIVE Sensitive     IMIPENEM 0.5 SENSITIVE Sensitive     TRIMETH/SULFA <=20 SENSITIVE Sensitive     AMPICILLIN/SULBACTAM 8 SENSITIVE Sensitive     PIP/TAZO <=4 SENSITIVE Sensitive     Extended ESBL NEGATIVE Sensitive     * KLEBSIELLA PNEUMONIAE  Blood Culture ID Panel (Reflexed)     Status: Abnormal   Collection Time: 10/06/19 12:40 PM  Result Value Ref Range Status   Enterococcus species NOT DETECTED NOT DETECTED Final   Listeria monocytogenes NOT DETECTED NOT DETECTED Final   Staphylococcus species NOT DETECTED NOT DETECTED Final   Staphylococcus aureus (BCID) NOT DETECTED NOT DETECTED Final   Streptococcus species NOT DETECTED NOT DETECTED Final   Streptococcus agalactiae NOT DETECTED NOT DETECTED Final   Streptococcus pneumoniae NOT DETECTED NOT DETECTED Final   Streptococcus pyogenes NOT DETECTED NOT DETECTED Final   Acinetobacter baumannii NOT DETECTED NOT DETECTED Final   Enterobacteriaceae species DETECTED (A) NOT DETECTED Final    Comment: Enterobacteriaceae represent a large family of gram-negative bacteria, not a single organism. CRITICAL RESULT CALLED TO, READ BACK BY AND VERIFIED WITH: G ABBOTT PHARMD 10/07/19 0419 JDW    Enterobacter cloacae complex NOT DETECTED NOT DETECTED Final   Escherichia coli NOT DETECTED NOT DETECTED Final   Klebsiella oxytoca NOT DETECTED NOT DETECTED Final   Klebsiella pneumoniae DETECTED (A) NOT DETECTED Final    Comment: CRITICAL RESULT CALLED TO, READ BACK BY AND VERIFIED WITH: G ABBOTT PHARMD 10/07/19 0419 JDW    Proteus species NOT DETECTED NOT DETECTED Final   Serratia marcescens NOT DETECTED NOT DETECTED Final   Carbapenem resistance NOT DETECTED NOT DETECTED Final   Haemophilus influenzae NOT DETECTED NOT DETECTED Final   Neisseria  meningitidis NOT DETECTED NOT DETECTED Final   Pseudomonas aeruginosa NOT DETECTED NOT DETECTED Final   Candida albicans NOT DETECTED NOT DETECTED Final   Candida glabrata NOT DETECTED NOT DETECTED Final   Candida krusei NOT  DETECTED NOT DETECTED Final   Candida parapsilosis NOT DETECTED NOT DETECTED Final   Candida tropicalis NOT DETECTED NOT DETECTED Final    Comment: Performed at [facility], [address] [place], [place] [address]  Blood Culture (routine x 2)     Status: Abnormal   Collection Time: 10/06/19 12:45 PM   Specimen: BLOOD RIGHT HAND  Result Value Ref Range Status   Specimen Description BLOOD RIGHT HAND  Final   Special Requests   Final    BOTTLES DRAWN AEROBIC AND ANAEROBIC Blood Culture results may not be optimal due to an inadequate volume of blood received in culture bottles   Culture  Setup Time   Final    IN BOTH AEROBIC AND ANAEROBIC BOTTLES GRAM NEGATIVE RODS CRITICAL VALUE NOTED.  VALUE IS CONSISTENT WITH PREVIOUSLY REPORTED AND CALLED VALUE.    Culture (A)  Final    KLEBSIELLA PNEUMONIAE SUSCEPTIBILITIES PERFORMED ON PREVIOUS CULTURE WITHIN THE LAST 5 DAYS. Performed at [facility], [address] [place], [place] [address]    Report Status 10/09/2019 FINAL  Final  SARS CORONAVIRUS 2 (TAT 6-24 HRS) Nasopharyngeal Nasopharyngeal Swab     Status: None   Collection Time: 10/06/19  3:35 PM   Specimen: Nasopharyngeal Swab  Result Value Ref Range Status   SARS Coronavirus 2 NEGATIVE NEGATIVE Final    Comment: (NOTE) SARS-CoV-2 target nucleic acids are NOT DETECTED. The SARS-CoV-2 RNA is generally detectable in upper and lower respiratory specimens during the acute phase of infection. Negative results do not preclude SARS-CoV-2 infection, do not rule out co-infections with other pathogens, and should not be used as the sole basis for treatment or other patient management decisions. Negative results must be combined with clinical observations,  patient history, and epidemiological information. The expected result is Negative. Fact Sheet for Patients: HairSlick.nohttps://www.fda.gov/media/138098/download Fact Sheet for Healthcare Providers: quierodirigir.comhttps://www.fda.gov/media/138095/download This test is not yet approved or cleared by the [place] FDA and  has been authorized for detection and/or diagnosis of SARS-CoV-2 by FDA under an Emergency Use Authorization (EUA). This EUA will remain  in effect (meaning this test can be used) for the duration of the COVID-19 declaration under Section 56 4(b)(1) of the Act, 21 U.S.C. section 360bbb-3(b)(1), unless the authorization is terminated or revoked sooner. Performed at [facility], [address] [place], [place] [address]   Urine culture     Status: Abnormal   Collection Time: 10/06/19  4:02 PM   Specimen: In/Out Cath Urine  Result Value Ref Range Status   Specimen Description IN/OUT CATH URINE  Final   Special Requests   Final    NONE Performed at [facility], [address] [place], [place] [address]    Culture (A)  Final    30,000 COLONIES/mL ESCHERICHIA COLI Confirmed Extended Spectrum Beta-Lactamase Producer (ESBL).  In bloodstream infections from ESBL organisms, carbapenems are preferred over piperacillin/tazobactam. They are shown to have a lower risk of mortality. 10,000 COLONIES/mL STAPHYLOCOCCUS EPIDERMIDIS    Report Status 10/08/2019 FINAL  Final   Organism ID, Bacteria ESCHERICHIA COLI (A)  Final   Organism ID, Bacteria STAPHYLOCOCCUS EPIDERMIDIS (A)  Final      Susceptibility   Escherichia coli - MIC*    AMPICILLIN >=32 RESISTANT Resistant     CEFAZOLIN >=64 RESISTANT Resistant     CEFTRIAXONE >=64 RESISTANT Resistant     CIPROFLOXACIN >=4 RESISTANT Resistant     GENTAMICIN <=1 SENSITIVE Sensitive  IMIPENEM <=0.25 SENSITIVE Sensitive     NITROFURANTOIN <=16 SENSITIVE Sensitive     TRIMETH/SULFA <=20 SENSITIVE Sensitive     AMPICILLIN/SULBACTAM 4  SENSITIVE Sensitive     PIP/TAZO <=4 SENSITIVE Sensitive     Extended ESBL POSITIVE Resistant     * 30,000 COLONIES/mL ESCHERICHIA COLI   Staphylococcus epidermidis - MIC*    CIPROFLOXACIN 2 INTERMEDIATE Intermediate     GENTAMICIN <=0.5 SENSITIVE Sensitive     NITROFURANTOIN <=16 SENSITIVE Sensitive     OXACILLIN >=4 RESISTANT Resistant     TETRACYCLINE <=1 SENSITIVE Sensitive     VANCOMYCIN 1 SENSITIVE Sensitive     TRIMETH/SULFA <=10 SENSITIVE Sensitive     CLINDAMYCIN <=0.25 SENSITIVE Sensitive     RIFAMPIN <=0.5 SENSITIVE Sensitive     Inducible Clindamycin NEGATIVE Sensitive     * 10,000 COLONIES/mL STAPHYLOCOCCUS EPIDERMIDIS  MRSA PCR Screening     Status: None   Collection Time: 10/08/19 10:50 PM   Specimen: Nasal Mucosa; Nasopharyngeal  Result Value Ref Range Status   MRSA by PCR NEGATIVE NEGATIVE Final    Comment:        The GeneXpert MRSA Assay (FDA approved for NASAL specimens only), is one component of a comprehensive MRSA colonization surveillance program. It is not intended to diagnose MRSA infection nor to guide or monitor treatment for MRSA infections. Performed at [facility], [place] [address] [place], [place] [address]   Body fluid culture     Status: None   Collection Time: 10/10/19 12:59 PM   Specimen: A: Abdomen; Peritoneal Fluid   B: Abdomen; Peritoneal Fluid  Result Value Ref Range Status   Specimen Description ABDOMEN  Final   Special Requests NONE  Final   Gram Stain   Final    FEW WBC PRESENT,BOTH PMN AND MONONUCLEAR NO ORGANISMS SEEN    Culture   Final    NO GROWTH Performed at [facility], [address], [place], [place] [address]    Report Status 10/13/2019 FINAL  Final  Aerobic/Anaerobic Culture (surgical/deep wound)     Status: None (Preliminary result)   Collection Time: 10/10/19  2:26 PM   Specimen: Abscess  Result Value Ref Range Status   Specimen Description ABSCESS LIVER  Final   Special Requests NONE  Final    Gram Stain   Final    ABUNDANT WBC PRESENT,BOTH PMN AND MONONUCLEAR FEW GRAM VARIABLE ROD RARE GRAM NEGATIVE RODS Performed at [facility], [place] [address], [place], [place] [address]    Culture   Final    MODERATE KLEBSIELLA PNEUMONIAE NO ANAEROBES ISOLATED; CULTURE IN PROGRESS FOR 5 DAYS    Report Status PENDING  Incomplete   Organism ID, Bacteria KLEBSIELLA PNEUMONIAE  Final      Susceptibility   Klebsiella pneumoniae - MIC*    AMPICILLIN >=32 RESISTANT Resistant     CEFAZOLIN <=4 SENSITIVE Sensitive     CEFEPIME <=1 SENSITIVE Sensitive     CEFTAZIDIME <=1 SENSITIVE Sensitive     CEFTRIAXONE <=1 SENSITIVE Sensitive     CIPROFLOXACIN <=0.25 SENSITIVE Sensitive     GENTAMICIN <=1 SENSITIVE Sensitive     IMIPENEM <=0.25 SENSITIVE Sensitive     TRIMETH/SULFA <=20 SENSITIVE Sensitive     AMPICILLIN/SULBACTAM 4 SENSITIVE Sensitive     PIP/TAZO <=4 SENSITIVE Sensitive     Extended ESBL NEGATIVE Sensitive     * MODERATE KLEBSIELLA PNEUMONIAE  Culture, blood (routine x 2)     Status: None (  Preliminary result)   Collection Time: 10/11/19  5:05 PM   Specimen: BLOOD RIGHT ARM  Result Value Ref Range Status   Specimen Description BLOOD RIGHT ARM  Final   Special Requests AEROBIC BOTTLE ONLY Blood Culture adequate volume  Final   Culture   Final    NO GROWTH 3 DAYS Performed at [facility], [address], [place], [place] [address]    Report Status PENDING  Incomplete  Culture, blood (routine x 2)     Status: None (Preliminary result)   Collection Time: 10/11/19  5:05 PM   Specimen: BLOOD RIGHT ARM  Result Value Ref Range Status   Specimen Description BLOOD RIGHT ARM  Final   Special Requests AEROBIC BOTTLE ONLY Blood Culture adequate volume  Final   Culture   Final    NO GROWTH 3 DAYS Performed at [facility], [address], [place], [place] [address]    Report Status PENDING  Incomplete         Radiology Studies: [place] Ekg Site Rite  Result Date:  10/12/2019 If Site Rite image not attached, placement could not be confirmed due to current cardiac rhythm.       Scheduled Meds: . chlorhexidine  15 mL Mouth Rinse BID  . Chlorhexidine Gluconate Cloth  6 each Topical Daily  . Chlorhexidine Gluconate Cloth  6 each Topical Q0600  . enoxaparin (LOVENOX) injection  30 mg Subcutaneous Q24H  . flecainide  50 mg Oral BID  . insulin aspart  0-9 Units Subcutaneous TID WC  . insulin glargine  10 Units Subcutaneous Daily  . mouth rinse  15 mL Mouth Rinse q12n4p  . metroNIDAZOLE  500 mg Oral Q8H  . pantoprazole  40 mg Oral Daily  . sodium chloride flush  10-40 mL Intracatheter Q12H   Continuous Infusions: . sodium chloride Stopped (10/09/19 0959)  . cefTRIAXone (ROCEPHIN)  IV 2 g (10/13/19 2110)  . lactated ringers Stopped (10/12/19 1056)     LOS: 8 days    Time spent: 25 minutes    Alberteen Sam, MD [facility] 10/14/2019, 11:19 AM     Please page through AMION:  [email or web address] If 7PM-7AM, please contact night-coverage

## 2019-10-14 NOTE — Consult Note (Addendum)
Cardiology Consultation:   Patient ID: Courtney [name] MRN: [id number]; DOB: 07-12-1946  Admit date: 10/06/2019 Date of Consult: 10/14/2019  Primary Care Provider: Patient, No Pcp Per Primary Cardiologist: Skeet Latch, MD  Primary Electrophysiologist:  Virl Axe, MD    Patient Profile:   Courtney [name] is a 73 y.o. 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 Loleta Books.  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    Per Dr. Marlou Porch with TEE "In the setting of current bacteremia and implanted pacemaker, there do appear to be small filamentous strand-like material attached to the atrial surface of the mitral valve and just proximal to the aortic valve in the LVOT which could possibly represent vegetative material.  This does not have the clinical appearance of the typical thick or shaggy vegetation.  Nonetheless, consider treatment for possible endocarditis"  She is on ABX day 8 on ceftriaxone and metronidazole now .  Pt with tachy brady syndrome and had PPM placed 08/2017 ST Jude device.  She is on Eliquis, flecainide, lopressor as outpt.  No hx of CAD  On admit did have some a fib with RVR and placed on dilt drip.   Currently in SR. On flecainide, no anticoagulation with drain of liver abscess, no BB and dilt drip.  CHA2DS2VASc of  3  Her liver abscess has been drained by IR.   EKG:  The EKG was personally reviewed and  demonstrates:  Initial EKG SR with mild ST depression in lateral leads, later same day with a fib RVR and ST depression V3-V6 and I, II, AVL.  Telemetry:  Telemetry was personally reviewed and demonstrates:  SR  Today Na 135, K+ 3.6, BUN 5, Cr 0.54 Mg+ 1.6, AST 85 and ALT 193 all decreasing.   Hgb 9.2 down from 10.4  and WBC 17.7 down from 20.4 (pk 20.8) Reactive Hep A and Hep B core total AB diabetes uncontrolled with hgb A1c 16.4  BP 113/54 to 108/53 P 70s R 19-21 No complaints today   Heart Pathway Score:     Past Medical History:  Diagnosis Date   Allergy    Diabetes mellitus type 2, diet-controlled ([place])    PAF (paroxysmal atrial fibrillation) ([place]) 09/04/2017   Sinus bradycardia 09/04/2017   HR 30s at times   Tachy-brady syndrome ([place]) 09/04/2017    Past Surgical History:  Procedure Laterality Date   IR PARACENTESIS  10/10/2019   IR US GUIDE BX ASP/DRAIN  10/10/2019   PACEMAKER IMPLANT N/A 09/05/2017   Procedure: Pacemaker Implant;  Surgeon: Deboraha Sprang, MD;  Location: [facility];  Service: Cardiovascular;  Laterality: N/A;     Home Medications:  Prior to Admission medications   Medication Sig Start Date End Date Taking? Authorizing Provider  acetaminophen (TYLENOL) 325 MG tablet Take 1-2 tablets (325-650 mg total) by mouth every 6 (six) hours as needed for mild pain or moderate pain.  09/06/17  Yes Baldwin [place], PA-C  apixaban (ELIQUIS) 5 MG TABS tablet Take 1 tablet (5 mg total) by mouth 2 (two) times daily. 07/26/19  Yes Deboraha Sprang, MD  flecainide (TAMBOCOR) 50 MG tablet Take 1 tablet (50 mg total) by mouth 2 (two) times daily. 07/26/19  Yes Deboraha Sprang, MD  metoprolol tartrate (LOPRESSOR) 50 MG tablet Take 1 tablet (50 mg total) by mouth 2 (two) times daily. Please keep upcoming appt in September with Dr. Caryl Comes before anymore refills. Thank you 08/31/19  Yes Deboraha Sprang, MD  glucose monitoring kit (FREESTYLE) monitoring kit Check cbgs bid  as instructed by pcp. 01/24/16   Shawnee Knapp, MD    Inpatient Medications: Scheduled Meds:  chlorhexidine  15 mL Mouth Rinse BID   Chlorhexidine Gluconate Cloth  6 each Topical Daily   Chlorhexidine Gluconate Cloth  6 each Topical Q0600   enoxaparin (LOVENOX) injection  30 mg Subcutaneous Q24H   flecainide  50 mg Oral BID   insulin aspart  0-9 Units Subcutaneous TID WC   insulin glargine  10 Units Subcutaneous Daily   mouth rinse  15 mL Mouth Rinse q12n4p   metroNIDAZOLE  500 mg Oral Q8H   pantoprazole (PROTONIX) IV  40 mg Intravenous Q24H   sodium chloride flush  10-40 mL Intracatheter Q12H   Continuous Infusions:  sodium chloride Stopped (10/09/19 0959)   cefTRIAXone (ROCEPHIN)  IV 2 g (10/13/19 2110)   lactated ringers Stopped (10/12/19 1056)   PRN Meds: sodium chloride, acetaminophen, dextrose, sodium chloride flush  Allergies:   No Known Allergies  Social History:   Social History   Socioeconomic History   Marital status: Single    Spouse name: Not on file   Number of children: Not on file   Years of education: Not on file   Highest education level: Not on file  Occupational History   Not on file  Social Needs   Financial resource strain: Not on file   Food insecurity    Worry: Not on file    Inability: Not on file   Transportation needs    Medical: Not on file    Non-medical: Not on file  Tobacco Use   Smoking status: Current Every Day Smoker   Smokeless tobacco: Never Used  Substance and Sexual Activity   Alcohol use: No    Frequency: Never   Drug use: No   Sexual activity: Not on file  Lifestyle   Physical activity    Days per week: Not on file    Minutes per session: Not on file   Stress: Not on file  Relationships   Social connections    Talks on phone: Not on file    Gets together: Not on file    Attends religious service: Not on file    Active member of club or organization: Not on file    Attends meetings of  clubs or organizations: Not on file    Relationship status: Not on file   Intimate partner violence    Fear of current or ex partner: Not on file    Emotionally abused: Not on file    Physically abused: Not on file    Forced sexual activity: Not on file  Other Topics Concern   Not on file  Social History Narrative   Not on file    Family History:    Family History  Problem Relation Age of Onset   Diabetes Son  ROS:  Please see the history of present illness.  Info from chart, pt does not speak [place] General:no colds or fevers, no weight changes Skin:no rashes or ulcers HEENT:no blurred vision, no congestion CV:see HPI PUL:see HPI GI:no diarrhea constipation or melena, no indigestion GU:no hematuria, no dysuria MS:no joint pain, no claudication Neuro:no syncope, no lightheadedness, mental status improved Endo:+ diabetes, no thyroid disease  All other ROS reviewed and negative.     Physical Exam/Data:   Vitals:   10/13/19 1300 10/13/19 1626 10/13/19 2039 10/14/19 0621  BP: (!) 97/53 (!) 113/47 (!) 103/51 (!) 113/54  Pulse: 77 76 77 71  Resp: (!) 26 (!) 24 (!) 21 (!) 24  Temp: 100 F (37.8 C) 99.3 F (37.4 C) 99.3 F (37.4 C) 98.4 F (36.9 C)  TempSrc: Axillary Oral Oral Oral  SpO2:   100% 96%  Weight:      Height:        Intake/Output Summary (Last 24 hours) at 10/14/2019 0818 Last data filed at 10/14/2019 0600 Gross per 24 hour  Intake 1843.88 ml  Output 2350 ml  Net -506.12 ml   Last 3 Weights 10/12/2019 10/08/2019 10/07/2019  Weight (lbs) 105 lb 2.6 oz 105 lb 2.6 oz 99 lb 10.4 oz  Weight (kg) 47.7 kg 47.7 kg 45.2 kg     Body mass index is 39.42 kg/m.  General:  Well nourished, well developed, in no acute distress HEENT: normal Lymph: no adenopathy Neck: mild JVD Endocrine:  No thryomegaly Vascular: No carotid bruits; pedal pulses 2+ bilaterally Cardiac:  normal S1, S2; RRR; no murmur gallup rub or click Lungs:  dimiinished to  auscultation bilaterally, no wheezing, rhonchi or rales  Abd: soft, nontender, no hepatomegaly  Ext: no edema Musculoskeletal:  No deformities, BUE and BLE strength normal and equal Skin: warm and dry  Neuro:  Follows commands when understands, no focal abnormalities noted Psych:  Normal affect     Relevant CV Studies: TEE 10/12/19  Findings:  Left Ventricle: Normal EF 55%  Mitral Valve: Mild anterior leaflet prolapse with mild mitral regurgitation.  There are faint, filamentous strands attached to the atrial surface of the mitral valve leaflets, freely mobile, challenging to visualize.  This may represent vegetative material versus degenerative material of mitral valve.  They are not as robust as ruptured cords.  Aortic Valve: Trileaflet, no significant aortic regurgitation.  There are a conglomeration of small filamentous strands attached to a segment of the left ventricular outflow tract approximately 5 mm below the aortic valve leaflets that are freely mobile, possibly representing vegetative material.  Tricuspid Valve: Normal-appearing valve.  Left Atrium: No left atrial thrombus, no shunt  Right atrium: Pacemaker wire normal with no evidence of vegetations  Right ventricle: Pacemaker wire normal with no evidence of vegetations  Impressions: In the setting of current bacteremia and implanted pacemaker, there do appear to be small filamentous strand-like material attached to the atrial surface of the mitral valve and just proximal to the aortic valve in the LVOT which could possibly represent vegetative material.  This does not have the clinical appearance of the typical thick or shaggy vegetation.  Nonetheless, consider treatment for possible endocarditis.    TTE  09/05/17 Study Conclusions  - Left ventricle: The cavity size was normal. Wall thickness was   normal. Systolic function was normal. The estimated ejection   fraction was in the range of 60% to 65%. Wall  motion was normal;   there were no regional wall motion  abnormalities. Features are   consistent with a pseudonormal left ventricular filling pattern,   with concomitant abnormal relaxation and increased filling   pressure (grade 2 diastolic dysfunction). - Aortic valve: There was no stenosis. - Mitral valve: There was trivial regurgitation. - Left atrium: The atrium was mildly dilated. - Right ventricle: The cavity size was normal. Systolic function   was normal. - Tricuspid valve: Peak RV-RA gradient (S): 26 mm Hg. - Pulmonary arteries: PA peak pressure: 29 mm Hg (S). - Inferior vena cava: The vessel was normal in size. The   respirophasic diameter changes were in the normal range (>= 50%),   consistent with normal central venous pressure.  Impressions:  - Normal LV size with EF 60-65%. Normal RV size and systolic   function. No significant valvular abnormalities.   Laboratory Data:  High Sensitivity Troponin:  No results for input(s): TROPONINIHS in the last 720 hours.   Chemistry Recent Labs  Lab 10/12/19 0235 10/13/19 0314 10/14/19 0518  NA 140 136 135  K 3.7 3.4* 3.6  CL 111 103 105  CO2 21* 21* 24  GLUCOSE 128* 169* 155*  BUN 9 6* 5*  CREATININE 0.61 0.61 0.54  CALCIUM 7.5* 7.6* 7.5*  GFRNONAA >60 >60 >60  GFRAA >60 >60 >60  ANIONGAP '8 12 6    '$ Recent Labs  Lab 10/12/19 0235 10/13/19 0314 10/14/19 0518  PROT 4.3* 5.1* 5.0*  ALBUMIN 1.6* 1.6* 1.6*  AST 267* 180* 85*  ALT 334* 285* 193*  ALKPHOS 123 111 103  BILITOT 0.9 1.1 0.9   Hematology Recent Labs  Lab 10/12/19 0235 10/13/19 0314 10/14/19 0518  WBC 22.2* 20.4* 17.7*  RBC 3.47* 3.38* 2.96*  HGB 10.7* 10.4* 9.2*  HCT 31.3* 30.9* 27.4*  MCV 90.2 91.4 92.6  MCH 30.8 30.8 31.1  MCHC 34.2 33.7 33.6  RDW 13.2 13.2 13.3  PLT 139* 209 267   BNPNo results for input(s): BNP, PROBNP in the last 168 hours.  DDimer No results for input(s): DDIMER in the last 168 hours.   Radiology/Studies:  Ir  US Guide Bx Asp/drain  Result Date: 10/10/2019 INDICATION: 73 year old with bacteremia and concern for cholangitis and hepatic abscess. Cystic duct was patent on recent HIDA examination. EXAM: ULTRASOUND-GUIDED PARACENTESIS ULTRASOUND-GUIDED HEPATIC ABSCESS ASPIRATION MEDICATIONS: The patient is currently admitted to the hospital and receiving intravenous antibiotics. ANESTHESIA/SEDATION: Fentanyl 12.5 mcg IV; Versed 0.5 mg IV Moderate Sedation Time:  12 minutes The patient was continuously monitored during the procedure by the interventional radiology nurse under my direct supervision. COMPLICATIONS: None immediate. PROCEDURE: Informed written consent was obtained from the patient's son after a thorough discussion of the procedural risks, benefits and alternatives. All questions were addressed. Maximal Sterile Barrier Technique was utilized including caps, mask, sterile gowns, sterile gloves, sterile drape, hand hygiene and skin antiseptic. A timeout was performed prior to the initiation of the procedure. Patient was placed supine on the interventional table. Ultrasound was used to evaluate the abdomen. New perihepatic ascites was identified. Skin was anesthetized with 1% lidocaine. Using ultrasound guidance, a 19 gauge Yueh catheter was directed into the perihepatic space and a paracentesis was performed. The paracentesis catheter was kept in place as attention was directed to the intrahepatic lesion. Ultrasound demonstrated irregular hypoechoic areas in liver concerning for intrahepatic abscess. After the perihepatic ascites had been removed, a new 19 gauge Yueh catheter was directed into the liver and into the hypoechoic hepatic area with ultrasound guidance. Tan colored purulent fluid was aspirated. Approximately  10 mL of purulent fluid was removed from the liver. Following the aspiration, there was no significant collection remaining. Stiff Amplatz wire was advanced into the liver but with the wire was not  forming within a well-defined collection. Drain was not placed. Yueh catheter was removed. Small amount of bloody material was removed from the paracentesis catheter prior to removal. Bandage placed over the puncture sites. FINDINGS: New perihepatic ascites. Approximately 220 mL of predominantly yellow fluid was removed from the perihepatic space. Small amount of blood was aspirated from the paracentesis catheter after the liver aspiration. Small irregular hypoechoic collections in the liver. Approximately 10 mL of purulent fluid was removed from these collections and findings are compatible with a poorly formed hepatic abscess. The hepatic abscess was decompressed following aspiration. IMPRESSION: 1. Ultrasound-guided aspiration of the intrahepatic abscess/abscesses. Total of 10 mL of purulent fluid was removed. There was not a well-defined collection within the liver based on wire placement. In addition, the liver abscess appeared to be completely decompressed by the end of the procedure. Therefore, hepatic drain was not placed. In addition, there is concern about placing an intrahepatic drain in the setting of ascites. Abscess fluid was sent for culture. 2. Ultrasound-guided paracentesis. Approximately 220 mL of peritoneal fluid was removed. Fluid was sent for analysis. Electronically Signed   By: Markus Daft M.D.   On: 10/10/2019 15:12   [place] Ekg Site Rite  Result Date: 10/12/2019 If Site Rite image not attached, placement could not be confirmed due to current cardiac rhythm.  Ir Paracentesis  Result Date: 10/10/2019 INDICATION: 73 year old with bacteremia and concern for cholangitis and hepatic abscess. Cystic duct was patent on recent HIDA examination. EXAM: ULTRASOUND-GUIDED PARACENTESIS ULTRASOUND-GUIDED HEPATIC ABSCESS ASPIRATION MEDICATIONS: The patient is currently admitted to the hospital and receiving intravenous antibiotics. ANESTHESIA/SEDATION: Fentanyl 12.5 mcg IV; Versed 0.5 mg IV Moderate  Sedation Time:  12 minutes The patient was continuously monitored during the procedure by the interventional radiology nurse under my direct supervision. COMPLICATIONS: None immediate. PROCEDURE: Informed written consent was obtained from the patient's son after a thorough discussion of the procedural risks, benefits and alternatives. All questions were addressed. Maximal Sterile Barrier Technique was utilized including caps, mask, sterile gowns, sterile gloves, sterile drape, hand hygiene and skin antiseptic. A timeout was performed prior to the initiation of the procedure. Patient was placed supine on the interventional table. Ultrasound was used to evaluate the abdomen. New perihepatic ascites was identified. Skin was anesthetized with 1% lidocaine. Using ultrasound guidance, a 19 gauge Yueh catheter was directed into the perihepatic space and a paracentesis was performed. The paracentesis catheter was kept in place as attention was directed to the intrahepatic lesion. Ultrasound demonstrated irregular hypoechoic areas in liver concerning for intrahepatic abscess. After the perihepatic ascites had been removed, a new 19 gauge Yueh catheter was directed into the liver and into the hypoechoic hepatic area with ultrasound guidance. Tan colored purulent fluid was aspirated. Approximately 10 mL of purulent fluid was removed from the liver. Following the aspiration, there was no significant collection remaining. Stiff Amplatz wire was advanced into the liver but with the wire was not forming within a well-defined collection. Drain was not placed. Yueh catheter was removed. Small amount of bloody material was removed from the paracentesis catheter prior to removal. Bandage placed over the puncture sites. FINDINGS: New perihepatic ascites. Approximately 220 mL of predominantly yellow fluid was removed from the perihepatic space. Small amount of blood was aspirated from the paracentesis catheter after the  liver  aspiration. Small irregular hypoechoic collections in the liver. Approximately 10 mL of purulent fluid was removed from these collections and findings are compatible with a poorly formed hepatic abscess. The hepatic abscess was decompressed following aspiration. IMPRESSION: 1. Ultrasound-guided aspiration of the intrahepatic abscess/abscesses. Total of 10 mL of purulent fluid was removed. There was not a well-defined collection within the liver based on wire placement. In addition, the liver abscess appeared to be completely decompressed by the end of the procedure. Therefore, hepatic drain was not placed. In addition, there is concern about placing an intrahepatic drain in the setting of ascites. Abscess fluid was sent for culture. 2. Ultrasound-guided paracentesis. Approximately 220 mL of peritoneal fluid was removed. Fluid was sent for analysis. Electronically Signed   By: Markus Daft M.D.   On: 10/10/2019 15:12    Assessment and Plan:   1. Endocarditis with PPM - EP to eval for need to remove.   2. PAF with tachy brady syndrome had PAF on admit now in SR on Flecainide and no AV nodal blocking agents to prevent SVT,  BB stopped due to soft BP.   Add low dose BB back though will ask DR. Lovena Le to address . Off anticoagulation with CHA2DS2VASC 3, would resume anticoagulation when able post drainage of liver abscess.   3. Bacteremia (Klebsiella) gram neg.with sepsis, AMS, PNA and liver abscess.   Per ID,IM and GI 4. Uncontrolled DM per IM  For questions or updates, please contact [place] Please consult [email or web address] for contact info under   Signed, Cecilie Kicks, NP  10/14/2019 8:18 AM  EP Attending  Patient seen and examined. Agree with the findings as noted above. The patient has Klebsiella bacteremia with possible involvement of the mitral and aortic valve but no veg seen on PM leads. I would recommend a full course of anti-biotic therapy as per the ID service followed by surviellance blood  cultures. While this is thought to be a hypervirulent strain of Klebsiella, these germs usually do not stick to pacing/ICD leads. Obviously if she had treatment failure with recurrent bacteremia after a full course of anti-biotics, then extraction of the pacing system would be required.   Mikle Bosworth.D.

## 2019-10-14 NOTE — Progress Notes (Signed)
Gastroenterology Inpatient Follow-up Note   PATIENT IDENTIFICATION  Palmyra H Speich is a 73 y.o. female Hospital Day: 9  SUBJECTIVE  No significant changes. Patient denies any fevers or chills. Abdominal discomfort still present. More cheerful and interactive. Appetite not great.   OBJECTIVE  Scheduled Inpatient Medications:  . chlorhexidine  15 mL Mouth Rinse BID  . Chlorhexidine Gluconate Cloth  6 each Topical Daily  . Chlorhexidine Gluconate Cloth  6 each Topical Q0600  . enoxaparin (LOVENOX) injection  30 mg Subcutaneous Q24H  . flecainide  50 mg Oral BID  . insulin aspart  0-9 Units Subcutaneous TID WC  . insulin glargine  10 Units Subcutaneous Daily  . mouth rinse  15 mL Mouth Rinse q12n4p  . metroNIDAZOLE  500 mg Oral Q8H  . pantoprazole (PROTONIX) IV  40 mg Intravenous Q24H  . sodium chloride flush  10-40 mL Intracatheter Q12H   Continuous Inpatient Infusions:  . sodium chloride Stopped (10/09/19 0959)  . cefTRIAXone (ROCEPHIN)  IV 2 g (10/13/19 2110)  . lactated ringers Stopped (10/12/19 1056)   PRN Inpatient Medications: sodium chloride, acetaminophen, dextrose, sodium chloride flush   Physical Examination  Temp:  [98.5 F (36.9 C)-100 F (37.8 C)] 99.3 F (37.4 C) (10/31 2039) Pulse Rate:  [64-77] 77 (10/31 2039) Resp:  [20-26] 21 (10/31 2039) BP: (91-113)/(40-58) 103/51 (10/31 2039) SpO2:  [94 %-100 %] 100 % (10/31 2039) Temp (24hrs), Avg:99.2 F (37.3 C), Min:98.5 F (36.9 C), Max:100 F (37.8 C)  Weight: 47.7 kg GEN: Elderly, chronically ill-appearing, PSYCH: Cooperative EYE: Conjunctivae pale-pink, sclerae anicteric ENT: Dry mucous membranes CV: Nontachycardic RESP: Decreased breath sounds at the bases bilaterally GI: NABS, soft, mildly protuberant, without rebound or guarding MSK/EXT: Bilateral lower extremity edema present SKIN: No jaundice, NEURO:  Alert & Oriented x 2   Review of Data   Laboratory Studies   Recent Labs  Lab 10/11/19  1645  10/13/19 0314  NA 139   < > 136  K 3.7   < > 3.4*  CL 110   < > 103  CO2 21*   < > 21*  BUN 15   < > 6*  CREATININE 0.73   < > 0.61  GLUCOSE 129*   < > 169*  CALCIUM 7.6*   < > 7.6*  MG 1.7  --   --    < > = values in this interval not displayed.   Recent Labs  Lab 10/13/19 0314  AST 180*  ALT 285*  ALKPHOS 111    Recent Labs  Lab 10/11/19 0233 10/12/19 0235 10/13/19 0314  WBC 20.8* 22.2* 20.4*  HGB 11.9* 10.7* 10.4*  HCT 34.1* 31.3* 30.9*  PLT 98* 139* 209   Recent Labs  Lab 10/08/19 1215 10/09/19 1102  INR 1.3* 1.3*   MELD-Na score: 9 at 10/11/2019  4:45 PM MELD score: 9 at 10/11/2019  4:45 PM Calculated from: Serum Creatinine: 0.73 mg/dL (Rounded to 1 mg/dL) at 10/11/2019  4:45 PM Serum Sodium: 139 mmol/L (Rounded to 137 mmol/L) at 10/11/2019  4:45 PM Total Bilirubin: 1.0 mg/dL at 10/11/2019  2:33 AM INR(ratio): 1.3 at 10/09/2019 11:02 AM Age: 55 years 2 months  Imaging Studies  No new imaging studies to review   ASSESSMENT  Ms. Freiberger is a 73 y.o. female with a pmh significant for diabetes, paroxysmal A. Fib, GNR bacteremia, recent liver abscess status post aspiration.  Overall, the patient mains clinically stable.  I think she should have a CT abdomen/pelvis  with IV/oral contrast performed later today versus tomorrow.  I will order it for tomorrow if it does not get completed by the primary service today.  This is in agreement with the previous ID recommendations from yesterday.  This will help to determine how the biliary tree looks and see if any other hepatic abscesses are present such that would require further abscess aspiration/drainage.  Unless the biliary tree has become dilated, I would not necessarily proceed with an ERCP.  Query the role of an endoscopic ultrasound but not clear if there is no significant biliary dilation.  Still think that this was probably gallbladder mediated when she presented although her HIDA was negative.  If no other  evidence of issues on imaging then we will have to consider a long course of antibiotics with close monitoring of her liver thereafter.   PLAN/RECOMMENDATIONS  Repeat CT with imaging on 11/1 LFTs in the morning Antibiotics as per ID and medicine service No ERCP at this time We will see him in follow-up after the CT has been completed   Please page/call with questions or concerns.   Corliss Parish, MD [facility] Office # [phone]

## 2019-10-15 ENCOUNTER — Inpatient Hospital Stay (HOSPITAL_COMMUNITY): Payer: Medicare HMO

## 2019-10-15 ENCOUNTER — Other Ambulatory Visit: Payer: Self-pay | Admitting: *Deleted

## 2019-10-15 ENCOUNTER — Encounter: Payer: Self-pay | Admitting: *Deleted

## 2019-10-15 ENCOUNTER — Encounter (HOSPITAL_COMMUNITY): Payer: Self-pay | Admitting: Cardiovascular Disease

## 2019-10-15 DIAGNOSIS — I700 Atherosclerosis of aorta: Secondary | ICD-10-CM

## 2019-10-15 DIAGNOSIS — I330 Acute and subacute infective endocarditis: Secondary | ICD-10-CM

## 2019-10-15 DIAGNOSIS — Z95828 Presence of other vascular implants and grafts: Secondary | ICD-10-CM

## 2019-10-15 DIAGNOSIS — A498 Other bacterial infections of unspecified site: Secondary | ICD-10-CM

## 2019-10-15 HISTORY — DX: Atherosclerosis of aorta: I70.0

## 2019-10-15 HISTORY — PX: IR GUIDED DRAIN W CATHETER PLACEMENT: IMG719

## 2019-10-15 LAB — COMPREHENSIVE METABOLIC PANEL
ALT: 151 U/L — ABNORMAL HIGH (ref 0–44)
AST: 56 U/L — ABNORMAL HIGH (ref 15–41)
Albumin: 1.8 g/dL — ABNORMAL LOW (ref 3.5–5.0)
Alkaline Phosphatase: 102 U/L (ref 38–126)
Anion gap: 6 (ref 5–15)
BUN: 6 mg/dL — ABNORMAL LOW (ref 8–23)
CO2: 25 mmol/L (ref 22–32)
Calcium: 7.5 mg/dL — ABNORMAL LOW (ref 8.9–10.3)
Chloride: 104 mmol/L (ref 98–111)
Creatinine, Ser: 0.49 mg/dL (ref 0.44–1.00)
GFR calc Af Amer: >60 mL/min (ref >60)
GFR calc non Af Amer: >60 mL/min (ref >60)
Glucose, Bld: 138 mg/dL — ABNORMAL HIGH (ref 70–99)
Potassium: 3.2 mmol/L — ABNORMAL LOW (ref 3.5–5.1)
Sodium: 135 mmol/L (ref 135–145)
Total Bilirubin: 0.6 mg/dL (ref 0.3–1.2)
Total Protein: 5.5 g/dL — ABNORMAL LOW (ref 6.5–8.1)

## 2019-10-15 LAB — GLUCOSE, CAPILLARY
Glucose-Capillary: 131 mg/dL — ABNORMAL HIGH (ref 70–99)
Glucose-Capillary: 130 mg/dL — ABNORMAL HIGH (ref 70–99)
Glucose-Capillary: 149 mg/dL — ABNORMAL HIGH (ref 70–99)
Glucose-Capillary: 246 mg/dL — ABNORMAL HIGH (ref 70–99)

## 2019-10-15 LAB — AEROBIC/ANAEROBIC CULTURE W GRAM STAIN (SURGICAL/DEEP WOUND)

## 2019-10-15 LAB — CBC
HCT: 27.2 % — ABNORMAL LOW (ref 36.0–46.0)
Hemoglobin: 9 g/dL — ABNORMAL LOW (ref 12.0–15.0)
MCH: 30.6 pg (ref 26.0–34.0)
MCHC: 33.1 g/dL (ref 30.0–36.0)
MCV: 92.5 fL (ref 80.0–100.0)
Platelets: 311 10*3/uL (ref 150–400)
RBC: 2.94 MIL/uL — ABNORMAL LOW (ref 3.87–5.11)
RDW: 13.2 % (ref 11.5–15.5)
WBC: 16.7 10*3/uL — ABNORMAL HIGH (ref 4.0–10.5)
nRBC: 0 % (ref 0.0–0.2)

## 2019-10-15 LAB — MAGNESIUM: Magnesium: 1.7 mg/dL (ref 1.7–2.4)

## 2019-10-15 MED ORDER — FENTANYL CITRATE (PF) 100 MCG/2ML IJ SOLN
25.0000 ug | Freq: Once | INTRAMUSCULAR | Status: AC
  Administered 2019-10-15: 18:00:00 25 ug via INTRAVENOUS
  Filled 2019-10-15: qty 2

## 2019-10-15 MED ORDER — OXYCODONE HCL 5 MG PO TABS
5.0000 mg | ORAL_TABLET | ORAL | Status: DC | PRN
  Administered 2019-10-15 – 2019-10-18 (×4): 5 mg via ORAL
  Filled 2019-10-15 (×4): qty 1

## 2019-10-15 MED ORDER — MIDAZOLAM HCL 2 MG/2ML IJ SOLN
INTRAMUSCULAR | Status: AC | PRN
  Administered 2019-10-15: 0.5 mg via INTRAVENOUS

## 2019-10-15 MED ORDER — FENTANYL CITRATE (PF) 100 MCG/2ML IJ SOLN
INTRAMUSCULAR | Status: AC | PRN
  Administered 2019-10-15: 12.5 ug via INTRAVENOUS
  Administered 2019-10-15: 25 ug via INTRAVENOUS

## 2019-10-15 MED ORDER — MIDAZOLAM HCL 2 MG/2ML IJ SOLN
INTRAMUSCULAR | Status: AC
  Filled 2019-10-15: qty 2

## 2019-10-15 MED ORDER — SODIUM CHLORIDE 0.9% FLUSH
5.0000 mL | Freq: Three times a day (TID) | INTRAVENOUS | Status: DC
  Administered 2019-10-15 – 2019-10-18 (×8): 5 mL

## 2019-10-15 MED ORDER — FENTANYL CITRATE (PF) 100 MCG/2ML IJ SOLN
INTRAMUSCULAR | Status: AC
  Filled 2019-10-15: qty 2

## 2019-10-15 MED ORDER — ADULT MULTIVITAMIN W/MINERALS CH
1.0000 | ORAL_TABLET | Freq: Every day | ORAL | Status: DC
  Administered 2019-10-16 – 2019-10-18 (×3): 1 via ORAL
  Filled 2019-10-15 (×3): qty 1

## 2019-10-15 MED ORDER — LIDOCAINE HCL (PF) 1 % IJ SOLN
INTRAMUSCULAR | Status: AC | PRN
  Administered 2019-10-15: 10 mL

## 2019-10-15 MED ORDER — LIDOCAINE HCL 1 % IJ SOLN
INTRAMUSCULAR | Status: AC
  Filled 2019-10-15: qty 20

## 2019-10-15 MED ORDER — ENSURE ENLIVE PO LIQD
237.0000 mL | Freq: Two times a day (BID) | ORAL | Status: DC
  Administered 2019-10-16 – 2019-10-17 (×3): 237 mL via ORAL

## 2019-10-15 MED ORDER — IOHEXOL 300 MG/ML  SOLN
50.0000 mL | Freq: Once | INTRAMUSCULAR | Status: AC | PRN
  Administered 2019-10-15: 4 mL

## 2019-10-15 NOTE — Procedures (Signed)
Interventional Radiology Procedure Note  Procedure: US Guided Drainage of hepatic abscess  Complications: None  Estimated Blood Loss: < 10 mL  Findings: 10 Fr drain placed in right lobe abscess with return of thick, bloody fluid. Drain attached to suction bulb drainage.  Will follow.  Venetia Night. Kathlene Cote, M.D Pager:  [phone]

## 2019-10-15 NOTE — Progress Notes (Signed)
PROGRESS NOTE    Courtney [name]  ZOX:[id number] DOB: February 02, 1946 DOA: 10/06/2019 PCP: Patient, No Pcp Per      Brief Narrative:  Courtney [name] is a 73 y.o. F with DM, Afib and tachy/brady s/p PPM with flecainide/Eliquis who presented with fever, confusion progressing over about 3 days.  In the ER, patient had Glu 369, Bicarb 9, WBC 14.9.  LP could not be obtained but she was started on empiric antibiotics with vancomycin, ceftriaxone and ampicillin to cover meningitis.      Assessment & Plan:  Sepsis from Klebsiella bacteremia resulting in multifocal pneumonia, liver abscess, valvular endocarditis Hepatic abscess, s/p drain 10/28 Admitted with confusion/encephalopathy, tachycardia, WBC elevated.  CT abdomen and pelvis showed cholangitis and liver abscess.  Started on empiric antibiotics.  Liver abscess drained by IR (due to ascites, percutaneous drain was not left this was just an aspiration), HIDA scan negative for cholecystitis.  Blood cultures growing Klebsiella (not ESBL) with string sign.  Urine culture with ESBL.  ID and CT surgery and EP consulted.  CT surgery recommend no valve repair EP recommend medical therapy and surveillance cultures after rather than lead removal now.  She had CT scan yesterday that showed reaccumulation of her previous abscess. -IR consult, to attempt percutaneous aspiration with drain placement today -Continue ceftriaxone and Flagyl -PICC in place -- >  OPAT ordered -Consult ID, GI, appreciate expert insights  -Will need GI follow up at discharge to arrange colonoscopy (given liver abscess) -ID post-discharge follow up in place    Acute metabolic encephalopathy Due to sepsis.  Now resolved  Transaminitis Due to liver abscess.  Hepatitis A antibody reactive, hep B core antibody reactive.  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   Insulin increased yesterday, glucose is better today. -Continue Lantus -Continue SS correction  GERD -Stop pantoprazole while on antibiotics  ESBL urine culture ID suspect this is colonization only.  Anemia of chronic disease No clinical signs of bleeding.     MDM and disposition: The below labs and imaging reports reviewed and summarized above.  Medication management as above.   The patient was admitted with sepsis from Klebsiella, with pneumonia, abscess and endocarditis.    She has now been started on appropriate antibiotics, her largest liver abscess has been drained once, and she will need repeat drainage today, hopefully with perc drain left.  If percutaneous drain can be placed, and outpatient parenteral antibiotics can be arranged, likely discharge home tomorrow.       DVT prophylaxis: Lovenox Code Status: FULL Family Communication:     Consultants:   GI  ID  CT surgery  EP  Procedures:   10/27 HIDA  10/30 TEE   Antimicrobials:        Subjective: Interval history collected through video phonic interpreter She has had no fever, headache, abdominal pain, chest pain, vomiting, confusion.  Her appetite is good.       Objective: Vitals:   10/14/19 1636 10/14/19 2117 10/15/19 0033 10/15/19 0352  BP: 103/73 (!) 112/55 (!) 118/49 98/66  Pulse: 84 76 74 86  Resp: (!) Temp: 100.2 F (37.9 C) 98.5 F (36.9 C)  98.3 F (36.8 C) 99.3 F (37.4 C)  TempSrc: Oral Oral Oral Oral  SpO2:  96% 95% 92%  Weight:      Height:        Intake/Output Summary (Last 24 hours) at 10/15/2019 1233 Last data filed at 10/15/2019 0350 Gross per 24 hour  Intake -  Output 550 ml  Net -550  ml   Filed Weights   10/07/19 0142 10/08/19 0641 10/12/19 1303  Weight: 45.2 kg 47.7 kg 47.7 kg    Examination: General appearance: Adult female, lying in bed, no acute distress HEENT: Anicteric, conjunctival pink, lids and lashes normal.  No nasal deformity, discharge, or epistaxis.  Lips moist, dentition normal, oropharynx moist, no oral lesions, hearing normal. Skin: Skin warm and dry, no suspicious redness or discharge around her picc Cardiac: RRR, no murmurs, JVP normal, no lower extremity edema. Respiratory: Normal respiratory rate and rhythm, lungs clear without rales or wheezes. Abdomen: Abdomen soft, no tenderness to palpation or guarding, no ascites or distention. MSK: Normal muscle bulk and tone. Neuro: Awake and alert, extraocular movements intact, moves all extremities with normal strength and coordination, speech seems fluent in [place] Psych: Sensorium intact responding questions, attention normal, affect normal, judgment insight appear normal.    Data Reviewed: I have personally reviewed following labs and imaging studies:  CBC: Recent Labs  Lab 10/11/19 0233 10/12/19 0235 10/13/19 0314 10/14/19 0518 10/15/19 0500  WBC 20.8* 22.2* 20.4* 17.7* 16.7*  HGB 11.9* 10.7* 10.4* 9.2* 9.0*  HCT 34.1* 31.3* 30.9* 27.4* 27.2*  MCV 88.3 90.2 91.4 92.6 92.5  PLT 98* 139* 209 267 311   Basic Metabolic Panel: Recent Labs  Lab 10/08/19 1603 10/09/19 0459  10/11/19 1645 10/12/19 0235 10/13/19 0314 10/14/19 0518 10/15/19 0500  NA 135 137   < > 139 140 136 135 135  K 3.7 4.0   < > 3.7 3.7 3.4* 3.6 3.2*  CL 111 115*   < > 110 111 103 105 104  CO2 12* 15*   < > 21* 21* 21* 24 25  GLUCOSE 205* 129*   < > 129* 128* 169* 155* 138*  BUN 23 25*   < > 15 9 6* 5* 6*  CREATININE 0.83 0.68   < > 0.73 0.61 0.61 0.54 0.49  CALCIUM 7.9* 7.8*   < > 7.6* 7.5* 7.6* 7.5* 7.5*  MG 1.8 1.9  --  1.7  --   --  1.6* 1.7   < > = values in this interval not displayed.   GFR:  Estimated Creatinine Clearance: 41 mL/min (by C-G formula based on SCr of 0.49 mg/dL). Liver Function Tests: Recent Labs  Lab 10/11/19 0233 10/12/19 0235 10/13/19 0314 10/14/19 0518 10/15/19 0500  AST 273* 267* 180* 85* 56*  ALT 370* 334* 285* 193* 151*  ALKPHOS 122 123 111 103 102  BILITOT 1.0 0.9 1.1 0.9 0.6  PROT 4.4* 4.3* 5.1* 5.0* 5.5*  ALBUMIN 1.5* 1.6* 1.6* 1.6* 1.8*   No results for input(s): LIPASE, AMYLASE in the last 168 hours. No results for input(s): AMMONIA in the last 168 hours. Coagulation Profile: Recent Labs  Lab 10/09/19 1102  INR 1.3*   Cardiac Enzymes: Recent Labs  Lab 10/09/19 0633  CKTOTAL 41   BNP (last 3 results) No results for input(s): PROBNP in the last 8760 hours. HbA1C: No results for input(s): HGBA1C in the last 72 hours. CBG: Recent Labs  Lab 10/14/19 0628 10/14/19 1118 10/14/19 1635 10/14/19 2118 10/15/19 2025  GLUCAP 184* 232* 252* 168* 131*   Lipid Profile: No results for input(s): CHOL, HDL, LDLCALC, TRIG, CHOLHDL, LDLDIRECT in the last 72 hours. Thyroid Function Tests: No results for input(s): TSH, T4TOTAL, FREET4, T3FREE, THYROIDAB in the last 72 hours. Anemia Panel: No results for input(s): VITAMINB12, FOLATE, FERRITIN, TIBC, IRON, RETICCTPCT in the last 72 hours. Urine analysis:    Component Value Date/Time   COLORURINE YELLOW 10/10/2019 0445   APPEARANCEUR CLEAR 10/10/2019 0445   LABSPEC 1.021 10/10/2019 0445   PHURINE 6.0 10/10/2019 0445   GLUCOSEU 50 (A) 10/10/2019 0445   HGBUR NEGATIVE 10/10/2019 0445   BILIRUBINUR NEGATIVE 10/10/2019 0445   BILIRUBINUR neg 05/05/2015 1038   KETONESUR 5 (A) 10/10/2019 0445   PROTEINUR NEGATIVE 10/10/2019 0445   UROBILINOGEN 0.2 05/05/2015 1038   NITRITE NEGATIVE 10/10/2019 0445   LEUKOCYTESUR NEGATIVE 10/10/2019 0445   Sepsis Labs: (procalcitonin:4,lacticacidven:4)  ) Recent Results (from the past 240 hour(s))  Blood Culture (routine x 2)     Status:  Abnormal   Collection Time: 10/06/19 12:40 PM   Specimen: BLOOD  Result Value Ref Range Status   Specimen Description BLOOD RIGHT ANTECUBITAL  Final   Special Requests   Final    BOTTLES DRAWN AEROBIC AND ANAEROBIC Blood Culture results may not be optimal due to an inadequate volume of blood received in culture bottles   Culture  Setup Time   Final    IN BOTH AEROBIC AND ANAEROBIC BOTTLES GRAM NEGATIVE RODS CRITICAL RESULT CALLED TO, READ BACK BY AND VERIFIED WITH: Evelena Peat [facility] 10/07/19 0419 JDW Performed at [facility], [address] [place], [place] [address]    Culture KLEBSIELLA PNEUMONIAE (A)  Final   Report Status 10/09/2019 FINAL  Final   Organism ID, Bacteria KLEBSIELLA PNEUMONIAE  Final      Susceptibility   Klebsiella pneumoniae - MIC*    AMPICILLIN >=32 RESISTANT Resistant     CEFAZOLIN <=4 SENSITIVE Sensitive     CEFEPIME <=1 SENSITIVE Sensitive     CEFTAZIDIME <=1 SENSITIVE Sensitive     CEFTRIAXONE <=1 SENSITIVE Sensitive     CIPROFLOXACIN <=0.25 SENSITIVE Sensitive     GENTAMICIN <=1 SENSITIVE Sensitive     IMIPENEM 0.5 SENSITIVE Sensitive     TRIMETH/SULFA <=20 SENSITIVE Sensitive     AMPICILLIN/SULBACTAM 8 SENSITIVE Sensitive     PIP/TAZO <=4 SENSITIVE Sensitive     Extended ESBL NEGATIVE Sensitive     * KLEBSIELLA PNEUMONIAE  Blood Culture ID Panel (Reflexed)     Status: Abnormal   Collection Time: 10/06/19 12:40 PM  Result Value Ref Range Status   Enterococcus species NOT DETECTED NOT DETECTED Final   Listeria monocytogenes NOT DETECTED NOT DETECTED Final   Staphylococcus species NOT DETECTED NOT DETECTED Final   Staphylococcus aureus (BCID) NOT DETECTED NOT DETECTED Final   Streptococcus species NOT DETECTED NOT DETECTED Final   Streptococcus agalactiae NOT DETECTED NOT DETECTED Final   Streptococcus pneumoniae NOT DETECTED NOT DETECTED Final   Streptococcus pyogenes NOT DETECTED NOT DETECTED Final   Acinetobacter baumannii NOT DETECTED NOT  DETECTED Final   Enterobacteriaceae species DETECTED (A) NOT DETECTED Final    Comment: Enterobacteriaceae represent a large family of gram-negative bacteria, not a single organism. CRITICAL RESULT CALLED TO, READ BACK BY AND VERIFIED WITH: G ABBOTT PHARMD 10/07/19 0419 JDW    Enterobacter cloacae complex NOT DETECTED NOT DETECTED Final   Escherichia coli NOT DETECTED NOT DETECTED Final   Klebsiella oxytoca NOT DETECTED NOT DETECTED  Final   Klebsiella pneumoniae DETECTED (A) NOT DETECTED Final    Comment: CRITICAL RESULT CALLED TO, READ BACK BY AND VERIFIED WITH: G ABBOTT PHARMD 10/07/19 0419 JDW    Proteus species NOT DETECTED NOT DETECTED Final   Serratia marcescens NOT DETECTED NOT DETECTED Final   Carbapenem resistance NOT DETECTED NOT DETECTED Final   Haemophilus influenzae NOT DETECTED NOT DETECTED Final   Neisseria meningitidis NOT DETECTED NOT DETECTED Final   Pseudomonas aeruginosa NOT DETECTED NOT DETECTED Final   Candida albicans NOT DETECTED NOT DETECTED Final   Candida glabrata NOT DETECTED NOT DETECTED Final   Candida krusei NOT DETECTED NOT DETECTED Final   Candida parapsilosis NOT DETECTED NOT DETECTED Final   Candida tropicalis NOT DETECTED NOT DETECTED Final    Comment: Performed at [facility], [address], [place], [place] [address]  Blood Culture (routine x 2)     Status: Abnormal   Collection Time: 10/06/19 12:45 PM   Specimen: BLOOD RIGHT HAND  Result Value Ref Range Status   Specimen Description BLOOD RIGHT HAND  Final   Special Requests   Final    BOTTLES DRAWN AEROBIC AND ANAEROBIC Blood Culture results may not be optimal due to an inadequate volume of blood received in culture bottles   Culture  Setup Time   Final    IN BOTH AEROBIC AND ANAEROBIC BOTTLES GRAM NEGATIVE RODS CRITICAL VALUE NOTED.  VALUE IS CONSISTENT WITH PREVIOUSLY REPORTED AND CALLED VALUE.    Culture (A)  Final    KLEBSIELLA PNEUMONIAE SUSCEPTIBILITIES PERFORMED ON PREVIOUS  CULTURE WITHIN THE LAST 5 DAYS. Performed at [facility], [address], [place], [place] [address]    Report Status 10/09/2019 FINAL  Final  SARS CORONAVIRUS 2 (TAT 6-24 HRS) Nasopharyngeal Nasopharyngeal Swab     Status: None   Collection Time: 10/06/19  3:35 PM   Specimen: Nasopharyngeal Swab  Result Value Ref Range Status   SARS Coronavirus 2 NEGATIVE NEGATIVE Final    Comment: (NOTE) SARS-CoV-2 target nucleic acids are NOT DETECTED. The SARS-CoV-2 RNA is generally detectable in upper and lower respiratory specimens during the acute phase of infection. Negative results do not preclude SARS-CoV-2 infection, do not rule out co-infections with other pathogens, and should not be used as the sole basis for treatment or other patient management decisions. Negative results must be combined with clinical observations, patient history, and epidemiological information. The expected result is Negative. Fact Sheet for Patients: HairSlick.no Fact Sheet for Healthcare Providers: quierodirigir.com This test is not yet approved or cleared by the [place] FDA and  has been authorized for detection and/or diagnosis of SARS-CoV-2 by FDA under an Emergency Use Authorization (EUA). This EUA will remain  in effect (meaning this test can be used) for the duration of the COVID-19 declaration under Section 56 4(b)(1) of the Act, 21 U.S.C. section 360bbb-3(b)(1), unless the authorization is terminated or revoked sooner. Performed at [facility], [address], [place], [place] [address]   Urine culture     Status: Abnormal   Collection Time: 10/06/19  4:02 PM   Specimen: In/Out Cath Urine  Result Value Ref Range Status   Specimen Description IN/OUT CATH URINE  Final   Special Requests   Final    NONE Performed at [facility], [address], [place], [place] [address]    Culture (A)  Final    30,000 COLONIES/mL  ESCHERICHIA COLI Confirmed Extended Spectrum Beta-Lactamase Producer (ESBL).  In bloodstream infections from ESBL organisms, carbapenems are preferred over piperacillin/tazobactam. They are shown to have a lower risk of mortality. 10,000 COLONIES/mL STAPHYLOCOCCUS EPIDERMIDIS    Report Status 10/08/2019 FINAL  Final   Organism ID, Bacteria ESCHERICHIA COLI (A)  Final   Organism ID, Bacteria STAPHYLOCOCCUS EPIDERMIDIS (A)  Final      Susceptibility   Escherichia coli - MIC*    AMPICILLIN >=32 RESISTANT Resistant     CEFAZOLIN >=64 RESISTANT Resistant     CEFTRIAXONE >=64 RESISTANT Resistant     CIPROFLOXACIN >=4 RESISTANT Resistant     GENTAMICIN <=1 SENSITIVE Sensitive     IMIPENEM <=0.25 SENSITIVE Sensitive     NITROFURANTOIN <=16 SENSITIVE Sensitive     TRIMETH/SULFA <=20 SENSITIVE Sensitive     AMPICILLIN/SULBACTAM 4 SENSITIVE Sensitive     PIP/TAZO <=4 SENSITIVE Sensitive     Extended ESBL POSITIVE Resistant     * 30,000 COLONIES/mL ESCHERICHIA COLI   Staphylococcus epidermidis - MIC*    CIPROFLOXACIN 2 INTERMEDIATE Intermediate     GENTAMICIN <=0.5 SENSITIVE Sensitive     NITROFURANTOIN <=16 SENSITIVE Sensitive     OXACILLIN >=4 RESISTANT Resistant     TETRACYCLINE <=1 SENSITIVE Sensitive     VANCOMYCIN 1 SENSITIVE Sensitive     TRIMETH/SULFA <=10 SENSITIVE Sensitive     CLINDAMYCIN <=0.25 SENSITIVE Sensitive     RIFAMPIN <=0.5 SENSITIVE Sensitive     Inducible Clindamycin NEGATIVE Sensitive     * 10,000 COLONIES/mL STAPHYLOCOCCUS EPIDERMIDIS  MRSA PCR Screening     Status: None   Collection Time: 10/08/19 10:50 PM   Specimen: Nasal Mucosa; Nasopharyngeal  Result Value Ref Range Status   MRSA by PCR NEGATIVE NEGATIVE Final    Comment:        The GeneXpert MRSA Assay (FDA approved for NASAL specimens only), is one component of a comprehensive MRSA colonization surveillance program. It is not intended to diagnose MRSA infection nor to guide or monitor treatment for  MRSA infections. Performed at [facility], [address], [place], [place] [address]   Body fluid culture     Status: None   Collection Time: 10/10/19 12:59 PM   Specimen: A: Abdomen; Peritoneal Fluid   B: Abdomen; Peritoneal Fluid  Result Value Ref Range Status   Specimen Description ABDOMEN  Final   Special Requests NONE  Final   Gram Stain   Final    FEW WBC PRESENT,BOTH PMN AND MONONUCLEAR NO ORGANISMS SEEN    Culture   Final    NO GROWTH Performed at [facility], [address], [place], [place] [address]    Report Status 10/13/2019 FINAL  Final  Aerobic/Anaerobic Culture (surgical/deep wound)     Status: None   Collection Time: 10/10/19  2:26 PM   Specimen: Abscess  Result Value Ref Range Status   Specimen Description ABSCESS LIVER  Final   Special Requests NONE  Final   Gram Stain   Final    ABUNDANT WBC PRESENT,BOTH PMN AND MONONUCLEAR FEW GRAM VARIABLE ROD RARE GRAM NEGATIVE RODS    Culture   Final    MODERATE KLEBSIELLA PNEUMONIAE NO ANAEROBES ISOLATED Performed at [facility], [address] [place], [place] [address]    Report Status 10/15/2019 FINAL  Final   Organism ID, Bacteria KLEBSIELLA PNEUMONIAE  Final      Susceptibility   Klebsiella pneumoniae - MIC*    AMPICILLIN >=32 RESISTANT Resistant     CEFAZOLIN <=4 SENSITIVE  Sensitive     CEFEPIME <=1 SENSITIVE Sensitive     CEFTAZIDIME <=1 SENSITIVE Sensitive     CEFTRIAXONE <=1 SENSITIVE Sensitive     CIPROFLOXACIN <=0.25 SENSITIVE Sensitive     GENTAMICIN <=1 SENSITIVE Sensitive     IMIPENEM <=0.25 SENSITIVE Sensitive     TRIMETH/SULFA <=20 SENSITIVE Sensitive     AMPICILLIN/SULBACTAM 4 SENSITIVE Sensitive     PIP/TAZO <=4 SENSITIVE Sensitive     Extended ESBL NEGATIVE Sensitive     * MODERATE KLEBSIELLA PNEUMONIAE  Culture, blood (routine x 2)     Status: None (Preliminary result)   Collection Time: 10/11/19  5:05 PM   Specimen: BLOOD RIGHT ARM  Result Value Ref Range  Status   Specimen Description BLOOD RIGHT ARM  Final   Special Requests AEROBIC BOTTLE ONLY Blood Culture adequate volume  Final   Culture   Final    NO GROWTH 4 DAYS Performed at [facility], [address] [place], [place] [address]    Report Status PENDING  Incomplete  Culture, blood (routine x 2)     Status: None (Preliminary result)   Collection Time: 10/11/19  5:05 PM   Specimen: BLOOD RIGHT ARM  Result Value Ref Range Status   Specimen Description BLOOD RIGHT ARM  Final   Special Requests AEROBIC BOTTLE ONLY Blood Culture adequate volume  Final   Culture   Final    NO GROWTH 4 DAYS Performed at [facility], [place] [address] [place], [place] [address]    Report Status PENDING  Incomplete         Radiology Studies: Ct Abdomen Pelvis W Contrast  Result Date: 10/14/2019 CLINICAL DATA:  Abnormal liver function tests. History of liver abscess, completely aspirated under ultrasound guidance on 10/02/2019. EXAM: CT ABDOMEN AND PELVIS WITH CONTRAST TECHNIQUE: Multidetector CT imaging of the abdomen and pelvis was performed using the standard protocol following bolus administration of intravenous contrast. CONTRAST:  165mL OMNIPAQUE IOHEXOL 300 MG/ML  SOLN COMPARISON:  10/07/2019 FINDINGS: Lower chest: Moderate-sized bilateral pleural effusions, increased. Progressive compressive atelectasis in both lower lobes. Mildly enlarged heart. Intracardiac pacer wires. Hepatobiliary: Interval re-accumulation of a an irregular rim enhancing fluid collection in the right lobe of the liver, measuring 3.5 x 3.0 cm on image number 14 series 3 and 3.3 cm in length on coronal image number 58. There is wedge-shaped heterogeneous low density in the right lobe posterior to this fluid collection as well as tubular low density extending medially to the proximal portion of the right hepatic vein. There is a smaller oval fluid collection more medially, measuring 1.0 cm on image number 14 series 3.  Normal appearing gallbladder. Pancreas: Unremarkable. No pancreatic ductal dilatation or surrounding inflammatory changes. Spleen: Normal in size without focal abnormality. Adrenals/Urinary Tract: Adrenal glands are unremarkable. Kidneys are normal, without renal calculi, focal lesion, or hydronephrosis. Bladder is unremarkable. Stomach/Bowel: Stomach is within normal limits. Appendix appears normal. No evidence of bowel wall thickening, distention, or inflammatory changes. Prominent stool in the colon. Vascular/Lymphatic: No significant vascular findings are present. No enlarged abdominal or pelvic lymph nodes. Reproductive: Uterus and bilateral adnexa are unremarkable. Other: Bilateral subcutaneous edema. No free peritoneal fluid or air. Musculoskeletal: Stable L3 and L4 vertebral body minimal compression deformities and endplate irregularity with multiple small Schmorl's nodes. Mild lower thoracic spine degenerative changes. IMPRESSION: 1. Interval re-accumulation of an irregular rim enhancing fluid collection in the right lobe of the liver, compatible with an abscess measuring 3.5  x 3.3 x 3.0 cm. 2. Additional smaller probable abscess in the right lobe of the liver, as described above. 3. Changes compatible with parenchymal infection in the right lobe of the liver with linear extension medially. 4. Moderate-sized bilateral pleural effusions, increased. 5. Progressive compressive atelectasis in both lower lobes. 6. Bilateral subcutaneous edema. Electronically Signed   By: Beckie SaltsSteven  Reid M.D.   On: 10/14/2019 12:34        Scheduled Meds: . chlorhexidine  15 mL Mouth Rinse BID  . Chlorhexidine Gluconate Cloth  6 each Topical Daily  . Chlorhexidine Gluconate Cloth  6 each Topical Q0600  . [START ON 10/16/2019] enoxaparin (LOVENOX) injection  30 mg Subcutaneous Q24H  . flecainide  50 mg Oral BID  . insulin aspart  0-15 Units Subcutaneous TID WC  . insulin aspart  0-5 Units Subcutaneous QHS  . insulin  glargine  12 Units Subcutaneous Daily  . mouth rinse  15 mL Mouth Rinse q12n4p  . metroNIDAZOLE  500 mg Oral Q8H  . sodium chloride flush  10-40 mL Intracatheter Q12H   Continuous Infusions: . sodium chloride Stopped (10/09/19 0959)  . cefTRIAXone (ROCEPHIN)  IV 2 g (10/14/19 2256)     LOS: 9 days    Time spent: 35 minutes    Alberteen Samhristopher P Monserrat Vidaurri, MD [facility] 10/15/2019, 12:33 PM     Please page through AMION:  [email or web address] Password [email or web address] If 7PM-7AM, please contact night-coverage

## 2019-10-15 NOTE — Evaluation (Signed)
Physical Therapy Evaluation Patient Details Name: Courtney [name] MRN: [id number] DOB: 1946/09/30 Today's Date: 10/15/2019   History of Present Illness  Patient is a 73 y/o female who presents with AMS. Found to have DKA, acute metabolic encephalopathy, sepsis from Klebsiella bacteremia resulting in multifocal pneumonia, liver abscess, valvular endocarditis and Hepatic abscess, s/p drain and paracentesis 10/28. 10/30 TEE consistent with vegetation. PMH includes DM, Afib and tachy/brady s/p PPM.  Clinical Impression  Patient presents with generalized weakness, impaired balance and impaired mobility s/p above. Pt independent PTA and lives with son. Today, pt requires Min guard-Min A for transfers and gait training with 1 complete LOB when turning to sit on bed requiring Max A to prevent fall. Pt would likely benefit from use of RW at home initially until balance improves. Encouraged walking with nursing a few times per day to improve overall strength/mobility. Will follow acutely to maximize independence and mobility prior to return home.    Follow Up Recommendations No PT follow up;Supervision for mobility/OOB    Equipment Recommendations  Other (comment)(possibly RW pending improvement)    Recommendations for Other Services       Precautions / Restrictions Precautions Precautions: Fall Restrictions Weight Bearing Restrictions: No      Mobility  Bed Mobility Overal bed mobility: Modified Independent             General bed mobility comments: No assist needed, HOB slightly elevated. Son attempting to assist.  Transfers Overall transfer level: Needs assistance Equipment used: None Transfers: Sit to/from Stand Sit to Stand: Min guard;Min assist         General transfer comment: Min guard-Min A for safety as pt unsteady in standing.  Ambulation/Gait Ambulation/Gait assistance: Min assist;Max assist Gait Distance (Feet): 200 Feet Assistive device: None Gait Pattern/deviations:  Step-through pattern;Decreased stride length;Drifts right/left;Narrow base of support Gait velocity: decreased   General Gait Details: Slow, mildly unsteady gait with Min A at times for support due to drifting and instability. 1 complete LOB when turning around to get to bed requiring Max A to prevent fall. VSS throughout.  Stairs            Wheelchair Mobility    Modified Rankin (Stroke Patients Only)       Balance Overall balance assessment: Needs assistance Sitting-balance support: Feet supported;No upper extremity supported Sitting balance-Leahy Scale: Good     Standing balance support: During functional activity Standing balance-Leahy Scale: Fair Standing balance comment: Close min guard due to balance deficits with dynamic tasks/turns.                             Pertinent Vitals/Pain Pain Assessment: No/denies pain    Home Living Family/patient expects to be discharged to:: Private residence Living Arrangements: Children(son) Available Help at Discharge: Family;Available 24 hours/day Type of Home: House Home Access: Level entry     Home Layout: Two level Home Equipment: None      Prior Function Level of Independence: Independent         Comments: Does her own ADLs/IADLs, cooks for entire family     Hand Dominance        Extremity/Trunk Assessment   Upper Extremity Assessment Upper Extremity Assessment: Defer to OT evaluation    Lower Extremity Assessment Lower Extremity Assessment: Generalized weakness(but functional)       Communication   Communication: Interpreter utilized(Interpreter Angie used)  Cognition Arousal/Alertness: Awake/alert Behavior During Therapy: WFL for tasks assessed/performed Overall  Cognitive Status: Within Functional Limits for tasks assessed                                 General Comments: A&Ox3. Seems WFl for basic tasks.      General Comments General comments (skin integrity,  edema, etc.): Son present during session. Answering most of questions for pt via the interpreter.    Exercises     Assessment/Plan    PT Assessment Patient needs continued PT services  PT Problem List Decreased strength;Decreased mobility;Decreased safety awareness;Decreased balance;Decreased knowledge of use of DME       PT Treatment Interventions Therapeutic activities;Gait training;Therapeutic exercise;Patient/family education;Balance training;Functional mobility training;Stair training;DME instruction    PT Goals (Current goals can be found in the Care Plan section)  Acute Rehab PT Goals Patient Stated Goal: to get pt home PT Goal Formulation: With patient/family Time For Goal Achievement: 10/29/19 Potential to Achieve Goals: Good    Frequency Min 3X/week   Barriers to discharge Inaccessible home environment stairs to get to bedroom    Co-evaluation               AM-PAC PT "6 Clicks" Mobility  Outcome Measure Help needed turning from your back to your side while in a flat bed without using bedrails?: None Help needed moving from lying on your back to sitting on the side of a flat bed without using bedrails?: None Help needed moving to and from a bed to a chair (including a wheelchair)?: A Little Help needed standing up from a chair using your arms (e.g., wheelchair or bedside chair)?: A Little Help needed to walk in hospital room?: A Little Help needed climbing 3-5 steps with a railing? : A Little 6 Click Score: 20    End of Session Equipment Utilized During Treatment: Gait belt Activity Tolerance: Patient tolerated treatment well Patient left: in bed;with call bell/phone within reach;with family/visitor present(sitting EOB with son present in room) Nurse Communication: Mobility status PT Visit Diagnosis: Unsteadiness on feet (R26.81);Muscle weakness (generalized) (M62.81);Difficulty in walking, not elsewhere classified (R26.2)    Time: 5916-3846 PT Time  Calculation (min) (ACUTE ONLY): 18 min   Charges:   PT Evaluation $PT Eval Moderate Complexity: 1 Mod          Marisa Severin, PT, DPT Acute Rehabilitation Services Pager [phone] Office [phone]      Marguarite Arbour A Sabra Heck 10/15/2019, 12:37 PM

## 2019-10-15 NOTE — Consult Note (Signed)
Chief Complaint: Patient was seen in consultation today for liver abscess/drain placement.  Referring Physician(s): Mansouraty, Telford Nab  Supervising Physician: Aletta Edouard  Patient Status: [facility] - In-pt  History of Present Illness: Courtney [name] is a 73 y.o. female with a past medical history of bradycardia/tachy-brady syndrome, paroxysmal atrial fibrillation on chronic anticoagulation with Eliquis, and diabetes mellitus type II. She presented to [facility] ED 10/06/2019 via EMS due to [place]. In ED, she was found to be in DKA and was admitted for further management. CT abdomen/pelvis 10/07/2019 revealed findings consistent with possible intrahepatic abscess. Surgery was consulted who recommended IR consultation for possible aspiration of this fluid collection. This was aspirated under US guidance 10/10/2019 by Dr. Anselm Pancoast, yielding 10 mL of tan purulent fluid. Repeat CT abdomen/pelvis 10/14/2019 revealed re-accumulation of intrahepatic fluid collection.  CT abdomen/pelvis 10/07/2019: 1. Small bilateral pleural effusions and findings of multifocal pneumonia. Clinical correlation is recommended. 2. Diffuse gallbladder wall edema and pericholecystic fluid. Additionally there is periportal edema and findings concerning for developing intrahepatic abscesses. Findings may represent cholangitis. Clinical correlation and further evaluation with MRCP is recommended. 3. Underdistention of the colon versus less likely mild colitis. Clinical correlation is recommended. No bowel obstruction. Normal appendix.  CT abdomen/pelvis 10/14/2019: 1. Interval re-accumulation of an irregular rim enhancing fluid collection in the right lobe of the liver, compatible with an abscess measuring 3.5 x 3.3 x 3.0 cm. 2. Additional smaller probable abscess in the right lobe of the liver, as described above. 3. Changes compatible with parenchymal infection in the right lobe of the liver with linear extension medially. 4. Moderate-sized  bilateral pleural effusions, increased. 5. Progressive compressive atelectasis in both lower lobes. 6. Bilateral subcutaneous edema.  IR consulted by Dr. Rush Landmark for possible image-guided liver abscess aspiration/drain placement. Patient awake and alert sitting on bed with no complaints at this time. Accompanied by Virgel Gess, at bedside. Denies fever, chills, chest pain, dyspnea, abdominal pain, or headache.  Currently receiving Lovenox 40 mg SQ injections Q24H, last dose 10/14/2019 per chart.   Past Medical History:  Diagnosis Date   Allergy    Atherosclerosis of aorta ([place]) 10/15/2019   Diabetes mellitus type 2, diet-controlled ([place])    PAF (paroxysmal atrial fibrillation) ([place]) 09/04/2017   Sinus bradycardia 09/04/2017   HR 30s at times   Tachy-brady syndrome ([place]) 09/04/2017    Past Surgical History:  Procedure Laterality Date   IR PARACENTESIS  10/10/2019   IR US GUIDE BX ASP/DRAIN  10/10/2019   PACEMAKER IMPLANT N/A 09/05/2017   Procedure: Pacemaker Implant;  Surgeon: Deboraha Sprang, MD;  Location: [facility];  Service: Cardiovascular;  Laterality: N/A;    Allergies: Patient has no known allergies.  Medications: Prior to Admission medications   Medication Sig Start Date End Date Taking? Authorizing Provider  acetaminophen (TYLENOL) 325 MG tablet Take 1-2 tablets (325-650 mg total) by mouth every 6 (six) hours as needed for mild pain or moderate pain. 09/06/17  Yes Baldwin Jamaica, PA-C  apixaban (ELIQUIS) 5 MG TABS tablet Take 1 tablet (5 mg total) by mouth 2 (two) times daily. 07/26/19  Yes Deboraha Sprang, MD  flecainide (TAMBOCOR) 50 MG tablet Take 1 tablet (50 mg total) by mouth 2 (two) times daily. 07/26/19  Yes Deboraha Sprang, MD  metoprolol tartrate (LOPRESSOR) 50 MG tablet Take 1 tablet (50 mg total) by mouth 2 (two) times daily. Please keep upcoming appt in September with Dr. Caryl Comes before anymore refills. Thank you  08/31/19  Yes Deboraha Sprang, MD  glucose monitoring kit (FREESTYLE) monitoring kit Check cbgs bid as instructed by pcp. 01/24/16   Shawnee Knapp, MD     Family History  Problem Relation Age of Onset   Diabetes Son     Social History   Socioeconomic History   Marital status: Single    Spouse name: Not on file   Number of children: Not on file   Years of education: Not on file   Highest education level: Not on file  Occupational History   Not on file  Social Needs   Financial resource strain: Not on file   Food insecurity    Worry: Not on file    Inability: Not on file   Transportation needs    Medical: Not on file    Non-medical: Not on file  Tobacco Use   Smoking status: Current Every Day Smoker   Smokeless tobacco: Never Used  Substance and Sexual Activity   Alcohol use: No    Frequency: Never   Drug use: No   Sexual activity: Not on file  Lifestyle   Physical activity    Days per week: Not on file    Minutes per session: Not on file   Stress: Not on file  Relationships   Social connections    Talks on phone: Not on file    Gets together: Not on file    Attends religious service: Not on file    Active member of club or organization: Not on file    Attends meetings of clubs or organizations: Not on file    Relationship status: Not on file  Other Topics Concern   Not on file  Social History Narrative   Not on file     Review of Systems: A 12 point ROS discussed and pertinent positives are indicated in the HPI above.  All other systems are negative.  Review of Systems  Constitutional: Negative for chills and fever.  Respiratory: Negative for shortness of breath and wheezing.   Cardiovascular: Negative for chest pain and palpitations.  Gastrointestinal: Negative for abdominal pain.  Neurological: Negative for headaches.  Psychiatric/Behavioral: Negative for behavioral problems and confusion.    Vital Signs: BP 98/66 (BP Location: Left Arm)    Pulse 86     Temp 99.3 F (37.4 C) (Oral)    Resp 18    Ht _0  (1.473 m)    Wt 105 lb 2.6 oz (47.7 kg)    SpO2 92%    BMI 21.98 kg/m   Physical Exam Vitals signs and nursing note reviewed.  Constitutional:      General: She is not in acute distress.    Appearance: Normal appearance.  Cardiovascular:     Rate and Rhythm: Normal rate and regular rhythm.     Heart sounds: Normal heart sounds. No murmur.  Pulmonary:     Effort: Pulmonary effort is normal. No respiratory distress.     Breath sounds: Normal breath sounds. No wheezing.  Skin:    General: Skin is warm and dry.  Neurological:     Mental Status: She is alert and oriented to person, place, and time.      MD Evaluation Airway: WNL Heart: WNL Abdomen: WNL Chest/ Lungs: WNL ASA  Classification: 3 Mallampati/Airway Score: Two   Imaging: Ct Head Wo Contrast  Result Date: 10/06/2019 CLINICAL DATA:  Altered level of consciousness and fever EXAM: CT HEAD WITHOUT CONTRAST TECHNIQUE: Contiguous axial images were  obtained from the base of the skull through the vertex without intravenous contrast. COMPARISON:  None. FINDINGS: Brain: The ventricles are normal in size and configuration. There is no intracranial mass, hemorrhage, extra-axial fluid collection, or midline shift. Brain parenchyma appears unremarkable. No acute infarct evident. Vascular: There is no hyperdense vessel. There is calcification in each carotid siphon region. Skull: The bony calvarium appears intact. Sinuses/Orbits: There is mucosal thickening in several ethmoid air cells. Other visualized paranasal sinuses are clear. Visualized orbits appear symmetric bilaterally. Other: Mastoid air cells are clear. IMPRESSION: Brain parenchyma appears unremarkable. No evident acute infarct. No mass or hemorrhage. There are foci of arterial vascular calcification. There is mucosal thickening in several ethmoid air cells. Electronically Signed   By: Lowella Grip III M.D.   On:  10/06/2019 14:55   Nm Hepatobiliary Liver Func  Result Date: 10/09/2019 CLINICAL DATA:  Upper abdominal pain and abnormal appearing gallbladder on recent ultrasound examination EXAM: NUCLEAR MEDICINE HEPATOBILIARY IMAGING TECHNIQUE: Sequential images of the abdomen were obtained out to 80 minutes following intravenous administration of radiopharmaceutical. RADIOPHARMACEUTICALS:  5.3 mCi Tc-52m Choletec IV COMPARISON:  Ultrasound right upper quadrant October 08, 2019 FINDINGS: Liver uptake of radiotracer is unremarkable. There is visualization of small bowel indicating patency of the common bile duct. Gallbladder visualizes, although somewhat delayed compared to visualization of the small bowel. IMPRESSION: There is visualization of the gallbladder which does indicate patency of the cystic duct. Relative delay in visualization of the gallbladder compared to small bowel may be a finding indicative of chronic cholecystitis. Common bile duct is patent as is evidenced by visualization of the small bowel. Comment: Note that radiotracer is seen in the right upper extremity due to apparent infiltration of intravenous access site earlier in the course of the study. Electronically Signed   By: WLowella GripIII M.D.   On: 10/09/2019 16:58   Ct Abdomen Pelvis W Contrast  Result Date: 10/14/2019 CLINICAL DATA:  Abnormal liver function tests. History of liver abscess, completely aspirated under ultrasound guidance on 10/02/2019. EXAM: CT ABDOMEN AND PELVIS WITH CONTRAST TECHNIQUE: Multidetector CT imaging of the abdomen and pelvis was performed using the standard protocol following bolus administration of intravenous contrast. CONTRAST:  107mOMNIPAQUE IOHEXOL 300 MG/ML  SOLN COMPARISON:  10/07/2019 FINDINGS: Lower chest: Moderate-sized bilateral pleural effusions, increased. Progressive compressive atelectasis in both lower lobes. Mildly enlarged heart. Intracardiac pacer wires. Hepatobiliary: Interval  re-accumulation of a an irregular rim enhancing fluid collection in the right lobe of the liver, measuring 3.5 x 3.0 cm on image number 14 series 3 and 3.3 cm in length on coronal image number 58. There is wedge-shaped heterogeneous low density in the right lobe posterior to this fluid collection as well as tubular low density extending medially to the proximal portion of the right hepatic vein. There is a smaller oval fluid collection more medially, measuring 1.0 cm on image number 14 series 3. Normal appearing gallbladder. Pancreas: Unremarkable. No pancreatic ductal dilatation or surrounding inflammatory changes. Spleen: Normal in size without focal abnormality. Adrenals/Urinary Tract: Adrenal glands are unremarkable. Kidneys are normal, without renal calculi, focal lesion, or hydronephrosis. Bladder is unremarkable. Stomach/Bowel: Stomach is within normal limits. Appendix appears normal. No evidence of bowel wall thickening, distention, or inflammatory changes. Prominent stool in the colon. Vascular/Lymphatic: No significant vascular findings are present. No enlarged abdominal or pelvic lymph nodes. Reproductive: Uterus and bilateral adnexa are unremarkable. Other: Bilateral subcutaneous edema. No free peritoneal fluid or air. Musculoskeletal: Stable L3 and  L4 vertebral body minimal compression deformities and endplate irregularity with multiple small Schmorl's nodes. Mild lower thoracic spine degenerative changes. IMPRESSION: 1. Interval re-accumulation of an irregular rim enhancing fluid collection in the right lobe of the liver, compatible with an abscess measuring 3.5 x 3.3 x 3.0 cm. 2. Additional smaller probable abscess in the right lobe of the liver, as described above. 3. Changes compatible with parenchymal infection in the right lobe of the liver with linear extension medially. 4. Moderate-sized bilateral pleural effusions, increased. 5. Progressive compressive atelectasis in both lower lobes. 6.  Bilateral subcutaneous edema. Electronically Signed   By: Claudie Revering M.D.   On: 10/14/2019 12:34   Ct Abdomen Pelvis W Contrast  Result Date: 10/08/2019 CLINICAL DATA:  73 year old female with bacteremia. EXAM: CT ABDOMEN AND PELVIS WITH CONTRAST TECHNIQUE: Multidetector CT imaging of the abdomen and pelvis was performed using the standard protocol following bolus administration of intravenous contrast. CONTRAST:  119m OMNIPAQUE IOHEXOL 300 MG/ML  SOLN COMPARISON:  None. FINDINGS: Evaluation of this exam is limited due to respiratory motion artifact. Lower chest: Partially visualized small bilateral pleural effusions with associated partial compressive atelectasis of the lower lobes. Multiple patchy areas of airspace consolidation in the visualized lung fields most consistent with multifocal pneumonia. Several peripheral and subpleural airspace opacity may have central cavitation (series 3, image 1) and may represent fungal infection, abscesses, or septic emboli. Clinical correlation is recommended. Cardiac pacemaker wires noted. No intra-abdominal free air. There is a small free fluid within the pelvis as well as a small perihepatic free fluid. Hepatobiliary: There is heterogeneous enhancement of the liver. Two adjacent hypoenhancing areas noted in the right lobe of the liver each measuring approximately 2.4 x 2.4 cm concerning for areas of infection or developing abscesses. There is mild periportal edema. There is diffuse thickening of the gallbladder wall with pericholecystic fluid. No definite calcified stone identified. Evaluation of the gallbladder is very limited due to respiratory motion artifact. Sludge or noncalcified stone may be present within the gallbladder. Further evaluation with ultrasound or MRCP is recommended. Pancreas: Unremarkable. No pancreatic ductal dilatation or surrounding inflammatory changes. Spleen: Normal in size without focal abnormality. Adrenals/Urinary Tract: The adrenal  glands are unremarkable. There is no hydronephrosis on either side. There is symmetric enhancement and excretion of contrast by both kidneys. The visualized ureters and urinary bladder appear unremarkable. Stomach/Bowel: There is no bowel obstruction. Mild diffuse thickened appearance of the colonic wall may be related to underdistention. Clinical correlation is recommended to evaluate for mild colitis. The appendix is normal. Vascular/Lymphatic: Mild aortoiliac atherosclerotic disease. The IVC is unremarkable. No portal venous gas. Mildly enlarged periportal lymph nodes, reactive. Reproductive: The uterus is grossly unremarkable. No adnexal masses. Other: There is diffuse subcutaneous edema. No fluid collection. Musculoskeletal: Degenerative changes of the spine. No acute osseous pathology. IMPRESSION: 1. Small bilateral pleural effusions and findings of multifocal pneumonia. Clinical correlation is recommended. 2. Diffuse gallbladder wall edema and pericholecystic fluid. Additionally there is periportal edema and findings concerning for developing intrahepatic abscesses. Findings may represent cholangitis. Clinical correlation and further evaluation with MRCP is recommended. 3. Underdistention of the colon versus less likely mild colitis. Clinical correlation is recommended. No bowel obstruction. Normal appendix. Aortic Atherosclerosis (ICD10-I70.0). Electronically Signed   By: AAnner CreteM.D.   On: 10/08/2019 00:18   [place]Abdomen Limited  Result Date: 10/08/2019 CLINICAL DATA:  Ascending cholangitis.  Abnormal CT. EXAM: ULTRASOUND ABDOMEN LIMITED RIGHT UPPER QUADRANT COMPARISON:  None. FINDINGS: Gallbladder: There is  gallbladder wall thickening with the gallbladder measuring approximately 3-4 mm. There is pericholecystic free fluid. Gallbladder sludge is noted. The sonographic Percell Miller sign was not reliably evaluated secondary to patient condition. Common bile duct: Diameter: 3 mm Liver: There is a 5 x 2.5  x 4.8 cm collection in the right hepatic lobe. This collection demonstrates no significant internal color Doppler flow. Portal vein is patent on color Doppler imaging with normal direction of blood flow towards the liver. Other: There is a right-sided pleural effusion. IMPRESSION: 1. Gallbladder sludge with pericholecystic free fluid and gallbladder wall thickening. Findings could represent acute cholecystitis in the appropriate clinical setting. The sonographic Percell Miller sign could not be reliably assessed. Further evaluation can be performed with HIDA scan as clinically indicated. 2. Complex collection in the right hepatic lobe as detailed above, again concerning for developing intrahepatic abscess in the appropriate clinical setting. 3. Incidentally noted right-sided pleural effusion. Electronically Signed   By: Constance Holster M.D.   On: 10/08/2019 21:02   Ir US Guide Bx Asp/drain  Result Date: 10/10/2019 INDICATION: 73 year old with bacteremia and concern for cholangitis and hepatic abscess. Cystic duct was patent on recent HIDA examination. EXAM: ULTRASOUND-GUIDED PARACENTESIS ULTRASOUND-GUIDED HEPATIC ABSCESS ASPIRATION MEDICATIONS: The patient is currently admitted to the hospital and receiving intravenous antibiotics. ANESTHESIA/SEDATION: Fentanyl 12.5 mcg IV; Versed 0.5 mg IV Moderate Sedation Time:  12 minutes The patient was continuously monitored during the procedure by the interventional radiology nurse under my direct supervision. COMPLICATIONS: None immediate. PROCEDURE: Informed written consent was obtained from the patient's son after a thorough discussion of the procedural risks, benefits and alternatives. All questions were addressed. Maximal Sterile Barrier Technique was utilized including caps, mask, sterile gowns, sterile gloves, sterile drape, hand hygiene and skin antiseptic. A timeout was performed prior to the initiation of the procedure. Patient was placed supine on the  interventional table. Ultrasound was used to evaluate the abdomen. New perihepatic ascites was identified. Skin was anesthetized with 1% lidocaine. Using ultrasound guidance, a 19 gauge Yueh catheter was directed into the perihepatic space and a paracentesis was performed. The paracentesis catheter was kept in place as attention was directed to the intrahepatic lesion. Ultrasound demonstrated irregular hypoechoic areas in liver concerning for intrahepatic abscess. After the perihepatic ascites had been removed, a new 19 gauge Yueh catheter was directed into the liver and into the hypoechoic hepatic area with ultrasound guidance. Tan colored purulent fluid was aspirated. Approximately 10 mL of purulent fluid was removed from the liver. Following the aspiration, there was no significant collection remaining. Stiff Amplatz wire was advanced into the liver but with the wire was not forming within a well-defined collection. Drain was not placed. Yueh catheter was removed. Small amount of bloody material was removed from the paracentesis catheter prior to removal. Bandage placed over the puncture sites. FINDINGS: New perihepatic ascites. Approximately 220 mL of predominantly yellow fluid was removed from the perihepatic space. Small amount of blood was aspirated from the paracentesis catheter after the liver aspiration. Small irregular hypoechoic collections in the liver. Approximately 10 mL of purulent fluid was removed from these collections and findings are compatible with a poorly formed hepatic abscess. The hepatic abscess was decompressed following aspiration. IMPRESSION: 1. Ultrasound-guided aspiration of the intrahepatic abscess/abscesses. Total of 10 mL of purulent fluid was removed. There was not a well-defined collection within the liver based on wire placement. In addition, the liver abscess appeared to be completely decompressed by the end of the procedure. Therefore, hepatic drain was  not placed. In  addition, there is concern about placing an intrahepatic drain in the setting of ascites. Abscess fluid was sent for culture. 2. Ultrasound-guided paracentesis. Approximately 220 mL of peritoneal fluid was removed. Fluid was sent for analysis. Electronically Signed   By: Markus Daft M.D.   On: 10/10/2019 15:12   Dg Chest Port 1 View  Result Date: 10/08/2019 CLINICAL DATA:  Hypoxia and shortness of breath. EXAM: PORTABLE CHEST 1 VIEW COMPARISON:  10/07/2019 and 10/06/2019 FINDINGS: Left-sided pacemaker unchanged. Lungs are adequately inflated demonstrate continued mild patchy bilateral airspace process with slight worsening over the left midlung and left upper lobe. No effusion. Cardiomediastinal silhouette and remainder of the exam is unchanged. IMPRESSION: Slight interval worsening bilateral patchy airspace process likely multifocal infection. Electronically Signed   By: Marin Olp M.D.   On: 10/08/2019 16:00   Dg Chest Port 1 View  Result Date: 10/07/2019 CLINICAL DATA:  Shortness of breath EXAM: PORTABLE CHEST 1 VIEW COMPARISON:  10/06/2019 FINDINGS: The heart size and mediastinal contours are within normal limits. Left chest multi lead pacer. Increased multifocal heterogeneous opacity of the bilateral mid lungs and perihilar right lung. The visualized skeletal structures are unremarkable. IMPRESSION: Increased multifocal heterogeneous opacity of the bilateral mid lungs and perihilar right lung, concerning for multifocal infection. Electronically Signed   By: Eddie Candle M.D.   On: 10/07/2019 19:26   Dg Chest Port 1 View  Result Date: 10/06/2019 CLINICAL DATA:  Altered mental status EXAM: PORTABLE CHEST 1 VIEW COMPARISON:  09/06/2017 FINDINGS: There is no focal consolidation. There is no pleural effusion or pneumothorax. The heart and mediastinal contours are unremarkable. 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 There is thoracic aortic atherosclerosis. There is no acute osseous abnormality.  IMPRESSION: No active disease. Electronically Signed   By: Kathreen Devoid   On: 10/06/2019 12:49   [place] Ekg Site Rite  Result Date: 10/12/2019 If Site Rite image not attached, placement could not be confirmed due to current cardiac rhythm.  [place] Ekg Site Rite  Result Date: 10/07/2019 If Site Rite image not attached, placement could not be confirmed due to current cardiac rhythm.  Ir Paracentesis  Result Date: 10/10/2019 INDICATION: 73 year old with bacteremia and concern for cholangitis and hepatic abscess. Cystic duct was patent on recent HIDA examination. EXAM: ULTRASOUND-GUIDED PARACENTESIS ULTRASOUND-GUIDED HEPATIC ABSCESS ASPIRATION MEDICATIONS: The patient is currently admitted to the hospital and receiving intravenous antibiotics. ANESTHESIA/SEDATION: Fentanyl 12.5 mcg IV; Versed 0.5 mg IV Moderate Sedation Time:  12 minutes The patient was continuously monitored during the procedure by the interventional radiology nurse under my direct supervision. COMPLICATIONS: None immediate. PROCEDURE: Informed written consent was obtained from the patient's son after a thorough discussion of the procedural risks, benefits and alternatives. All questions were addressed. Maximal Sterile Barrier Technique was utilized including caps, mask, sterile gowns, sterile gloves, sterile drape, hand hygiene and skin antiseptic. A timeout was performed prior to the initiation of the procedure. Patient was placed supine on the interventional table. Ultrasound was used to evaluate the abdomen. New perihepatic ascites was identified. Skin was anesthetized with 1% lidocaine. Using ultrasound guidance, a 19 gauge Yueh catheter was directed into the perihepatic space and a paracentesis was performed. The paracentesis catheter was kept in place as attention was directed to the intrahepatic lesion. Ultrasound demonstrated irregular hypoechoic areas in liver concerning for intrahepatic abscess. After the perihepatic ascites had been  removed, a new 19 gauge Yueh catheter was directed into the liver and into the hypoechoic  hepatic area with ultrasound guidance. Tan colored purulent fluid was aspirated. Approximately 10 mL of purulent fluid was removed from the liver. Following the aspiration, there was no significant collection remaining. Stiff Amplatz wire was advanced into the liver but with the wire was not forming within a well-defined collection. Drain was not placed. Yueh catheter was removed. Small amount of bloody material was removed from the paracentesis catheter prior to removal. Bandage placed over the puncture sites. FINDINGS: New perihepatic ascites. Approximately 220 mL of predominantly yellow fluid was removed from the perihepatic space. Small amount of blood was aspirated from the paracentesis catheter after the liver aspiration. Small irregular hypoechoic collections in the liver. Approximately 10 mL of purulent fluid was removed from these collections and findings are compatible with a poorly formed hepatic abscess. The hepatic abscess was decompressed following aspiration. IMPRESSION: 1. Ultrasound-guided aspiration of the intrahepatic abscess/abscesses. Total of 10 mL of purulent fluid was removed. There was not a well-defined collection within the liver based on wire placement. In addition, the liver abscess appeared to be completely decompressed by the end of the procedure. Therefore, hepatic drain was not placed. In addition, there is concern about placing an intrahepatic drain in the setting of ascites. Abscess fluid was sent for culture. 2. Ultrasound-guided paracentesis. Approximately 220 mL of peritoneal fluid was removed. Fluid was sent for analysis. Electronically Signed   By: Markus Daft M.D.   On: 10/10/2019 15:12    Labs:  CBC: Recent Labs    10/12/19 0235 10/13/19 0314 10/14/19 0518 10/15/19 0500  WBC 22.2* 20.4* 17.7* 16.7*  HGB 10.7* 10.4* 9.2* 9.0*  HCT 31.3* 30.9* 27.4* 27.2*  PLT 139* 209 267  311    COAGS: Recent Labs    10/06/19 1240 10/08/19 1215 10/09/19 1102  INR 1.2 1.3* 1.3*  APTT 38*  --   --     BMP: Recent Labs    10/12/19 0235 10/13/19 0314 10/14/19 0518 10/15/19 0500  NA 140 136 135 135  K 3.7 3.4* 3.6 3.2*  CL 111 103 105 104  CO2 21* 21* 24 25  GLUCOSE 128* 169* 155* 138*  BUN 9 6* 5* 6*  CALCIUM 7.5* 7.6* 7.5* 7.5*  CREATININE 0.61 0.61 0.54 0.49  GFRNONAA >60 >60 >60 >60  GFRAA >60 >60 >60 >60    LIVER FUNCTION TESTS: Recent Labs    10/12/19 0235 10/13/19 0314 10/14/19 0518 10/15/19 0500  BILITOT 0.9 1.1 0.9 0.6  AST 267* 180* 85* 56*  ALT 334* 285* 193* 151*  ALKPHOS 123 111 103 102  PROT 4.3* 5.1* 5.0* 5.5*  ALBUMIN 1.6* 1.6* 1.6* 1.8*     Assessment and Plan:  Liver abscess. Plan for image-guided liver abscess aspiration/drain placement tentatively for today in IR. Patient is NPO. Afebrile. Last dose Lovenox 10/14/2019- ok to proceed per Dr. Kathlene Cote. INR 1.3 10/09/2019.  Risks and benefits discussed with the patient including bleeding, infection, damage to adjacent structures, bowel perforation/fistula connection, and sepsis. All of the patient's questions were answered, patient is agreeable to proceed. Consent signed and in chart- patient's son, Brynda Rim, was present for consent, and interpretor [phone] was used for interaction.    Thank you for this interesting consult.  I greatly enjoyed meeting Delenn H Hardge and look forward to participating in their care.  A copy of this report was sent to the requesting provider on this date.  Electronically Signed: Earley Abide, PA-C 10/15/2019, 11:14 AM   I spent a total of 40  Minutes in face to face in clinical consultation, greater than 50% of which was counseling/coordinating care for liver abscess/drain placement.

## 2019-10-15 NOTE — Progress Notes (Signed)
Progress Note  Patient Name: Courtney [name] H Mally Date of Encounter: 10/15/2019  Primary Cardiologist: Chilton Siiffany Hinds, MD   Subjective   Feeling well.  Denies chest pain.  She notes her breathing is heavy but denies orthopnea.  Walked with PT and felt weak.   Inpatient Medications    Scheduled Meds:  chlorhexidine  15 mL Mouth Rinse BID   Chlorhexidine Gluconate Cloth  6 each Topical Daily   Chlorhexidine Gluconate Cloth  6 each Topical Q0600   [START ON 10/16/2019] enoxaparin (LOVENOX) injection  30 mg Subcutaneous Q24H   flecainide  50 mg Oral BID   insulin aspart  0-15 Units Subcutaneous TID WC   insulin aspart  0-5 Units Subcutaneous QHS   insulin glargine  12 Units Subcutaneous Daily   mouth rinse  15 mL Mouth Rinse q12n4p   metroNIDAZOLE  500 mg Oral Q8H   sodium chloride flush  10-40 mL Intracatheter Q12H   Continuous Infusions:  sodium chloride Stopped (10/09/19 0959)   cefTRIAXone (ROCEPHIN)  IV 2 g (10/14/19 2256)   PRN Meds: sodium chloride, acetaminophen, dextrose, sodium chloride flush   Vital Signs    Vitals:   10/14/19 1636 10/14/19 2117 10/15/19 0033 10/15/19 0352  BP: 103/73 (!) 112/55 (!) 118/49 98/66  Pulse: 84 76 74 86  Resp: (!) 22 19 20 18   Temp: 100.2 F (37.9 C) 98.5 F (36.9 C) 98.3 F (36.8 C) 99.3 F (37.4 C)  TempSrc: Oral Oral Oral Oral  SpO2:  96% 95% 92%  Weight:      Height:        Intake/Output Summary (Last 24 hours) at 10/15/2019 0849 Last data filed at 10/15/2019 0350 Gross per 24 hour  Intake --  Output 550 ml  Net -550 ml   Last 3 Weights 10/12/2019 10/08/2019 10/07/2019  Weight (lbs) 105 lb 2.6 oz 105 lb 2.6 oz 99 lb 10.4 oz  Weight (kg) 47.7 kg 47.7 kg 45.2 kg      Telemetry    Sinus rhythm.  No events.   - Personally Reviewed  ECG    n/a - Personally Reviewed  Physical Exam   VS:  BP 98/66 (BP Location: Left Arm)    Pulse 86    Temp 99.3 F (37.4 C) (Oral)    Resp 18    Ht 4\' 10"  (1.473 m)    Wt 47.7  kg    SpO2 92%    BMI 21.98 kg/m  , BMI Body mass index is 21.98 kg/m. GENERAL:  Well appearing.  No acute distress.  HEENT: Pupils equal round and reactive, fundi not visualized, oral mucosa unremarkable NECK:  No jugular venous distention, waveform within normal limits, carotid upstroke brisk and symmetric, no bruits LUNGS:  Clear to auscultation bilaterally HEART:  RRR.  PMI not displaced or sustained,S1 and S2 within normal limits, no S3, no S4, no clicks, no rubs, no murmurs ABD:  Flat, positive bowel sounds normal in frequency in pitch, no bruits, no rebound, no guarding, no midline pulsatile mass, no hepatomegaly, no splenomegaly EXT:  2 plus pulses throughout, no edema, no cyanosis no clubbing.  R UE PICC line SKIN:  No rashes no nodules NEURO:  Cranial nerves II through XII grossly intact, motor grossly intact throughout PSYCH:  Cognitively intact, oriented to person place and time   Labs    High Sensitivity Troponin:  No results for input(s): TROPONINIHS in the last 720 hours.    Chemistry Recent Labs  Lab 10/13/19  7353 10/14/19 0518 10/15/19 0500  NA 136 135 135  K 3.4* 3.6 3.2*  CL 103 105 104  CO2 21* 24 25  GLUCOSE 169* 155* 138*  BUN 6* 5* 6*  CREATININE 0.61 0.54 0.49  CALCIUM 7.6* 7.5* 7.5*  PROT 5.1* 5.0* 5.5*  ALBUMIN 1.6* 1.6* 1.8*  AST 180* 85* 56*  ALT 285* 193* 151*  ALKPHOS 111 103 102  BILITOT 1.1 0.9 0.6  GFRNONAA >60 >60 >60  GFRAA >60 >60 >60  ANIONGAP 12 6 6      Hematology Recent Labs  Lab 10/13/19 0314 10/14/19 0518 10/15/19 0500  WBC 20.4* 17.7* 16.7*  RBC 3.38* 2.96* 2.94*  HGB 10.4* 9.2* 9.0*  HCT 30.9* 27.4* 27.2*  MCV 91.4 92.6 92.5  MCH 30.8 31.1 30.6  MCHC 33.7 33.6 33.1  RDW 13.2 13.3 13.2  PLT 209 267 311    BNPNo results for input(s): BNP, PROBNP in the last 168 hours.   DDimer No results for input(s): DDIMER in the last 168 hours.   Radiology    Ct Abdomen Pelvis W Contrast  Result Date: 10/14/2019 CLINICAL  DATA:  Abnormal liver function tests. History of liver abscess, completely aspirated under ultrasound guidance on 10/02/2019. EXAM: CT ABDOMEN AND PELVIS WITH CONTRAST TECHNIQUE: Multidetector CT imaging of the abdomen and pelvis was performed using the standard protocol following bolus administration of intravenous contrast. CONTRAST:  10/04/2019 OMNIPAQUE IOHEXOL 300 MG/ML  SOLN COMPARISON:  10/07/2019 FINDINGS: Lower chest: Moderate-sized bilateral pleural effusions, increased. Progressive compressive atelectasis in both lower lobes. Mildly enlarged heart. Intracardiac pacer wires. Hepatobiliary: Interval re-accumulation of a an irregular rim enhancing fluid collection in the right lobe of the liver, measuring 3.5 x 3.0 cm on image number 14 series 3 and 3.3 cm in length on coronal image number 58. There is wedge-shaped heterogeneous low density in the right lobe posterior to this fluid collection as well as tubular low density extending medially to the proximal portion of the right hepatic vein. There is a smaller oval fluid collection more medially, measuring 1.0 cm on image number 14 series 3. Normal appearing gallbladder. Pancreas: Unremarkable. No pancreatic ductal dilatation or surrounding inflammatory changes. Spleen: Normal in size without focal abnormality. Adrenals/Urinary Tract: Adrenal glands are unremarkable. Kidneys are normal, without renal calculi, focal lesion, or hydronephrosis. Bladder is unremarkable. Stomach/Bowel: Stomach is within normal limits. Appendix appears normal. No evidence of bowel wall thickening, distention, or inflammatory changes. Prominent stool in the colon. Vascular/Lymphatic: No significant vascular findings are present. No enlarged abdominal or pelvic lymph nodes. Reproductive: Uterus and bilateral adnexa are unremarkable. Other: Bilateral subcutaneous edema. No free peritoneal fluid or air. Musculoskeletal: Stable L3 and L4 vertebral body minimal compression deformities and  endplate irregularity with multiple small Schmorl's nodes. Mild lower thoracic spine degenerative changes. IMPRESSION: 1. Interval re-accumulation of an irregular rim enhancing fluid collection in the right lobe of the liver, compatible with an abscess measuring 3.5 x 3.3 x 3.0 cm. 2. Additional smaller probable abscess in the right lobe of the liver, as described above. 3. Changes compatible with parenchymal infection in the right lobe of the liver with linear extension medially. 4. Moderate-sized bilateral pleural effusions, increased. 5. Progressive compressive atelectasis in both lower lobes. 6. Bilateral subcutaneous edema. Electronically Signed   By: 10/09/2019 M.D.   On: 10/14/2019 12:34    Cardiac Studies   TTE  09/05/17 Study Conclusions  - Left ventricle: The cavity size was normal. Wall thickness was normal. Systolic function  was normal. The estimated ejection fraction was in the range of 60% to 65%. Wall motion was normal; there were no regional wall motion abnormalities. Features are consistent with a pseudonormal left ventricular filling pattern, with concomitant abnormal relaxation and increased filling pressure (grade 2 diastolic dysfunction). - Aortic valve: There was no stenosis. - Mitral valve: There was trivial regurgitation. - Left atrium: The atrium was mildly dilated. - Right ventricle: The cavity size was normal. Systolic function was normal. - Tricuspid valve: Peak RV-RA gradient (S): 26 mm Hg. - Pulmonary arteries: PA peak pressure: 29 mm Hg (S). - Inferior vena cava: The vessel was normal in size. The respirophasic diameter changes were in the normal range (>= 50%), consistent with normal central venous pressure.  Impressions:  - Normal LV size with EF 60-65%. Normal RV size and systolic function. No significant valvular abnormalities.   Patient Profile     73 y.o. female with PAF, diabetes, SSS s/p St[employer] PPM admitted with AMS,  Klebsiella bacteremia, liver abscess and possible mitral valve vegetation.  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  No clear vegetation on PPM leads, though there was stranding.  No significant regurgitation or valve destruction.  She remains on IV antibiotics.  Plan to treat with IV antibiotics and no surgery/lead extraction at this time.  Appreciate ID following.  Plan for 6 weeks of antibiotics.   # Liver abscess:  S/p drainage by IR.   # PAF: She had afib with RVR on admission.  She is on flecainide.  No anticoagulation 2/2 recent liver drain.  Beta blocker was stopped 2/2 hypotension.  Resume Eliquis when stable per IR.  # Atherosclerotic plaque: Moderate plaque noted in the aorta.  Resume Eliquis when able as above, so no aspirin.  Will check lipids.  LDL goal <70.  Will start statin when LFTs normalize.       For questions or updates, please contact [facility] Please consult [email or web address] for contact info under        Signed, Skeet Latch, MD  10/15/2019, 8:49 AM

## 2019-10-15 NOTE — Progress Notes (Signed)
Nutrition Follow up  DOCUMENTATION CODES:   Not applicable  INTERVENTION:    Ensure Enlive po BID, each supplement provides 350 kcal and 20 grams of protein  MVI daily   NUTRITION DIAGNOSIS:   Inadequate oral intake related to acute illness, poor appetite, nausea, altered GI function as evidenced by NPO status, per patient/family report.  Diet advanced   GOAL:   Patient will meet greater than or equal to 90% of their needs   Progressing slightly  MONITOR:   PO intake, Supplement acceptance, Diet advancement, Labs, Weight trends  REASON FOR ASSESSMENT:   Malnutrition Screening Tool    ASSESSMENT:   73 yo female admitted with DKA, AMS, sepsis with bacteremia, acute cholangitis with possible intrahepatic abscess. PMH includes DM, PAF, pacemaker placement. Pt speaks Vitenamese  10/26 rapid response 10/28- s/p IR drainage of abscess  CT scan yesterday showed recurrent accumulation of previous abscess. For another drain placement today.   Unable to speak with pt or perform NFPE as pt out of room at time of RD visit. Diet advanced to regular 10/30. No meal completions charted in three days. Per nurse, pt NPO today for procedure. Unsure of intake prior. RD to provide supplements to maximize kcal and protein this admission.  Admission weight: 45.2 kg  Current weight: 47.7 kg   Medications: SS novolog, lantus Labs: K 3.2 (L) CBG [id number]   Diet Order:   Diet Order            Diet NPO time specified  Diet effective midnight              EDUCATION NEEDS:   Not appropriate for education at this time  Skin:  Skin Assessment: Skin Integrity Issues: Skin Integrity Issues:: Incisions Incisions: R abdomen  Last BM:  10/30  Height:   Ht Readings from Last 1 Encounters:  10/14/19 4\' 10"  (1.473 m)    Weight:   Wt Readings from Last 1 Encounters:  10/12/19 47.7 kg    Ideal Body Weight:  47.7 kg  BMI:  Body mass index is 21.98 kg/m.  Estimated  Nutritional Needs:   Kcal:  1600-1800 kcals  Protein:  80-90 g  Fluid:  >/= 1.6 L  Mariana Single RD, LDN Clinical Nutrition Pager # [phone]

## 2019-10-15 NOTE — Progress Notes (Signed)
[facility]  Date of Admission:  10/06/2019      Total days of antibiotics 10  Day 5 ceftriaxone             ASSESSMENT: Courtney [name] is a 73 y.o. 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   Liver abscess - repeat CT yesterday reveals interval re-accumulation of irregular rim enhancing fluid collection measuring 3.5 x 3.3 x 3.0 cm -- agree with placement of percutaneous drain placement given now more well-defined process that would benefit from more source control.   She will follow up in ID office in 3-4 weeks to see how she is doing. Will plan on checking surveillance blood cultures follow full 6 week course.   PICC in place and looks good. Discussed with her husband that home health team will teach him how to do injections at home once daily.    PLAN: 1. OPAT as detailed below  2. IR to evaluate patient with tentative drain to be placed today    OPAT ORDERS:  Diagnosis: Disseminated Klebsiella Pneumoniae - liver abscess and mitral/aortic valve endocarditis   Culture Result: Klebsiella pneumoniae (hypervirulent strain)   No Known Allergies  Discharge antibiotics: Ceftriaxone 2 gm IV QD   Duration: 6 weeks   End Date: 11/26/19  [facility] Care and Maintenance Per Protocol _x_ Please pull PIC at completion of IV antibiotics __ Please leave PIC in place until doctor has seen patient or been notified  Labs weekly while on IV antibiotics: _x_ CBC with differential __ BMP __ BMP TWICE WEEKLY** _x_ CMP __ CRP __ ESR __ Vancomycin trough  Fax weekly labs to [phone]  Clinic Follow Up Appt: Janene Madeira, NP @ 11/20/19 10:15 a.m.    Principal Problem:   Bacteremia due to Klebsiella pneumoniae, hypervirulent pathogen Active  Problems:   Endocarditis   Liver abscess   Type 2 NIDDM-untreated   Chronic obstructive pulmonary disease (HCC)   Paroxysmal atrial fibrillation (HCC)   Pacemaker   Altered mental status   Klebsiella infection   Atherosclerosis of aorta ([place])   . chlorhexidine  15 mL Mouth Rinse BID  . Chlorhexidine Gluconate Cloth  6 each Topical Daily  . Chlorhexidine Gluconate Cloth  6 each Topical Q0600  . [START ON 10/16/2019] enoxaparin (LOVENOX) injection  30 mg Subcutaneous Q24H  . flecainide  50 mg Oral BID  . insulin aspart  0-15 Units Subcutaneous TID WC  . insulin aspart  0-5 Units Subcutaneous QHS  . insulin glargine  12 Units Subcutaneous Daily  . mouth rinse  15 mL Mouth Rinse q12n4p  . metroNIDAZOLE  500 mg Oral Q8H  . sodium chloride flush  10-40 mL Intracatheter Q12H    SUBJECTIVE: No complaints per her husband.   Interval additions -  Re-accumulation of abscess noted on CT scan. Previously underwent aspiration d/t ascites.   Review of Systems: Review of Systems  Constitutional: Negative for chills and fever.  Respiratory: Positive for cough. Negative for sputum production and shortness of breath.   Cardiovascular: Negative for chest pain and leg swelling.  Gastrointestinal: Negative for abdominal pain, diarrhea and vomiting.  Genitourinary: Negative for dysuria.  Skin: Negative for itching and rash.    No Known Allergies  OBJECTIVE: Vitals:   10/14/19 1636  10/14/19 2117 10/15/19 0033 10/15/19 0352  BP: 103/73 (!) 112/55 (!) 118/49 98/66  Pulse: 84 76 74 86  Resp: (!) '22 19 20 18  '$ Temp: 100.2 F (37.9 C) 98.5 F (36.9 C) 98.3 F (36.8 C) 99.3 F (37.4 C)  TempSrc: Oral Oral Oral Oral  SpO2:  96% 95% 92%  Weight:      Height:       Body mass index is 21.98 kg/m.  Physical Exam Vitals signs and nursing note reviewed.  Constitutional:      Appearance: She is well-developed.     Comments: Resting quietly in bed. No distress. Husband at the bedside.    HENT:     Mouth/Throat:     Mouth: No oral lesions.     Dentition: Normal dentition. No dental abscesses.     Pharynx: No oropharyngeal exudate.  Cardiovascular:     Rate and Rhythm: Normal rate and regular rhythm.     Heart sounds: Normal heart sounds.  Pulmonary:     Effort: Pulmonary effort is normal.     Breath sounds: Normal breath sounds.  Abdominal:     General: There is no distension.     Palpations: Abdomen is soft.     Tenderness: There is no abdominal tenderness.  Lymphadenopathy:     Cervical: No cervical adenopathy.  Skin:    General: Skin is warm and dry.     Findings: No rash.     Comments: RUE PICC line clean/dry   Neurological:     Mental Status: She is alert and oriented to person, place, and time.  Psychiatric:        Judgment: Judgment normal.     Lab Results Lab Results  Component Value Date   WBC 16.7 (H) 10/15/2019   HGB 9.0 (L) 10/15/2019   HCT 27.2 (L) 10/15/2019   MCV 92.5 10/15/2019   PLT 311 10/15/2019    Lab Results  Component Value Date   CREATININE 0.49 10/15/2019   BUN 6 (L) 10/15/2019   NA 135 10/15/2019   K 3.2 (L) 10/15/2019   CL 104 10/15/2019   CO2 25 10/15/2019    Lab Results  Component Value Date   ALT 151 (H) 10/15/2019   AST 56 (H) 10/15/2019   ALKPHOS 102 10/15/2019   BILITOT 0.6 10/15/2019     Microbiology: Recent Results (from the past 240 hour(s))  Blood Culture (routine x 2)     Status: Abnormal   Collection Time: 10/06/19 12:40 PM   Specimen: BLOOD  Result Value Ref Range Status   Specimen Description BLOOD RIGHT ANTECUBITAL  Final   Special Requests   Final    BOTTLES DRAWN AEROBIC AND ANAEROBIC Blood Culture results may not be optimal due to an inadequate volume of blood received in culture bottles   Culture  Setup Time   Final    IN BOTH AEROBIC AND ANAEROBIC BOTTLES GRAM NEGATIVE RODS CRITICAL RESULT CALLED TO, READ BACK BY AND VERIFIED WITH: Denton Brick [facility] 10/07/19 0419 JDW Performed at [facility], [place] [address] [place], [place] [address]    Culture KLEBSIELLA PNEUMONIAE (A)  Final   Report Status 10/09/2019 FINAL  Final   Organism ID, Bacteria KLEBSIELLA PNEUMONIAE  Final      Susceptibility   Klebsiella pneumoniae - MIC*    AMPICILLIN >=32 RESISTANT Resistant     CEFAZOLIN <=4 SENSITIVE Sensitive     CEFEPIME <=1 SENSITIVE Sensitive     CEFTAZIDIME <=1  SENSITIVE Sensitive     CEFTRIAXONE <=1 SENSITIVE Sensitive     CIPROFLOXACIN <=0.25 SENSITIVE Sensitive     GENTAMICIN <=1 SENSITIVE Sensitive     IMIPENEM 0.5 SENSITIVE Sensitive     TRIMETH/SULFA <=20 SENSITIVE Sensitive     AMPICILLIN/SULBACTAM 8 SENSITIVE Sensitive     PIP/TAZO <=4 SENSITIVE Sensitive     Extended ESBL NEGATIVE Sensitive     * KLEBSIELLA PNEUMONIAE  Blood Culture ID Panel (Reflexed)     Status: Abnormal   Collection Time: 10/06/19 12:40 PM  Result Value Ref Range Status   Enterococcus species NOT DETECTED NOT DETECTED Final   Listeria monocytogenes NOT DETECTED NOT DETECTED Final   Staphylococcus species NOT DETECTED NOT DETECTED Final   Staphylococcus aureus (BCID) NOT DETECTED NOT DETECTED Final   Streptococcus species NOT DETECTED NOT DETECTED Final   Streptococcus agalactiae NOT DETECTED NOT DETECTED Final   Streptococcus pneumoniae NOT DETECTED NOT DETECTED Final   Streptococcus pyogenes NOT DETECTED NOT DETECTED Final   Acinetobacter baumannii NOT DETECTED NOT DETECTED Final   Enterobacteriaceae species DETECTED (A) NOT DETECTED Final    Comment: Enterobacteriaceae represent a large family of gram-negative bacteria, not a single organism. CRITICAL RESULT CALLED TO, READ BACK BY AND VERIFIED WITH: G ABBOTT PHARMD 10/07/19 0419 JDW    Enterobacter cloacae complex NOT DETECTED NOT DETECTED Final   Escherichia coli NOT DETECTED NOT DETECTED Final   Klebsiella oxytoca NOT DETECTED NOT DETECTED Final   Klebsiella pneumoniae DETECTED (A) NOT DETECTED Final    Comment: CRITICAL RESULT  CALLED TO, READ BACK BY AND VERIFIED WITH: G ABBOTT PHARMD 10/07/19 0419 JDW    Proteus species NOT DETECTED NOT DETECTED Final   Serratia marcescens NOT DETECTED NOT DETECTED Final   Carbapenem resistance NOT DETECTED NOT DETECTED Final   Haemophilus influenzae NOT DETECTED NOT DETECTED Final   Neisseria meningitidis NOT DETECTED NOT DETECTED Final   Pseudomonas aeruginosa NOT DETECTED NOT DETECTED Final   Candida albicans NOT DETECTED NOT DETECTED Final   Candida glabrata NOT DETECTED NOT DETECTED Final   Candida krusei NOT DETECTED NOT DETECTED Final   Candida parapsilosis NOT DETECTED NOT DETECTED Final   Candida tropicalis NOT DETECTED NOT DETECTED Final    Comment: Performed at [facility], [place] [address], [place], [place] [address]  Blood Culture (routine x 2)     Status: Abnormal   Collection Time: 10/06/19 12:45 PM   Specimen: BLOOD RIGHT HAND  Result Value Ref Range Status   Specimen Description BLOOD RIGHT HAND  Final   Special Requests   Final    BOTTLES DRAWN AEROBIC AND ANAEROBIC Blood Culture results may not be optimal due to an inadequate volume of blood received in culture bottles   Culture  Setup Time   Final    IN BOTH AEROBIC AND ANAEROBIC BOTTLES GRAM NEGATIVE RODS CRITICAL VALUE NOTED.  VALUE IS CONSISTENT WITH PREVIOUSLY REPORTED AND CALLED VALUE.    Culture (A)  Final    KLEBSIELLA PNEUMONIAE SUSCEPTIBILITIES PERFORMED ON PREVIOUS CULTURE WITHIN THE LAST 5 DAYS. Performed at [facility], [place] [address] [place], [place] [address]    Report Status 10/09/2019 FINAL  Final  SARS CORONAVIRUS 2 (TAT 6-24 HRS) Nasopharyngeal Nasopharyngeal Swab     Status: None   Collection Time: 10/06/19  3:35 PM   Specimen: Nasopharyngeal Swab  Result Value Ref Range Status   SARS Coronavirus 2 NEGATIVE NEGATIVE Final    Comment: (NOTE) SARS-CoV-2 target nucleic acids  are NOT DETECTED. The SARS-CoV-2 RNA is generally detectable in upper and lower respiratory  specimens during the acute phase of infection. Negative results do not preclude SARS-CoV-2 infection, do not rule out co-infections with other pathogens, and should not be used as the sole basis for treatment or other patient management decisions. Negative results must be combined with clinical observations, patient history, and epidemiological information. The expected result is Negative. Fact Sheet for Patients: SugarRoll.be Fact Sheet for Healthcare Providers: https://www.woods-mathews.com/ This test is not yet approved or cleared by the [place] FDA and  has been authorized for detection and/or diagnosis of SARS-CoV-2 by FDA under an Emergency Use Authorization (EUA). This EUA will remain  in effect (meaning this test can be used) for the duration of the COVID-19 declaration under Section 56 4(b)(1) of the Act, 21 U.S.C. section 360bbb-3(b)(1), unless the authorization is terminated or revoked sooner. Performed at [facility], [place] [address], [place], [place] [address]   Urine culture     Status: Abnormal   Collection Time: 10/06/19  4:02 PM   Specimen: In/Out Cath Urine  Result Value Ref Range Status   Specimen Description IN/OUT CATH URINE  Final   Special Requests   Final    NONE Performed at [facility], [place] [address], [place], [place] [address]    Culture (A)  Final    30,000 COLONIES/mL ESCHERICHIA COLI Confirmed Extended Spectrum Beta-Lactamase Producer (ESBL).  In bloodstream infections from ESBL organisms, carbapenems are preferred over piperacillin/tazobactam. They are shown to have a lower risk of mortality. 10,000 COLONIES/mL STAPHYLOCOCCUS EPIDERMIDIS    Report Status 10/08/2019 FINAL  Final   Organism ID, Bacteria ESCHERICHIA COLI (A)  Final   Organism ID, Bacteria STAPHYLOCOCCUS EPIDERMIDIS (A)  Final      Susceptibility   Escherichia coli - MIC*    AMPICILLIN >=32 RESISTANT Resistant      CEFAZOLIN >=64 RESISTANT Resistant     CEFTRIAXONE >=64 RESISTANT Resistant     CIPROFLOXACIN >=4 RESISTANT Resistant     GENTAMICIN <=1 SENSITIVE Sensitive     IMIPENEM <=0.25 SENSITIVE Sensitive     NITROFURANTOIN <=16 SENSITIVE Sensitive     TRIMETH/SULFA <=20 SENSITIVE Sensitive     AMPICILLIN/SULBACTAM 4 SENSITIVE Sensitive     PIP/TAZO <=4 SENSITIVE Sensitive     Extended ESBL POSITIVE Resistant     * 30,000 COLONIES/mL ESCHERICHIA COLI   Staphylococcus epidermidis - MIC*    CIPROFLOXACIN 2 INTERMEDIATE Intermediate     GENTAMICIN <=0.5 SENSITIVE Sensitive     NITROFURANTOIN <=16 SENSITIVE Sensitive     OXACILLIN >=4 RESISTANT Resistant     TETRACYCLINE <=1 SENSITIVE Sensitive     VANCOMYCIN 1 SENSITIVE Sensitive     TRIMETH/SULFA <=10 SENSITIVE Sensitive     CLINDAMYCIN <=0.25 SENSITIVE Sensitive     RIFAMPIN <=0.5 SENSITIVE Sensitive     Inducible Clindamycin NEGATIVE Sensitive     * 10,000 COLONIES/mL STAPHYLOCOCCUS EPIDERMIDIS  MRSA PCR Screening     Status: None   Collection Time: 10/08/19 10:50 PM   Specimen: Nasal Mucosa; Nasopharyngeal  Result Value Ref Range Status   MRSA by PCR NEGATIVE NEGATIVE Final    Comment:        The GeneXpert MRSA Assay (FDA approved for NASAL specimens only), is one component of a comprehensive MRSA colonization surveillance program. It is not intended to diagnose MRSA infection nor to guide or monitor treatment for MRSA infections. Performed at [facility], [place]  [address] [place], [place] [address]   Body fluid culture     Status: None   Collection Time: 10/10/19 12:59 PM   Specimen: A: Abdomen; Peritoneal Fluid   B: Abdomen; Peritoneal Fluid  Result Value Ref Range Status   Specimen Description ABDOMEN  Final   Special Requests NONE  Final   Gram Stain   Final    FEW WBC PRESENT,BOTH PMN AND MONONUCLEAR NO ORGANISMS SEEN    Culture   Final    NO GROWTH Performed at [facility], [address]  [place], [place] [address]    Report Status 10/13/2019 FINAL  Final  Aerobic/Anaerobic Culture (surgical/deep wound)     Status: None (Preliminary result)   Collection Time: 10/10/19  2:26 PM   Specimen: Abscess  Result Value Ref Range Status   Specimen Description ABSCESS LIVER  Final   Special Requests NONE  Final   Gram Stain   Final    ABUNDANT WBC PRESENT,BOTH PMN AND MONONUCLEAR FEW GRAM VARIABLE ROD RARE GRAM NEGATIVE RODS Performed at [facility], [place] [address] [place], [place] [address]    Culture   Final    MODERATE KLEBSIELLA PNEUMONIAE NO ANAEROBES ISOLATED; CULTURE IN PROGRESS FOR 5 DAYS    Report Status PENDING  Incomplete   Organism ID, Bacteria KLEBSIELLA PNEUMONIAE  Final      Susceptibility   Klebsiella pneumoniae - MIC*    AMPICILLIN >=32 RESISTANT Resistant     CEFAZOLIN <=4 SENSITIVE Sensitive     CEFEPIME <=1 SENSITIVE Sensitive     CEFTAZIDIME <=1 SENSITIVE Sensitive     CEFTRIAXONE <=1 SENSITIVE Sensitive     CIPROFLOXACIN <=0.25 SENSITIVE Sensitive     GENTAMICIN <=1 SENSITIVE Sensitive     IMIPENEM <=0.25 SENSITIVE Sensitive     TRIMETH/SULFA <=20 SENSITIVE Sensitive     AMPICILLIN/SULBACTAM 4 SENSITIVE Sensitive     PIP/TAZO <=4 SENSITIVE Sensitive     Extended ESBL NEGATIVE Sensitive     * MODERATE KLEBSIELLA PNEUMONIAE  Culture, blood (routine x 2)     Status: None (Preliminary result)   Collection Time: 10/11/19  5:05 PM   Specimen: BLOOD RIGHT ARM  Result Value Ref Range Status   Specimen Description BLOOD RIGHT ARM  Final   Special Requests AEROBIC BOTTLE ONLY Blood Culture adequate volume  Final   Culture   Final    NO GROWTH 4 DAYS Performed at [facility], [address], [place], [place] [address]    Report Status PENDING  Incomplete  Culture, blood (routine x 2)     Status: None (Preliminary result)   Collection Time: 10/11/19  5:05 PM   Specimen: BLOOD RIGHT ARM  Result Value Ref Range Status   Specimen Description  BLOOD RIGHT ARM  Final   Special Requests AEROBIC BOTTLE ONLY Blood Culture adequate volume  Final   Culture   Final    NO GROWTH 4 DAYS Performed at [facility], [place] [address] [place], [place] [address]    Report Status PENDING  Incomplete    Janene Madeira, MSN, NP-C [facility] Infectious Disease [email or web address] Pager: [phone] Office: [phone] [place]: [phone]

## 2019-10-15 NOTE — Care Management Important Message (Signed)
Important Message  Patient Details  Name: Courtney [name] MRN: [id number] Date of Birth: 08-01-1946   Medicare Important Message Given:  Yes     Memory Argue 10/15/2019, 4:20 PM

## 2019-10-15 NOTE — Progress Notes (Signed)
[facility] Progress Note  CC:  No complaints today, awaiting drain placement  Assessment / Plan: Klebsiella pneumoniae liver abscess and possible endocarditis Normocytic anemia without overt GI bleeding  Percutaneous drainage planned today after CT scan yesterday showed re-accumulation of the liver abscess.   Agree with antibiotics per ID. Recommend outpatient colonoscopy given the potential association of Klebsiella penumoniae with colon cancer. Will plan outpatient GI follow-up to arrange.   GI will move to stand-by. Please call the on-call gastroenterologist with any additional questions or concerns during this hospitalization.   Subjective: No complaints today. Her son was present and provided interpretation. Declined normal interpreter.   Objective:  Vital signs in last 24 hours: Temp:  [98.3 F (36.8 C)-100.2 F (37.9 C)] 99.3 F (37.4 C) (11/02 0352) Pulse Rate:  [74-86] 86 (11/02 0352) Resp:  [18-22] 18 (11/02 0352) BP: (98-118)/(49-73) 98/66 (11/02 0352) SpO2:  [92 %-96 %] 92 % (11/02 0352) Last BM Date: 10/12/19 General:   Alert, in NAD. Standing in the doorway when I arrived.  Abdomen:  Soft. Nontender. Nondistended. Normal bowel sounds. No rebound or guarding. LAD: No inguinal or umbilical LAD Extremities:  RUE PICC line. Without edema. Neurologic:  Alert and  oriented x4;  grossly normal neurologically. Psych:  Alert and cooperative. Normal mood and affect.  Lab Results: Recent Labs    10/13/19 0314 10/14/19 0518 10/15/19 0500  WBC 20.4* 17.7* 16.7*  HGB 10.4* 9.2* 9.0*  HCT 30.9* 27.4* 27.2*  PLT 209 267 311   BMET Recent Labs    10/13/19 0314 10/14/19 0518 10/15/19 0500  NA 136 135 135  K 3.4* 3.6 3.2*  CL 103 105 104  CO2 21* 24 25  GLUCOSE 169* 155* 138*  BUN 6* 5* 6*  CREATININE 0.61 0.54 0.49  CALCIUM 7.6* 7.5* 7.5*   LFT Recent Labs    10/15/19 0500  PROT 5.5*  ALBUMIN 1.8*  AST 56*  ALT 151*  ALKPHOS 102    BILITOT 0.6   PT/INR No results for input(s): LABPROT, INR in the last 72 hours. Hepatitis Panel No results for input(s): HEPBSAG, HCVAB, HEPAIGM, HEPBIGM in the last 72 hours.  Ct Abdomen Pelvis W Contrast  Result Date: 10/14/2019 CLINICAL DATA:  Abnormal liver function tests. History of liver abscess, completely aspirated under ultrasound guidance on 10/02/2019. EXAM: CT ABDOMEN AND PELVIS WITH CONTRAST TECHNIQUE: Multidetector CT imaging of the abdomen and pelvis was performed using the standard protocol following bolus administration of intravenous contrast. CONTRAST:  158mL OMNIPAQUE IOHEXOL 300 MG/ML  SOLN COMPARISON:  10/07/2019 FINDINGS: Lower chest: Moderate-sized bilateral pleural effusions, increased. Progressive compressive atelectasis in both lower lobes. Mildly enlarged heart. Intracardiac pacer wires. Hepatobiliary: Interval re-accumulation of a an irregular rim enhancing fluid collection in the right lobe of the liver, measuring 3.5 x 3.0 cm on image number 14 series 3 and 3.3 cm in length on coronal image number 58. There is wedge-shaped heterogeneous low density in the right lobe posterior to this fluid collection as well as tubular low density extending medially to the proximal portion of the right hepatic vein. There is a smaller oval fluid collection more medially, measuring 1.0 cm on image number 14 series 3. Normal appearing gallbladder. Pancreas: Unremarkable. No pancreatic ductal dilatation or surrounding inflammatory changes. Spleen: Normal in size without focal abnormality. Adrenals/Urinary Tract: Adrenal glands are unremarkable. Kidneys are normal, without renal calculi, focal lesion, or hydronephrosis. Bladder is unremarkable. Stomach/Bowel: Stomach is within normal limits. Appendix appears normal.  No evidence of bowel wall thickening, distention, or inflammatory changes. Prominent stool in the colon. Vascular/Lymphatic: No significant vascular findings are present. No  enlarged abdominal or pelvic lymph nodes. Reproductive: Uterus and bilateral adnexa are unremarkable. Other: Bilateral subcutaneous edema. No free peritoneal fluid or air. Musculoskeletal: Stable L3 and L4 vertebral body minimal compression deformities and endplate irregularity with multiple small Schmorl's nodes. Mild lower thoracic spine degenerative changes. IMPRESSION: 1. Interval re-accumulation of an irregular rim enhancing fluid collection in the right lobe of the liver, compatible with an abscess measuring 3.5 x 3.3 x 3.0 cm. 2. Additional smaller probable abscess in the right lobe of the liver, as described above. 3. Changes compatible with parenchymal infection in the right lobe of the liver with linear extension medially. 4. Moderate-sized bilateral pleural effusions, increased. 5. Progressive compressive atelectasis in both lower lobes. 6. Bilateral subcutaneous edema. Electronically Signed   By: Beckie Salts M.D.   On: 10/14/2019 12:34      LOS: 9 days   Tressia Danas  10/15/2019, 11:26 AM

## 2019-10-15 NOTE — TOC Progression Note (Addendum)
Transition of Care [facility]) - Progression Note    Patient Details  Name: Courtney [name] MRN: [id number] Date of Birth: 1946/12/11  [facility] of [facility]) CM/SW Contact  Zenon Mayo, RN Phone Number: 10/15/2019, 12:36 PM  Clinical Narrative:    NCM spoke with son at bedside, [place], [place] offered choice for [facility] for IV abx,  He chose 4 different agencies. [facility], [facility], [facility], and [facility].  [facility] was the only one that could take the referral with soc date on Thursday, which will be fine if patient is dc on Tuesday and son will do the iv abx for Wed and then [facility] will start on Thursday.  Carolynn Sayers to do the teaching with son.  NCM will assist with  Scheduling  patient to have a follow up at the [facility] clinic on 11/30.     Expected Discharge Plan: [place] Barriers to Discharge: No Barriers Identified  Expected Discharge Plan and Services Expected Discharge Plan: [place] In-house Referral: NA Discharge Planning Services: CM Consult Post Acute Care Choice: [place] arrangements for the past 2 months: Single Family Home                 DME Arranged: (NA)         HH Arranged: RN [place] Agency: [facility] Date [place]: 10/15/19 Time [place]: 1232 Representative spoke with at [place]: [place] Determinants of Health ([place]) Interventions    Readmission Risk Interventions No flowsheet data found.

## 2019-10-15 NOTE — Progress Notes (Signed)
PHARMACY CONSULT NOTE FOR:  OUTPATIENT  PARENTERAL ANTIBIOTIC THERAPY (OPAT)  Indication: Klebsiella endocarditis/ bacteremia Regimen:  Ceftriaxone 2gm IV q24hrs End date: 11/26/19  IV antibiotic discharge orders are pended. To discharging provider:  please sign these orders via discharge navigator,  Select New Orders & click on the button choice - Manage This Unsigned Work.     Thank you for allowing pharmacy to be a part of this patient's care.  Arty Baumgartner, [place] Pager: [phone] or phone: [phone] 10/15/2019, 8:27 AM

## 2019-10-16 LAB — COMPREHENSIVE METABOLIC PANEL
ALT: 114 U/L — ABNORMAL HIGH (ref 0–44)
AST: 49 U/L — ABNORMAL HIGH (ref 15–41)
Albumin: 1.7 g/dL — ABNORMAL LOW (ref 3.5–5.0)
Alkaline Phosphatase: 110 U/L (ref 38–126)
Anion gap: 10 (ref 5–15)
BUN: 6 mg/dL — ABNORMAL LOW (ref 8–23)
CO2: 24 mmol/L (ref 22–32)
Calcium: 7.6 mg/dL — ABNORMAL LOW (ref 8.9–10.3)
Chloride: 101 mmol/L (ref 98–111)
Creatinine, Ser: 0.71 mg/dL (ref 0.44–1.00)
GFR calc Af Amer: >60 mL/min (ref >60)
GFR calc non Af Amer: >60 mL/min (ref >60)
Glucose, Bld: 202 mg/dL — ABNORMAL HIGH (ref 70–99)
Potassium: 3.7 mmol/L (ref 3.5–5.1)
Sodium: 135 mmol/L (ref 135–145)
Total Bilirubin: 0.6 mg/dL (ref 0.3–1.2)
Total Protein: 5.8 g/dL — ABNORMAL LOW (ref 6.5–8.1)

## 2019-10-16 LAB — CBC
HCT: 26.7 % — ABNORMAL LOW (ref 36.0–46.0)
Hemoglobin: 8.7 g/dL — ABNORMAL LOW (ref 12.0–15.0)
MCH: 30.6 pg (ref 26.0–34.0)
MCHC: 32.6 g/dL (ref 30.0–36.0)
MCV: 94 fL (ref 80.0–100.0)
Platelets: 342 10*3/uL (ref 150–400)
RBC: 2.84 MIL/uL — ABNORMAL LOW (ref 3.87–5.11)
RDW: 13.4 % (ref 11.5–15.5)
WBC: 21.7 10*3/uL — ABNORMAL HIGH (ref 4.0–10.5)
nRBC: 0 % (ref 0.0–0.2)

## 2019-10-16 LAB — GLUCOSE, CAPILLARY
Glucose-Capillary: 179 mg/dL — ABNORMAL HIGH (ref 70–99)
Glucose-Capillary: 291 mg/dL — ABNORMAL HIGH (ref 70–99)
Glucose-Capillary: 310 mg/dL — ABNORMAL HIGH (ref 70–99)
Glucose-Capillary: 142 mg/dL — ABNORMAL HIGH (ref 70–99)

## 2019-10-16 LAB — CULTURE, BLOOD (ROUTINE X 2)
Culture: NO GROWTH
Culture: NO GROWTH
Special Requests: ADEQUATE
Special Requests: ADEQUATE

## 2019-10-16 LAB — LIPID PANEL
Cholesterol: 110 mg/dL (ref 0–200)
HDL: 14 mg/dL — ABNORMAL LOW (ref >40)
LDL Cholesterol: 77 mg/dL (ref 0–99)
Total CHOL/HDL Ratio: 7.9 RATIO
Triglycerides: 96 mg/dL (ref <150)
VLDL: 19 mg/dL (ref 0–40)

## 2019-10-16 LAB — MAGNESIUM: Magnesium: 1.7 mg/dL (ref 1.7–2.4)

## 2019-10-16 MED ORDER — INSULIN ASPART 100 UNIT/ML ~~LOC~~ SOLN
4.0000 [IU] | Freq: Three times a day (TID) | SUBCUTANEOUS | Status: DC
  Administered 2019-10-17 – 2019-10-18 (×4): 4 [IU] via SUBCUTANEOUS

## 2019-10-16 MED ORDER — INSULIN GLARGINE 100 UNIT/ML ~~LOC~~ SOLN
15.0000 [IU] | Freq: Every day | SUBCUTANEOUS | Status: DC
  Administered 2019-10-17 – 2019-10-18 (×2): 15 [IU] via SUBCUTANEOUS
  Filled 2019-10-16 (×2): qty 0.15

## 2019-10-16 MED ORDER — APIXABAN 5 MG PO TABS
5.0000 mg | ORAL_TABLET | Freq: Two times a day (BID) | ORAL | Status: DC
  Administered 2019-10-16 – 2019-10-18 (×4): 5 mg via ORAL
  Filled 2019-10-16 (×4): qty 1

## 2019-10-16 NOTE — Progress Notes (Signed)
PROGRESS NOTE    Courtney [name]  MGQ:[id number] DOB: 1946-11-01 DOA: 10/06/2019 PCP: Patient, No Pcp Per      Brief Narrative:  Mrs. Courtney [name] is a 73 y.o. F with DM, Afib and tachy/brady s/p PPM with flecainide/Eliquis who presented with fever, confusion progressing over about 3 days.  In the ER, patient had Glu 369, Bicarb 9, WBC 14.9.  LP could not be obtained but she was started on empiric antibiotics with vancomycin, ceftriaxone and ampicillin to cover meningitis.      Assessment & Plan:  Sepsis from Klebsiella bacteremia resulting in multifocal pneumonia, liver abscess, valvular endocarditis Hepatic abscess, s/p drain 10/28 Admitted with confusion/encephalopathy, tachycardia, WBC elevated.  CT abdomen and pelvis showed cholangitis and liver abscess.  Started on empiric antibiotics.  Liver abscess drained by IR (due to ascites, percutaneous drain was not left this was just an aspiration), HIDA scan negative for cholecystitis.  Blood cultures growing Klebsiella (not ESBL) with string sign.  Urine culture with ESBL.  ID and CT surgery (to address endocarditis) and EP consulted (to address management of pacer leads in the setting of bacteremia).  CT surgery recommend no valve repair EP recommend medical therapy and surveillance cultures after therapy completed rather than lead removal now.  Repeat CT scan showed reaccumulation of her previous abscess. -IR consulted and performed percutaneous drain placement on 11/2 -Continue ceftriaxone until 12/14 -PICC in place -- >  OPAT ordered -Consulted ID, GI, appreciate expert insights  -Will need GI follow up at discharge to arrange colonoscopy (given liver abscess) -ID post-discharge follow up in place  -Currently, patient has had a rise in her WBC count.  She will need further monitoring to ensure this is trending down prior to discharge.    Acute metabolic encephalopathy Due to sepsis.  Now resolved  Transaminitis Due to liver abscess.   Hepatitis A antibody reactive, hep B core antibody reactive.  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  Will restart today.  DKA Diabetes DKA resolved, hemoglobin A1c 16.4.   Blood sugars remain elevated today. -Increase Lantus -Start meal coverage NovoLog -Continue SS correction  GERD -Stop pantoprazole while on antibiotics  ESBL urine culture ID suspect this is colonization only.  Anemia of chronic disease No clinical signs of bleeding.     MDM and disposition: The below labs and imaging reports reviewed and summarized above.  Medication management as above.   The patient was admitted with sepsis from Klebsiella, with pneumonia, abscess and endocarditis.    She has now been started on appropriate antibiotics, her largest liver abscess has been drained once, and on 11/2 she had a percutaneous drain placement.  Since yesterday, her WBC count has trended up.  She will need continued observation and treatment.  Once WBC count started trending down, can consider discharge.       DVT prophylaxis: Apixaban Code Status: FULL Family Communication: Discussed with son at the bedside    Consultants:   GI  ID  CT surgery  EP  Procedures:   10/27 HIDA  10/30 TEE   Antimicrobials:   Ceftriaxone   Subjective: Complains of continued pain in right upper quadrant drain was placed.  No shortness of breath.   Objective: Vitals:   10/15/19  2104 10/15/19 2345 10/16/19 0340 10/16/19 1640  BP: (!) 102/53 93/76 (!) 115/58 (!) 142/62  Pulse: 90 92 88 73  Resp: Temp: 99.8 F (37.7 C) 99 F (37.2 C) 99.3 F (37.4 C) 98.3 F (36.8 C)  TempSrc: Oral  Oral Oral Oral  SpO2: 95% 94% 94% 96%  Weight:      Height:        Intake/Output Summary (Last 24 hours) at 10/16/2019 1946 Last data filed at 10/16/2019 0931 Gross per 24 hour  Intake 120 ml  Output 460 ml  Net -340 ml   Filed Weights   10/07/19 0142 10/08/19 0641 10/12/19 1303  Weight: 45.2 kg 47.7 kg 47.7 kg    Examination: General exam: Alert, awake, oriented x 3 Respiratory system: Clear to auscultation. Respiratory effort normal. Cardiovascular system:RRR. No murmurs, rubs, gallops. Gastrointestinal system: Abdomen is nondistended, soft and tender in right upper quadrant drain placement. No organomegaly or masses felt. Normal bowel sounds heard. Central nervous system: Alert and oriented. No focal neurological deficits. Extremities: No C/C/E, +pedal pulses Skin: No rashes, lesions or ulcers Psychiatry: Judgement and insight appear normal. Mood & affect appropriate.      Data Reviewed: I have personally reviewed following labs and imaging studies:  CBC: Recent Labs  Lab 10/12/19 0235 10/13/19 0314 10/14/19 0518 10/15/19 0500 10/16/19 0239  WBC 22.2* 20.4* 17.7* 16.7* 21.7*  HGB 10.7* 10.4* 9.2* 9.0* 8.7*  HCT 31.3* 30.9* 27.4* 27.2* 26.7*  MCV 90.2 91.4 92.6 92.5 94.0  PLT 139* 209 267 311 342   Basic Metabolic Panel: Recent Labs  Lab 10/11/19 1645 10/12/19 0235 10/13/19 0314 10/14/19 0518 10/15/19 0500 10/16/19 0239  NA 139 140 136 135 135 135  K 3.7 3.7 3.4* 3.6 3.2* 3.7  CL 110 111 103 105 104 101  CO2 21* 21* 21* GLUCOSE 129* 128* 169* 155* 138* 202*  BUN 15 9 6* 5* 6* 6*  CREATININE 0.73 0.61 0.61 0.54 0.49 0.71  CALCIUM 7.6* 7.5* 7.6* 7.5* 7.5* 7.6*  MG 1.7  --   --  1.6* 1.7 1.7   GFR: Estimated Creatinine Clearance: 41 mL/min (by C-G formula based on SCr of 0.71 mg/dL). Liver Function Tests: Recent Labs  Lab 10/12/19 0235 10/13/19 0314 10/14/19 0518 10/15/19 0500 10/16/19 0239  AST 267* 180* 85* 56* 49*  ALT 334* 285*  193* 151* 114*  ALKPHOS 123 111 103 102 110  BILITOT 0.9 1.1 0.9 0.6 0.6  PROT 4.3* 5.1* 5.0* 5.5* 5.8*  ALBUMIN 1.6* 1.6* 1.6* 1.8* 1.7*   No results for input(s): LIPASE, AMYLASE in the last 168 hours. No results for input(s): AMMONIA in the last 168 hours. Coagulation Profile: No results for input(s): INR, PROTIME in the last 168 hours. Cardiac Enzymes: No results for input(s): CKTOTAL, CKMB, CKMBINDEX, TROPONINI in the last 168 hours. BNP (last 3 results) No results for input(s): PROBNP in the last 8760 hours. HbA1C: No results for input(s): HGBA1C in the last 72 hours. CBG: Recent Labs  Lab 10/15/19 1808 10/15/19 2106 10/16/19 0614 10/16/19 1150 10/16/19 1554  GLUCAP 149* 246* 179* 291* 310*   Lipid Profile: Recent Labs    10/16/19 0239  CHOL 110  HDL 14*  LDLCALC 77  TRIG 96  CHOLHDL 7.9   Thyroid Function Tests: No results for input(s): TSH, T4TOTAL, FREET4, T3FREE, THYROIDAB in the last 72 hours. Anemia Panel: No results for input(s): VITAMINB12, FOLATE, FERRITIN, TIBC, IRON, RETICCTPCT in  the last 72 hours. Urine analysis:    Component Value Date/Time   COLORURINE YELLOW 10/10/2019 0445   APPEARANCEUR CLEAR 10/10/2019 0445   LABSPEC 1.021 10/10/2019 0445   PHURINE 6.0 10/10/2019 0445   GLUCOSEU 50 (A) 10/10/2019 0445   HGBUR NEGATIVE 10/10/2019 0445   BILIRUBINUR NEGATIVE 10/10/2019 0445   BILIRUBINUR neg 05/05/2015 1038   KETONESUR 5 (A) 10/10/2019 0445   PROTEINUR NEGATIVE 10/10/2019 0445   UROBILINOGEN 0.2 05/05/2015 1038   NITRITE NEGATIVE 10/10/2019 0445   LEUKOCYTESUR NEGATIVE 10/10/2019 0445   Sepsis Labs: @LABRCNTIP (procalcitonin:4,lacticacidven:4)  ) Recent Results (from the past 240 hour(s))  MRSA PCR Screening     Status: None   Collection Time: 10/08/19 10:50 PM   Specimen: Nasal Mucosa; Nasopharyngeal  Result Value Ref Range Status   MRSA by PCR NEGATIVE NEGATIVE Final    Comment:        The GeneXpert MRSA Assay (FDA approved  for NASAL specimens only), is one component of a comprehensive MRSA colonization surveillance program. It is not intended to diagnose MRSA infection nor to guide or monitor treatment for MRSA infections. Performed at [facility], [address] [place], [place] [address]   Body fluid culture     Status: None   Collection Time: 10/10/19 12:59 PM   Specimen: A: Abdomen; Peritoneal Fluid   B: Abdomen; Peritoneal Fluid  Result Value Ref Range Status   Specimen Description ABDOMEN  Final   Special Requests NONE  Final   Gram Stain   Final    FEW WBC PRESENT,BOTH PMN AND MONONUCLEAR NO ORGANISMS SEEN    Culture   Final    NO GROWTH Performed at [facility], [address] [place], [place] [address]    Report Status 10/13/2019 FINAL  Final  Aerobic/Anaerobic Culture (surgical/deep wound)     Status: None   Collection Time: 10/10/19  2:26 PM   Specimen: Abscess  Result Value Ref Range Status   Specimen Description ABSCESS LIVER  Final   Special Requests NONE  Final   Gram Stain   Final    ABUNDANT WBC PRESENT,BOTH PMN AND MONONUCLEAR FEW GRAM VARIABLE ROD RARE GRAM NEGATIVE RODS    Culture   Final    MODERATE KLEBSIELLA PNEUMONIAE NO ANAEROBES ISOLATED Performed at [facility], [address] [place], [place] [address]    Report Status 10/15/2019 FINAL  Final   Organism ID, Bacteria KLEBSIELLA PNEUMONIAE  Final      Susceptibility   Klebsiella pneumoniae - MIC*    AMPICILLIN >=32 RESISTANT Resistant     CEFAZOLIN <=4 SENSITIVE Sensitive     CEFEPIME <=1 SENSITIVE Sensitive     CEFTAZIDIME <=1 SENSITIVE Sensitive     CEFTRIAXONE <=1 SENSITIVE Sensitive     CIPROFLOXACIN <=0.25 SENSITIVE Sensitive     GENTAMICIN <=1 SENSITIVE Sensitive     IMIPENEM <=0.25 SENSITIVE Sensitive     TRIMETH/SULFA <=20 SENSITIVE Sensitive     AMPICILLIN/SULBACTAM 4 SENSITIVE Sensitive     PIP/TAZO <=4 SENSITIVE Sensitive     Extended ESBL NEGATIVE Sensitive     *  MODERATE KLEBSIELLA PNEUMONIAE  Culture, blood (routine x 2)     Status: None   Collection Time: 10/11/19  5:05 PM   Specimen: BLOOD RIGHT ARM  Result Value Ref Range Status   Specimen Description BLOOD RIGHT ARM  Final   Special Requests AEROBIC BOTTLE ONLY Blood Culture adequate volume  Final   Culture   Final  NO GROWTH 5 DAYS Performed at [facility], [address], [place], [place] [address]    Report Status 10/16/2019 FINAL  Final  Culture, blood (routine x 2)     Status: None   Collection Time: 10/11/19  5:05 PM   Specimen: BLOOD RIGHT ARM  Result Value Ref Range Status   Specimen Description BLOOD RIGHT ARM  Final   Special Requests AEROBIC BOTTLE ONLY Blood Culture adequate volume  Final   Culture   Final    NO GROWTH 5 DAYS Performed at [facility], [address], [place], [place] [address]    Report Status 10/16/2019 FINAL  Final         Radiology Studies: Ir Guided Horace Porteous W Catheter Placement  Result Date: 10/15/2019 INDICATION: Reaccumulation of right lobe hepatic abscess after prior ultrasound-guided aspiration and decompression on 10/10/2019. EXAM: ULTRASOUND-GUIDED PERCUTANEOUS CATHETER DRAINAGE OF HEPATIC ABSCESS MEDICATIONS: The patient is currently admitted to the hospital and receiving intravenous antibiotics. The antibiotics were administered within an appropriate time frame prior to the initiation of the procedure. ANESTHESIA/SEDATION: Fentanyl 37.5 mcg IV; Versed 0.5 mg IV Moderate Sedation Time:  14 minutes. The patient was continuously monitored during the procedure by the interventional radiology nurse under my direct supervision. COMPLICATIONS: None immediate. PROCEDURE: Informed written consent was obtained from the patient after a thorough discussion of the procedural risks, benefits and alternatives. All questions were addressed. Maximal Sterile Barrier Technique was utilized including caps, mask, sterile gowns, sterile gloves, sterile  drape, hand hygiene and skin antiseptic. A timeout was performed prior to the initiation of the procedure. Ultrasound was performed of the liver. Under direct ultrasound guidance, an 18 gauge trocar needle was advanced to the level of a right lobe hepatic abscess. After return of fluid, contrast was injected under fluoroscopy to define the abscess cavity. A guidewire was advanced and the needle removed. The percutaneous tract was dilated over the wire and a 10 French percutaneous drain advanced. Final drainage catheter positioning was confirmed by fluoroscopy. The drain was flushed with saline and attached to a suction bulb. It was secured at the skin with a Prolene retention suture. FINDINGS: Recurrent hepatic abscess yielded purulent fluid. Contrast injection demonstrates a very irregular abscess cavity with multiple irregular communicating pockets present. The 10 French drain was placed and is draining very thick, bloody fluid after placement. IMPRESSION: Percutaneous catheter drainage of recurrent right hepatic abscess with placement of 10 French drainage catheter. There is return of thick, bloody fluid after drain placement. The drain was attached to a suction bulb. Electronically Signed   By: Irish Lack M.D.   On: 10/15/2019 16:25        Scheduled Meds: . chlorhexidine  15 mL Mouth Rinse BID  . Chlorhexidine Gluconate Cloth  6 each Topical Daily  . Chlorhexidine Gluconate Cloth  6 each Topical Q0600  . enoxaparin (LOVENOX) injection  30 mg Subcutaneous Q24H  . feeding supplement (ENSURE ENLIVE)  237 mL Oral BID BM  . flecainide  50 mg Oral BID  . insulin aspart  0-15 Units Subcutaneous TID WC  . insulin aspart  0-5 Units Subcutaneous QHS  . insulin glargine  12 Units Subcutaneous Daily  . mouth rinse  15 mL Mouth Rinse q12n4p  . multivitamin with minerals  1 tablet Oral Daily  . sodium chloride flush  10-40 mL Intracatheter Q12H  . sodium chloride flush  5 mL Intracatheter Q8H    Continuous Infusions: . sodium chloride Stopped (  10/09/19 0959)  . cefTRIAXone (ROCEPHIN)  IV 2 g (10/16/19 1451)     LOS: 10 days    Time spent: 35 minutes    Erick Blinks, MD [facility] 10/16/2019, 7:46 PM     Please page through AMION:  [email or web address]  If 7PM-7AM, please contact night-coverage

## 2019-10-16 NOTE — Progress Notes (Signed)
Physical Therapy Treatment Patient Details Name: Courtney [name] MRN: [id number] DOB: July 24, 1946 Today's Date: 10/16/2019    History of Present Illness Patient is a 73 y/o female who presents with AMS. Found to have DKA, acute metabolic encephalopathy, sepsis from Klebsiella bacteremia resulting in multifocal pneumonia, liver abscess, valvular endocarditis and Hepatic abscess, s/p drain and paracentesis 10/28. 10/30 TEE consistent with vegetation. s/p drain placement hepatic abscess 11/2. PMH includes DM, Afib and tachy/brady s/p PPM.    PT Comments    Patient progressing well towards PT goals. Reports feeling sore from walking yesterday and from her drain placement. Tolerated gait training with use of RW for safety. Needs max cues for RW management/proximity esp with obstacles. Reports she likes using RW. Declined stair training today due to pain. Encouraged walking with nursing again today. Will follow.    Follow Up Recommendations  No PT follow up;Supervision for mobility/OOB     Equipment Recommendations  Rolling walker with 5" wheels    Recommendations for Other Services       Precautions / Restrictions Precautions Precautions: Fall Precaution Comments: JP drain Restrictions Weight Bearing Restrictions: No    Mobility  Bed Mobility Overal bed mobility: Modified Independent             General bed mobility comments: No assist needed, HOB slightly elevated.  Transfers Overall transfer level: Needs assistance Equipment used: Rolling walker (2 wheeled) Transfers: Sit to/from Stand Sit to Stand: Min guard         General transfer comment: MIn guard for safety. Slow to rise from EOB.  Ambulation/Gait Ambulation/Gait assistance: Min guard Gait Distance (Feet): 210 Feet Assistive device: Rolling walker (2 wheeled) Gait Pattern/deviations: Step-through pattern;Decreased stride length;Drifts right/left Gait velocity: decreased   General Gait Details: Slow, mostly steady  gait with use of RW for support, cues for RW proximity/management. No LOB today. Needed assist for RW navigation esp with obstacles.   Stairs             Wheelchair Mobility    Modified Rankin (Stroke Patients Only)       Balance Overall balance assessment: Needs assistance Sitting-balance support: Feet supported;No upper extremity supported Sitting balance-Leahy Scale: Good     Standing balance support: During functional activity Standing balance-Leahy Scale: Fair Standing balance comment: Does better with UE support.                            Cognition Arousal/Alertness: Awake/alert Behavior During Therapy: WFL for tasks assessed/performed                                   General Comments: for basic mobility tasks. Laughs appropriately.      Exercises      General Comments General comments (skin integrity, edema, etc.): Interpreter [place] [phone]      Pertinent Vitals/Pain Pain Assessment: Faces Faces Pain Scale: Hurts even more Pain Location: drain site Pain Descriptors / Indicators: Sore Pain Intervention(s): Repositioned;Monitored during session    Home Living                      Prior Function            PT Goals (current goals can now be found in the care plan section) Progress towards PT goals: Progressing toward goals    Frequency    Min 3X/week  PT Plan Current plan remains appropriate    Co-evaluation              AM-PAC PT "6 Clicks" Mobility   Outcome Measure  Help needed turning from your back to your side while in a flat bed without using bedrails?: None Help needed moving from lying on your back to sitting on the side of a flat bed without using bedrails?: None Help needed moving to and from a bed to a chair (including a wheelchair)?: None Help needed standing up from a chair using your arms (e.g., wheelchair or bedside chair)?: A Little Help needed to walk in hospital  room?: A Little Help needed climbing 3-5 steps with a railing? : A Little 6 Click Score: 21    End of Session Equipment Utilized During Treatment: Gait belt Activity Tolerance: Patient tolerated treatment well Patient left: in bed;with call bell/phone within reach;with bed alarm set Nurse Communication: Mobility status PT Visit Diagnosis: Unsteadiness on feet (R26.81);Muscle weakness (generalized) (M62.81);Difficulty in walking, not elsewhere classified (R26.2);Pain Pain - part of body: (drain site)     Time: 7517-0017 PT Time Calculation (min) (ACUTE ONLY): 22 min  Charges:  $Gait Training: 8-22 mins                     Marisa Severin, PT, DPT Acute Rehabilitation Services Pager [phone] Office [place] 10/16/2019, 10:05 AM

## 2019-10-17 ENCOUNTER — Telehealth: Payer: Self-pay

## 2019-10-17 LAB — GLUCOSE, CAPILLARY
Glucose-Capillary: 139 mg/dL — ABNORMAL HIGH (ref 70–99)
Glucose-Capillary: 329 mg/dL — ABNORMAL HIGH (ref 70–99)
Glucose-Capillary: 233 mg/dL — ABNORMAL HIGH (ref 70–99)
Glucose-Capillary: 320 mg/dL — ABNORMAL HIGH (ref 70–99)

## 2019-10-17 LAB — BASIC METABOLIC PANEL
Anion gap: 7 (ref 5–15)
BUN: 6 mg/dL — ABNORMAL LOW (ref 8–23)
CO2: 23 mmol/L (ref 22–32)
Calcium: 7.8 mg/dL — ABNORMAL LOW (ref 8.9–10.3)
Chloride: 104 mmol/L (ref 98–111)
Creatinine, Ser: 0.47 mg/dL (ref 0.44–1.00)
GFR calc Af Amer: >60 mL/min (ref >60)
GFR calc non Af Amer: >60 mL/min (ref >60)
Glucose, Bld: 127 mg/dL — ABNORMAL HIGH (ref 70–99)
Potassium: 3.5 mmol/L (ref 3.5–5.1)
Sodium: 134 mmol/L — ABNORMAL LOW (ref 135–145)

## 2019-10-17 LAB — CBC
HCT: 28.1 % — ABNORMAL LOW (ref 36.0–46.0)
Hemoglobin: 9.1 g/dL — ABNORMAL LOW (ref 12.0–15.0)
MCH: 30.5 pg (ref 26.0–34.0)
MCHC: 32.4 g/dL (ref 30.0–36.0)
MCV: 94.3 fL (ref 80.0–100.0)
Platelets: 383 10*3/uL (ref 150–400)
RBC: 2.98 MIL/uL — ABNORMAL LOW (ref 3.87–5.11)
RDW: 13.5 % (ref 11.5–15.5)
WBC: 19.7 10*3/uL — ABNORMAL HIGH (ref 4.0–10.5)
nRBC: 0 % (ref 0.0–0.2)

## 2019-10-17 LAB — MAGNESIUM: Magnesium: 1.9 mg/dL (ref 1.7–2.4)

## 2019-10-17 NOTE — Telephone Encounter (Signed)
-----   Message from [place], MD sent at 10/17/2019  3:10 PM EST ----- Please arrange outpatient follow-up with Dr. Rush Landmark or [place] in 4-8 weeks. The patient has a Klebsiella pneumoniae liver abscess and possible endocarditis and needs to have a screening colonoscopy in the future. I recommend outpatient follow-up prior to scheduling the procedure.  Thank you.

## 2019-10-17 NOTE — Progress Notes (Signed)
Patient complaining of pain in area of liver drain.  JP drain flushed per MD order without difficulty.  Will continue to monitor.

## 2019-10-17 NOTE — TOC Progression Note (Signed)
Transition of Care (TOC) - Progression Note  Marvetta Gibbons RN, BSN Transitions of Care Unit 4E- RN Case Manager [phone]   Patient Details  Name: Courtney [name] MRN: [id number] Date of Birth: 1946/01/07  Transition of Care [facility]) CM/SW Contact  Dahlia Client, Romeo Rabon, RN Phone Number: 10/17/2019, 2:01 PM  Clinical Narrative:    Pt not stable for transition home today, call received from [facility] with [facility] that they will not be able to provide services if pt not going home today- as they can not do a [place] date past tomorrow until sometime later next week. [facility] has now declined referral. Spoke with Pam at [employer], education has been done with son for home IV abx needs- may be able to use [facility] if unable to secure another [facility] agency- pt also has JP drain that she will go home with-Son has been updated and is ok will reaching out to the other [facility] agencies that he gave as his choices. Will reach back out to other [facility] agency that son gave as preferences per previous CM note. Call made to [facility] with [facility]- and they are able to accept pt with a possible d/c date of tomorrow and a [place] date for Friday 11/6- Pam with [employer] will reach out to [place] with [facility] to coordinate care.    Expected Discharge Plan: [place] Barriers to Discharge: No Barriers Identified  Expected Discharge Plan and Services Expected Discharge Plan: [place] In-house Referral: NA Discharge Planning Services: CM Consult Post Acute Care Choice: [place] arrangements for the past 2 months: Single Family Home                 DME Arranged: (NA)         HH Arranged: RN [place] Agency: [facility] Date [place]: 10/15/19 Time Icard: 1232 Representative spoke with at [place]: [place] Determinants of Health ([place]) Interventions    Readmission Risk Interventions No flowsheet data found.

## 2019-10-17 NOTE — Progress Notes (Signed)
Patient ID: Courtney [name], female   DOB: Jul 30, 1946, 73 y.o.   MRN: [id number]  PROGRESS NOTE    Courtney [name]  ZWC:[id number] DOB: Nov 20, 1946 DOA: 10/06/2019 PCP: Patient, No Pcp Per   Brief Narrative:  73 year old female with history of diabetes mellitus type 2, paroxysmal A. fib and tachycardia/bradycardia syndrome status post PPM presented with fever, confusion.  On presentation, she was found to have DKA which was treated with insulin drip and subsequently changed to long-acting insulin.  During the hospitalization, she was found to have sepsis from Klebsiella bacteremia resulting in multifocal pneumonia, liver abscess along with valvular endocarditis.  She underwent ultrasound-guided paracentesis and ultrasound-guided liver abscess aspiration on 10/10/2019.  She subsequently underwent ultrasound guided drainage of hepatic abscess and drain placement by IR on 10/15/2019.  ID has recommended Rocephin till 11/26/2019.  CT surgery recommended continued antibiotic treatment and no valve repair at this time.  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  She subsequently underwent ultrasound guided drainage of hepatic abscess and drain placement by IR on 10/15/2019.   -ID has recommended Rocephin till 11/26/2019 for a total of 6 weeks of antibiotics.  CT surgery recommended continued antibiotic treatment and no valve repair at this time. -EP recommended medical therapy and surveillance cultures after therapy completed rather than lead removal now. -Outpatient follow-up with ID -Patient has a PICC line in place. -WBC improving but still at 19.7 today.  Repeat a.m. WBCs, if WBCs are improving, possible discharge tomorrow. -Outpatient follow-up with GI to arrange  for colonoscopy, given liver abscess -Sepsis has resolved  Acute metabolic encephalopathy -Most likely from sepsis.  Resolved  Transaminitis -Due to liver abscess.  Improving -Hepatitis A antibody reactive, hep B core antibody reactive.  Hep B surface antibody is also positive indicating likely previous infection  Paroxysmal A. 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  Continue Eliquis and flecainide.  Metoprolol on hold, will possibly restart on discharge  DKA: Resolved Diabetes mellitus type 2, uncontrolled with hyperglycemia -A1c 16.4.  Continue carb modified diet.  Continue Lantus along with insulin aspart with meals and sliding scale coverage  Anemia of chronic disease--hemoglobin stable.  ESBL positive and urine culture: Probably a colonizer as per ID.   DVT prophylaxis: Apixaban Code Status: Full Family Communication: Son at bedside Disposition Plan: Home tomorrow if WBCs improved.  Consultants: GI/ID/CT surgery/EP  Procedures: HIDA on 10/09/2019 TEE on 10/12/2019.  Antimicrobials:  Anti-infectives (From admission, onward)   Start     Dose/Rate Route Frequency Ordered Stop   10/13/19 1745  metroNIDAZOLE (FLAGYL) tablet 500 mg  Status:  Discontinued     500 mg Oral Every 8 hours 10/13/19 1740 10/15/19 1345   10/11/19 2200  cefTRIAXone (ROCEPHIN) 2 g in sodium chloride 0.9 % 100 mL IVPB     2 g 200 mL/hr over 30 Minutes Intravenous Every 24 hours 10/11/19 1150     10/08/19 1000  meropenem (MERREM) 1 g in sodium chloride 0.9 % 100 mL IVPB  Status:  Discontinued     1 g 200 mL/hr over 30 Minutes Intravenous Every 12 hours 10/08/19 0848 10/11/19 1150   10/07/19 1700  vancomycin (VANCOCIN) IVPB 750 mg/150 ml premix  Status:  Discontinued     750 mg 150 mL/hr over 60 Minutes Intravenous Every  24 hours 10/06/19 1608 10/07/19 0427   10/07/19 1700  vancomycin (VANCOCIN) IVPB 750 mg/150 ml premix  Status:   Discontinued     750 mg 150 mL/hr over 60 Minutes Intravenous Every 24 hours 10/07/19 1247 10/08/19 0836   10/07/19 1245  ampicillin (OMNIPEN) 2 g in sodium chloride 0.9 % 100 mL IVPB  Status:  Discontinued     2 g 300 mL/hr over 20 Minutes Intravenous Every 6 hours 10/07/19 1238 10/08/19 0836   10/07/19 0200  cefTRIAXone (ROCEPHIN) 2 g in sodium chloride 0.9 % 100 mL IVPB  Status:  Discontinued     2 g 200 mL/hr over 30 Minutes Intravenous Every 12 hours 10/06/19 1610 10/08/19 0836   10/06/19 1615  vancomycin (VANCOCIN) 1,250 mg in sodium chloride 0.9 % 250 mL IVPB     1,250 mg 166.7 mL/hr over 90 Minutes Intravenous  Once 10/06/19 1608 10/06/19 2111   10/06/19 1615  ampicillin (OMNIPEN) 2 g in sodium chloride 0.9 % 100 mL IVPB  Status:  Discontinued     2 g 300 mL/hr over 20 Minutes Intravenous Every 4 hours 10/06/19 1613 10/07/19 0427   10/06/19 1600  acyclovir (ZOVIRAX) 550 mg in dextrose 5 % 100 mL IVPB     550 mg 111 mL/hr over 60 Minutes Intravenous  Once 10/06/19 1558 10/06/19 2216   10/06/19 1230  cefTRIAXone (ROCEPHIN) 2 g in sodium chloride 0.9 % 100 mL IVPB     2 g 200 mL/hr over 30 Minutes Intravenous  Once 10/06/19 1215 10/06/19 1431       Subjective: Patient seen and examined at bedside.  Son present at bedside who intubated for the patient.  No overnight fever, nausea, vomiting or worsening abdominal pain.  Objective: Vitals:   10/15/19 2345 10/16/19 0340 10/16/19 1640 10/17/19 0400  BP: 93/76 (!) 115/58 (!) 142/62 114/73  Pulse: 92 88 73 73  Resp: 20  15   Temp: 99 F (37.2 C) 99.3 F (37.4 C) 98.3 F (36.8 C) 98.6 F (37 C)  TempSrc: Oral Oral Oral Oral  SpO2: 94% 94% 96% 96%  Weight:      Height:        Intake/Output Summary (Last 24 hours) at 10/17/2019 1052 Last data filed at 10/17/2019 0900 Gross per 24 hour  Intake 410 ml  Output 8 ml  Net 402 ml   Filed Weights   10/07/19 0142 10/08/19 0641 10/12/19 1303  Weight: 45.2 kg 47.7 kg 47.7 kg     Examination:  General exam: Appears calm and comfortable  Respiratory system: Bilateral decreased breath sounds at bases Cardiovascular system: S1 & S2 heard, Rate controlled Gastrointestinal system: Abdomen is nondistended, soft and mildly tender in the right upper quadrant.  Normal bowel sounds heard. Extremities: No cyanosis, clubbing, edema    Data Reviewed: I have personally reviewed following labs and imaging studies  CBC: Recent Labs  Lab 10/13/19 0314 10/14/19 0518 10/15/19 0500 10/16/19 0239 10/17/19 0433  WBC 20.4* 17.7* 16.7* 21.7* 19.7*  HGB 10.4* 9.2* 9.0* 8.7* 9.1*  HCT 30.9* 27.4* 27.2* 26.7* 28.1*  MCV 91.4 92.6 92.5 94.0 94.3  PLT 209 267 311 342 383   Basic Metabolic Panel: Recent Labs  Lab 10/11/19 1645  10/13/19 0314 10/14/19 0518 10/15/19 0500 10/16/19 0239 10/17/19 0433  NA 139   < > 136 135 135 135 134*  K 3.7   < > 3.4* 3.6 3.2* 3.7 3.5  CL 110   < > 103 105  104 101 104  CO2 21*   < > 21* GLUCOSE 129*   < > 169* 155* 138* 202* 127*  BUN 15   < > 6* 5* 6* 6* 6*  CREATININE 0.73   < > 0.61 0.54 0.49 0.71 0.47  CALCIUM 7.6*   < > 7.6* 7.5* 7.5* 7.6* 7.8*  MG 1.7  --   --  1.6* 1.7 1.7 1.9   < > = values in this interval not displayed.   GFR: Estimated Creatinine Clearance: 41 mL/min (by C-G formula based on SCr of 0.47 mg/dL). Liver Function Tests: Recent Labs  Lab 10/12/19 0235 10/13/19 0314 10/14/19 0518 10/15/19 0500 10/16/19 0239  AST 267* 180* 85* 56* 49*  ALT 334* 285* 193* 151* 114*  ALKPHOS 123 111 103 102 110  BILITOT 0.9 1.1 0.9 0.6 0.6  PROT 4.3* 5.1* 5.0* 5.5* 5.8*  ALBUMIN 1.6* 1.6* 1.6* 1.8* 1.7*   No results for input(s): LIPASE, AMYLASE in the last 168 hours. No results for input(s): AMMONIA in the last 168 hours. Coagulation Profile: No results for input(s): INR, PROTIME in the last 168 hours. Cardiac Enzymes: No results for input(s): CKTOTAL, CKMB, CKMBINDEX, TROPONINI in the last 168  hours. BNP (last 3 results) No results for input(s): PROBNP in the last 8760 hours. HbA1C: No results for input(s): HGBA1C in the last 72 hours. CBG: Recent Labs  Lab 10/16/19 0614 10/16/19 1150 10/16/19 1554 10/16/19 2136 10/17/19 0637  GLUCAP 179* 291* 310* 142* 139*   Lipid Profile: Recent Labs    10/16/19 0239  CHOL 110  HDL 14*  LDLCALC 77  TRIG 96  CHOLHDL 7.9   Thyroid Function Tests: No results for input(s): TSH, T4TOTAL, FREET4, T3FREE, THYROIDAB in the last 72 hours. Anemia Panel: No results for input(s): VITAMINB12, FOLATE, FERRITIN, TIBC, IRON, RETICCTPCT in the last 72 hours. Sepsis Labs: No results for input(s): PROCALCITON, LATICACIDVEN in the last 168 hours.  Recent Results (from the past 240 hour(s))  MRSA PCR Screening     Status: None   Collection Time: 10/08/19 10:50 PM   Specimen: Nasal Mucosa; Nasopharyngeal  Result Value Ref Range Status   MRSA by PCR NEGATIVE NEGATIVE Final    Comment:        The GeneXpert MRSA Assay (FDA approved for NASAL specimens only), is one component of a comprehensive MRSA colonization surveillance program. It is not intended to diagnose MRSA infection nor to guide or monitor treatment for MRSA infections. Performed at [facility], [address], [place], [place] [address]   Body fluid culture     Status: None   Collection Time: 10/10/19 12:59 PM   Specimen: A: Abdomen; Peritoneal Fluid   B: Abdomen; Peritoneal Fluid  Result Value Ref Range Status   Specimen Description ABDOMEN  Final   Special Requests NONE  Final   Gram Stain   Final    FEW WBC PRESENT,BOTH PMN AND MONONUCLEAR NO ORGANISMS SEEN    Culture   Final    NO GROWTH Performed at [facility], [address] [place], [place] [address]    Report Status 10/13/2019 FINAL  Final  Aerobic/Anaerobic Culture (surgical/deep wound)     Status: None   Collection Time: 10/10/19  2:26 PM   Specimen: Abscess  Result Value Ref Range  Status   Specimen Description ABSCESS LIVER  Final   Special Requests NONE  Final   Gram Stain   Final  ABUNDANT WBC PRESENT,BOTH PMN AND MONONUCLEAR FEW GRAM VARIABLE ROD RARE GRAM NEGATIVE RODS    Culture   Final    MODERATE KLEBSIELLA PNEUMONIAE NO ANAEROBES ISOLATED Performed at [facility], [address] [place], [place] [address]    Report Status 10/15/2019 FINAL  Final   Organism ID, Bacteria KLEBSIELLA PNEUMONIAE  Final      Susceptibility   Klebsiella pneumoniae - MIC*    AMPICILLIN >=32 RESISTANT Resistant     CEFAZOLIN <=4 SENSITIVE Sensitive     CEFEPIME <=1 SENSITIVE Sensitive     CEFTAZIDIME <=1 SENSITIVE Sensitive     CEFTRIAXONE <=1 SENSITIVE Sensitive     CIPROFLOXACIN <=0.25 SENSITIVE Sensitive     GENTAMICIN <=1 SENSITIVE Sensitive     IMIPENEM <=0.25 SENSITIVE Sensitive     TRIMETH/SULFA <=20 SENSITIVE Sensitive     AMPICILLIN/SULBACTAM 4 SENSITIVE Sensitive     PIP/TAZO <=4 SENSITIVE Sensitive     Extended ESBL NEGATIVE Sensitive     * MODERATE KLEBSIELLA PNEUMONIAE  Culture, blood (routine x 2)     Status: None   Collection Time: 10/11/19  5:05 PM   Specimen: BLOOD RIGHT ARM  Result Value Ref Range Status   Specimen Description BLOOD RIGHT ARM  Final   Special Requests AEROBIC BOTTLE ONLY Blood Culture adequate volume  Final   Culture   Final    NO GROWTH 5 DAYS Performed at [facility], [address] [place], [place] [address]    Report Status 10/16/2019 FINAL  Final  Culture, blood (routine x 2)     Status: None   Collection Time: 10/11/19  5:05 PM   Specimen: BLOOD RIGHT ARM  Result Value Ref Range Status   Specimen Description BLOOD RIGHT ARM  Final   Special Requests AEROBIC BOTTLE ONLY Blood Culture adequate volume  Final   Culture   Final    NO GROWTH 5 DAYS Performed at [facility], [address] [place], [place] [address]    Report Status 10/16/2019 FINAL  Final         Radiology Studies: Ir Guided  Horace Porteousrain W Catheter Placement  Result Date: 10/15/2019 INDICATION: Reaccumulation of right lobe hepatic abscess after prior ultrasound-guided aspiration and decompression on 10/10/2019. EXAM: ULTRASOUND-GUIDED PERCUTANEOUS CATHETER DRAINAGE OF HEPATIC ABSCESS MEDICATIONS: The patient is currently admitted to the hospital and receiving intravenous antibiotics. The antibiotics were administered within an appropriate time frame prior to the initiation of the procedure. ANESTHESIA/SEDATION: Fentanyl 37.5 mcg IV; Versed 0.5 mg IV Moderate Sedation Time:  14 minutes. The patient was continuously monitored during the procedure by the interventional radiology nurse under my direct supervision. COMPLICATIONS: None immediate. PROCEDURE: Informed written consent was obtained from the patient after a thorough discussion of the procedural risks, benefits and alternatives. All questions were addressed. Maximal Sterile Barrier Technique was utilized including caps, mask, sterile gowns, sterile gloves, sterile drape, hand hygiene and skin antiseptic. A timeout was performed prior to the initiation of the procedure. Ultrasound was performed of the liver. Under direct ultrasound guidance, an 18 gauge trocar needle was advanced to the level of a right lobe hepatic abscess. After return of fluid, contrast was injected under fluoroscopy to define the abscess cavity. A guidewire was advanced and the needle removed. The percutaneous tract was dilated over the wire and a 10 French percutaneous drain advanced. Final drainage catheter positioning was confirmed by fluoroscopy. The drain was flushed with saline and attached to a suction bulb.  It was secured at the skin with a Prolene retention suture. FINDINGS: Recurrent hepatic abscess yielded purulent fluid. Contrast injection demonstrates a very irregular abscess cavity with multiple irregular communicating pockets present. The 10 French drain was placed and is draining very thick, bloody  fluid after placement. IMPRESSION: Percutaneous catheter drainage of recurrent right hepatic abscess with placement of 10 French drainage catheter. There is return of thick, bloody fluid after drain placement. The drain was attached to a suction bulb. Electronically Signed   By: Irish Lack M.D.   On: 10/15/2019 16:25        Scheduled Meds:  apixaban  5 mg Oral BID   chlorhexidine  15 mL Mouth Rinse BID   Chlorhexidine Gluconate Cloth  6 each Topical Daily   Chlorhexidine Gluconate Cloth  6 each Topical Q0600   feeding supplement (ENSURE ENLIVE)  237 mL Oral BID BM   flecainide  50 mg Oral BID   insulin aspart  0-15 Units Subcutaneous TID WC   insulin aspart  0-5 Units Subcutaneous QHS   insulin aspart  4 Units Subcutaneous TID WC   insulin glargine  15 Units Subcutaneous Daily   mouth rinse  15 mL Mouth Rinse q12n4p   multivitamin with minerals  1 tablet Oral Daily   sodium chloride flush  10-40 mL Intracatheter Q12H   sodium chloride flush  5 mL Intracatheter Q8H   Continuous Infusions:  sodium chloride Stopped (10/09/19 0959)   cefTRIAXone (ROCEPHIN)  IV 2 g (10/16/19 1451)          Glade Lloyd, MD [facility] 10/17/2019, 10:52 AM

## 2019-10-18 DIAGNOSIS — I700 Atherosclerosis of aorta: Secondary | ICD-10-CM

## 2019-10-18 LAB — COMPREHENSIVE METABOLIC PANEL
ALT: 68 U/L — ABNORMAL HIGH (ref 0–44)
AST: 31 U/L (ref 15–41)
Albumin: 1.7 g/dL — ABNORMAL LOW (ref 3.5–5.0)
Alkaline Phosphatase: 104 U/L (ref 38–126)
Anion gap: 7 (ref 5–15)
BUN: 7 mg/dL — ABNORMAL LOW (ref 8–23)
CO2: 24 mmol/L (ref 22–32)
Calcium: 7.7 mg/dL — ABNORMAL LOW (ref 8.9–10.3)
Chloride: 104 mmol/L (ref 98–111)
Creatinine, Ser: 0.46 mg/dL (ref 0.44–1.00)
GFR calc Af Amer: >60 mL/min (ref >60)
GFR calc non Af Amer: >60 mL/min (ref >60)
Glucose, Bld: 115 mg/dL — ABNORMAL HIGH (ref 70–99)
Potassium: 3.6 mmol/L (ref 3.5–5.1)
Sodium: 135 mmol/L (ref 135–145)
Total Bilirubin: 0.5 mg/dL (ref 0.3–1.2)
Total Protein: 6.1 g/dL — ABNORMAL LOW (ref 6.5–8.1)

## 2019-10-18 LAB — CBC WITH DIFFERENTIAL/PLATELET
Abs Immature Granulocytes: 0.11 10*3/uL — ABNORMAL HIGH (ref 0.00–0.07)
Basophils Absolute: 0 10*3/uL (ref 0.0–0.1)
Basophils Relative: 0 %
Eosinophils Absolute: 0.1 10*3/uL (ref 0.0–0.5)
Eosinophils Relative: 1 %
HCT: 26 % — ABNORMAL LOW (ref 36.0–46.0)
Hemoglobin: 8.5 g/dL — ABNORMAL LOW (ref 12.0–15.0)
Immature Granulocytes: 1 %
Lymphocytes Relative: 14 %
Lymphs Abs: 2.5 10*3/uL (ref 0.7–4.0)
MCH: 30.4 pg (ref 26.0–34.0)
MCHC: 32.7 g/dL (ref 30.0–36.0)
MCV: 92.9 fL (ref 80.0–100.0)
Monocytes Absolute: 1.9 10*3/uL — ABNORMAL HIGH (ref 0.1–1.0)
Monocytes Relative: 11 %
Neutro Abs: 13.1 10*3/uL — ABNORMAL HIGH (ref 1.7–7.7)
Neutrophils Relative %: 73 %
Platelets: 389 10*3/uL (ref 150–400)
RBC: 2.8 MIL/uL — ABNORMAL LOW (ref 3.87–5.11)
RDW: 13.5 % (ref 11.5–15.5)
WBC: 17.8 10*3/uL — ABNORMAL HIGH (ref 4.0–10.5)
nRBC: 0 % (ref 0.0–0.2)

## 2019-10-18 LAB — MAGNESIUM: Magnesium: 1.8 mg/dL (ref 1.7–2.4)

## 2019-10-18 LAB — GLUCOSE, CAPILLARY: Glucose-Capillary: 133 mg/dL — ABNORMAL HIGH (ref 70–99)

## 2019-10-18 MED ORDER — INSULIN GLARGINE 100 UNIT/ML ~~LOC~~ SOLN: 20.0000 [IU] | Freq: Every day | SUBCUTANEOUS | 0 refills | Status: DC

## 2019-10-18 MED ORDER — HEPARIN SOD (PORK) LOCK FLUSH 100 UNIT/ML IV SOLN
250.0000 [IU] | INTRAVENOUS | Status: AC | PRN
  Administered 2019-10-18: 11:00:00 250 [IU]
  Filled 2019-10-18: qty 2.5

## 2019-10-18 MED ORDER — METOPROLOL TARTRATE 25 MG PO TABS: 12.5000 mg | ORAL_TABLET | Freq: Two times a day (BID) | ORAL | 0 refills | Status: DC

## 2019-10-18 MED ORDER — INSULIN ASPART 100 UNIT/ML ~~LOC~~ SOLN: 4.0000 [IU] | Freq: Three times a day (TID) | SUBCUTANEOUS | 0 refills | Status: DC

## 2019-10-18 MED ORDER — CEFTRIAXONE IV (FOR PTA / DISCHARGE USE ONLY): 2.0000 g | INTRAVENOUS | 0 refills | Status: DC

## 2019-10-18 MED ORDER — POLYETHYLENE GLYCOL 3350 17 G PO PACK: 17.0000 g | PACK | Freq: Every day | ORAL | 0 refills | Status: DC | PRN

## 2019-10-18 MED ORDER — "INSULIN SYRINGE 31G X 5/16"" 0.3 ML MISC": 0 refills | Status: DC

## 2019-10-18 MED ORDER — OXYCODONE HCL 5 MG PO TABS: 5.0000 mg | ORAL_TABLET | Freq: Four times a day (QID) | ORAL | 0 refills | Status: DC | PRN

## 2019-10-18 MED ORDER — SENNOSIDES-DOCUSATE SODIUM 8.6-50 MG PO TABS: 1.0000 | ORAL_TABLET | Freq: Two times a day (BID) | ORAL | 0 refills | Status: DC

## 2019-10-18 NOTE — Discharge Summary (Signed)
Physician Discharge Summary  Courtney [name] IZT:[id number] DOB: [birth year] DOA: 10/06/2019  PCP: Patient, No Pcp Per  Admit date: 10/06/2019 Discharge date: 10/18/2019  Admitted From: Home Disposition: Home  Recommendations for Outpatient Follow-up:  1. Follow up with PCP in 1 week  2. Outpatient follow-up with ID, IR and cardiology 3. Follow-up with IR for drain care 4. Follow up in ED if symptoms worsen or new appear   Home Health: RN Equipment/Devices: None  Discharge Condition: Stable CODE STATUS: Full Diet recommendation: Heart healthy/carb modified  Brief/Interim Summary: 73 year old female with history of diabetes mellitus type 2, paroxysmal A. fib and tachycardia/bradycardia syndrome status post PPM presented with fever, confusion.  On presentation, she was found to have DKA which was treated with insulin drip and subsequently changed to long-acting insulin.  During the hospitalization, she was found to have sepsis from Klebsiella bacteremia resulting in multifocal pneumonia, liver abscess along with valvular endocarditis.  She underwent ultrasound-guided paracentesis and ultrasound-guided liver abscess aspiration on 10/10/2019.  She subsequently underwent ultrasound guided drainage of hepatic abscess and drain placement by IR on 10/15/2019.  ID has recommended Rocephin till 11/26/2019.  CT surgery recommended continued antibiotic treatment and no valve repair at this time.  She will be discharged home to complete intravenous antibiotics via PICC line.  Outpatient follow-up with ID/IR/cardiology.   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  She subsequently underwent ultrasound guided drainage of hepatic abscess and drain placement  by IR on 10/15/2019.   -ID has recommended Rocephin till 11/26/2019 for a total of 6 weeks of antibiotics.  CT surgery recommended continued antibiotic treatment and no valve repair at this time. -EP recommended medical therapy and surveillance cultures after therapy completed rather than lead removal now. -Outpatient follow-up with ID -Patient has a PICC line in place. -WBC still elevated but improving to 17.8 today.    Outpatient follow-up. -Outpatient follow-up with GI to arrange for colonoscopy, given liver abscess -Sepsis has resolved -Discharge patient home today as patient has remained hemodynamically stable.  Acute metabolic encephalopathy -Most likely from sepsis.  Resolved  Transaminitis -Due to liver abscess.  Improving.  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  Continue Eliquis and flecainide.  Metoprolol on hold; will restart at a lower dose of 12.5 mg twice a day.  DKA: Resolved Diabetes mellitus type 2, uncontrolled with hyperglycemia -A1c 16.4.  Continue carb modified diet.  Continue Lantus along with insulin aspart with meals.  Outpatient follow-up.  Anemia of chronic disease--hemoglobin stable.  ESBL positive and urine culture: Probably a colonizer as per ID.  Discharge Instructions  Discharge Instructions    Ambulatory referral to Cardiology   Complete by: As directed    Afib/Endocarditis   Ambulatory referral to Infectious Disease   Complete by: As directed    Ambulatory referral to Interventional Radiology   Complete by: As directed    Liver drain followup   Diet - low sodium heart healthy   Complete by: As directed    Home infusion instructions [facility] May follow [place] Dosing Protocol; May administer  Cathflo as needed to maintain patency of vascular access device.; Flushing of vascular access  device: per [facility] Protocol: 0.9% NaCl pre/post medica...   Complete by: As directed    Instructions: May follow [place] Dosing Protocol   Instructions: May administer Cathflo as needed to maintain patency of vascular access device.   Instructions: Flushing of vascular access device: per [facility] Protocol: 0.9% NaCl pre/post medication administration and prn patency; Heparin 100 u/ml, 60m for implanted ports and Heparin 10u/ml, 536mfor all other central venous catheters.   Instructions: May follow [facility] Anaphylaxis Protocol for First Dose Administration in the home: 0.9% NaCl at 25-50 ml/hr to maintain IV access for protocol meds. Epinephrine 0.3 ml IV/IM PRN and Benadryl 25-50 IV/IM PRN s/s of anaphylaxis.   Instructions: [facility] Coordinator (RN) to assist per patient IV care needs in the home PRN.   Increase activity slowly   Complete by: As directed      Allergies as of 10/18/2019   No Known Allergies     Medication List    TAKE these medications   acetaminophen 325 MG tablet Commonly known as: TYLENOL Take 1-2 tablets (325-650 mg total) by mouth every 6 (six) hours as needed for mild pain or moderate pain.   apixaban 5 MG Tabs tablet Commonly known as: Eliquis Take 1 tablet (5 mg total) by mouth 2 (two) times daily.   cefTRIAXone  IVPB Commonly known as: ROCEPHIN Inject 2 g into the vein daily. Indication:  Klebsiella endocarditis/ bacteremia Last Day of Therapy:  11/26/2019 Labs - Once weekly:  CBC/D and BMP, Labs - Every other week:  ESR and CRP   flecainide 50 MG tablet Commonly known as: TAMBOCOR Take 1 tablet (50 mg total) by mouth 2 (two) times daily.   glucose monitoring kit monitoring kit Check cbgs bid as instructed by pcp.   insulin aspart 100 UNIT/ML injection Commonly known as: novoLOG Inject 4 Units into the skin 3 (three) times daily with meals.    insulin glargine 100 UNIT/ML injection Commonly known as: LANTUS Inject 0.2 mLs (20 Units total) into the skin daily.   INSULIN SYRINGE .3CC/31GX5/16" 31G X 5/16" 0.3 ML Misc Lantus 20units daily and Novolog 4 units TID with meals   metoprolol tartrate 25 MG tablet Commonly known as: LOPRESSOR Take 0.5 tablets (12.5 mg total) by mouth 2 (two) times daily. What changed:   medication strength  how much to take  additional instructions   oxyCODONE 5 MG immediate release tablet Commonly known as: Oxy IR/ROXICODONE Take 1 tablet (5 mg total) by mouth every 6 (six) hours as needed for severe pain.   polyethylene glycol 17 g packet Commonly known as: MiraLax Take 17 g by mouth daily as needed.   senna-docusate 8.6-50 MG tablet Commonly known as: Senokot S Take 1 tablet by mouth 2 (two) times daily.            Home Infusion Instuctions  (From admission, onward)         Start     Ordered   10/18/19 0000  Home infusion instructions [facility] May follow [place] Protocol; May administer Cathflo as needed to maintain patency of vascular access device.; Flushing of vascular access device: [facility]: 0.9% NaCl pre/post medica...    Question Answer Comment  Instructions May follow [place] Protocol   Instructions May administer Cathflo as needed to maintain patency of vascular access device.   Instructions Flushing of vascular access device: per [facility]: 0.9% NaCl pre/post medication administration and prn patency; Heparin 100  u/ml, 9m for implanted ports and Heparin 10u/ml, 515mfor all other central venous catheters.   Instructions May follow [facility] Anaphylaxis Protocol for First Dose Administration in the home: 0.9% NaCl at 25-50 ml/hr to maintain IV access for protocol meds. Epinephrine 0.3 ml IV/IM PRN and Benadryl 25-50 IV/IM PRN s/s of anaphylaxis.   Instructions [facility] Infusion Coordinator (RN) to assist per patient IV care  needs in the home PRN.      10/18/19 0915         Follow-up Information    [facility].   Why: at 11:00am Contact information: [phone]       [facility].   Why: [facility] IncContact information: [phone]      [facility] Follow up.   Why: pharmacy for home IV abx (will work with [employer]      MOGowrieSchedule an appointment as soon as possible for a visit.   Specialty: Radiology Why: at earliest covenience Contact information: [id number] [phone]       No Known Allergies  Consultations: GI/ID/CT surgery/EP   Procedures/Studies: Ct Head Wo Contrast  Result Date: 10/06/2019 CLINICAL DATA:  Altered level of consciousness and fever EXAM: CT HEAD WITHOUT CONTRAST TECHNIQUE: Contiguous axial images were obtained from the base of the skull through the vertex without intravenous contrast. COMPARISON:  None. FINDINGS: Brain: The ventricles are normal in size and configuration. There is no intracranial mass, hemorrhage, extra-axial fluid collection, or midline shift. Brain parenchyma appears unremarkable. No acute infarct evident. Vascular: There is no hyperdense vessel. There is calcification in each carotid siphon region. Skull: The bony calvarium appears intact. Sinuses/Orbits: There is mucosal thickening in several ethmoid air cells. Other visualized paranasal sinuses are clear. Visualized orbits appear symmetric bilaterally. Other: Mastoid air cells are clear. IMPRESSION: Brain parenchyma appears unremarkable. No evident acute infarct. No mass or hemorrhage. There are foci of arterial vascular calcification. There is mucosal thickening in several ethmoid air cells. Electronically  Signed   By: WiLowella GripII M.D.   On: 10/06/2019 14:55   Nm Hepatobiliary Liver Func  Result Date: 10/09/2019 CLINICAL DATA:  Upper abdominal pain and abnormal appearing gallbladder on recent ultrasound examination EXAM: NUCLEAR MEDICINE HEPATOBILIARY IMAGING TECHNIQUE: Sequential images of the abdomen were obtained out to 80 minutes following intravenous administration of radiopharmaceutical. RADIOPHARMACEUTICALS:  5.3 mCi Tc-9921mholetec IV COMPARISON:  Ultrasound right upper quadrant October 08, 2019 FINDINGS: Liver uptake of radiotracer is unremarkable. There is visualization of small bowel indicating patency of the common bile duct. Gallbladder visualizes, although somewhat delayed compared to visualization of the small bowel. IMPRESSION: There is visualization of the gallbladder which does indicate patency of the cystic duct. Relative delay in visualization of the gallbladder compared to small bowel may be a finding indicative of chronic cholecystitis. Common bile duct is patent as is evidenced by visualization of the small bowel. Comment: Note that radiotracer is seen in the right upper extremity due to apparent infiltration of intravenous access site earlier in the course of the study. Electronically Signed   By: WilLowella GripI M.D.   On: 10/09/2019 16:58   Ct Abdomen Pelvis W Contrast  Result Date: 10/14/2019 CLINICAL DATA:  Abnormal liver function tests. History of liver  abscess, completely aspirated under ultrasound guidance on 10/02/2019. EXAM: CT ABDOMEN AND PELVIS WITH CONTRAST TECHNIQUE: Multidetector CT imaging of the abdomen and pelvis was performed using the standard protocol following bolus administration of intravenous contrast. CONTRAST:  118m OMNIPAQUE IOHEXOL 300 MG/ML  SOLN COMPARISON:  10/07/2019 FINDINGS: Lower chest: Moderate-sized bilateral pleural effusions, increased. Progressive compressive atelectasis in both lower lobes. Mildly enlarged heart. Intracardiac  pacer wires. Hepatobiliary: Interval re-accumulation of a an irregular rim enhancing fluid collection in the right lobe of the liver, measuring 3.5 x 3.0 cm on image number 14 series 3 and 3.3 cm in length on coronal image number 58. There is wedge-shaped heterogeneous low density in the right lobe posterior to this fluid collection as well as tubular low density extending medially to the proximal portion of the right hepatic vein. There is a smaller oval fluid collection more medially, measuring 1.0 cm on image number 14 series 3. Normal appearing gallbladder. Pancreas: Unremarkable. No pancreatic ductal dilatation or surrounding inflammatory changes. Spleen: Normal in size without focal abnormality. Adrenals/Urinary Tract: Adrenal glands are unremarkable. Kidneys are normal, without renal calculi, focal lesion, or hydronephrosis. Bladder is unremarkable. Stomach/Bowel: Stomach is within normal limits. Appendix appears normal. No evidence of bowel wall thickening, distention, or inflammatory changes. Prominent stool in the colon. Vascular/Lymphatic: No significant vascular findings are present. No enlarged abdominal or pelvic lymph nodes. Reproductive: Uterus and bilateral adnexa are unremarkable. Other: Bilateral subcutaneous edema. No free peritoneal fluid or air. Musculoskeletal: Stable L3 and L4 vertebral body minimal compression deformities and endplate irregularity with multiple small Schmorl's nodes. Mild lower thoracic spine degenerative changes. IMPRESSION: 1. Interval re-accumulation of an irregular rim enhancing fluid collection in the right lobe of the liver, compatible with an abscess measuring 3.5 x 3.3 x 3.0 cm. 2. Additional smaller probable abscess in the right lobe of the liver, as described above. 3. Changes compatible with parenchymal infection in the right lobe of the liver with linear extension medially. 4. Moderate-sized bilateral pleural effusions, increased. 5. Progressive compressive  atelectasis in both lower lobes. 6. Bilateral subcutaneous edema. Electronically Signed   By: SClaudie ReveringM.D.   On: 10/14/2019 12:34   Ct Abdomen Pelvis W Contrast  Result Date: 10/08/2019 CLINICAL DATA:  73year old female with bacteremia. EXAM: CT ABDOMEN AND PELVIS WITH CONTRAST TECHNIQUE: Multidetector CT imaging of the abdomen and pelvis was performed using the standard protocol following bolus administration of intravenous contrast. CONTRAST:  1057mOMNIPAQUE IOHEXOL 300 MG/ML  SOLN COMPARISON:  None. FINDINGS: Evaluation of this exam is limited due to respiratory motion artifact. Lower chest: Partially visualized small bilateral pleural effusions with associated partial compressive atelectasis of the lower lobes. Multiple patchy areas of airspace consolidation in the visualized lung fields most consistent with multifocal pneumonia. Several peripheral and subpleural airspace opacity may have central cavitation (series 3, image 1) and may represent fungal infection, abscesses, or septic emboli. Clinical correlation is recommended. Cardiac pacemaker wires noted. No intra-abdominal free air. There is a small free fluid within the pelvis as well as a small perihepatic free fluid. Hepatobiliary: There is heterogeneous enhancement of the liver. Two adjacent hypoenhancing areas noted in the right lobe of the liver each measuring approximately 2.4 x 2.4 cm concerning for areas of infection or developing abscesses. There is mild periportal edema. There is diffuse thickening of the gallbladder wall with pericholecystic fluid. No definite calcified stone identified. Evaluation of the gallbladder is very limited due to respiratory motion artifact. Sludge or noncalcified stone may be  present within the gallbladder. Further evaluation with ultrasound or MRCP is recommended. Pancreas: Unremarkable. No pancreatic ductal dilatation or surrounding inflammatory changes. Spleen: Normal in size without focal abnormality.  Adrenals/Urinary Tract: The adrenal glands are unremarkable. There is no hydronephrosis on either side. There is symmetric enhancement and excretion of contrast by both kidneys. The visualized ureters and urinary bladder appear unremarkable. Stomach/Bowel: There is no bowel obstruction. Mild diffuse thickened appearance of the colonic wall may be related to underdistention. Clinical correlation is recommended to evaluate for mild colitis. The appendix is normal. Vascular/Lymphatic: Mild aortoiliac atherosclerotic disease. The IVC is unremarkable. No portal venous gas. Mildly enlarged periportal lymph nodes, reactive. Reproductive: The uterus is grossly unremarkable. No adnexal masses. Other: There is diffuse subcutaneous edema. No fluid collection. Musculoskeletal: Degenerative changes of the spine. No acute osseous pathology. IMPRESSION: 1. Small bilateral pleural effusions and findings of multifocal pneumonia. Clinical correlation is recommended. 2. Diffuse gallbladder wall edema and pericholecystic fluid. Additionally there is periportal edema and findings concerning for developing intrahepatic abscesses. Findings may represent cholangitis. Clinical correlation and further evaluation with MRCP is recommended. 3. Underdistention of the colon versus less likely mild colitis. Clinical correlation is recommended. No bowel obstruction. Normal appendix. Aortic Atherosclerosis (ICD10-I70.0). Electronically Signed   By: Anner Crete M.D.   On: 10/08/2019 00:18   Ir Guided Niel Hummer W Catheter Placement  Result Date: 10/15/2019 INDICATION: Reaccumulation of right lobe hepatic abscess after prior ultrasound-guided aspiration and decompression on 10/10/2019. EXAM: ULTRASOUND-GUIDED PERCUTANEOUS CATHETER DRAINAGE OF HEPATIC ABSCESS MEDICATIONS: The patient is currently admitted to the hospital and receiving intravenous antibiotics. The antibiotics were administered within an appropriate time frame prior to the initiation  of the procedure. ANESTHESIA/SEDATION: Fentanyl 37.5 mcg IV; Versed 0.5 mg IV Moderate Sedation Time:  14 minutes. The patient was continuously monitored during the procedure by the interventional radiology nurse under my direct supervision. COMPLICATIONS: None immediate. PROCEDURE: Informed written consent was obtained from the patient after a thorough discussion of the procedural risks, benefits and alternatives. All questions were addressed. Maximal Sterile Barrier Technique was utilized including caps, mask, sterile gowns, sterile gloves, sterile drape, hand hygiene and skin antiseptic. A timeout was performed prior to the initiation of the procedure. Ultrasound was performed of the liver. Under direct ultrasound guidance, an 18 gauge trocar needle was advanced to the level of a right lobe hepatic abscess. After return of fluid, contrast was injected under fluoroscopy to define the abscess cavity. A guidewire was advanced and the needle removed. The percutaneous tract was dilated over the wire and a 10 French percutaneous drain advanced. Final drainage catheter positioning was confirmed by fluoroscopy. The drain was flushed with saline and attached to a suction bulb. It was secured at the skin with a Prolene retention suture. FINDINGS: Recurrent hepatic abscess yielded purulent fluid. Contrast injection demonstrates a very irregular abscess cavity with multiple irregular communicating pockets present. The 10 French drain was placed and is draining very thick, bloody fluid after placement. IMPRESSION: Percutaneous catheter drainage of recurrent right hepatic abscess with placement of 10 French drainage catheter. There is return of thick, bloody fluid after drain placement. The drain was attached to a suction bulb. Electronically Signed   By: Aletta Edouard M.D.   On: 10/15/2019 16:25   US Abdomen Limited  Result Date: 10/08/2019 CLINICAL DATA:  Ascending cholangitis.  Abnormal CT. EXAM: ULTRASOUND ABDOMEN  LIMITED RIGHT UPPER QUADRANT COMPARISON:  None. FINDINGS: Gallbladder: There is gallbladder wall thickening with the gallbladder measuring approximately 3-4  mm. There is pericholecystic free fluid. Gallbladder sludge is noted. The sonographic Percell Miller sign was not reliably evaluated secondary to patient condition. Common bile duct: Diameter: 3 mm Liver: There is a 5 x 2.5 x 4.8 cm collection in the right hepatic lobe. This collection demonstrates no significant internal color Doppler flow. Portal vein is patent on color Doppler imaging with normal direction of blood flow towards the liver. Other: There is a right-sided pleural effusion. IMPRESSION: 1. Gallbladder sludge with pericholecystic free fluid and gallbladder wall thickening. Findings could represent acute cholecystitis in the appropriate clinical setting. The sonographic Percell Miller sign could not be reliably assessed. Further evaluation can be performed with HIDA scan as clinically indicated. 2. Complex collection in the right hepatic lobe as detailed above, again concerning for developing intrahepatic abscess in the appropriate clinical setting. 3. Incidentally noted right-sided pleural effusion. Electronically Signed   By: Constance Holster M.D.   On: 10/08/2019 21:02   Ir US Guide Bx Asp/drain  Result Date: 10/10/2019 INDICATION: 73 year old with bacteremia and concern for cholangitis and hepatic abscess. Cystic duct was patent on recent HIDA examination. EXAM: ULTRASOUND-GUIDED PARACENTESIS ULTRASOUND-GUIDED HEPATIC ABSCESS ASPIRATION MEDICATIONS: The patient is currently admitted to the hospital and receiving intravenous antibiotics. ANESTHESIA/SEDATION: Fentanyl 12.5 mcg IV; Versed 0.5 mg IV Moderate Sedation Time:  12 minutes The patient was continuously monitored during the procedure by the interventional radiology nurse under my direct supervision. COMPLICATIONS: None immediate. PROCEDURE: Informed written consent was obtained from the patient's  son after a thorough discussion of the procedural risks, benefits and alternatives. All questions were addressed. Maximal Sterile Barrier Technique was utilized including caps, mask, sterile gowns, sterile gloves, sterile drape, hand hygiene and skin antiseptic. A timeout was performed prior to the initiation of the procedure. Patient was placed supine on the interventional table. Ultrasound was used to evaluate the abdomen. New perihepatic ascites was identified. Skin was anesthetized with 1% lidocaine. Using ultrasound guidance, a 19 gauge Yueh catheter was directed into the perihepatic space and a paracentesis was performed. The paracentesis catheter was kept in place as attention was directed to the intrahepatic lesion. Ultrasound demonstrated irregular hypoechoic areas in liver concerning for intrahepatic abscess. After the perihepatic ascites had been removed, a new 19 gauge Yueh catheter was directed into the liver and into the hypoechoic hepatic area with ultrasound guidance. Tan colored purulent fluid was aspirated. Approximately 10 mL of purulent fluid was removed from the liver. Following the aspiration, there was no significant collection remaining. Stiff Amplatz wire was advanced into the liver but with the wire was not forming within a well-defined collection. Drain was not placed. Yueh catheter was removed. Small amount of bloody material was removed from the paracentesis catheter prior to removal. Bandage placed over the puncture sites. FINDINGS: New perihepatic ascites. Approximately 220 mL of predominantly yellow fluid was removed from the perihepatic space. Small amount of blood was aspirated from the paracentesis catheter after the liver aspiration. Small irregular hypoechoic collections in the liver. Approximately 10 mL of purulent fluid was removed from these collections and findings are compatible with a poorly formed hepatic abscess. The hepatic abscess was decompressed following aspiration.  IMPRESSION: 1. Ultrasound-guided aspiration of the intrahepatic abscess/abscesses. Total of 10 mL of purulent fluid was removed. There was not a well-defined collection within the liver based on wire placement. In addition, the liver abscess appeared to be completely decompressed by the end of the procedure. Therefore, hepatic drain was not placed. In addition, there is concern about placing  an intrahepatic drain in the setting of ascites. Abscess fluid was sent for culture. 2. Ultrasound-guided paracentesis. Approximately 220 mL of peritoneal fluid was removed. Fluid was sent for analysis. Electronically Signed   By: Markus Daft M.D.   On: 10/10/2019 15:12   Dg Chest Port 1 View  Result Date: 10/08/2019 CLINICAL DATA:  Hypoxia and shortness of breath. EXAM: PORTABLE CHEST 1 VIEW COMPARISON:  10/07/2019 and 10/06/2019 FINDINGS: Left-sided pacemaker unchanged. Lungs are adequately inflated demonstrate continued mild patchy bilateral airspace process with slight worsening over the left midlung and left upper lobe. No effusion. Cardiomediastinal silhouette and remainder of the exam is unchanged. IMPRESSION: Slight interval worsening bilateral patchy airspace process likely multifocal infection. Electronically Signed   By: Marin Olp M.D.   On: 10/08/2019 16:00   Dg Chest Port 1 View  Result Date: 10/07/2019 CLINICAL DATA:  Shortness of breath EXAM: PORTABLE CHEST 1 VIEW COMPARISON:  10/06/2019 FINDINGS: The heart size and mediastinal contours are within normal limits. Left chest multi lead pacer. Increased multifocal heterogeneous opacity of the bilateral mid lungs and perihilar right lung. The visualized skeletal structures are unremarkable. IMPRESSION: Increased multifocal heterogeneous opacity of the bilateral mid lungs and perihilar right lung, concerning for multifocal infection. Electronically Signed   By: Eddie Candle M.D.   On: 10/07/2019 19:26   Dg Chest Port 1 View  Result Date:  10/06/2019 CLINICAL DATA:  Altered mental status EXAM: PORTABLE CHEST 1 VIEW COMPARISON:  09/06/2017 FINDINGS: There is no focal consolidation. There is no pleural effusion or pneumothorax. The heart and mediastinal contours are unremarkable. 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 There is thoracic aortic atherosclerosis. There is no acute osseous abnormality. IMPRESSION: No active disease. Electronically Signed   By: Kathreen Devoid   On: 10/06/2019 12:49   [place] Ekg Site Rite  Result Date: 10/12/2019 If Site Rite image not attached, placement could not be confirmed due to current cardiac rhythm.  [place] Ekg Site Rite  Result Date: 10/07/2019 If Site Rite image not attached, placement could not be confirmed due to current cardiac rhythm.  Ir Paracentesis  Result Date: 10/10/2019 INDICATION: 73 year old with bacteremia and concern for cholangitis and hepatic abscess. Cystic duct was patent on recent HIDA examination. EXAM: ULTRASOUND-GUIDED PARACENTESIS ULTRASOUND-GUIDED HEPATIC ABSCESS ASPIRATION MEDICATIONS: The patient is currently admitted to the hospital and receiving intravenous antibiotics. ANESTHESIA/SEDATION: Fentanyl 12.5 mcg IV; Versed 0.5 mg IV Moderate Sedation Time:  12 minutes The patient was continuously monitored during the procedure by the interventional radiology nurse under my direct supervision. COMPLICATIONS: None immediate. PROCEDURE: Informed written consent was obtained from the patient's son after a thorough discussion of the procedural risks, benefits and alternatives. All questions were addressed. Maximal Sterile Barrier Technique was utilized including caps, mask, sterile gowns, sterile gloves, sterile drape, hand hygiene and skin antiseptic. A timeout was performed prior to the initiation of the procedure. Patient was placed supine on the interventional table. Ultrasound was used to evaluate the abdomen. New perihepatic ascites was identified. Skin was anesthetized with 1%  lidocaine. Using ultrasound guidance, a 19 gauge Yueh catheter was directed into the perihepatic space and a paracentesis was performed. The paracentesis catheter was kept in place as attention was directed to the intrahepatic lesion. Ultrasound demonstrated irregular hypoechoic areas in liver concerning for intrahepatic abscess. After the perihepatic ascites had been removed, a new 19 gauge Yueh catheter was directed into the liver and into the hypoechoic hepatic area with ultrasound guidance. Tan colored purulent fluid was  aspirated. Approximately 10 mL of purulent fluid was removed from the liver. Following the aspiration, there was no significant collection remaining. Stiff Amplatz wire was advanced into the liver but with the wire was not forming within a well-defined collection. Drain was not placed. Yueh catheter was removed. Small amount of bloody material was removed from the paracentesis catheter prior to removal. Bandage placed over the puncture sites. FINDINGS: New perihepatic ascites. Approximately 220 mL of predominantly yellow fluid was removed from the perihepatic space. Small amount of blood was aspirated from the paracentesis catheter after the liver aspiration. Small irregular hypoechoic collections in the liver. Approximately 10 mL of purulent fluid was removed from these collections and findings are compatible with a poorly formed hepatic abscess. The hepatic abscess was decompressed following aspiration. IMPRESSION: 1. Ultrasound-guided aspiration of the intrahepatic abscess/abscesses. Total of 10 mL of purulent fluid was removed. There was not a well-defined collection within the liver based on wire placement. In addition, the liver abscess appeared to be completely decompressed by the end of the procedure. Therefore, hepatic drain was not placed. In addition, there is concern about placing an intrahepatic drain in the setting of ascites. Abscess fluid was sent for culture. 2.  Ultrasound-guided paracentesis. Approximately 220 mL of peritoneal fluid was removed. Fluid was sent for analysis. Electronically Signed   By: Markus Daft M.D.   On: 10/10/2019 15:12    TEE on 10/12/2019.   Subjective: Patient seen and examined at bedside.  She denies any worsening abdominal pain, nausea or vomiting.  No overnight fever.  Tolerating diet.  Discharge Exam: Vitals:   10/17/19 2126 10/18/19 0413  BP: (!) 110/59 (!) 117/57  Pulse: 73 69  Resp: 20 16  Temp: 99.1 F (37.3 C) 98.2 F (36.8 C)  SpO2: 100% 96%    General: Pt is alert, awake, not in acute distress Cardiovascular: rate controlled, S1/S2 + Respiratory: bilateral decreased breath sounds at bases Abdominal: Soft, mild right upper quadrant tenderness present, ND, bowel sounds +.  Right upper quadrant drain present. Extremities: no edema, no cyanosis    The results of significant diagnostics from this hospitalization (including imaging, microbiology, ancillary and laboratory) are listed below for reference.     Microbiology: Recent Results (from the past 240 hour(s))  MRSA PCR Screening     Status: None   Collection Time: 10/08/19 10:50 PM   Specimen: Nasal Mucosa; Nasopharyngeal  Result Value Ref Range Status   MRSA by PCR NEGATIVE NEGATIVE Final    Comment:        The GeneXpert MRSA Assay (FDA approved for NASAL specimens only), is one component of a comprehensive MRSA colonization surveillance program. It is not intended to diagnose MRSA infection nor to guide or monitor treatment for MRSA infections. Performed at [facility], [place] [address] [place], [place] [address]   Body fluid culture     Status: None   Collection Time: 10/10/19 12:59 PM   Specimen: A: Abdomen; Peritoneal Fluid   B: Abdomen; Peritoneal Fluid  Result Value Ref Range Status   Specimen Description ABDOMEN  Final   Special Requests NONE  Final   Gram Stain   Final    FEW WBC PRESENT,BOTH PMN AND MONONUCLEAR NO  ORGANISMS SEEN    Culture   Final    NO GROWTH Performed at [facility], [address] [place], [place] [address]    Report Status 10/13/2019 FINAL  Final  Aerobic/Anaerobic Culture (surgical/deep wound)  Status: None   Collection Time: 10/10/19  2:26 PM   Specimen: Abscess  Result Value Ref Range Status   Specimen Description ABSCESS LIVER  Final   Special Requests NONE  Final   Gram Stain   Final    ABUNDANT WBC PRESENT,BOTH PMN AND MONONUCLEAR FEW GRAM VARIABLE ROD RARE GRAM NEGATIVE RODS    Culture   Final    MODERATE KLEBSIELLA PNEUMONIAE NO ANAEROBES ISOLATED Performed at [facility], [place] [address] [place], [place] [address]    Report Status 10/15/2019 FINAL  Final   Organism ID, Bacteria KLEBSIELLA PNEUMONIAE  Final      Susceptibility   Klebsiella pneumoniae - MIC*    AMPICILLIN >=32 RESISTANT Resistant     CEFAZOLIN <=4 SENSITIVE Sensitive     CEFEPIME <=1 SENSITIVE Sensitive     CEFTAZIDIME <=1 SENSITIVE Sensitive     CEFTRIAXONE <=1 SENSITIVE Sensitive     CIPROFLOXACIN <=0.25 SENSITIVE Sensitive     GENTAMICIN <=1 SENSITIVE Sensitive     IMIPENEM <=0.25 SENSITIVE Sensitive     TRIMETH/SULFA <=20 SENSITIVE Sensitive     AMPICILLIN/SULBACTAM 4 SENSITIVE Sensitive     PIP/TAZO <=4 SENSITIVE Sensitive     Extended ESBL NEGATIVE Sensitive     * MODERATE KLEBSIELLA PNEUMONIAE  Culture, blood (routine x 2)     Status: None   Collection Time: 10/11/19  5:05 PM   Specimen: BLOOD RIGHT ARM  Result Value Ref Range Status   Specimen Description BLOOD RIGHT ARM  Final   Special Requests AEROBIC BOTTLE ONLY Blood Culture adequate volume  Final   Culture   Final    NO GROWTH 5 DAYS Performed at [facility], [address], [place], [place] [address]    Report Status 10/16/2019 FINAL  Final  Culture, blood (routine x 2)     Status: None   Collection Time: 10/11/19  5:05 PM   Specimen: BLOOD RIGHT ARM  Result Value Ref Range Status    Specimen Description BLOOD RIGHT ARM  Final   Special Requests AEROBIC BOTTLE ONLY Blood Culture adequate volume  Final   Culture   Final    NO GROWTH 5 DAYS Performed at [facility], [place] [address] [place], [place] [address]    Report Status 10/16/2019 FINAL  Final     Labs: BNP (last 3 results) No results for input(s): BNP in the last 8760 hours. Basic Metabolic Panel: Recent Labs  Lab 10/14/19 0518 10/15/19 0500 10/16/19 0239 10/17/19 0433 10/18/19 0422  NA 135 135 135 134* 135  K 3.6 3.2* 3.7 3.5 3.6  CL 105 104 101 104 104  CO2 '24 25 24 23 24  '$ GLUCOSE 155* 138* 202* 127* 115*  BUN 5* 6* 6* 6* 7*  CREATININE 0.54 0.49 0.71 0.47 0.46  CALCIUM 7.5* 7.5* 7.6* 7.8* 7.7*  MG 1.6* 1.7 1.7 1.9 1.8   Liver Function Tests: Recent Labs  Lab 10/13/19 0314 10/14/19 0518 10/15/19 0500 10/16/19 0239 10/18/19 0422  AST 180* 85* 56* 49* 31  ALT 285* 193* 151* 114* 68*  ALKPHOS 111 103 102 110 104  BILITOT 1.1 0.9 0.6 0.6 0.5  PROT 5.1* 5.0* 5.5* 5.8* 6.1*  ALBUMIN 1.6* 1.6* 1.8* 1.7* 1.7*   No results for input(s): LIPASE, AMYLASE in the last 168 hours. No results for input(s): AMMONIA in the last 168 hours. CBC: Recent Labs  Lab 10/14/19 0518 10/15/19 0500 10/16/19 0239 10/17/19 0433 10/18/19 0422  WBC  17.7* 16.7* 21.7* 19.7* 17.8*  NEUTROABS  --   --   --   --  13.1*  HGB 9.2* 9.0* 8.7* 9.1* 8.5*  HCT 27.4* 27.2* 26.7* 28.1* 26.0*  MCV 92.6 92.5 94.0 94.3 92.9  PLT 267 311 342 383 389   Cardiac Enzymes: No results for input(s): CKTOTAL, CKMB, CKMBINDEX, TROPONINI in the last 168 hours. BNP: Invalid input(s): POCBNP CBG: Recent Labs  Lab 10/17/19 0637 10/17/19 1119 10/17/19 1606 10/17/19 2123 10/18/19 0620  GLUCAP 139* 329* 233* 320* 133*   D-Dimer No results for input(s): DDIMER in the last 72 hours. Hgb A1c No results for input(s): HGBA1C in the last 72 hours. Lipid Profile Recent Labs    10/16/19 0239  CHOL 110  HDL 14*  LDLCALC 77   TRIG 96  CHOLHDL 7.9   Thyroid function studies No results for input(s): TSH, T4TOTAL, T3FREE, THYROIDAB in the last 72 hours.  Invalid input(s): FREET3 Anemia work up No results for input(s): VITAMINB12, FOLATE, FERRITIN, TIBC, IRON, RETICCTPCT in the last 72 hours. Urinalysis    Component Value Date/Time   COLORURINE YELLOW 10/10/2019 [place] 10/10/2019 0445   LABSPEC 1.021 10/10/2019 0445   PHURINE 6.0 10/10/2019 0445   GLUCOSEU 50 (A) 10/10/2019 0445   HGBUR NEGATIVE 10/10/2019 0445   BILIRUBINUR NEGATIVE 10/10/2019 0445   BILIRUBINUR neg 05/05/2015 1038   KETONESUR 5 (A) 10/10/2019 0445   PROTEINUR NEGATIVE 10/10/2019 0445   UROBILINOGEN 0.2 05/05/2015 1038   NITRITE NEGATIVE 10/10/2019 0445   LEUKOCYTESUR NEGATIVE 10/10/2019 0445   Sepsis Labs Invalid input(s): PROCALCITONIN,  WBC,  LACTICIDVEN Microbiology Recent Results (from the past 240 hour(s))  MRSA PCR Screening     Status: None   Collection Time: 10/08/19 10:50 PM   Specimen: Nasal Mucosa; Nasopharyngeal  Result Value Ref Range Status   MRSA by PCR NEGATIVE NEGATIVE Final    Comment:        The GeneXpert MRSA Assay (FDA approved for NASAL specimens only), is one component of a comprehensive MRSA colonization surveillance program. It is not intended to diagnose MRSA infection nor to guide or monitor treatment for MRSA infections. Performed at [facility], [place] [address], [place], [place] [address]   Body fluid culture     Status: None   Collection Time: 10/10/19 12:59 PM   Specimen: A: Abdomen; Peritoneal Fluid   B: Abdomen; Peritoneal Fluid  Result Value Ref Range Status   Specimen Description ABDOMEN  Final   Special Requests NONE  Final   Gram Stain   Final    FEW WBC PRESENT,BOTH PMN AND MONONUCLEAR NO ORGANISMS SEEN    Culture   Final    NO GROWTH Performed at [facility], [address], [place], [place] [address]    Report Status 10/13/2019 FINAL   Final  Aerobic/Anaerobic Culture (surgical/deep wound)     Status: None   Collection Time: 10/10/19  2:26 PM   Specimen: Abscess  Result Value Ref Range Status   Specimen Description ABSCESS LIVER  Final   Special Requests NONE  Final   Gram Stain   Final    ABUNDANT WBC PRESENT,BOTH PMN AND MONONUCLEAR FEW GRAM VARIABLE ROD RARE GRAM NEGATIVE RODS    Culture   Final    MODERATE KLEBSIELLA PNEUMONIAE NO ANAEROBES ISOLATED Performed at [facility], [place] [address], [place], [place] [address]    Report Status 10/15/2019 FINAL  Final   Organism ID,  Bacteria KLEBSIELLA PNEUMONIAE  Final      Susceptibility   Klebsiella pneumoniae - MIC*    AMPICILLIN >=32 RESISTANT Resistant     CEFAZOLIN <=4 SENSITIVE Sensitive     CEFEPIME <=1 SENSITIVE Sensitive     CEFTAZIDIME <=1 SENSITIVE Sensitive     CEFTRIAXONE <=1 SENSITIVE Sensitive     CIPROFLOXACIN <=0.25 SENSITIVE Sensitive     GENTAMICIN <=1 SENSITIVE Sensitive     IMIPENEM <=0.25 SENSITIVE Sensitive     TRIMETH/SULFA <=20 SENSITIVE Sensitive     AMPICILLIN/SULBACTAM 4 SENSITIVE Sensitive     PIP/TAZO <=4 SENSITIVE Sensitive     Extended ESBL NEGATIVE Sensitive     * MODERATE KLEBSIELLA PNEUMONIAE  Culture, blood (routine x 2)     Status: None   Collection Time: 10/11/19  5:05 PM   Specimen: BLOOD RIGHT ARM  Result Value Ref Range Status   Specimen Description BLOOD RIGHT ARM  Final   Special Requests AEROBIC BOTTLE ONLY Blood Culture adequate volume  Final   Culture   Final    NO GROWTH 5 DAYS Performed at [facility], [address] [place], [place] [address]    Report Status 10/16/2019 FINAL  Final  Culture, blood (routine x 2)     Status: None   Collection Time: 10/11/19  5:05 PM   Specimen: BLOOD RIGHT ARM  Result Value Ref Range Status   Specimen Description BLOOD RIGHT ARM  Final   Special Requests AEROBIC BOTTLE ONLY Blood Culture adequate volume  Final   Culture   Final    NO GROWTH 5  DAYS Performed at [facility], [place] [address] [place], [place] [address]    Report Status 10/16/2019 FINAL  Final     Time coordinating discharge: 35 minutes  SIGNED:   Aline August, MD  [facility] 10/18/2019, 10:08 AM

## 2019-10-18 NOTE — TOC Transition Note (Signed)
Transition of Care [facility]) - CM/SW Discharge Note Marvetta Gibbons RN, BSN Transitions of Care Unit 4E- RN Case Manager [phone]   Patient Details  Name: Courtney [name] MRN: [id number] Date of Birth: [birth year]  Transition of Care [facility]) CM/SW Contact:  Dawayne Patricia, RN Phone Number: 10/18/2019, 10:03 AM   Clinical Narrative:    Pt stable for transition home today, son to provide transport home. Pt will receive today's dose of IV abx here prior to leaving hospital and then [facility] will to [facility] tomorrow 11/6. Spoke with Pam who has done education with son at the bedside for home abx needs. [facility] and [facility] will provide HH needs for pt.    Final next level of care: [place] Barriers to Discharge: No Barriers Identified   Patient Goals and CMS Choice Patient states their goals for this hospitalization and ongoing recovery are:: home with [facility] CMS Medicare.gov Compare Post Acute Care list provided to:: Patient Represenative (must comment)(son, Minh) Choice offered to / list presented to : Adult Children  Discharge Placement          Home with [facility]            Discharge Plan and Services In-house Referral: NA Discharge Planning Services: CM Consult Post Acute Care Choice: Home Health          DME Arranged: (NA) DME Agency: NA       HH Arranged: IV Antibiotics HH Agency: [place], [facility] Date [facility] Agency Contacted: 10/17/19 Time [place]: 1315 Representative spoke with at [place]: [place] ([place]) Interventions     Readmission Risk Interventions Readmission Risk Prevention Plan 10/18/2019  Transportation Screening Complete  PCP or Specialist Appt within 5-7 Days Complete  Home Care Screening Complete  Medication Review (RN CM) Complete  Some recent data might be hidden

## 2019-10-18 NOTE — Progress Notes (Signed)
Remote pacemaker transmission.   

## 2019-10-18 NOTE — Progress Notes (Signed)
Progress Note  Patient Name: Courtney [name] Date of Encounter: 10/18/2019  Primary Cardiologist: Chilton Si, MD   Subjective   Feeling well.  She is feeling generally tired and weak.  She walked earlier today and felt well other than being tired.  Inpatient Medications    Scheduled Meds: . apixaban  5 mg Oral BID  . chlorhexidine  15 mL Mouth Rinse BID  . Chlorhexidine Gluconate Cloth  6 each Topical Daily  . Chlorhexidine Gluconate Cloth  6 each Topical Q0600  . flecainide  50 mg Oral BID  . insulin aspart  0-15 Units Subcutaneous TID WC  . insulin aspart  0-5 Units Subcutaneous QHS  . insulin aspart  4 Units Subcutaneous TID WC  . insulin glargine  15 Units Subcutaneous Daily  . mouth rinse  15 mL Mouth Rinse q12n4p  . multivitamin with minerals  1 tablet Oral Daily  . sodium chloride flush  10-40 mL Intracatheter Q12H  . sodium chloride flush  5 mL Intracatheter Q8H   Continuous Infusions: . sodium chloride Stopped (10/09/19 0959)  . cefTRIAXone (ROCEPHIN)  IV 2 g (10/18/19 0939)   PRN Meds: sodium chloride, acetaminophen, dextrose, oxyCODONE, sodium chloride flush   Vital Signs    Vitals:   10/17/19 0400 10/17/19 1603 10/17/19 2126 10/18/19 0413  BP: 114/73 (!) 106/59 (!) 110/59 (!) 117/57  Pulse: 73 89 73 69  Resp:  17 20 16   Temp: 98.6 F (37 C) 98.6 F (37 C) 99.1 F (37.3 C) 98.2 F (36.8 C)  TempSrc: Oral Oral Oral Oral  SpO2: 96% 97% 100% 96%  Weight:      Height:        Intake/Output Summary (Last 24 hours) at 10/18/2019 1058 Last data filed at 10/18/2019 0800 Gross per 24 hour  Intake 830 ml  Output 10 ml  Net 820 ml   Last 3 Weights 10/12/2019 10/08/2019 10/07/2019  Weight (lbs) 105 lb 2.6 oz 105 lb 2.6 oz 99 lb 10.4 oz  Weight (kg) 47.7 kg 47.7 kg 45.2 kg      Telemetry    Telemetry disconnected.   - Personally Reviewed  ECG    n/a - Personally Reviewed  Physical Exam   VS:  BP (!) 117/57 (BP Location: Left Arm)   Pulse 69    Temp 98.2 F (36.8 C) (Oral)   Resp 16   Ht 4\' 10"  (1.473 m)   Wt 47.7 kg   SpO2 96%   BMI 21.98 kg/m  , BMI Body mass index is 21.98 kg/m. GENERAL:  Well appearing.  No acute distress.  HEENT: Pupils equal round and reactive, fundi not visualized, oral mucosa unremarkable NECK:  No jugular venous distention, waveform within normal limits, carotid upstroke brisk and symmetric, no bruits LUNGS:  Clear to auscultation bilaterally HEART:  RRR.  PMI not displaced or sustained,S1 and S2 within normal limits, no S3, no S4, no clicks, no rubs, no murmurs ABD:  Flat, positive bowel sounds normal in frequency in pitch, no bruits, no rebound, no guarding, no midline pulsatile mass, no hepatomegaly, no splenomegaly EXT:  2 plus pulses throughout, no edema, no cyanosis no clubbing.  R UE PICC line SKIN:  No rashes no nodules NEURO:  Cranial nerves II through XII grossly intact, motor grossly intact throughout PSYCH:  Cognitively intact, oriented to person place and time   Labs    High Sensitivity Troponin:  No results for input(s): TROPONINIHS in the last 720 hours.  Chemistry Recent Labs  Lab 10/15/19 0500 10/16/19 0239 10/17/19 0433 10/18/19 0422  NA 135 135 134* 135  K 3.2* 3.7 3.5 3.6  CL 104 101 104 104  CO2 25 24 23 24   GLUCOSE 138* 202* 127* 115*  BUN 6* 6* 6* 7*  CREATININE 0.49 0.71 0.47 0.46  CALCIUM 7.5* 7.6* 7.8* 7.7*  PROT 5.5* 5.8*  --  6.1*  ALBUMIN 1.8* 1.7*  --  1.7*  AST 56* 49*  --  31  ALT 151* 114*  --  68*  ALKPHOS 102 110  --  104  BILITOT 0.6 0.6  --  0.5  GFRNONAA >60 >60 >60 >60  GFRAA >60 >60 >60 >60  ANIONGAP 6 10 7 7      Hematology Recent Labs  Lab 10/16/19 0239 10/17/19 0433 10/18/19 0422  WBC 21.7* 19.7* 17.8*  RBC 2.84* 2.98* 2.80*  HGB 8.7* 9.1* 8.5*  HCT 26.7* 28.1* 26.0*  MCV 94.0 94.3 92.9  MCH 30.6 30.5 30.4  MCHC 32.6 32.4 32.7  RDW 13.4 13.5 13.5  PLT 342 383 389    BNPNo results for input(s): BNP, PROBNP in the last 168  hours.   DDimer No results for input(s): DDIMER in the last 168 hours.   Radiology    No results found.  Cardiac Studies   TTE  09/05/17 Study Conclusions  - Left ventricle: The cavity size was normal. Wall thickness was normal. Systolic function was normal. The estimated ejection fraction was in the range of 60% to 65%. Wall motion was normal; there were no regional wall motion abnormalities. Features are consistent with a pseudonormal left ventricular filling pattern, with concomitant abnormal relaxation and increased filling pressure (grade 2 diastolic dysfunction). - Aortic valve: There was no stenosis. - Mitral valve: There was trivial regurgitation. - Left atrium: The atrium was mildly dilated. - Right ventricle: The cavity size was normal. Systolic function was normal. - Tricuspid valve: Peak RV-RA gradient (S): 26 mm Hg. - Pulmonary arteries: PA peak pressure: 29 mm Hg (S). - Inferior vena cava: The vessel was normal in size. The respirophasic diameter changes were in the normal range (>= 50%), consistent with normal central venous pressure.  Impressions:  - Normal LV size with EF 60-65%. Normal RV size and systolic function. No significant valvular abnormalities.   Patient Profile     73 y.o. female with PAF, diabetes, SSS s/p St. 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  No clear vegetation on PPM leads, though there was stranding.  No significant regurgitation or valve destruction.  She remains on IV antibiotics.  Plan to treat with IV antibiotics and no surgery/lead extraction at this time.  Appreciate ID following.  Plan for 6 weeks of antibiotics.   # Liver abscess:  S/p drainage by IR.   # PAF: She had afib with RVR on admission.  She  is on flecainide and has maintained sinus rhythm.  No anticoagulation 2/2 recent liver drain.  Her home metoprolol was held during admission due to hypotension.  Would consider resuming at clinic.  Continue Eliquis.  # Atherosclerotic plaque: Moderate plaque noted in the aorta.  Continue Eliquis without aspirin.  LDL was 77.  Would start low-dose statin as an outpatient once her LFTs completely normalized.  [facility] will sign off.  Medication Recommendations:  Resume metoprolol and start stain as an outpatient Other recommendations (labs, testing, etc):  none Follow up as an outpatient:  We have arranged.       For questions or updates, please contact [facility] Please consult [email or web address] for contact info under        Signed, Chilton Siiffany , MD  10/18/2019, 10:58 AM

## 2019-10-19 DIAGNOSIS — Z950 Presence of cardiac pacemaker: Secondary | ICD-10-CM | POA: Diagnosis not present

## 2019-10-19 DIAGNOSIS — I480 Paroxysmal atrial fibrillation: Secondary | ICD-10-CM | POA: Diagnosis not present

## 2019-10-19 DIAGNOSIS — Z7901 Long term (current) use of anticoagulants: Secondary | ICD-10-CM | POA: Diagnosis not present

## 2019-10-19 DIAGNOSIS — E1165 Type 2 diabetes mellitus with hyperglycemia: Secondary | ICD-10-CM | POA: Diagnosis not present

## 2019-10-19 DIAGNOSIS — K750 Abscess of liver: Secondary | ICD-10-CM | POA: Diagnosis not present

## 2019-10-19 DIAGNOSIS — I330 Acute and subacute infective endocarditis: Secondary | ICD-10-CM | POA: Diagnosis not present

## 2019-10-19 DIAGNOSIS — J150 Pneumonia due to Klebsiella pneumoniae: Secondary | ICD-10-CM | POA: Diagnosis not present

## 2019-10-19 DIAGNOSIS — I38 Endocarditis, valve unspecified: Secondary | ICD-10-CM | POA: Diagnosis not present

## 2019-10-19 DIAGNOSIS — B961 Klebsiella pneumoniae [K. pneumoniae] as the cause of diseases classified elsewhere: Secondary | ICD-10-CM | POA: Diagnosis not present

## 2019-10-19 DIAGNOSIS — Z794 Long term (current) use of insulin: Secondary | ICD-10-CM | POA: Diagnosis not present

## 2019-10-19 DIAGNOSIS — R7881 Bacteremia: Secondary | ICD-10-CM | POA: Diagnosis not present

## 2019-10-20 DIAGNOSIS — I38 Endocarditis, valve unspecified: Secondary | ICD-10-CM | POA: Diagnosis not present

## 2019-10-20 DIAGNOSIS — R7881 Bacteremia: Secondary | ICD-10-CM | POA: Diagnosis not present

## 2019-10-21 DIAGNOSIS — I38 Endocarditis, valve unspecified: Secondary | ICD-10-CM | POA: Diagnosis not present

## 2019-10-21 DIAGNOSIS — R7881 Bacteremia: Secondary | ICD-10-CM | POA: Diagnosis not present

## 2019-10-22 DIAGNOSIS — R7881 Bacteremia: Secondary | ICD-10-CM | POA: Diagnosis not present

## 2019-10-22 DIAGNOSIS — I38 Endocarditis, valve unspecified: Secondary | ICD-10-CM | POA: Diagnosis not present

## 2019-10-24 DIAGNOSIS — Z794 Long term (current) use of insulin: Secondary | ICD-10-CM | POA: Diagnosis not present

## 2019-10-24 DIAGNOSIS — Z7901 Long term (current) use of anticoagulants: Secondary | ICD-10-CM | POA: Diagnosis not present

## 2019-10-24 DIAGNOSIS — I38 Endocarditis, valve unspecified: Secondary | ICD-10-CM | POA: Diagnosis not present

## 2019-10-24 DIAGNOSIS — K750 Abscess of liver: Secondary | ICD-10-CM | POA: Diagnosis not present

## 2019-10-24 DIAGNOSIS — E1165 Type 2 diabetes mellitus with hyperglycemia: Secondary | ICD-10-CM | POA: Diagnosis not present

## 2019-10-24 DIAGNOSIS — B961 Klebsiella pneumoniae [K. pneumoniae] as the cause of diseases classified elsewhere: Secondary | ICD-10-CM | POA: Diagnosis not present

## 2019-10-24 DIAGNOSIS — J150 Pneumonia due to Klebsiella pneumoniae: Secondary | ICD-10-CM | POA: Diagnosis not present

## 2019-10-24 DIAGNOSIS — I480 Paroxysmal atrial fibrillation: Secondary | ICD-10-CM | POA: Diagnosis not present

## 2019-10-24 DIAGNOSIS — Z950 Presence of cardiac pacemaker: Secondary | ICD-10-CM | POA: Diagnosis not present

## 2019-10-24 DIAGNOSIS — R7881 Bacteremia: Secondary | ICD-10-CM | POA: Diagnosis not present

## 2019-10-24 DIAGNOSIS — I330 Acute and subacute infective endocarditis: Secondary | ICD-10-CM | POA: Diagnosis not present

## 2019-10-25 DIAGNOSIS — I38 Endocarditis, valve unspecified: Secondary | ICD-10-CM | POA: Diagnosis not present

## 2019-10-25 DIAGNOSIS — R7881 Bacteremia: Secondary | ICD-10-CM | POA: Diagnosis not present

## 2019-10-26 DIAGNOSIS — R7881 Bacteremia: Secondary | ICD-10-CM | POA: Diagnosis not present

## 2019-10-26 DIAGNOSIS — I38 Endocarditis, valve unspecified: Secondary | ICD-10-CM | POA: Diagnosis not present

## 2019-10-27 DIAGNOSIS — R7881 Bacteremia: Secondary | ICD-10-CM | POA: Diagnosis not present

## 2019-10-27 DIAGNOSIS — I38 Endocarditis, valve unspecified: Secondary | ICD-10-CM | POA: Diagnosis not present

## 2019-10-28 DIAGNOSIS — R7881 Bacteremia: Secondary | ICD-10-CM | POA: Diagnosis not present

## 2019-10-28 DIAGNOSIS — I38 Endocarditis, valve unspecified: Secondary | ICD-10-CM | POA: Diagnosis not present

## 2019-10-29 DIAGNOSIS — I339 Acute and subacute endocarditis, unspecified: Secondary | ICD-10-CM | POA: Diagnosis not present

## 2019-10-29 DIAGNOSIS — B961 Klebsiella pneumoniae [K. pneumoniae] as the cause of diseases classified elsewhere: Secondary | ICD-10-CM | POA: Diagnosis not present

## 2019-10-29 DIAGNOSIS — I330 Acute and subacute infective endocarditis: Secondary | ICD-10-CM | POA: Diagnosis not present

## 2019-10-29 DIAGNOSIS — Z7901 Long term (current) use of anticoagulants: Secondary | ICD-10-CM | POA: Diagnosis not present

## 2019-10-29 DIAGNOSIS — I38 Endocarditis, valve unspecified: Secondary | ICD-10-CM | POA: Diagnosis not present

## 2019-10-29 DIAGNOSIS — I480 Paroxysmal atrial fibrillation: Secondary | ICD-10-CM | POA: Diagnosis not present

## 2019-10-29 DIAGNOSIS — Z950 Presence of cardiac pacemaker: Secondary | ICD-10-CM | POA: Diagnosis not present

## 2019-10-29 DIAGNOSIS — K750 Abscess of liver: Secondary | ICD-10-CM | POA: Diagnosis not present

## 2019-10-29 DIAGNOSIS — J150 Pneumonia due to Klebsiella pneumoniae: Secondary | ICD-10-CM | POA: Diagnosis not present

## 2019-10-29 DIAGNOSIS — Z794 Long term (current) use of insulin: Secondary | ICD-10-CM | POA: Diagnosis not present

## 2019-10-29 DIAGNOSIS — R7881 Bacteremia: Secondary | ICD-10-CM | POA: Diagnosis not present

## 2019-10-29 DIAGNOSIS — E1165 Type 2 diabetes mellitus with hyperglycemia: Secondary | ICD-10-CM | POA: Diagnosis not present

## 2019-10-30 DIAGNOSIS — I38 Endocarditis, valve unspecified: Secondary | ICD-10-CM | POA: Diagnosis not present

## 2019-10-30 DIAGNOSIS — R7881 Bacteremia: Secondary | ICD-10-CM | POA: Diagnosis not present

## 2019-10-31 DIAGNOSIS — R7881 Bacteremia: Secondary | ICD-10-CM | POA: Diagnosis not present

## 2019-10-31 DIAGNOSIS — I38 Endocarditis, valve unspecified: Secondary | ICD-10-CM | POA: Diagnosis not present

## 2019-11-01 DIAGNOSIS — R7881 Bacteremia: Secondary | ICD-10-CM | POA: Diagnosis not present

## 2019-11-01 DIAGNOSIS — I38 Endocarditis, valve unspecified: Secondary | ICD-10-CM | POA: Diagnosis not present

## 2019-11-02 DIAGNOSIS — I38 Endocarditis, valve unspecified: Secondary | ICD-10-CM | POA: Diagnosis not present

## 2019-11-02 DIAGNOSIS — R7881 Bacteremia: Secondary | ICD-10-CM | POA: Diagnosis not present

## 2019-11-03 DIAGNOSIS — I38 Endocarditis, valve unspecified: Secondary | ICD-10-CM | POA: Diagnosis not present

## 2019-11-03 DIAGNOSIS — R7881 Bacteremia: Secondary | ICD-10-CM | POA: Diagnosis not present

## 2019-11-04 DIAGNOSIS — I38 Endocarditis, valve unspecified: Secondary | ICD-10-CM | POA: Diagnosis not present

## 2019-11-04 DIAGNOSIS — R7881 Bacteremia: Secondary | ICD-10-CM | POA: Diagnosis not present

## 2019-11-05 DIAGNOSIS — J150 Pneumonia due to Klebsiella pneumoniae: Secondary | ICD-10-CM | POA: Diagnosis not present

## 2019-11-05 DIAGNOSIS — E1165 Type 2 diabetes mellitus with hyperglycemia: Secondary | ICD-10-CM | POA: Diagnosis not present

## 2019-11-05 DIAGNOSIS — I480 Paroxysmal atrial fibrillation: Secondary | ICD-10-CM | POA: Diagnosis not present

## 2019-11-05 DIAGNOSIS — B961 Klebsiella pneumoniae [K. pneumoniae] as the cause of diseases classified elsewhere: Secondary | ICD-10-CM | POA: Diagnosis not present

## 2019-11-05 DIAGNOSIS — Z950 Presence of cardiac pacemaker: Secondary | ICD-10-CM | POA: Diagnosis not present

## 2019-11-05 DIAGNOSIS — K750 Abscess of liver: Secondary | ICD-10-CM | POA: Diagnosis not present

## 2019-11-05 DIAGNOSIS — I330 Acute and subacute infective endocarditis: Secondary | ICD-10-CM | POA: Diagnosis not present

## 2019-11-05 DIAGNOSIS — R7881 Bacteremia: Secondary | ICD-10-CM | POA: Diagnosis not present

## 2019-11-05 DIAGNOSIS — I38 Endocarditis, valve unspecified: Secondary | ICD-10-CM | POA: Diagnosis not present

## 2019-11-05 DIAGNOSIS — Z7901 Long term (current) use of anticoagulants: Secondary | ICD-10-CM | POA: Diagnosis not present

## 2019-11-05 DIAGNOSIS — Z794 Long term (current) use of insulin: Secondary | ICD-10-CM | POA: Diagnosis not present

## 2019-11-06 DIAGNOSIS — I38 Endocarditis, valve unspecified: Secondary | ICD-10-CM | POA: Diagnosis not present

## 2019-11-06 DIAGNOSIS — R7881 Bacteremia: Secondary | ICD-10-CM | POA: Diagnosis not present

## 2019-11-07 DIAGNOSIS — I38 Endocarditis, valve unspecified: Secondary | ICD-10-CM | POA: Diagnosis not present

## 2019-11-07 DIAGNOSIS — R7881 Bacteremia: Secondary | ICD-10-CM | POA: Diagnosis not present

## 2019-11-08 DIAGNOSIS — I38 Endocarditis, valve unspecified: Secondary | ICD-10-CM | POA: Diagnosis not present

## 2019-11-08 DIAGNOSIS — R7881 Bacteremia: Secondary | ICD-10-CM | POA: Diagnosis not present

## 2019-11-09 DIAGNOSIS — I38 Endocarditis, valve unspecified: Secondary | ICD-10-CM | POA: Diagnosis not present

## 2019-11-09 DIAGNOSIS — R7881 Bacteremia: Secondary | ICD-10-CM | POA: Diagnosis not present

## 2019-11-10 DIAGNOSIS — I38 Endocarditis, valve unspecified: Secondary | ICD-10-CM | POA: Diagnosis not present

## 2019-11-10 DIAGNOSIS — R7881 Bacteremia: Secondary | ICD-10-CM | POA: Diagnosis not present

## 2019-11-11 DIAGNOSIS — R7881 Bacteremia: Secondary | ICD-10-CM | POA: Diagnosis not present

## 2019-11-11 DIAGNOSIS — I38 Endocarditis, valve unspecified: Secondary | ICD-10-CM | POA: Diagnosis not present

## 2019-11-11 NOTE — Progress Notes (Signed)
Subjective:    Patient ID: Courtney [name], female    DOB: [birth year], 73 y.o.   MRN: [id number]  73 y.o.F [place] who is here to establish for primary care.  The patient was recently hospitalized between 24 October and fifth November for complex illness.  She had diabetes type 2 which was pre-existing and paroxysmal atrial fibrillation with tachybradycardia syndrome with previous pacemaker placement.  She presented with fever and confusion and found to have diabetic ketoacidosis and subsequently had bacteremia identified with Klebsiella and associated multifocal pneumonia along with liver abscess and valvular endocarditis.  Below is the discharge summary  Admit date: 10/06/2019 Discharge date: 10/18/2019  Admitted From: Home Disposition: Home  Recommendations for Outpatient Follow-up:  1. Follow up with PCP in 1 week  2. Outpatient follow-up with ID, IR and cardiology 3. Follow-up with IR for drain care 4. Follow up in ED if symptoms worsen or new appear   Home Health: RN Equipment/Devices: None  Discharge Condition: Stable CODE STATUS: Full Diet recommendation: Heart healthy/carb modified  Brief/Interim Summary: 73 year old female with history of diabetes mellitus type 2, paroxysmal A. fib and tachycardia/bradycardia syndrome status post PPM presented with fever, confusion. On presentation, she was found to have DKA which was treated with insulin drip and subsequently changed to long-acting insulin. During the hospitalization, she was found to have sepsis from Klebsiella bacteremia resulting in multifocal pneumonia, liver abscess along with valvular endocarditis. She underwent ultrasound-guided paracentesis and ultrasound-guided liver abscess aspiration on 10/10/2019. She subsequently underwent ultrasound guided drainage of hepatic abscess and drain placement by IR on 10/15/2019. ID has recommended Rocephin till 11/26/2019. CT surgery recommended continued antibiotic treatment  and no valve repair at this time.  She will be discharged home to complete intravenous antibiotics via PICC line.  Outpatient follow-up with ID/IR/cardiology.   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 She subsequently underwent ultrasound guided drainage of hepatic abscess and drain placement by IR on 10/15/2019.  -ID has recommended Rocephin till 11/26/2019 for a total of 6 weeks of antibiotics. CT surgery recommended continued antibiotic treatment and no valve repair at this time. -EP recommended medical therapy and surveillance cultures after therapy completed rather than lead removal now. -Outpatient follow-up with ID -Patient has a PICC line in place. -WBC still elevated but improving to 17.8 today.   Outpatient follow-up. -Outpatient follow-up with GI to arrange for colonoscopy, given liver abscess -Sepsis has resolved -Discharge patient home today as patient has remained hemodynamically stable.  Acute metabolic encephalopathy -Most likely from sepsis. Resolved  Transaminitis -Due to liver abscess. Improving.  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 Continue Eliquis and flecainide. Metoprolol on hold; will restart at a lower dose of 12.5 mg twice a day.  DKA: Resolved Diabetes mellitus type 2, uncontrolled with hyperglycemia -A1c 16.4. Continue carb modified diet. Continue Lantus along with insulin aspart with meals.  Outpatient follow-up.  Anemia of chronic disease--hemoglobin  stable.  ESBL positive and urine culture: Probably a colonizer as per ID.  Note the patient is on Rocephin and will continue for a 6-week total course.  She has follow-up visit with infectious disease.  There is a PICC line  in the right arm in place.  Note she does have a home care nurse that assists her.  Note the patient does need testing supplies for her glucose meter.  She also was not able to achieve the insulin Lantus as it was not covered under her insurance.  She also is out of her pain medicine and this is been an issue and that she has significant pain in the liver from the liver abscess.  Also there is a drain still in place that is managed by interventional radiology.  The patient has upcoming appointments with gastroenterology cardiology and infectious disease.  Note she has lost weight and does not have much appetite.  The patient is here with her grand son who provides interpretive services  Wt Readings from Last 3 Encounters: 11/12/19 : 95 lb (43.1 kg) 10/12/19 : 105 lb 2.6 oz (47.7 kg) 09/13/18 : 97 lb (44 kg)  Note the patient does smoke 1 cigar daily  Past Medical History:  Diagnosis Date  . Allergy   . Atherosclerosis of aorta ([place]) 10/15/2019  . Diabetes mellitus type 2, diet-controlled ([place])   . PAF (paroxysmal atrial fibrillation) ([place]) 09/04/2017  . Sinus bradycardia 09/04/2017   HR 30s at times  . Tachy-brady syndrome ([place]) 09/04/2017     Family History  Problem Relation Age of Onset  . Diabetes Son      Social History   Socioeconomic History  . Marital status: Single    Spouse name: Not on file  . Number of children: Not on file  . Years of education: Not on file  . Highest education level: Not on file  Occupational History  . Not on file  Social Needs  . Financial resource strain: Not on file  . Food insecurity    Worry: Not on file    Inability: Not on file  . Transportation needs    Medical: Not on file    Non-medical: Not on file   Tobacco Use  . Smoking status: Current Every Day Smoker  . Smokeless tobacco: Never Used  Substance and Sexual Activity  . Alcohol use: No    Frequency: Never  . Drug use: No  . Sexual activity: Not Currently  Lifestyle  . Physical activity    Days per week: Not on file    Minutes per session: Not on file  . Stress: Not on file  Relationships  . Social Herbalist on phone: Not on file    Gets together: Not on file    Attends religious service: Not on file    Active member of club or organization: Not on file    Attends meetings of clubs or organizations: Not on file    Relationship status: Not on file  . Intimate partner violence    Fear of current or ex partner: Not on file    Emotionally abused: Not on file    Physically abused: Not on file    Forced sexual activity: Not on file  Other Topics Concern  . Not on file  Social History Narrative  . Not on file     No Known Allergies   Outpatient Medications Prior to Visit  Medication Sig Dispense Refill  . acetaminophen (TYLENOL) 325 MG tablet Take 1-2 tablets (325-650 mg total) by mouth every 6 (six) hours as needed for mild pain or moderate pain.    Marland Kitchen apixaban (ELIQUIS) 5 MG TABS tablet Take 1 tablet (5 mg total) by mouth 2 (two)  times daily. 180 tablet 1  . cefTRIAXone (ROCEPHIN) IVPB Inject 2 g into the vein daily. Indication:  Klebsiella endocarditis/ bacteremia Last Day of Therapy:  11/26/2019 Labs - Once weekly:  CBC/D and BMP, Labs - Every other week:  ESR and CRP 36 Units 0  . flecainide (TAMBOCOR) 50 MG tablet Take 1 tablet (50 mg total) by mouth 2 (two) times daily. 180 tablet 0  . glucose monitoring kit (FREESTYLE) monitoring kit Check cbgs bid as instructed by pcp. 1 each 11  . insulin aspart (NOVOLOG) 100 UNIT/ML injection Inject 4 Units into the skin 3 (three) times daily with meals. 10 mL 0  . insulin glargine (LANTUS) 100 UNIT/ML injection Inject 0.2 mLs (20 Units total) into the skin daily. 10  mL 0  . Insulin Syringe-Needle U-100 (INSULIN SYRINGE .3CC/31GX5/16") 31G X 5/16" 0.3 ML MISC Lantus 20units daily and Novolog 4 units TID with meals 100 each 0  . metoprolol tartrate (LOPRESSOR) 25 MG tablet Take 0.5 tablets (12.5 mg total) by mouth 2 (two) times daily. 30 tablet 0  . oxyCODONE (OXY IR/ROXICODONE) 5 MG immediate release tablet Take 1 tablet (5 mg total) by mouth every 6 (six) hours as needed for severe pain. 14 tablet 0  . polyethylene glycol (MIRALAX) 17 g packet Take 17 g by mouth daily as needed. (Patient not taking: Reported on 11/12/2019) 14 each 0  . senna-docusate (SENOKOT S) 8.6-50 MG tablet Take 1 tablet by mouth 2 (two) times daily. (Patient not taking: Reported on 11/12/2019) 20 tablet 0   No facility-administered medications prior to visit.       Review of Systems Constitutional:     weight loss, night sweats,  Fevers, chills, fatigue, lassitude. HEENT:   No headaches,  Difficulty swallowing,  Tooth/dental problems,  Sore throat,                No sneezing, itching, ear ache, nasal congestion, post nasal drip,   CV:  No chest pain,  Orthopnea, PND, swelling in lower extremities, anasarca, dizziness, palpitations  GI   heartburn, indigestion, abdominal pain, nausea, vomiting, diarrhea, change in bowel habits, loss of appetite  Resp: No shortness of breath with exertion or at rest.  No excess mucus, no productive cough,  No non-productive cough,  No coughing up of blood.  No change in color of mucus.  No wheezing.  No chest wall deformity  Skin: no rash or lesions.  GU: no dysuria, change in color of urine, no urgency or frequency.  No flank pain.  MS:  No joint pain or swelling.  No decreased range of motion.  No back pain.  Psych:  No change in mood or affect. No depression or anxiety.  No memory loss.     Objective:   Physical Exam  Vitals:   11/12/19 1118  BP: 101/62  Pulse: 67  Resp: 18  Temp: 98.5 F (36.9 C)  TempSrc: Oral  SpO2: 98%   Weight: 95 lb (43.1 kg)  Height: '4\' 11"'$  (1.499 m)    Gen: Pleasant, thin in no distress,  normal affect  ENT: No lesions,  mouth clear,  oropharynx clear, no postnasal drip  Neck: No JVD, no TMG, no carotid bruits  Lungs: No use of accessory muscles, no dullness to percussion, clear without rales or rhonchi  Cardiovascular: RRR, heart sounds normal, no murmur or gallops, no peripheral edema, there is a PICC line in the right upper arm site is clean and dressed properly  Abdomen:  soft  no HSM,  BS normal, there is a right upper quadrant drain going into a Jackson-Pratt device with cloudy material seen in the drain, right upper quadrant is quite tender to palpation without rebound  Musculoskeletal: No deformities, no cyanosis or clubbing  Neuro: alert, non focal  Skin: Warm, no lesions or rashes  All labs and imaging study from recent hospitalization are reviewed in the [facility] system 11/1: CT ABD IMPRESSION: 1. Interval re-accumulation of an irregular rim enhancing fluid collection in the right lobe of the liver, compatible with an abscess measuring 3.5 x 3.3 x 3.0 cm. 2. Additional smaller probable abscess in the right lobe of the liver, as described above. 3. Changes compatible with parenchymal infection in the right lobe of the liver with linear extension medially. 4. Moderate-sized bilateral pleural effusions, increased. 5. Progressive compressive atelectasis in both lower lobes. 6. Bilateral subcutaneous edema.   10/26 cxr IMPRESSION: Slight interval worsening bilateral patchy airspace process likely multifocal infection.  BMP Latest Ref Rng & Units 10/18/2019 10/17/2019 10/16/2019  Glucose 70 - 99 mg/dL 115(H) 127(H) 202(H)  BUN 8 - 23 mg/dL 7(L) 6(L) 6(L)  Creatinine 0.44 - 1.00 mg/dL 0.46 0.47 0.71  BUN/Creat Ratio 12 - 28 - - -  Sodium 135 - 145 mmol/L 135 134(L) 135  Potassium 3.5 - 5.1 mmol/L 3.6 3.5 3.7  Chloride 98 - 111 mmol/L 104 104 101  CO2 22 - 32  mmol/L '24 23 24  '$ Calcium 8.9 - 10.3 mg/dL 7.7(L) 7.8(L) 7.6(L)   Hepatic Function Latest Ref Rng & Units 10/18/2019 10/16/2019 10/15/2019  Total Protein 6.5 - 8.1 g/dL 6.1(L) 5.8(L) 5.5(L)  Albumin 3.5 - 5.0 g/dL 1.7(L) 1.7(L) 1.8(L)  AST 15 - 41 U/L 31 49(H) 56(H)  ALT 0 - 44 U/L 68(H) 114(H) 151(H)  Alk Phosphatase 38 - 126 U/L 104 110 102  Total Bilirubin 0.3 - 1.2 mg/dL 0.5 0.6 0.6  Bilirubin, Direct 0.0 - 0.2 mg/dL - - -   CBC Latest Ref Rng & Units 10/18/2019 10/17/2019 10/16/2019  WBC 4.0 - 10.5 K/uL 17.8(H) 19.7(H) 21.7(H)  Hemoglobin 12.0 - 15.0 g/dL 8.5(L) 9.1(L) 8.7(L)  Hematocrit 36.0 - 46.0 % 26.0(L) 28.1(L) 26.7(L)  Platelets 150 - 400 K/uL 389 383 342      Assessment & Plan:  I personally reviewed all images and lab data in the [facility] system as well as any outside material available during this office visit and agree with the  radiology impressions.   Endocarditis Klebsiella bacteremia with associated endocarditis with findings on transesophageal echo  Continue intravenous Rocephin length of therapy per infectious disease according to orders it is to continue through December 17  Tachy-brady syndrome [facility]) Tachybradycardia syndrome controlled with flecainide and has permanent pacemaker  We will refill the flecainide as she is out of this medication  Liver abscess Liver abscess that did reaccumulate after paracentesis therefore now has a drain in place per interventional radiology  Follow-up per gastroenterology and infectious disease  Uncontrolled type 2 diabetes mellitus ([place]) Uncontrolled type 2 diabetes now requiring insulin therapy  Plan will be to obtain for the patient Basaglar insulin at a dose of 20 units daily which she has yet to receive and then to continue the NovoLog at 4 units 3 times daily with meals  Will obtain for the patient glucose testing supplies repeat labs today including CBC and complete metabolic panel urine for microalbumin  Note foot exam  was normal today  Bacteremia due to Klebsiella pneumoniae, hypervirulent  pathogen Bacteremia secondary to Klebsiella  Plan for this will be to continue antibiotics per infectious disease  Chronic anticoagulation On chronic Eliquis and have made refills available  Tobacco use disorder I counseled today with regards to tobacco use   Dorreen was seen today for hospitalization follow-up.  Diagnoses and all orders for this visit:  Bacteremia due to Klebsiella pneumoniae, hypervirulent pathogen -     DG Chest 2 View; Future -     CBC with Differential/Platelet; Future -     CBC with Differential/Platelet  Uncontrolled type 2 diabetes mellitus with hyperglycemia (HCC) -     Glucose (CBG) -     Comprehensive metabolic panel -     Microalbumin, urine  Acute bacterial endocarditis -     CBC with Differential/Platelet; Future -     CBC with Differential/Platelet  Chronic obstructive pulmonary disease, unspecified COPD type ([place])  Liver abscess -     CBC with Differential/Platelet; Future -     Comprehensive metabolic panel -     CBC with Differential/Platelet  Klebsiella infection -     DG Chest 2 View; Future  Chronic anticoagulation -     CBC with Differential/Platelet; Future -     CBC with Differential/Platelet  Pacemaker  Tobacco use disorder  Tachy-brady syndrome (HCC)  Other orders -     flecainide (TAMBOCOR) 50 MG tablet; Take 1 tablet (50 mg total) by mouth 2 (two) times daily. -     glucose monitoring kit (FREESTYLE) monitoring kit; Check cbgs bid as instructed by pcp. -     insulin aspart (NOVOLOG) 100 UNIT/ML injection; Inject 4 Units into the skin 3 (three) times daily with meals. -     Insulin Syringe-Needle U-100 (INSULIN SYRINGE .3CC/31GX5/16") 31G X 5/16" 0.3 ML MISC; Use with insulin glargine and novolog insulin -     metoprolol tartrate (LOPRESSOR) 25 MG tablet; Take 0.5 tablets (12.5 mg total) by mouth 2 (two) times daily. -     oxyCODONE (OXY  IR/ROXICODONE) 5 MG immediate release tablet; Take 1 tablet (5 mg total) by mouth every 6 (six) hours as needed for severe pain. -     Insulin Glargine (BASAGLAR KWIKPEN) 100 UNIT/ML SOPN; Inject 0.2 mLs (20 Units total) into the skin daily. -     Lancets (FREESTYLE) lancets; Use as instructed -     glucose blood (FREESTYLE TEST STRIPS) test strip; Use as instructed

## 2019-11-12 ENCOUNTER — Other Ambulatory Visit: Payer: Self-pay

## 2019-11-12 ENCOUNTER — Encounter: Payer: Self-pay | Admitting: Critical Care Medicine

## 2019-11-12 ENCOUNTER — Ambulatory Visit: Payer: Medicare HMO | Attending: Critical Care Medicine | Admitting: Critical Care Medicine

## 2019-11-12 VITALS — BP 101/62 | HR 67 | Temp 98.5°F | Resp 18 | Ht 59.0 in | Wt 95.0 lb

## 2019-11-12 DIAGNOSIS — B961 Klebsiella pneumoniae [K. pneumoniae] as the cause of diseases classified elsewhere: Secondary | ICD-10-CM | POA: Diagnosis not present

## 2019-11-12 DIAGNOSIS — E1165 Type 2 diabetes mellitus with hyperglycemia: Secondary | ICD-10-CM

## 2019-11-12 DIAGNOSIS — Z950 Presence of cardiac pacemaker: Secondary | ICD-10-CM | POA: Diagnosis not present

## 2019-11-12 DIAGNOSIS — Z7901 Long term (current) use of anticoagulants: Secondary | ICD-10-CM

## 2019-11-12 DIAGNOSIS — A498 Other bacterial infections of unspecified site: Secondary | ICD-10-CM

## 2019-11-12 DIAGNOSIS — J449 Chronic obstructive pulmonary disease, unspecified: Secondary | ICD-10-CM

## 2019-11-12 DIAGNOSIS — R7881 Bacteremia: Secondary | ICD-10-CM

## 2019-11-12 DIAGNOSIS — R69 Illness, unspecified: Secondary | ICD-10-CM | POA: Diagnosis not present

## 2019-11-12 DIAGNOSIS — I38 Endocarditis, valve unspecified: Secondary | ICD-10-CM | POA: Diagnosis not present

## 2019-11-12 DIAGNOSIS — J150 Pneumonia due to Klebsiella pneumoniae: Secondary | ICD-10-CM | POA: Diagnosis not present

## 2019-11-12 DIAGNOSIS — I330 Acute and subacute infective endocarditis: Secondary | ICD-10-CM | POA: Diagnosis not present

## 2019-11-12 DIAGNOSIS — F17200 Nicotine dependence, unspecified, uncomplicated: Secondary | ICD-10-CM

## 2019-11-12 DIAGNOSIS — Z794 Long term (current) use of insulin: Secondary | ICD-10-CM | POA: Diagnosis not present

## 2019-11-12 DIAGNOSIS — I495 Sick sinus syndrome: Secondary | ICD-10-CM

## 2019-11-12 DIAGNOSIS — K750 Abscess of liver: Secondary | ICD-10-CM

## 2019-11-12 DIAGNOSIS — I480 Paroxysmal atrial fibrillation: Secondary | ICD-10-CM | POA: Diagnosis not present

## 2019-11-12 LAB — GLUCOSE, POCT (MANUAL RESULT ENTRY): POC Glucose: 262 mg/dl — AB (ref 70–99)

## 2019-11-12 MED ORDER — MEGESTROL ACETATE 20 MG PO TABS: 20.0000 mg | ORAL_TABLET | Freq: Every day | ORAL | 2 refills | Status: DC

## 2019-11-12 MED ORDER — FREESTYLE SYSTEM KIT: PACK | 11 refills | Status: DC

## 2019-11-12 MED ORDER — OXYCODONE HCL 5 MG PO TABS: 5.0000 mg | ORAL_TABLET | Freq: Four times a day (QID) | ORAL | 0 refills | Status: DC | PRN

## 2019-11-12 MED ORDER — BASAGLAR KWIKPEN 100 UNIT/ML ~~LOC~~ SOPN: 20.0000 [IU] | PEN_INJECTOR | Freq: Every day | SUBCUTANEOUS | 4 refills | Status: DC

## 2019-11-12 MED ORDER — FREESTYLE LANCETS MISC: 12 refills | Status: DC

## 2019-11-12 MED ORDER — FREESTYLE TEST VI STRP: ORAL_STRIP | 12 refills | Status: DC

## 2019-11-12 MED ORDER — "INSULIN SYRINGE 31G X 5/16"" 0.3 ML MISC": 2 refills | Status: DC

## 2019-11-12 MED ORDER — METOPROLOL TARTRATE 25 MG PO TABS: 12.5000 mg | ORAL_TABLET | Freq: Two times a day (BID) | ORAL | 1 refills | Status: DC

## 2019-11-12 MED ORDER — INSULIN ASPART 100 UNIT/ML ~~LOC~~ SOLN: 4.0000 [IU] | Freq: Three times a day (TID) | SUBCUTANEOUS | 0 refills | Status: DC

## 2019-11-12 MED ORDER — FLECAINIDE ACETATE 50 MG PO TABS: 50.0000 mg | ORAL_TABLET | Freq: Two times a day (BID) | ORAL | 0 refills | Status: DC

## 2019-11-12 NOTE — Assessment & Plan Note (Signed)
Liver abscess that did reaccumulate after paracentesis therefore now has a drain in place per interventional radiology  Follow-up per gastroenterology and infectious disease

## 2019-11-12 NOTE — Patient Instructions (Addendum)
Refills on all your medications were sent to the [facility] pharmacy  Refill on the oxycodone for pain control was sent to the pharmacy  We prescribed insulin glargine in place of Lantus to take 20 units daily in addition to your current dose of NovoLog  Glucose testing kit and supplies were sent to your pharmacy  Keep your upcoming appointments with gastroenterology, infectious disease and please call Dr. Klein's office for a follow-up cardiology appointment  Please focus on smoking cessation  Please begin Glucerna nutritional supplements you can take this 2-3 times a day to help with your protein supplementation  Blood work today is ordered along with a urine for microalbumin  Chest x-ray was ordered   Return to see Dr. Wright in 1 month   Thng tin v? ht thu?c l, Ng??i l?n Smoking Tobacco Information, Adult Ht thu?c l c th? gy h?i cho s?c kh?e c?a qu v?. Thu?c l c m?t ch?t ha h?c khng mu, ??c h?i (??c), tn l nicotine. Nicotine l ch?t gy nghi?n. N lm thay ??i no v c th? khi?n khi?n cho kh c th? b? ht thu?c. Thu?c l c?ng c cc ch?t ha h?c ??c khc c th? gy th??ng t?n cho c? th? v gia t?ng nguy c? b? nhi?u b?nh ung th?. Thu?c l c th? ?nh h??ng ??n ti nh? th? no? Ht thu?c l khi?n qu v? c nguy c? b?:  Ung th?. Ht thu?c l th??ng ?i km nhi?u nh?t v?i ung th? ph?i, nh?ng c?ng c th? d?n ??n ung th? cc b? ph?n khc c?a c? th?.  B?nh ph?i t?c ngh?n m?n tnh (COPD). ?y l tnh tr?ng ko di ? ph?i gy kh th?. B?nh c?ng tr? nn tr?m tr?ng h?n theo th?i gian.  Huy?t p cao (t?ng huy?t p), b?nh tim, ??t qu?, ho?c nh?i mu c? tim.  Nhi?m trng ph?i, ch?ng h?n nh? vim ph?i.  ??c th?y tinh th?. ?y l tnh tr?ng khi th?y tinh th? trong m?t b? m? ??c.  Cc v?n ?? tiu ha. ?i?u ny c th? bao g?m lot d? dy, ? nng v b?nh tro ng??c d? dy th?c qu?n (GERD).  Cc v?n ?? s?c kh?e r?ng mi?ng, ch?ng h?n nh? b?nh ? n??u v r?ng r?ng.  M?t v? gic v kh?u  gic. Ht thu?c c th? ?nh h??ng ??n b? ngoi v gy ra:  V?t nh?n.  R?ng, ngn tay v mng tay vng ho?c x?n mu. Ht thu?c l c?ng c th? ?nh h??ng ??n ??i s?ng x h?i c?a qu v?, b?i v:  C th? kh tm ???c n?i ?? ht thu?c khi ra kh?i nh. Nhi?u n?i lm vi?c, nh hng, khch s?n v n?i cng c?ng khng c ht thu?c l.  Ht thu?c l tiu t?n ti?n b?c. ?i?u ny l v chi ph mua thu?c l v cc chi ph di h?n ?? ?i?u tr? cc v?n ?? s?c kh?e do ht thu?c.  Khi thu?c th? ??ng c th? ?nh h??ng ??n nh?ng ng??i xung quanh qu v?. Khi thu?c th? ??ng c th? gy ung th? ph?i, cc v?n ?? h h?p, v b?nh tim. Con c?a ng??i ht thu?c c nguy c? cao h?n b?: ? H?i ch?ng tr? s? sinh ??t t? (SIDS). ? Nhi?m trng tai. ? Nhi?m trng ph?i. N?u qu v? ?ang ht thu?c l, cai thu?c ngay by gi? c th? gip qu v?:  S?ng lu h?n v kh?e m?nh h?n.  Nhn, ng?i, ht th? v c?m gic   t?t h?n ln theo th?i gian.  Ti?t ki?m ti?n.  B?o v? nh?ng ng??i khc kh?i cc tc h?i c?a khi thu?c th? ??ng. Nh?ng hnh ??ng no ti c th? th??c hi?n ?? ng?n ng?a cc v?n ?? s?c kh?e? Cai thu?c l   ??ng b?t ??u ht thu?c. Cai thu?c n?u qu v? ? ht.  L?p m?t k? ho?ch cai thu?c v cam k?t th?c hi?n. Tm cc ch??ng trnh s? gip qu v? v xin l?i khuyn v  ki?n c?a chuyn gia ch?m [place] s?c kh?e.  ??t ra m?t ngy v ghi t?t c? cc l do qu v? mu?n cai thu?c.  Cho b?n b v gia ?nh bi?t qu v? ?ang cai thu?c ?? h? c th? tr? gip v ?ng h? qu v?. Cn nh?c tm nh?ng ng??i b?n c?ng mu?n cai thu?c. C th? d? cai thu?c h?n v?i m?t ng??i khc, ?? qu v? c th? h? tr? l?n nhau.  Trao ??i v?i chuyn gia ch?m [place] s?c kh?e v? vi?c s? d?ng thu?c thay th? nicotine ?? gip qu v? cai thu?c, ch?ng h?n nh? k?o nhai, thu?c d?ng k?o dn, mi?ng dn, thu?c x?t ho?c thu?c vin.  Khng thay th? thu?c l d?ng ht b?ng thu?c l ?i?n t?, th??ng ???c g?i l e-cigarette. V?n ch?a r ?? an ton c?a thu?c l ?i?n t? v m?t s? lo?i c th?  c cc ha ch?t c h?i.  N?u qu v? c? g?ng cai thu?c nh?ng ht tr? l?i, hy l?c quan. Th??ng qu v? hay b? th?t b?i khi cai thu?c l?n ??u tin, v v?y ??ng n?n lng khi ?i?u ? x?y ra.  Hy chu?n b? cho cc c?n thm thu?c. Khi qu v? c?m th?y mu?n ht thu?c, hy nhai k?o d?o ho?c mt k?o c?ng. L?i s?ng  Hy ?? mnh b?n r?n v ch?m [place] c? th? qu v?.  U?ng ?? n??c ?? gi? cho n??c ti?u c mu vng nh?t.  T?p luy?n th?t nhi?u v ?n m?t ch? ?? ?n co? l??i cho s??c kho?e. ?i?u ny c th? gip ng?n ng?a t?ng cn sau khi cai thu?c.  Theo di cc Joslynn quen ?n u?ng c?a qu v?. Cai thu?c c th? khi?n qu v? thm ?n nhi?u h?n khi qu v? ht thu?c.  Tm cch th? gin. ?i ra ngoi v?i b?n b ho?c gia ?nh ?? xem phim ho?c ??n nh hng n?i m?i ng??i khng ht thu?c.  Hy h?i chuyn gia ch?m [place] s?c kh?e v? vi?c ki?m tra ??nh k? (cc sng l?c) ?? ki?m tra b?nh ung th?. Nh?ng ki?m tra ny c th? bao g?m xt nghi?m mu, ki?m tra hnh ?nh v xt nghi?m khc.  Tm cch ?? qu?n l c?ng th?ng, ch?ng h?n nh? t?p [place]?n, yoga, ho?c t?p th? d?c. Tm ki?m tr? gip ? ?u ?? ???c h? tr? cai thu?c l, hy cn nh?c:  H?i chuyn gia ch?m [place] s?c kh?e ?? bi?t thm thng tin v ngu?n ti nguyn.  Tham gia cc l?p h?c ?? tm hi?u thm v? cai thu?c l.  Tm ki?m cc t? ch?c ? ??a ph??ng cung c?p ngu?n ti nguyn v? cai thu?c l.  Gia nh?p m?t nhm h? tr? cho nh?ng ng??i mu?n cai thu?c l ? c?ng ??ng ??a ph??ng c?a qu v?.  G?i cho ???ng dy tr? gip c?a t? v?n vin ylrcy.com: 1-800-Quit-Now (4-332-951-8841) N?i ?? tm thm thng tin Qu v? c th? tm thm thng tin v? cch cai thu?c l  t?:  HelpGuide.org: www.helpguide.org  https://hall.com/: smokefree.gov  American Lung Association (Hi?p h?i Ph?i Hoa k?): www.lung.org Hy lin l?c v?i chuyn gia ch?m [place] s?c kh?e n?u qu v?:  B? cc v?n ?? h h?p.  Nh?n th?y r?ng mi, m?i ho?c ngn tay qu v? chuy?n sang mu xanh.  B? ?au ng?c.  Ho ra mu.  C?m  th?y mu?n ng?t ho?c ng?t.  C cc thay ??i s?c kh?e khc khi?n b?n thn lo l?ng. Tm t?t  Ht thu?c l c th? ?nh h??ng tiu c?c ??n s?c kh?e c?a qu v?, s?c kh?e c?a nh?ng ng??i xung quanh, ti chnh v ??i s?ng x h?i c?a qu v?.  ??ng b?t ??u ht thu?c. Cai thu?c n?u qu v? ? ht. N?u qu v? c?n gip ?? ?? cai thu?c, hy h?i chuyn gia ch?m [place] s?c kh?e.  Suy ngh? v? vi?c gia nh?p m?t nhm h? tr? cho nh?ng ng??i mu?n cai thu?c l ? c?ng ??ng ??a ph??ng c?a qu v?. C nhi?u ch??ng trnh hi?u qu? s? gip qu v? b? hnh vi ny. Thng tin ny khng nh?m m?c ?ch thay th? cho l?i khuyn m chuyn gia ch?m [place] s?c kh?e ni v?i qu v?. Hy b?o ??m qu v? ph?i th?o lu?n b?t k? v?n ?? g m qu v? c v?i chuyn gia ch?m  s?c kh?e c?a qu v?. Document Released: 04/12/2017 Document Revised: 03/13/2018 Document Reviewed: 04/12/2017 Elsevier Patient Education  2020 [employer].

## 2019-11-12 NOTE — Assessment & Plan Note (Signed)
Bacteremia secondary to Klebsiella  Plan for this will be to continue antibiotics per infectious disease

## 2019-11-12 NOTE — Assessment & Plan Note (Signed)
Klebsiella bacteremia with associated endocarditis with findings on transesophageal echo  Continue intravenous Rocephin length of therapy per infectious disease according to orders it is to continue through December 17

## 2019-11-12 NOTE — Assessment & Plan Note (Signed)
On chronic Eliquis and have made refills available

## 2019-11-12 NOTE — Assessment & Plan Note (Signed)
I counseled today with regards to tobacco use

## 2019-11-12 NOTE — Assessment & Plan Note (Signed)
Tachybradycardia syndrome controlled with flecainide and has permanent pacemaker  We will refill the flecainide as she is out of this medication

## 2019-11-12 NOTE — Assessment & Plan Note (Signed)
Uncontrolled type 2 diabetes now requiring insulin therapy  Plan will be to obtain for the patient Basaglar insulin at a dose of 20 units daily which she has yet to receive and then to continue the NovoLog at 4 units 3 times daily with meals  Will obtain for the patient glucose testing supplies repeat labs today including CBC and complete metabolic panel urine for microalbumin  Note foot exam was normal today

## 2019-11-13 ENCOUNTER — Ambulatory Visit (INDEPENDENT_AMBULATORY_CARE_PROVIDER_SITE_OTHER): Payer: Medicare HMO | Admitting: Nurse Practitioner

## 2019-11-13 ENCOUNTER — Other Ambulatory Visit: Payer: Self-pay | Admitting: Cardiology

## 2019-11-13 ENCOUNTER — Encounter: Payer: Self-pay | Admitting: Nurse Practitioner

## 2019-11-13 VITALS — BP 102/58 | HR 62 | Temp 97.6°F | Ht <= 58 in | Wt 97.2 lb

## 2019-11-13 DIAGNOSIS — Z1211 Encounter for screening for malignant neoplasm of colon: Secondary | ICD-10-CM

## 2019-11-13 DIAGNOSIS — K750 Abscess of liver: Secondary | ICD-10-CM | POA: Diagnosis not present

## 2019-11-13 DIAGNOSIS — I38 Endocarditis, valve unspecified: Secondary | ICD-10-CM | POA: Diagnosis not present

## 2019-11-13 DIAGNOSIS — Z1231 Encounter for screening mammogram for malignant neoplasm of breast: Secondary | ICD-10-CM

## 2019-11-13 DIAGNOSIS — R7881 Bacteremia: Secondary | ICD-10-CM | POA: Diagnosis not present

## 2019-11-13 LAB — COMPREHENSIVE METABOLIC PANEL
ALT: 13 IU/L (ref 0–32)
AST: 18 IU/L (ref 0–40)
Albumin/Globulin Ratio: 1.1 — ABNORMAL LOW (ref 1.2–2.2)
Albumin: 3.9 g/dL (ref 3.7–4.7)
Alkaline Phosphatase: 133 IU/L — ABNORMAL HIGH (ref 39–117)
BUN/Creatinine Ratio: 26 (ref 12–28)
BUN: 20 mg/dL (ref 8–27)
Bilirubin Total: <0.2 mg/dL (ref 0.0–1.2)
CO2: 22 mmol/L (ref 20–29)
Calcium: 9.4 mg/dL (ref 8.7–10.3)
Chloride: 102 mmol/L (ref 96–106)
Creatinine, Ser: 0.76 mg/dL (ref 0.57–1.00)
GFR calc Af Amer: 91 mL/min/{1.73_m2} (ref >59)
GFR calc non Af Amer: 79 mL/min/{1.73_m2} (ref >59)
Globulin, Total: 3.4 g/dL (ref 1.5–4.5)
Glucose: 152 mg/dL — ABNORMAL HIGH (ref 65–99)
Potassium: 4.2 mmol/L (ref 3.5–5.2)
Sodium: 138 mmol/L (ref 134–144)
Total Protein: 7.3 g/dL (ref 6.0–8.5)

## 2019-11-13 LAB — CBC WITH DIFFERENTIAL/PLATELET
Basophils Absolute: 0.1 10*3/uL (ref 0.0–0.2)
Basos: 1 %
EOS (ABSOLUTE): 0.2 10*3/uL (ref 0.0–0.4)
Eos: 4 %
Hematocrit: 32.3 % — ABNORMAL LOW (ref 34.0–46.6)
Hemoglobin: 10.5 g/dL — ABNORMAL LOW (ref 11.1–15.9)
Immature Grans (Abs): 0 10*3/uL (ref 0.0–0.1)
Immature Granulocytes: 0 %
Lymphocytes Absolute: 1.8 10*3/uL (ref 0.7–3.1)
Lymphs: 30 %
MCH: 30.3 pg (ref 26.6–33.0)
MCHC: 32.5 g/dL (ref 31.5–35.7)
MCV: 93 fL (ref 79–97)
Monocytes Absolute: 1 10*3/uL — ABNORMAL HIGH (ref 0.1–0.9)
Monocytes: 16 %
Neutrophils Absolute: 3 10*3/uL (ref 1.4–7.0)
Neutrophils: 49 %
Platelets: 420 10*3/uL (ref 150–450)
RBC: 3.46 x10E6/uL — ABNORMAL LOW (ref 3.77–5.28)
RDW: 13.6 % (ref 11.7–15.4)
WBC: 6.1 10*3/uL (ref 3.4–10.8)

## 2019-11-13 NOTE — Patient Instructions (Addendum)
If you are age 72 or older, your body mass index should be between 23-30. Your Body mass index is 24.33 kg/m. If this is out of the aforementioned range listed, please consider follow up with your Primary Care Provider.  We would like to move forward with scheduling a colonoscopy for you around the end of 12/2019 beginning of 01/2020. We will require cardiac clearance from you cardiologist Dr Jens Som. Please schedule an appointment with him.  Follow up with infectious disease.  Contact our office after you cardiac clearance to schedule your colonoscopy.  Thank you for choosing me and [facility]

## 2019-11-13 NOTE — Progress Notes (Signed)
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 She was admitted to [facility] 10/06/2019 with altered mental status, fever and DKA. She was found to have sepsis from Klebsiella Pneumoniae bacteremia resulting in multifocal pneumonia, liver abscess and valvular endocarditis. She underwent an ultrasound-guided paracentesis and ultrasound-guided liver abscess aspiration on 10/10/2019. She subsequently underwent ultrasound guided drainage of hepatic abscess and drain placement by IR on 10/15/2019. ID  recommended Rocephin till 11/26/2019. CT surgery recommended continued antibiotic treatment without plans for valve repair. She was discharged home with a PICC line for continued IV antibiotics, percutaneous drain and to follow up with ID, IR, cardiology and to follow up in our office to schedule a colonoscopy.   She presents today accompanied by her grandson [name].  He is interpreting during her office visit today.  She denies having any nausea or vomiting.  She has stable right upper quadrant discomfort for which she takes oxycodone as needed.  Approximately 2 weeks ago she ran out of the oxycodone and her right upper quadrant pain worsened.  She reports having intermittent chest pain which sometimes radiates to her back.  No jaundice.  No cough or shortness of breath.  No fever, sweats or chills.  She empties her percutaneous drain every 4 to 5 days, the drain currently has approximately 20 cc of bilious type fluid.  Her appetite has decreased.  Her sense of taste has changed. She has  lost approximately 25-27 lbs since her hospitalization.  She previously weighed 123 pounds, today's weight 96 pounds.  She was seen by her PCP Dr. Joya Gaskins yesterday and her grandson stated she was prescribed an appetite stimulant,  perhaps Remeron.  She is scheduled to see infectious disease specialist Dr. Janene Madeira on 11/20/2019.  I asked her grandson to call me after the patient sees Dr. Doren Custard to verify if a repeat abdominal/pelvic CAT scan was ordered to reassess the liver abscess which should be done before her PICC line is removed.  Her most recent laboratory studies were done 11/12/2019: WBC 6.1 down from 17.8.  Hemoglobin 10.5 up from 8.5. Total bili <  0.2.  Alk phos 133.  AST 18.  ALT 13.  CBC Latest Ref Rng & Units 11/12/2019 10/18/2019 10/17/2019  WBC 3.4 - 10.8 x10E3/uL 6.1 17.8(H) 19.7(H)  Hemoglobin 11.1 - 15.9 g/dL 10.5(L) 8.5(L) 9.1(L)  Hematocrit 34.0 - 46.6 % 32.3(L) 26.0(L) 28.1(L)  Platelets 150 - 450 x10E3/uL 420 389 383    CMP Latest Ref Rng & Units 11/12/2019 10/18/2019 10/17/2019  Glucose 65 - 99 mg/dL 152(H) 115(H) 127(H)  BUN 8 - 27 mg/dL 20 7(L) 6(L)  Creatinine 0.57 - 1.00 mg/dL 0.76 0.46 0.47  Sodium 134 - 144 mmol/L 138 135 134(L)  Potassium 3.5 - 5.2 mmol/L 4.2 3.6 3.5  Chloride 96 - 106 mmol/L 102 104 104  CO2 20 - 29 mmol/L '22 24 23  '$ Calcium 8.7 - 10.3 mg/dL 9.4 7.7(L) 7.8(L)  Total Protein 6.0 - 8.5 g/dL 7.3 6.1(L) -  Total Bilirubin 0.0 - 1.2 mg/dL <0.2 0.5 -  Alkaline Phos 39 - 117 IU/L 133(H) 104 -  AST 0 - 40 IU/L 18 31 -  ALT 0 - 32 IU/L 13 68(H) -   Abdominal/pelvic CT 10/14/2019: 1. Interval re-accumulation of an irregular rim enhancing  fluid collection in the right lobe of the liver, compatible with an abscess measuring 3.5 x 3.3 x 3.0 cm. 2. Additional smaller probable abscess in the right lobe of the liver, as described above. 3. Changes compatible with parenchymal infection in the right lobe of the liver with linear extension medially. 4. Moderate-sized bilateral pleural effusions, increased. 5. Progressive compressive atelectasis in both lower lobes. 6. Bilateral subcutaneous edema.  Abdominal/Pelvic CT 10/07/2019: 1. Small bilateral pleural effusions and  findings of multifocal pneumonia. Clinical correlation is recommended. 2. Diffuse gallbladder wall edema and pericholecystic fluid. Additionally there is periportal edema and findings concerning for developing intrahepatic abscesses. Findings may represent cholangitis. Clinical correlation and further evaluation with MRCP is recommended. 3. Underdistention of the colon versus less likely mild colitis. Clinical correlation is recommended. No bowel obstruction. Normal appendix. Aortic Atherosclerosis   Paracentesis 10/10/2019: 1. Ultrasound-guided aspiration of the intrahepatic abscess/abscesses. Total of 10 mL of purulent fluid was removed. There was not a well-defined collection within the liver based on wire placement. In addition, the liver abscess appeared to be completely decompressed by the end of the procedure. Therefore, hepatic drain was not placed. In addition, there is concern about placing an intrahepatic drain in the setting of ascites. Abscess fluid was sent for culture. 2. Ultrasound-guided paracentesis. Approximately 220 mL of peritoneal fluid was removed. Fluid was sent for analysis. Cytology negative for malignant cells.    ECHO TEE 10/12/2019: 1. Left ventricular ejection fraction, by visual estimation, is 55 to 60%. The left ventricle has normal function. There is no left ventricular hypertrophy. 2. Global right ventricle has normal systolic function.The right ventricular size is normal. No increase in right ventricular wall thickness. 3. No vegetations on pacer wires. 4. Left atrial size was normal. 5. Right atrial size was normal. 6. Mild mitral valve prolapse. 7. The mitral valve is abnormal. Mild mitral valve regurgitation. No evidence of mitral stenosis. 8. No trisuspid valve vegetation visualized. 9. The tricuspid valve is normal in structure. Tricuspid valve regurgitation is mild. 10. The aortic valve is tricuspid. Aortic valve regurgitation is not  visualized. 11. No pulmonic vegetation present. 12. The pulmonic valve was grossly normal. Pulmonic valve regurgitation is not visualized. 13. Moderate plaque invoving the descending aorta. 14. A pacer wire is visualized in the RV and RA. 15. In the setting of current bacteremia and implanted pacemaker, there do appear to be small filamentous strand-like material attached to the atrial surface of the mitral valve and just proximal to the aortic valve in the LVOT which could possibly represent vegetative material. This does not have the clinical appearance of the typical thick or shaggy vegetation. Nonetheless, consider treatment for possible endocarditis  Past Medical History:  Diagnosis Date  . Allergy   . Atherosclerosis of aorta ([place]) 10/15/2019  . Diabetes mellitus type 2, diet-controlled ([place])   . PAF (paroxysmal atrial fibrillation) ([place]) 09/04/2017  . Sinus bradycardia 09/04/2017   HR 30s at times  . Tachy-brady syndrome ([place]) 09/04/2017   Past Surgical History:  Procedure Laterality Date  . IR GUIDED DRAIN W CATHETER PLACEMENT  10/15/2019  . IR PARACENTESIS  10/10/2019  . IR US GUIDE BX ASP/DRAIN  10/10/2019  . PACEMAKER IMPLANT N/A 09/05/2017   Procedure: Pacemaker Implant;  Surgeon: Deboraha Sprang, MD;  Location: [facility];  Service: Cardiovascular;  Laterality: N/A;  . TEE WITHOUT CARDIOVERSION N/A 10/12/2019   Procedure: TRANSESOPHAGEAL ECHOCARDIOGRAM (TEE);  Surgeon: Jerline Pain, MD;  Location: [facility] ENDOSCOPY;  Service:  Cardiovascular;  Laterality: N/A;     Current Medications, Allergies, Past Medical History, Past Surgical History, Family History and Social History were reviewed in [employer] record.   Physical Exam: BP (!) 102/58   Pulse 62   Temp 97.6 F (36.4 C)   Ht '4\' 5"'$  (1.346 m) Comment: W/O SHOES  Wt 97 lb 3.2 oz (44.1 kg)   BMI 24.33 kg/m  General:  73 year old [place] female petite alert in no acute distress  Head: Normocephalic and atraumatic Eyes:  Sclerae anicteric, conjunctiva pink  Ears: Normal auditory acuity Mouth: No ulcers or lesions  Neck: Supple, no lymphadenopathy Lungs: Clear throughout to auscultation Heart: Regular rate and rhythm, no murmurs Abdomen: Soft, non tender and non distended. No masses, no hepatomegaly. Normal bowel sounds x4 quadrants.  Right upper quadrant percutaneous drain site dressing intact, drain with a small amount of bilious fluid. Rectal: Deferred. Musculoskeletal: Symmetrical with no gross deformities  Extremities: No edema  Neurological: Alert oriented x 4, grossly nonfocal Psychological:  Alert and cooperative. Normal mood and affect  Assessment and Recommendations:  17.  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  Last dose of Rocephin IV is due 11/26/2019.  LFTs are normal.  Elevated alk phos 133.  Patient cannot have MRCP due to having a pacemaker. -Patient to proceed with her appointment with infectious disease specialist Dr. Janene Madeira on 11/20/2019 as scheduled -A repeat abdominal/pelvic CAT scan should be completed prior to removing her PICC line and percutaneous intrahepatic drain -Repeat CBC and hepatic panel in 2 weeks  2. Leukocytosis, resolved  3. Colon cancer screening -Colonoscopy benefits and risk discussed including risk with sedation, risk of bleeding, perforation infection -Colonoscopy to be scheduled later January or early February 2022, after repeat abdominal/pelvic CT scan completed and removal of her PICC line and percutaneous drain -Cardiac clearance requested prior to the patient proceeding with a colonoscopy as the patient was due to see her cardiologist following her hospital discharge.  Patient has intermittent chest pain which may be biliary in origin but cardiac etiology to be determined by her  cardiologist Dr. Caryl Comes.  She requires cardiology follow-up for her mitral and aortic valve vegetation as well.  Dr. Caryl Comes to provide Eliquis instructions prior to colonoscopy.  4. Atrial fibrillation on Eliquis on Eliquis  5. Anemia, improving.  Hemoglobin 10.5.  Hematocrit 32.3.  MCV 93.  No signs of active GI bleeding.  6. DM II  7.  Weight loss -Diabetic Ensure 1 to 2 cans daily -Continue follow-up with your PCP regarding weight loss management

## 2019-11-14 DIAGNOSIS — I38 Endocarditis, valve unspecified: Secondary | ICD-10-CM | POA: Diagnosis not present

## 2019-11-14 DIAGNOSIS — R7881 Bacteremia: Secondary | ICD-10-CM | POA: Diagnosis not present

## 2019-11-14 NOTE — Progress Notes (Signed)
Attending Physician's Attestation   I have reviewed the chart.   I agree with the Advanced Practitioner's note, impression, and recommendations with any updates as below.  Agree that follow up imaging is critical prior to drain removal in case anything else will be required.  Still an odd presentation for her overall clinical picture but in setting of disseminated Klebsiella it seems possible.  MRI/MRCP would be most optimal, but may need to consider EUS at some point in future to rule out microlithiasis and biliary etiology though imaging up to this point has not suggested any evidence of biliary dilation to be concerned about.  I think follow up in clinic should be done first before her procedure/procedures are scheduled, so should plan a follow up in 23-months.  Justice Britain, MD [facility] Advanced Endoscopy Office # [phone]

## 2019-11-15 ENCOUNTER — Telehealth (INDEPENDENT_AMBULATORY_CARE_PROVIDER_SITE_OTHER): Payer: Self-pay | Admitting: Nurse Practitioner

## 2019-11-15 DIAGNOSIS — R7881 Bacteremia: Secondary | ICD-10-CM | POA: Diagnosis not present

## 2019-11-15 DIAGNOSIS — I38 Endocarditis, valve unspecified: Secondary | ICD-10-CM | POA: Diagnosis not present

## 2019-11-15 NOTE — Telephone Encounter (Signed)
Will call and get pt scheduled for appointment once Feb 2021 schedule is available.

## 2019-11-15 NOTE — Telephone Encounter (Signed)
Rovanda, can you contact the patient's son or grandson (patient does not speak [place]) and schedule her for an office visit with Dr. Rush Landmark in 2 months, this appointment should be done before she is scheduled for a colonoscopy. Refer to pt's last office visit with  Dr. Donneta Romberg addendum. thx.

## 2019-11-16 DIAGNOSIS — I38 Endocarditis, valve unspecified: Secondary | ICD-10-CM | POA: Diagnosis not present

## 2019-11-16 DIAGNOSIS — R7881 Bacteremia: Secondary | ICD-10-CM | POA: Diagnosis not present

## 2019-11-17 DIAGNOSIS — I38 Endocarditis, valve unspecified: Secondary | ICD-10-CM | POA: Diagnosis not present

## 2019-11-17 DIAGNOSIS — R7881 Bacteremia: Secondary | ICD-10-CM | POA: Diagnosis not present

## 2019-11-18 DIAGNOSIS — E1165 Type 2 diabetes mellitus with hyperglycemia: Secondary | ICD-10-CM | POA: Diagnosis not present

## 2019-11-18 DIAGNOSIS — Z950 Presence of cardiac pacemaker: Secondary | ICD-10-CM | POA: Diagnosis not present

## 2019-11-18 DIAGNOSIS — K750 Abscess of liver: Secondary | ICD-10-CM | POA: Diagnosis not present

## 2019-11-18 DIAGNOSIS — J150 Pneumonia due to Klebsiella pneumoniae: Secondary | ICD-10-CM | POA: Diagnosis not present

## 2019-11-18 DIAGNOSIS — Z7901 Long term (current) use of anticoagulants: Secondary | ICD-10-CM | POA: Diagnosis not present

## 2019-11-18 DIAGNOSIS — I330 Acute and subacute infective endocarditis: Secondary | ICD-10-CM | POA: Diagnosis not present

## 2019-11-18 DIAGNOSIS — I38 Endocarditis, valve unspecified: Secondary | ICD-10-CM | POA: Diagnosis not present

## 2019-11-18 DIAGNOSIS — B961 Klebsiella pneumoniae [K. pneumoniae] as the cause of diseases classified elsewhere: Secondary | ICD-10-CM | POA: Diagnosis not present

## 2019-11-18 DIAGNOSIS — I480 Paroxysmal atrial fibrillation: Secondary | ICD-10-CM | POA: Diagnosis not present

## 2019-11-18 DIAGNOSIS — R7881 Bacteremia: Secondary | ICD-10-CM | POA: Diagnosis not present

## 2019-11-18 DIAGNOSIS — Z794 Long term (current) use of insulin: Secondary | ICD-10-CM | POA: Diagnosis not present

## 2019-11-19 DIAGNOSIS — B961 Klebsiella pneumoniae [K. pneumoniae] as the cause of diseases classified elsewhere: Secondary | ICD-10-CM | POA: Diagnosis not present

## 2019-11-19 DIAGNOSIS — K750 Abscess of liver: Secondary | ICD-10-CM | POA: Diagnosis not present

## 2019-11-19 DIAGNOSIS — Z950 Presence of cardiac pacemaker: Secondary | ICD-10-CM | POA: Diagnosis not present

## 2019-11-19 DIAGNOSIS — I38 Endocarditis, valve unspecified: Secondary | ICD-10-CM | POA: Diagnosis not present

## 2019-11-19 DIAGNOSIS — Z794 Long term (current) use of insulin: Secondary | ICD-10-CM | POA: Diagnosis not present

## 2019-11-19 DIAGNOSIS — R7881 Bacteremia: Secondary | ICD-10-CM | POA: Diagnosis not present

## 2019-11-19 DIAGNOSIS — Z7901 Long term (current) use of anticoagulants: Secondary | ICD-10-CM | POA: Diagnosis not present

## 2019-11-19 DIAGNOSIS — I330 Acute and subacute infective endocarditis: Secondary | ICD-10-CM | POA: Diagnosis not present

## 2019-11-19 DIAGNOSIS — I480 Paroxysmal atrial fibrillation: Secondary | ICD-10-CM | POA: Diagnosis not present

## 2019-11-19 DIAGNOSIS — E1165 Type 2 diabetes mellitus with hyperglycemia: Secondary | ICD-10-CM | POA: Diagnosis not present

## 2019-11-19 DIAGNOSIS — J150 Pneumonia due to Klebsiella pneumoniae: Secondary | ICD-10-CM | POA: Diagnosis not present

## 2019-11-20 ENCOUNTER — Encounter: Payer: Self-pay | Admitting: Infectious Diseases

## 2019-11-20 ENCOUNTER — Other Ambulatory Visit: Payer: Self-pay

## 2019-11-20 ENCOUNTER — Ambulatory Visit (INDEPENDENT_AMBULATORY_CARE_PROVIDER_SITE_OTHER): Payer: Medicare HMO | Admitting: Infectious Diseases

## 2019-11-20 VITALS — BP 105/70 | HR 73 | Temp 98.0°F | Ht <= 58 in | Wt 96.0 lb

## 2019-11-20 DIAGNOSIS — B961 Klebsiella pneumoniae [K. pneumoniae] as the cause of diseases classified elsewhere: Secondary | ICD-10-CM

## 2019-11-20 DIAGNOSIS — R945 Abnormal results of liver function studies: Secondary | ICD-10-CM | POA: Diagnosis not present

## 2019-11-20 DIAGNOSIS — K750 Abscess of liver: Secondary | ICD-10-CM

## 2019-11-20 DIAGNOSIS — I38 Endocarditis, valve unspecified: Secondary | ICD-10-CM | POA: Diagnosis not present

## 2019-11-20 DIAGNOSIS — I330 Acute and subacute infective endocarditis: Secondary | ICD-10-CM

## 2019-11-20 DIAGNOSIS — R079 Chest pain, unspecified: Secondary | ICD-10-CM | POA: Diagnosis not present

## 2019-11-20 DIAGNOSIS — Z5181 Encounter for therapeutic drug level monitoring: Secondary | ICD-10-CM

## 2019-11-20 DIAGNOSIS — R7881 Bacteremia: Secondary | ICD-10-CM | POA: Diagnosis not present

## 2019-11-20 MED ORDER — AMOXICILLIN-POT CLAVULANATE 875-125 MG PO TABS: 1.0000 | ORAL_TABLET | Freq: Two times a day (BID) | ORAL | 0 refills | Status: DC

## 2019-11-20 MED ORDER — CIPROFLOXACIN HCL 500 MG PO TABS: 500.0000 mg | ORAL_TABLET | Freq: Two times a day (BID) | ORAL | 0 refills | Status: DC

## 2019-11-20 NOTE — Progress Notes (Signed)
Patient: Courtney [name]  DOB: [birth year] MRN: [id number] PCP: Elsie Stain, MD    Patient Active Problem List   Diagnosis Date Noted  . Endocarditis 10/13/2019    Priority: High  . Liver abscess 10/13/2019    Priority: High  . Bacteremia due to Klebsiella pneumoniae, hypervirulent pathogen 10/13/2019    Priority: High  . Encounter for medication monitoring 11/20/2019  . Atherosclerosis of aorta ([place]) 10/15/2019  . Klebsiella infection 10/13/2019  . Pacemaker 05/29/2018  . Chronic anticoagulation 05/29/2018  . Paroxysmal atrial fibrillation ([place]) 09/04/2017  . Tachy-brady syndrome ([place])   . Chest pain   . Uncontrolled type 2 diabetes mellitus ([place]) 01/18/2016  . Chronic obstructive pulmonary disease ([place]) 01/18/2016  . Tobacco use disorder 01/18/2016  . Overweight (BMI 25.0-29.9) 01/18/2016     Subjective:  Courtney [name] is a 73 y.o. [place] woman here for hospital follow up regarding hypervirulent klebsiella bacteremia in the setting of disseminated infection involving liver abscesses and endocarditis of the mitral and aortic valve (did not appear to involve the pacemaker lead on TEE).   Since her hospitalization she has had a significant weight loss of nearly 30 lbs, only weighing 98 lb. She does report associated nausea and anorexia.She followed up with her PCP Dr. Joya Gaskins and was started on Megace for appetite stimulant which she has not picked up yet. She declined an antiemetic.   Denies any fevers/chills or night sweats. Her abdominal pain is better but not gone. The drain has to be emptied every 3 days when the JP is about 1/4 to 1/2 full; it is unclear if they continue to flush it. Denies leg swelling. She does describe anterior chest pain and difficulty breathing with a "feeling of heaviness" that is worse when she lays down at night. She needs about 2 pillows at night to feel comfortable. She has no coughing. This pain started started while she was in the hospital. She had  moderate sized bilateral pleural effusions on last chest CT scan that appeared to have increased.   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   In review of her lab work her leukocytosis has normalized, thrombocytosis is improving and her inflammatory markers are decreasing. She has not had a follow up CT scan since drain placement on 11/02.  Her grandson states that her PICC line was removed yesterday by home health; however end of therapy was supposed to be on 12/14 which was confusing to him.    Review of Systems  Constitutional: Positive for malaise/fatigue and weight loss. Negative for chills and fever.  HENT: Negative for sore throat.   Eyes: Negative for blurred vision.  Respiratory: Positive for shortness of breath. Negative for cough, sputum production and wheezing.   Cardiovascular: Positive for chest pain. Negative for leg swelling.  Genitourinary: Negative for dysuria, flank pain and urgency.  Musculoskeletal: Negative for back pain and joint pain.  Skin: Negative for rash.  Neurological: Negative for dizziness, focal weakness and headaches.    Past Medical History:  Diagnosis Date  . Allergy   . Atherosclerosis of aorta ([place]) 10/15/2019  . Diabetes mellitus type 2, diet-controlled ([place])   . PAF (paroxysmal atrial fibrillation) ([place]) 09/04/2017  . Sinus bradycardia 09/04/2017   HR 30s at times  . Tachy-brady syndrome ([place]) 09/04/2017    Outpatient Medications Prior to Visit  Medication Sig Dispense Refill  . apixaban (ELIQUIS) 5 MG TABS tablet  Take 1 tablet (5 mg total) by mouth 2 (two) times daily. 180 tablet 1  . cefTRIAXone (ROCEPHIN) IVPB Inject 2 g into the vein daily. Indication:  Klebsiella endocarditis/ bacteremia Last Day of Therapy:  11/26/2019 Labs - Once weekly:  CBC/D and BMP, Labs - Every other week:  ESR and CRP 36 Units 0  . flecainide (TAMBOCOR) 50 MG tablet Take 1 tablet (50 mg total) by mouth 2  (two) times daily. 180 tablet 0  . insulin aspart (NOVOLOG) 100 UNIT/ML injection Inject 4 Units into the skin 3 (three) times daily with meals. 10 mL 0  . Insulin Glargine (BASAGLAR KWIKPEN) 100 UNIT/ML SOPN Inject 0.2 mLs (20 Units total) into the skin daily. 15 mL 4  . Insulin Syringe-Needle U-100 (INSULIN SYRINGE .3CC/31GX5/16") 31G X 5/16" 0.3 ML MISC Use with insulin glargine and novolog insulin 100 each 2  . megestrol (MEGACE) 20 MG tablet Take 1 tablet (20 mg total) by mouth daily. 30 tablet 2  . metoprolol tartrate (LOPRESSOR) 25 MG tablet Take 0.5 tablets (12.5 mg total) by mouth 2 (two) times daily. 60 tablet 1  . oxyCODONE (OXY IR/ROXICODONE) 5 MG immediate release tablet Take 1 tablet (5 mg total) by mouth every 6 (six) hours as needed for severe pain. 14 tablet 0   No facility-administered medications prior to visit.      No Known Allergies  Social History   Tobacco Use  . Smoking status: Current Every Day Smoker  . Smokeless tobacco: Never Used  Substance Use Topics  . Alcohol use: No    Frequency: Never  . Drug use: No    Family History  Problem Relation Age of Onset  . Diabetes Son     Objective:   Vitals:   11/20/19 1053  BP: 105/70  Pulse: 73  Temp: 98 F (36.7 C)  Weight: 96 lb (43.5 kg)  Height: '4\' 5"'$  (1.346 m)   Body mass index is 24.03 kg/m.  Physical Exam Vitals signs reviewed.  Constitutional:      General: She is not in acute distress.    Appearance: She is not ill-appearing or toxic-appearing.     Comments: Thin, small woman seated comfortably in the chair  HENT:     Nose: Nose normal.     Mouth/Throat:     Mouth: Mucous membranes are moist.     Pharynx: Oropharynx is clear.  Eyes:     General: No scleral icterus.    Pupils: Pupils are equal, round, and reactive to light.  Neck:     Musculoskeletal: Normal range of motion.  Cardiovascular:     Rate and Rhythm: Normal rate and regular rhythm.     Pulses: Normal pulses.     Heart  sounds: No murmur.  Pulmonary:     Effort: Pulmonary effort is normal.     Breath sounds: Examination of the right-lower field reveals decreased breath sounds. Decreased breath sounds present. No wheezing, rhonchi or rales.  Abdominal:     General: Bowel sounds are normal. There is no distension.     Palpations: Abdomen is soft.     Tenderness: There is abdominal tenderness (RUQ where drain remains. Skin underlying looks a bit red but no purulent drainage).  Musculoskeletal:        General: No swelling.     Right lower leg: No edema.     Left lower leg: No edema.  Skin:    General: Skin is warm and dry.  Capillary Refill: Capillary refill takes less than 2 seconds.     Comments: RUE with bandage over PICC site.   Neurological:     General: No focal deficit present.     Mental Status: She is alert and oriented to person, place, and time.     Lab Results: Lab Results  Component Value Date   WBC 6.1 11/12/2019   HGB 10.5 (L) 11/12/2019   HCT 32.3 (L) 11/12/2019   MCV 93 11/12/2019   PLT 420 11/12/2019    Lab Results  Component Value Date   CREATININE 0.76 11/12/2019   BUN 20 11/12/2019   NA 138 11/12/2019   K 4.2 11/12/2019   CL 102 11/12/2019   CO2 22 11/12/2019    Lab Results  Component Value Date   ALT 13 11/12/2019   AST 18 11/12/2019   ALKPHOS 133 (H) 11/12/2019   BILITOT <0.2 11/12/2019     Assessment & Plan:   Problem List Items Addressed This Visit      High   Liver abscess - Primary    Still emptying her bulb every 3 days with green/brown contents that is described to be stringy. She has not followed up with IR drain clinic yet. I asked that her son arrange follow up this week.  I will order a repeat CT of the abd/pelvis to re-evaluate her infection. She still has some pain in the abdomen but overall improved. Will continue treating with PO Ciprofloxacin until repeat imaging is done. Follow up in 2 weeks with ID MD to re-evaluate.       Relevant  Orders   CBC   CMP   Endocarditis    On TEE 10/30 noted to have small filamentous strand-like material attached to the atrial surface of the mitral valve and just proximal to the aortic valve in the LVOT which could possibly represent vegetative material.  She cleared her blood cultures on 10/29 and has received Ceftriaxone for 40 days. Unfortunately her PICC was removed 1 week early by home health team for unclear reasons. Will not replace at this time being she will only be 2 days short for recommended endocarditis treatment and begin Ciprofloxacin 500 mg BID until we can repeat echo and CT scan to clarify correct length of therapy.       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 No signs of relapsing bacteremia today but last dose ceftriaxone yesterday 12/07.        Unprioritized   Encounter for medication monitoring    Check CMP with upcoming contrasted CT Scan.  CRP/ESR and CBC also to be drawn.  Her thrombocytosis has been improving (650 >> 575 >> 422 on 11/30) as have her inflammatory markers (CRP 75 >> 29  ESR 86 >> 56 last drawn on 11/23). Leukocytosis has resolved.       Relevant Orders   CBC   C-reactive protein   Sed Rate (ESR)   CMP   Chest pain    It sounds like she may be experiencing some orthopnea with her anterior chest pain. Denies any cough but reports SOB at times. She has slightly decreased breath sounds overlying the RLL > LLL but overall has good airflow. I worry she may not have fully decompressed liver abscess with increased pressure on diaphragm - will await CT results.  Other Visit Diagnoses    Abnormal results of liver function studies       Relevant Orders   Ambulatory referral to Interventional Radiology   ECHOCARDIOGRAM COMPLETE   [place], MSN, NP-C [place] [facility] Pager: [phone] Office: [phone]  11/20/19  12:50 PM

## 2019-11-20 NOTE — Assessment & Plan Note (Signed)
Check CMP with upcoming contrasted CT Scan.  CRP/ESR and CBC also to be drawn.  Her thrombocytosis has been improving (650 >> 575 >> 422 on 11/30) as have her inflammatory markers (CRP 75 >> 29  ESR 86 >> 56 last drawn on 11/23). Leukocytosis has resolved.

## 2019-11-20 NOTE — Assessment & Plan Note (Signed)
On TEE 10/30 noted to have small filamentous strand-like material attached to the atrial surface of the mitral valve and just proximal to the aortic valve in the LVOT which could possibly represent vegetative material.  She cleared her blood cultures on 10/29 and has received Ceftriaxone for 40 days. Unfortunately her PICC was removed 1 week early by home health team for unclear reasons. Will not replace at this time being she will only be 2 days short for recommended endocarditis treatment and begin Ciprofloxacin 500 mg BID until we can repeat echo and CT scan to clarify correct length of therapy.

## 2019-11-20 NOTE — Assessment & Plan Note (Signed)
Still emptying her bulb every 3 days with green/brown contents that is described to be stringy. She has not followed up with IR drain clinic yet. I asked that her son arrange follow up this week.  I will order a repeat CT of the abd/pelvis to re-evaluate her infection. She still has some pain in the abdomen but overall improved. Will continue treating with PO Ciprofloxacin until repeat imaging is done. Follow up in 2 weeks with ID MD to re-evaluate.

## 2019-11-20 NOTE — Assessment & Plan Note (Signed)
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 No signs of relapsing bacteremia today but last dose ceftriaxone yesterday 12/07.

## 2019-11-20 NOTE — Progress Notes (Addendum)
[facility] made aware that nurse with [facility] pulled picc line on 11/19/19 without orders per Arlyn Leak (Director of Operations)  Prior Auth obtained for CT Approved [id number]) no prior authorization needed for Echo.   Ct scheduled for 11/26/19 Echo scheduled for 11/23/19

## 2019-11-20 NOTE — Patient Instructions (Signed)
Please start your antibiotic (Augmentin) one pill twice a day.  If you have nausea with it can take with food.  If you need nausea medication please call to let us know.   Please stop by the lab today before you leave to draw your blood.   Will need to get you set up with a repeat scan of your liver and ultrasound of your heart to make sure we can stop antibiotics.   Please come back in 2 weeks to see either Dr. Baxter Flattery or Dr. Linus Salmons once the studies are complete so we can review timing to stop your antibiotics.

## 2019-11-20 NOTE — Assessment & Plan Note (Signed)
It sounds like she may be experiencing some orthopnea with her anterior chest pain. Denies any cough but reports SOB at times. She has slightly decreased breath sounds overlying the RLL > LLL but overall has good airflow. I worry she may not have fully decompressed liver abscess with increased pressure on diaphragm - will await CT results.

## 2019-11-21 DIAGNOSIS — R7881 Bacteremia: Secondary | ICD-10-CM | POA: Diagnosis not present

## 2019-11-21 DIAGNOSIS — I38 Endocarditis, valve unspecified: Secondary | ICD-10-CM | POA: Diagnosis not present

## 2019-11-21 LAB — CBC
HCT: 35.4 % (ref 35.0–45.0)
Hemoglobin: 11.5 g/dL — ABNORMAL LOW (ref 11.7–15.5)
MCH: 30.7 pg (ref 27.0–33.0)
MCHC: 32.5 g/dL (ref 32.0–36.0)
MCV: 94.7 fL (ref 80.0–100.0)
MPV: 10.1 fL (ref 7.5–12.5)
Platelets: 383 10*3/uL (ref 140–400)
RBC: 3.74 10*6/uL — ABNORMAL LOW (ref 3.80–5.10)
RDW: 13.9 % (ref 11.0–15.0)
WBC: 9.5 10*3/uL (ref 3.8–10.8)

## 2019-11-21 LAB — COMPREHENSIVE METABOLIC PANEL
AG Ratio: 1.3 (calc) (ref 1.0–2.5)
ALT: 17 U/L (ref 6–29)
AST: 24 U/L (ref 10–35)
Albumin: 4.1 g/dL (ref 3.6–5.1)
Alkaline phosphatase (APISO): 104 U/L (ref 37–153)
BUN: 19 mg/dL (ref 7–25)
CO2: 23 mmol/L (ref 20–32)
Calcium: 9.5 mg/dL (ref 8.6–10.4)
Chloride: 102 mmol/L (ref 98–110)
Creat: 0.63 mg/dL (ref 0.60–0.93)
Globulin: 3.1 g/dL (calc) (ref 1.9–3.7)
Glucose, Bld: 149 mg/dL — ABNORMAL HIGH (ref 65–99)
Potassium: 4.3 mmol/L (ref 3.5–5.3)
Sodium: 136 mmol/L (ref 135–146)
Total Bilirubin: 0.3 mg/dL (ref 0.2–1.2)
Total Protein: 7.2 g/dL (ref 6.1–8.1)

## 2019-11-21 LAB — C-REACTIVE PROTEIN: CRP: 4.3 mg/L (ref <8.0)

## 2019-11-21 LAB — SEDIMENTATION RATE: Sed Rate: 17 mm/h (ref 0–30)

## 2019-11-21 NOTE — Progress Notes (Signed)
Inflammatory markers are improved and actually in the normal range now. Awaiting repeat CT scan and TTE to ensure she has had appropriate length of therapy.

## 2019-11-22 DIAGNOSIS — R7881 Bacteremia: Secondary | ICD-10-CM | POA: Diagnosis not present

## 2019-11-22 DIAGNOSIS — I38 Endocarditis, valve unspecified: Secondary | ICD-10-CM | POA: Diagnosis not present

## 2019-11-23 ENCOUNTER — Ambulatory Visit (HOSPITAL_COMMUNITY): Admission: RE | Admit: 2019-11-23 | Payer: Medicare HMO | Source: Ambulatory Visit

## 2019-11-23 DIAGNOSIS — R7881 Bacteremia: Secondary | ICD-10-CM | POA: Diagnosis not present

## 2019-11-23 DIAGNOSIS — I38 Endocarditis, valve unspecified: Secondary | ICD-10-CM | POA: Diagnosis not present

## 2019-11-24 DIAGNOSIS — R7881 Bacteremia: Secondary | ICD-10-CM | POA: Diagnosis not present

## 2019-11-24 DIAGNOSIS — I38 Endocarditis, valve unspecified: Secondary | ICD-10-CM | POA: Diagnosis not present

## 2019-11-25 DIAGNOSIS — I38 Endocarditis, valve unspecified: Secondary | ICD-10-CM | POA: Diagnosis not present

## 2019-11-25 DIAGNOSIS — R7881 Bacteremia: Secondary | ICD-10-CM | POA: Diagnosis not present

## 2019-11-25 NOTE — Progress Notes (Signed)
Cardiology Office Note Date:  11/27/2019  Patient ID:  Courtney, [name] [birth year], MRN [id number] PCP:  Elsie Stain, MD  Cardiologist:  Dr. Oval Linsey (hospital consult 2018) Electrophysiologist: Dr. Caryl Comes    Chief Complaint:  annual device visit, post-hospital, pre-op/colonoscopy    History of Present Illness: Courtney [name] is a 73 y.o. female with history of DM, tachy-brady w/PPM, AFib.  She comes in today to be seen for Dr. Caryl Comes, last seen by him Sept 2019, at that time having some intermittent Afib symptomatic and planned for Afib clinic follow up possible 1c AAD after an echo.  Planned to update her labs including HgbAq1c and TSH.  Her BP was low and her metoprolol reduced.  More recently she was in [facility] admitted 10/06/2019 with altered mental status, found with DKA, and + Klebsieflla bacteremia, pneumonia, + large liver abscess and had TEE with questionable vegetation on the mitral valve/AV  TEE noted "In the setting of current bacteremia and implanted pacemaker, there do appear to be small filamentous strand-like material attached to the atrial surface of the mitral valve and just proximal to the aortic valve in the LVOT which could possibly represent vegetative material. This does not have the clinical appearance of the typical thick or shaggy vegetation. Nonetheless, consider treatment for possible endocarditis" Dr. Lovena Le was consulted "The patient has Klebsiella bacteremia with possible involvement of the mitral and aortic valve but no veg seen on PM leads. I would recommend a full course of anti-biotic therapy as per the ID service followed by surviellance blood cultures. While this is thought to be a hypervirulent strain of Klebsiella, these germs usually do not stick to pacing/ICD leads. Obviously if she had treatment failure with recurrent bacteremia after a full course of anti-biotics, then extraction of the pacing system would be required."  She was last seen in-patient by  Dr. Oval Linsey, at that time was s/p liver asbcess drainage.  She was off her a/c with recent IR procedure, her metoprolol had been held (seems 2/2 hypotension early in the hospital stay) and recommended consideration for resumption in clinic. Planned to complete antibiotic course, no device extraction unless failed to clear blood cultures.cardiology signed off 10/18/2019. She was discharged 10/18/2019  Follow up with ID 11/20/2019 JP drain was being emptied about Q 3 days, reported 1/4-1/2 full Pt/family reported PICC line was removed 12/7, though family thought abx were to continue through 12/14. Pt c/o chest heaviness and SOB, symptoms of orthopnea requiring more pillows (and lueral effusions noted at her hospital stay)labs were improving  Started on PO Ciprofloxacin, planned for repeat CT, and follow up with IR drain clinic picc line was indeed removed early (unclear why) though not planned to replace and PO abx as noted pending her CT finding. BC reported cleared 10/11/2019 CP/SOB, ID NP noted "slightly decreased breath sounds overlying the RLL > LLL but overall has good airflow. I worry she may not have fully decompressed liver abscess with increased pressure on diaphragm" also ordered for an echo though this appears to have been cancelled.  Seems she is scheduled for screening colonoscopy and requiring cardiology evaluation/clearance as well as Eliquis recommendations pre-colonoscopy. GI mentioned MRI/MRCP would be optimal for surveillance/management of her gI issues, though unable with PPM   ID mentions she had lost 30lbs with her infection, hospitalization, post hospitalization, weighing 98lbs, her PMD prescribed Megace, though not yet started.  10/08/2019 CXR Slight interval worsening bilateral patchy airspace process likely multifocal infection.  10/14/2019 CT  abd/pelvis 4. Moderate-sized bilateral pleural effusions, increased. 5. Progressive compressive atelectasis in both lower  lobes.    TODAY Today's visit is done with the aid of [employer] interpretor ([place] [name], [phone] She is accompanied by her grandson She is doing well, mentions she had no cardiac concerns, feels like her heart is OK.  She remains with some heaviness to her breath, not overtly SOB at rest, will get winded with ambulation and mentions that laying supine is harder for her to breathe as well.  Our elevator is broken this AM and she walked up the 3 flights of stairs, her grandson reported she did fine, was "a little SOB at the top"  She agrees, says she wad tired/out of breath once reaching the final steps, no CP. She had a R sided thorax pai, locates primary to her axilla, lateral thorax to R chest, this comes/goes, raisingher arm up makes it worse, can provoke it.  She denies any difficulties with her ADLs  No palpitations, no dizzy spells, near syncope or syncope. She denies any bleeding or signs of bleeding  Her grandson confirmed accurate medicine list with the  MA today.    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 Allergy   . Atherosclerosis of aorta (HCC) 10/15/2019  . Diabetes mellitus type 2, diet-controlled (HCC)   . PAF (paroxysmal atrial fibrillation) (HCC) 09/04/2017  . Sinus bradycardia 09/04/2017   HR 30s at times  . Tachy-brady syndrome (HCC) 09/04/2017    Past Surgical History:  Procedure Laterality Date  . IR GUIDED DRAIN W CATHETER PLACEMENT  10/15/2019  . IR PARACENTESIS  10/10/2019  . IR US GUIDE BX ASP/DRAIN  10/10/2019  . PACEMAKER IMPLANT N/A 09/05/2017   Procedure: Pacemaker Implant;  Surgeon: Duke Salvia, MD;  Location: [facility] INVASIVE CV LAB;  Service: Cardiovascular;  Laterality: N/A;  . TEE WITHOUT CARDIOVERSION N/A 10/12/2019   Procedure: TRANSESOPHAGEAL ECHOCARDIOGRAM (TEE);  Surgeon: Jake Bathe, MD;  Location: [facility]  ENDOSCOPY;  Service: Cardiovascular;  Laterality: N/A;    Current Outpatient Medications  Medication Sig Dispense Refill  . amoxicillin-clavulanate (AUGMENTIN) 875-125 MG tablet Take 1 tablet by mouth 2 (two) times daily. 60 tablet 0  . apixaban (ELIQUIS) 5 MG TABS tablet Take 1 tablet (5 mg total) by mouth 2 (two) times daily. 180 tablet 1  . ciprofloxacin (CIPRO) 500 MG tablet Take 1 tablet (500 mg total) by mouth 2 (two) times daily. 60 tablet 0  . flecainide (TAMBOCOR) 50 MG tablet Take 1 tablet (50 mg total) by mouth 2 (two) times daily. 180 tablet 0  . insulin aspart (NOVOLOG) 100 UNIT/ML injection Inject 4 Units into the skin 3 (three) times daily with meals. 10 mL 0  . Insulin Glargine (BASAGLAR KWIKPEN) 100 UNIT/ML SOPN Inject 0.2 mLs (20 Units total) into the skin daily. 15 mL 4  . Insulin Syringe-Needle U-100 (INSULIN SYRINGE .3CC/31GX5/16") 31G X 5/16" 0.3 ML MISC Use with insulin glargine and novolog insulin 100 each 2  . megestrol (MEGACE) 20 MG tablet Take 1 tablet (20 mg total) by mouth daily. 30 tablet 2  . metoprolol tartrate (LOPRESSOR) 25 MG tablet Take 0.5 tablets (12.5 mg total) by mouth 2 (two) times daily. 60 tablet 1  . oxyCODONE (OXY IR/ROXICODONE) 5 MG immediate release tablet Take 1 tablet (5 mg total) by mouth every 6 (six)  hours as needed for severe pain. 14 tablet 0   No current facility-administered medications for this visit.    Allergies:   Patient has no known allergies.   Social History:  The patient  reports that she has been smoking. She has never used smokeless tobacco. She reports that she does not drink alcohol or use drugs.   Family History:  The patient's family history includes Diabetes in her son.  ROS:  Please see the history of present illness.  All other systems are reviewed and otherwise negative.   PHYSICAL EXAM:  VS:  BP 108/72   Pulse 60   Ht 4\' 5"  (1.346 m)   Wt 97 lb (44 kg)   BMI 24.28 kg/m  BMI: Body mass index is 24.28  kg/m. Well nourished, well developed, in no acute distress  HEENT: normocephalic, atraumatic  Neck: no JVD, carotid bruits or masses Cardiac:  RRR; no significant murmurs, no rubs, or gallops Lungs:  CTA b/l, no wheezing, rhonchi or rales  Abd: soft, nontender, R abd dressing/drain, is clean, dry. MS: no deformity, very petite, thin body habitus, age appropriate atrophy Ext: no edema  Skin: warm and dry, no rash Neuro:  No gross deficits appreciated Psych: euthymic mood, full affect  PPM site is stable, no tethering or discomfort   EKG:  Done today and reviewed by myself shows AP/VS, PR , QRS 53ms, QTc 91m  PPM interrogation done today and reviewed by myself;  Battery and lead measurements are good He presents AP/VS, she has an underlying SB 40's-50's tody AF burden 3.1% +AMS episodes 4 EGMs available for review  AFib brief, and a few are brief FF sensing 10/06/2019 HVR, is AFib w/RVR (knwn during her hospitalization)   10/12/2019: TEE IMPRESSIONS 1. Left ventricular ejection fraction, by visual estimation, is 55 to 60%. The left ventricle has normal function. There is no left ventricular hypertrophy.  2. Global right ventricle has normal systolic function.The right ventricular size is normal. No increase in right ventricular wall thickness.  3. No vegetations on pacer wires.  4. Left atrial size was normal.  5. Right atrial size was normal.  6. Mild mitral valve prolapse.  7. The mitral valve is abnormal. Mild mitral valve regurgitation. No evidence of mitral stenosis.  8. No trisuspid valve vegetation visualized.  9. The tricuspid valve is normal in structure. Tricuspid valve regurgitation is mild. 10. The aortic valve is tricuspid. Aortic valve regurgitation is not visualized. 11. No pulmonic vegetation present. 12. The pulmonic valve was grossly normal. Pulmonic valve regurgitation is not visualized. 13. Moderate plaque invoving the descending aorta. 14. A  pacer wire is visualized in the RV and RA. 15. In the setting of current bacteremia and implanted pacemaker, there do appear to be small filamentous strand-like material attached to the atrial surface of the mitral valve and just proximal to the aortic valve in the LVOT which could possibly  represent vegetative material. This does not have the clinical appearance of the typical thick or shaggy vegetation. Nonetheless, consider treatment for possible endocarditis.  FINDINGS  Left Ventricle: Left ventricular ejection fraction, by visual estimation, is 55 to 60%. The left ventricle has normal function. There is no left ventricular hypertrophy.  Right Ventricle: The right ventricular size is normal. No increase in right ventricular wall thickness. Global RV systolic function is has normal systolic function. No vegetations on pacer wires.  Left Atrium: Left atrial size was normal in size. No thrombus.  Right Atrium: Right atrial  size was normal in size  Pericardium: There is no evidence of pericardial effusion. There is a small pleural effusion in the left lateral region.  Mitral Valve: The mitral valve is abnormal. There is mild holosystolic prolapse of of the mitral valve. No evidence of mitral valve stenosis by observation. Mild mitral valve regurgitation. There are faint, filamentous strands attached to the atrial  surface of the mitral valve leaflets, freely mobile, challenging to visualize. This may represent vegetative material versus degenerative material of mitral valve. They are not as robust as ruptured cords.  Tricuspid Valve: The tricuspid valve is normal in structure. Tricuspid valve regurgitation is mild. No TV vegetation was visualized.  Aortic Valve: The aortic valve is tricuspid. Aortic valve regurgitation is not visualized.  Pulmonic Valve: The pulmonic valve was grossly normal. Pulmonic valve regurgitation is not visualized. No pulmonic vegetation present.  Aorta:  The aortic root is normal in size and structure. There is moderate plaque involving the descending aorta. There are small filamentous strands attached to a segment of the left ventricular outflow tract approximately 5 mm below the aortic valve  leaflets that are freely mobile, possibly representing vegetative material.  Shunts: No atrial level shunt detected by color flow Doppler.  Additional Comments: In the setting of current bacteremia and implanted pacemaker, there do appear to be small filamentous strand-like material attached to the atrial surface of the mitral valve and just proximal to the aortic valve in the LVOT which  could possibly represent vegetative material. This does not have the clinical appearance of the typical thick or shaggy vegetation. Nonetheless, consider treatment for possible endocarditis. A pacer wire is visualized in the right ventricle and right  atrium.   Recent Labs: 10/08/2019: TSH 0.894 10/18/2019: Magnesium 1.8 11/20/2019: ALT 17; BUN 19; Creat 0.63; Hemoglobin 11.5; Platelets 383; Potassium 4.3; Sodium 136  10/16/2019: Cholesterol 110; HDL 14; LDL Cholesterol 77; Total CHOL/HDL Ratio 7.9; Triglycerides 96; VLDL 19   Estimated Creatinine Clearance: 35.3 mL/min (by C-G formula based on SCr of 0.63 mg/dL).   Wt Readings from Last 3 Encounters:  11/27/19 97 lb (44 kg)  11/20/19 96 lb (43.5 kg)  11/13/19 97 lb 3.2 oz (44.1 kg)     Other studies reviewed: Additional studies/records reviewed today include: summarized above  ASSESSMENT AND PLAN:  1. PPM     Intact function, no programming changes made  2. Paroxysmal Afib     CHA2DS2Vasc is 3, on Eliquis, appropriately dosed     On flecainide and metoprolol     She denies any bleeding or signs of bleeding  3. Recent sepsis, bacteremia, Klebsiella     Possible involvement of MV/AV, (TEE noted above), no clear involvement on leads     Recommended surveillance blood cultures once ABX tx completed     not  off ABX yet  She is scheduled for an echo tomorrow via ID service  They are instructed to follow up with ID about her CT abdomen that was scheduled for yesterday.      4. CP, SOB     CP does not sound of cardiac etiology, suspect 2/2 drain, abscess, musculoskeletal     She walked up 3 flights of stairs today without CP  SOB Has a component of orthopnea, though unusual sounding persistent feeling of breathlessness Her grandson said she walked up the stairs fine, only mildly SOB at the top Her exam does not suggest overt volume OL She was to have had a CT abdomen yesterday though  not done, will get CXR done to eval for pleural effusion   5. Pre-colonoscopy eval     She has some CP that I suspect is not cardiac, and some SOB that I am less clear about     I would prefer to re-evaluate her/symptoms once she has the drain out, evaluate her CXR and follow up echo     RCRI score is low.     We will have her back mid-late Jan, GI notes planned for colonoscopy Jan-Feb time.  Will see her back prior to that     I do not anticipate and objection to procedure or holding the Eliquis, though will make final comment next visit     Disposition: F/u with as above  Current medicines are reviewed at length with the patient today.  The patient did not have any concerns regarding medicines.  Norma Fredrickson, PA-C 11/27/2019 11:08 AM     [facility] [address] [place] [address] [phone] (office)  [phone] (fax)

## 2019-11-26 ENCOUNTER — Ambulatory Visit (HOSPITAL_COMMUNITY): Payer: Medicare HMO

## 2019-11-26 DIAGNOSIS — R7881 Bacteremia: Secondary | ICD-10-CM | POA: Diagnosis not present

## 2019-11-26 DIAGNOSIS — I480 Paroxysmal atrial fibrillation: Secondary | ICD-10-CM | POA: Diagnosis not present

## 2019-11-26 DIAGNOSIS — I330 Acute and subacute infective endocarditis: Secondary | ICD-10-CM | POA: Diagnosis not present

## 2019-11-26 DIAGNOSIS — E1165 Type 2 diabetes mellitus with hyperglycemia: Secondary | ICD-10-CM | POA: Diagnosis not present

## 2019-11-26 DIAGNOSIS — Z794 Long term (current) use of insulin: Secondary | ICD-10-CM | POA: Diagnosis not present

## 2019-11-26 DIAGNOSIS — Z950 Presence of cardiac pacemaker: Secondary | ICD-10-CM | POA: Diagnosis not present

## 2019-11-26 DIAGNOSIS — K750 Abscess of liver: Secondary | ICD-10-CM | POA: Diagnosis not present

## 2019-11-26 DIAGNOSIS — B961 Klebsiella pneumoniae [K. pneumoniae] as the cause of diseases classified elsewhere: Secondary | ICD-10-CM | POA: Diagnosis not present

## 2019-11-26 DIAGNOSIS — I38 Endocarditis, valve unspecified: Secondary | ICD-10-CM | POA: Diagnosis not present

## 2019-11-26 DIAGNOSIS — J150 Pneumonia due to Klebsiella pneumoniae: Secondary | ICD-10-CM | POA: Diagnosis not present

## 2019-11-26 DIAGNOSIS — Z7901 Long term (current) use of anticoagulants: Secondary | ICD-10-CM | POA: Diagnosis not present

## 2019-11-26 NOTE — Progress Notes (Signed)
Dr. Rush Landmark, pls review ID consult. FYI patient was scheduled for an abd/pelvic CT today, results not yet in Epic.

## 2019-11-27 ENCOUNTER — Ambulatory Visit (INDEPENDENT_AMBULATORY_CARE_PROVIDER_SITE_OTHER): Payer: Medicare HMO | Admitting: Physician Assistant

## 2019-11-27 ENCOUNTER — Other Ambulatory Visit: Payer: Self-pay

## 2019-11-27 VITALS — BP 108/72 | HR 60 | Ht <= 58 in | Wt 97.0 lb

## 2019-11-27 DIAGNOSIS — I480 Paroxysmal atrial fibrillation: Secondary | ICD-10-CM

## 2019-11-27 DIAGNOSIS — I495 Sick sinus syndrome: Secondary | ICD-10-CM

## 2019-11-27 DIAGNOSIS — R0789 Other chest pain: Secondary | ICD-10-CM

## 2019-11-27 DIAGNOSIS — Z950 Presence of cardiac pacemaker: Secondary | ICD-10-CM | POA: Diagnosis not present

## 2019-11-27 DIAGNOSIS — R0602 Shortness of breath: Secondary | ICD-10-CM

## 2019-11-27 NOTE — Patient Instructions (Addendum)
Medication Instructions:   Your physician recommends that you continue on your current medications as directed. Please refer to the Current Medication list given to you today.  *If you need a refill on your cardiac medications before your next appointment, please call your pharmacy*  Lab Work: Media  If you have labs (blood work) drawn today and your tests are completely normal, you will receive your results only by: Marland Kitchen MyChart Message (if you have MyChart) OR . A paper copy in the mail If you have any lab test that is abnormal or we need to change your treatment, we will call you to review the results.  Testing/Procedures: A chest x-ray takes a picture of the organs and structures inside the chest, including the heart, lungs, and blood vessels. This test can show several things, including, whether the heart is enlarges; whether fluid is building up in the lungs; and whether pacemaker / defibrillator leads are still in place.  [address] [facility] ) [facility]   Follow-Up: At [facility], you and your health needs are our priority.  As part of our continuing mission to provide you with exceptional heart care, we have created designated Provider Care Teams.  These Care Teams include your primary Cardiologist (physician) and Advanced Practice Providers (APPs -  Physician Assistants and Nurse Practitioners) who all work together to provide you with the care you need, when you need it.  Your next appointment:   6 week(s)  The format for your next appointment:   In Person  Provider:   You may see Virl Axe, MD or one of the following Advanced Practice Providers on your designated Care Team:    Tommye Standard, [place]  Legrand Como "Jonni Sanger" Chalmers Cater, [place]   Other Instructions:

## 2019-11-28 ENCOUNTER — Ambulatory Visit (HOSPITAL_COMMUNITY): Admission: RE | Admit: 2019-11-28 | Payer: Medicare HMO | Source: Ambulatory Visit

## 2019-11-28 ENCOUNTER — Telehealth (HOSPITAL_COMMUNITY): Payer: Self-pay

## 2019-11-28 ENCOUNTER — Telehealth: Payer: Self-pay

## 2019-11-28 NOTE — Telephone Encounter (Signed)
Called to schedule ct abd/pelvis, no answer, left vm. AW

## 2019-11-28 NOTE — Telephone Encounter (Signed)
Received call from [place] with IR questioning if patient had CT scan already. Patient was scheduled on 11/26/19 but did not show up for appointment. IR is able to preform CT at [facility]. Faxed CT orders to [place] specifying [place] approval with location at [facility] to get patient rescheduled for CT and scheduled for drain check. Courtney [name]

## 2019-11-30 ENCOUNTER — Ambulatory Visit (HOSPITAL_COMMUNITY)
Admission: RE | Admit: 2019-11-30 | Discharge: 2019-11-30 | Disposition: A | Discharge status: Home / Self Care | Payer: Medicare HMO | Source: Ambulatory Visit | Attending: Infectious Diseases | Admitting: Infectious Diseases

## 2019-11-30 ENCOUNTER — Ambulatory Visit (HOSPITAL_COMMUNITY)
Admission: RE | Admit: 2019-11-30 | Discharge: 2019-11-30 | Disposition: A | Discharge status: Home / Self Care | Payer: Medicare HMO | Source: Ambulatory Visit | Attending: Physician Assistant | Admitting: Physician Assistant

## 2019-11-30 ENCOUNTER — Other Ambulatory Visit: Payer: Self-pay

## 2019-11-30 DIAGNOSIS — R0602 Shortness of breath: Secondary | ICD-10-CM

## 2019-11-30 DIAGNOSIS — R945 Abnormal results of liver function studies: Secondary | ICD-10-CM

## 2019-11-30 DIAGNOSIS — R079 Chest pain, unspecified: Secondary | ICD-10-CM | POA: Diagnosis not present

## 2019-11-30 DIAGNOSIS — I38 Endocarditis, valve unspecified: Secondary | ICD-10-CM | POA: Insufficient documentation

## 2019-11-30 DIAGNOSIS — Z950 Presence of cardiac pacemaker: Secondary | ICD-10-CM | POA: Insufficient documentation

## 2019-11-30 DIAGNOSIS — I4891 Unspecified atrial fibrillation: Secondary | ICD-10-CM | POA: Diagnosis not present

## 2019-11-30 NOTE — Progress Notes (Signed)
  Echocardiogram 2D Echocardiogram has been performed.  Courtney [name] 11/30/2019, 9:09 AM

## 2019-12-03 ENCOUNTER — Telehealth: Payer: Self-pay | Admitting: Critical Care Medicine

## 2019-12-03 DIAGNOSIS — B961 Klebsiella pneumoniae [K. pneumoniae] as the cause of diseases classified elsewhere: Secondary | ICD-10-CM

## 2019-12-03 DIAGNOSIS — A498 Other bacterial infections of unspecified site: Secondary | ICD-10-CM

## 2019-12-03 DIAGNOSIS — R7881 Bacteremia: Secondary | ICD-10-CM

## 2019-12-03 NOTE — Telephone Encounter (Signed)
I tried calling grandson who interprets for this patient.  The patient needs a CT chest without contrast to f/u pneumonia.  I put the order in and can you try to contact [name] the grandson to let him know

## 2019-12-03 NOTE — Telephone Encounter (Signed)
-----   Message from [place], [place] sent at 11/30/2019  3:58 PM EST ----- CXR does not suggest heart failure/fluid accumulation.  Though has some new findings, of unclear etiology, not certain if this represents infection, new pneumonia .  I will forward to infectious disease and her primary for evaluation and recommendations  She was minimally symptomatic when I saw (able to walk up 3 flights of stairs) and did not appear ill or c/o symptoms of fever, infection, illness.    I will try and get in touch with ID, though please have them follow up with the ID and primary doctors for their review and recommendations.  If she is having symptoms of illness though she needs to be seen/evaluated  [employer]

## 2019-12-04 ENCOUNTER — Telehealth (HOSPITAL_COMMUNITY): Payer: Self-pay

## 2019-12-04 DIAGNOSIS — Z794 Long term (current) use of insulin: Secondary | ICD-10-CM | POA: Diagnosis not present

## 2019-12-04 DIAGNOSIS — J150 Pneumonia due to Klebsiella pneumoniae: Secondary | ICD-10-CM | POA: Diagnosis not present

## 2019-12-04 DIAGNOSIS — I330 Acute and subacute infective endocarditis: Secondary | ICD-10-CM | POA: Diagnosis not present

## 2019-12-04 DIAGNOSIS — Z950 Presence of cardiac pacemaker: Secondary | ICD-10-CM | POA: Diagnosis not present

## 2019-12-04 DIAGNOSIS — Z7901 Long term (current) use of anticoagulants: Secondary | ICD-10-CM | POA: Diagnosis not present

## 2019-12-04 DIAGNOSIS — B961 Klebsiella pneumoniae [K. pneumoniae] as the cause of diseases classified elsewhere: Secondary | ICD-10-CM | POA: Diagnosis not present

## 2019-12-04 DIAGNOSIS — I480 Paroxysmal atrial fibrillation: Secondary | ICD-10-CM | POA: Diagnosis not present

## 2019-12-04 DIAGNOSIS — E1165 Type 2 diabetes mellitus with hyperglycemia: Secondary | ICD-10-CM | POA: Diagnosis not present

## 2019-12-04 DIAGNOSIS — K750 Abscess of liver: Secondary | ICD-10-CM | POA: Diagnosis not present

## 2019-12-04 NOTE — Telephone Encounter (Signed)
Called Courtney [name] to schedule CT abdomen, no answer, no vm. AW

## 2019-12-05 ENCOUNTER — Telehealth: Payer: Self-pay

## 2019-12-05 ENCOUNTER — Other Ambulatory Visit: Payer: Self-pay

## 2019-12-05 ENCOUNTER — Encounter: Payer: Self-pay | Admitting: Internal Medicine

## 2019-12-05 ENCOUNTER — Other Ambulatory Visit (HOSPITAL_COMMUNITY): Payer: Self-pay | Admitting: Internal Medicine

## 2019-12-05 ENCOUNTER — Ambulatory Visit (INDEPENDENT_AMBULATORY_CARE_PROVIDER_SITE_OTHER): Payer: Medicare HMO | Admitting: Internal Medicine

## 2019-12-05 DIAGNOSIS — K750 Abscess of liver: Secondary | ICD-10-CM

## 2019-12-05 DIAGNOSIS — A498 Other bacterial infections of unspecified site: Secondary | ICD-10-CM

## 2019-12-05 DIAGNOSIS — I330 Acute and subacute infective endocarditis: Secondary | ICD-10-CM

## 2019-12-05 MED ORDER — AMOXICILLIN-POT CLAVULANATE 875-125 MG PO TABS: 1.0000 | ORAL_TABLET | Freq: Two times a day (BID) | ORAL | 0 refills | Status: DC

## 2019-12-05 NOTE — Telephone Encounter (Signed)
Courtney [name] with Interventional Radiology to schedule patient drain removal; Courtney [name] stated that she has tried to schedule a CT with the patient and has not been able to get in contact with anyone. Provided the phone number for her family member who attended the appointment. Courtney [name] is going to attempt to call again and will contact us with any issues. Also going to confirm whether or not patient needs CT prior to removal. Provider notified.   [name] Lorita Officer, RN

## 2019-12-06 NOTE — Assessment & Plan Note (Addendum)
We have alerted the drain clinic and will get her in for a repeat CT and potential drain removal.   I will have her continue with the Augmentin alone in the meantime.  Will have them do the CT of the chest already ordered by Dr. Joya Gaskins at the same time.

## 2019-12-06 NOTE — Progress Notes (Signed)
   Subjective:    Patient ID: Courtney [name], female    DOB: 02-03-1946, 73 y.o.   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  She was treated for 5 weeks and transitioned to oral therapy with both cipro and Augmentin.  She had been scheduled to have a follow up CT and appointment with the drain clinic but did not keep the appointment.  The grandson who is with the patient was unaware.  The IR drain clinic has tried calling the patient multiple times to get her scheduled.  She has no current fever, chills.  The drain has minimal drainage coming out of it.   She has established care with Dr. Joya Gaskins.  She had a CXR and he has requested a CT of her chest.  They were not aware of this yet.     Review of Systems  Constitutional: Negative for chills, fatigue and fever.  Respiratory: Negative for cough.   Gastrointestinal: Negative for diarrhea and nausea.  Skin: Negative for rash.       Objective:   Physical Exam Constitutional:      Appearance: Normal appearance.  Eyes:     General: No scleral icterus. Cardiovascular:     Rate and Rhythm: Tachycardia present.     Heart sounds: No murmur.  Pulmonary:     Effort: Pulmonary effort is normal.     Breath sounds: Normal breath sounds.  Abdominal:     Comments: JP with no drainage noted in it.  Neurological:     General: No focal deficit present.     Mental Status: She is alert.  Psychiatric:        Mood and Affect: Mood normal.   SH: + tobacco        Assessment & Plan:

## 2019-12-06 NOTE — Assessment & Plan Note (Signed)
Disseminated infection and appears to be resolving.   Will continue with above for 4 weeks and reassess

## 2019-12-06 NOTE — Assessment & Plan Note (Signed)
Repeat TTE looks good, no further vegetation noted.

## 2019-12-11 ENCOUNTER — Telehealth: Payer: Self-pay | Admitting: General Surgery

## 2019-12-11 NOTE — Telephone Encounter (Signed)
[facility] has this patient on her list to schedule in February 2021 for a colonoscopy

## 2019-12-11 NOTE — Telephone Encounter (Signed)
-----   Message from Letta Pate, [place] sent at 11/13/2019  4:03 PM EST ----- Regarding: schedule colonoscopy The patient needs to have a colonoscopy scheduled with Dr Rush Landmark. She will need to have a cardiac clearance prior to scheduling. It is also recommended by Cottie Banda to  discontinue her Eliquis 3 days prior.

## 2019-12-12 ENCOUNTER — Telehealth: Payer: Self-pay | Admitting: Critical Care Medicine

## 2019-12-12 DIAGNOSIS — J150 Pneumonia due to Klebsiella pneumoniae: Secondary | ICD-10-CM | POA: Diagnosis not present

## 2019-12-12 DIAGNOSIS — K750 Abscess of liver: Secondary | ICD-10-CM | POA: Diagnosis not present

## 2019-12-12 DIAGNOSIS — Z7901 Long term (current) use of anticoagulants: Secondary | ICD-10-CM | POA: Diagnosis not present

## 2019-12-12 DIAGNOSIS — E1165 Type 2 diabetes mellitus with hyperglycemia: Secondary | ICD-10-CM | POA: Diagnosis not present

## 2019-12-12 DIAGNOSIS — B961 Klebsiella pneumoniae [K. pneumoniae] as the cause of diseases classified elsewhere: Secondary | ICD-10-CM | POA: Diagnosis not present

## 2019-12-12 DIAGNOSIS — I480 Paroxysmal atrial fibrillation: Secondary | ICD-10-CM | POA: Diagnosis not present

## 2019-12-12 DIAGNOSIS — Z950 Presence of cardiac pacemaker: Secondary | ICD-10-CM | POA: Diagnosis not present

## 2019-12-12 DIAGNOSIS — I330 Acute and subacute infective endocarditis: Secondary | ICD-10-CM | POA: Diagnosis not present

## 2019-12-12 DIAGNOSIS — Z794 Long term (current) use of insulin: Secondary | ICD-10-CM | POA: Diagnosis not present

## 2019-12-12 NOTE — Telephone Encounter (Signed)
Patient called asking for the sliding scale for the novolog that they put her on. Please fax that to Manuela Schwartz at [phone] .

## 2019-12-17 ENCOUNTER — Ambulatory Visit: Payer: Medicare HMO | Admitting: Critical Care Medicine

## 2019-12-18 ENCOUNTER — Ambulatory Visit: Payer: Medicare HMO | Admitting: Critical Care Medicine

## 2019-12-18 ENCOUNTER — Ambulatory Visit (HOSPITAL_COMMUNITY): Payer: Medicare HMO

## 2019-12-18 DIAGNOSIS — I330 Acute and subacute infective endocarditis: Secondary | ICD-10-CM | POA: Diagnosis not present

## 2019-12-18 NOTE — Progress Notes (Deleted)
Subjective:    Patient ID: Courtney [name], female    DOB: [birth year], 74 y.o.   MRN: [id number]  73 y.o.F [place] who is here to establish for primary care.  The patient was recently hospitalized between 24 October and fifth November for complex illness.  She had diabetes type 2 which was pre-existing and paroxysmal atrial fibrillation with tachybradycardia syndrome with previous pacemaker placement.  She presented with fever and confusion and found to have diabetic ketoacidosis and subsequently had bacteremia identified with Klebsiella and associated multifocal pneumonia along with liver abscess and valvular endocarditis.  Below is the discharge summary  Admit date: 10/06/2019 Discharge date: 10/18/2019  Admitted From: Home Disposition: Home  Recommendations for Outpatient Follow-up:  1. Follow up with PCP in 1 week  2. Outpatient follow-up with ID, IR and cardiology 3. Follow-up with IR for drain care 4. Follow up in ED if symptoms worsen or new appear   Home Health: RN Equipment/Devices: None  Discharge Condition: Stable CODE STATUS: Full Diet recommendation: Heart healthy/carb modified  Brief/Interim Summary: 74 year old female with history of diabetes mellitus type 2, paroxysmal A. fib and tachycardia/bradycardia syndrome status post PPM presented with fever, confusion. On presentation, she was found to have DKA which was treated with insulin drip and subsequently changed to long-acting insulin. During the hospitalization, she was found to have sepsis from Klebsiella bacteremia resulting in multifocal pneumonia, liver abscess along with valvular endocarditis. She underwent ultrasound-guided paracentesis and ultrasound-guided liver abscess aspiration on 10/10/2019. She subsequently underwent ultrasound guided drainage of hepatic abscess and drain placement by IR on 10/15/2019. ID has recommended Rocephin till 11/26/2019. CT surgery recommended continued antibiotic treatment  and no valve repair at this time.  She will be discharged home to complete intravenous antibiotics via PICC line.  Outpatient follow-up with ID/IR/cardiology.   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 She subsequently underwent ultrasound guided drainage of hepatic abscess and drain placement by IR on 10/15/2019.  -ID has recommended Rocephin till 11/26/2019 for a total of 6 weeks of antibiotics. CT surgery recommended continued antibiotic treatment and no valve repair at this time. -EP recommended medical therapy and surveillance cultures after therapy completed rather than lead removal now. -Outpatient follow-up with ID -Patient has a PICC line in place. -WBC still elevated but improving to 17.8 today.   Outpatient follow-up. -Outpatient follow-up with GI to arrange for colonoscopy, given liver abscess -Sepsis has resolved -Discharge patient home today as patient has remained hemodynamically stable.  Acute metabolic encephalopathy -Most likely from sepsis. Resolved  Transaminitis -Due to liver abscess. Improving.  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 Continue Eliquis and flecainide. Metoprolol on hold; will restart at a lower dose of 12.5 mg twice a day.  DKA: Resolved Diabetes mellitus type 2, uncontrolled with hyperglycemia -A1c 16.4. Continue carb modified diet. Continue Lantus along with insulin aspart with meals.  Outpatient follow-up.  Anemia of chronic disease--hemoglobin  stable.  ESBL positive and urine culture: Probably a colonizer as per ID.  Note the patient is on Rocephin and will continue for a 6-week total course.  She has follow-up visit with infectious disease.  There is a PICC line  in the right arm in place.  Note she does have a home care nurse that assists her.  Note the patient does need testing supplies for her glucose meter.  She also was not able to achieve the insulin Lantus as it was not covered under her insurance.  She also is out of her pain medicine and this is been an issue and that she has significant pain in the liver from the liver abscess.  Also there is a drain still in place that is managed by interventional radiology.  The patient has upcoming appointments with gastroenterology cardiology and infectious disease.  Note she has lost weight and does not have much appetite.  The patient is here with her grand son who provides interpretive services  Wt Readings from Last 3 Encounters: 11/12/19 : 95 lb (43.1 kg) 10/12/19 : 105 lb 2.6 oz (47.7 kg) 09/13/18 : 97 lb (44 kg)  Note the patient does smoke 1 cigar daily  12/18/2019 Endocarditis Klebsiella bacteremia with associated endocarditis with findings on transesophageal echo  Continue intravenous Rocephin length of therapy per infectious disease according to orders it is to continue through December 17  Tachy-brady syndrome [facility]) Tachybradycardia syndrome controlled with flecainide and has permanent pacemaker  We will refill the flecainide as she is out of this medication  Liver abscess Liver abscess that did reaccumulate after paracentesis therefore now has a drain in place per interventional radiology  Follow-up per gastroenterology and infectious disease  Uncontrolled type 2 diabetes mellitus ([place]) Uncontrolled type 2 diabetes now requiring insulin therapy  Plan will be to obtain for the patient Basaglar insulin at a dose of 20 units daily which she has yet to  receive and then to continue the NovoLog at 4 units 3 times daily with meals  Will obtain for the patient glucose testing supplies repeat labs today including CBC and complete metabolic panel urine for microalbumin  Note foot exam was normal today  Bacteremia due to Klebsiella pneumoniae, hypervirulent pathogen Bacteremia secondary to Klebsiella  Plan for this will be to continue antibiotics per infectious disease  Chronic anticoagulation On chronic Eliquis and have made refills available  Tobacco use disorder I counseled today with regards to tobacco use   Posey was seen today for hospitalization follow-up.  Diagnoses and all orders for this visit:  Bacteremia due to Klebsiella pneumoniae, hypervirulent pathogen -     DG Chest 2 View; Future -     CBC with Differential/Platelet; Future -     CBC with Differential/Platelet  Uncontrolled type 2 diabetes mellitus with hyperglycemia (HCC) -     Glucose (CBG) -     Comprehensive metabolic panel -     Microalbumin, urine  Acute bacterial endocarditis -     CBC with Differential/Platelet; Future -     CBC with Differential/Platelet  Chronic obstructive pulmonary disease, unspecified COPD type ([place])  Liver abscess -     CBC with Differential/Platelet; Future -     Comprehensive metabolic panel -     CBC with Differential/Platelet  Klebsiella infection -     DG Chest 2 View; Future  Chronic anticoagulation -     CBC with Differential/Platelet; Future -     CBC with Differential/Platelet  Pacemaker  Tobacco use disorder  Tachy-brady syndrome (HCC)  Other orders -     flecainide (TAMBOCOR) 50 MG tablet; Take 1 tablet (50 mg total) by mouth 2 (two) times daily. -     glucose monitoring kit (FREESTYLE) monitoring kit;  Check cbgs bid as instructed by pcp. -     insulin aspart (NOVOLOG) 100 UNIT/ML injection; Inject 4 Units into the skin 3 (three) times daily with meals. -     Insulin Syringe-Needle  U-100 (INSULIN SYRINGE .3CC/31GX5/16") 31G X 5/16" 0.3 ML MISC; Use with insulin glargine and novolog insulin -     metoprolol tartrate (LOPRESSOR) 25 MG tablet; Take 0.5 tablets (12.5 mg total) by mouth 2 (two) times daily. -     oxyCODONE (OXY IR/ROXICODONE) 5 MG immediate release tablet; Take 1 tablet (5 mg total) by mouth every 6 (six) hours as needed for severe pain. -     Insulin Glargine (BASAGLAR KWIKPEN) 100 UNIT/ML SOPN; Inject 0.2 mLs (20 Units total) into the skin daily. -     Lancets (FREESTYLE) lancets; Use as instructed   Pending CT Abd/Chest ? IR drain removal.  Still on augmentin.  Echo good, no more vegetations PPM wire ok     Past Medical History:  Diagnosis Date  . Allergy   . Atherosclerosis of aorta ([place]) 10/15/2019  . Diabetes mellitus type 2, diet-controlled ([place])   . PAF (paroxysmal atrial fibrillation) ([place]) 09/04/2017  . Sinus bradycardia 09/04/2017   HR 30s at times  . Tachy-brady syndrome ([place]) 09/04/2017     Family History  Problem Relation Age of Onset  . Diabetes Son      Social History   Socioeconomic History  . Marital status: Single    Spouse name: Not on file  . Number of children: Not on file  . Years of education: Not on file  . Highest education level: Not on file  Occupational History  . Not on file  Tobacco Use  . Smoking status: Current Every Day Smoker  . Smokeless tobacco: Never Used  Substance and Sexual Activity  . Alcohol use: No  . Drug use: No  . Sexual activity: Not Currently  Other Topics Concern  . Not on file  Social History Narrative  . Not on file   Social Determinants of Health   Financial Resource Strain:   . Difficulty of Paying Living Expenses: Not on file  Food Insecurity:   . Worried About Charity fundraiser in the Last Year: Not on file  . Ran Out of Food in the Last Year: Not on file  Transportation Needs:   . Lack of Transportation (Medical): Not on file  . Lack of Transportation (Non-Medical):  Not on file  Physical Activity:   . Days of Exercise per Week: Not on file  . Minutes of Exercise per Session: Not on file  Stress:   . Feeling of Stress : Not on file  Social Connections:   . Frequency of Communication with Friends and Family: Not on file  . Frequency of Social Gatherings with Friends and Family: Not on file  . Attends Religious Services: Not on file  . Active Member of Clubs or Organizations: Not on file  . Attends Archivist Meetings: Not on file  . Marital Status: Not on file  Intimate Partner Violence:   . Fear of Current or Ex-Partner: Not on file  . Emotionally Abused: Not on file  . Physically Abused: Not on file  . Sexually Abused: Not on file     No Known Allergies   Outpatient Medications Prior to Visit  Medication Sig Dispense Refill  . amoxicillin-clavulanate (AUGMENTIN) 875-125 MG tablet Take 1 tablet by mouth 2 (two) times daily. 60 tablet 0  .  apixaban (ELIQUIS) 5 MG TABS tablet Take 1 tablet (5 mg total) by mouth 2 (two) times daily. 180 tablet 1  . flecainide (TAMBOCOR) 50 MG tablet Take 1 tablet (50 mg total) by mouth 2 (two) times daily. 180 tablet 0  . insulin aspart (NOVOLOG) 100 UNIT/ML injection Inject 4 Units into the skin 3 (three) times daily with meals. 10 mL 0  . Insulin Glargine (BASAGLAR KWIKPEN) 100 UNIT/ML SOPN Inject 0.2 mLs (20 Units total) into the skin daily. 15 mL 4  . Insulin Syringe-Needle U-100 (INSULIN SYRINGE .3CC/31GX5/16") 31G X 5/16" 0.3 ML MISC Use with insulin glargine and novolog insulin 100 each 2  . megestrol (MEGACE) 20 MG tablet Take 1 tablet (20 mg total) by mouth daily. 30 tablet 2  . metoprolol tartrate (LOPRESSOR) 25 MG tablet Take 0.5 tablets (12.5 mg total) by mouth 2 (two) times daily. 60 tablet 1  . oxyCODONE (OXY IR/ROXICODONE) 5 MG immediate release tablet Take 1 tablet (5 mg total) by mouth every 6 (six) hours as needed for severe pain. (Patient not taking: Reported on 12/05/2019) 14 tablet 0    No facility-administered medications prior to visit.      Review of Systems Constitutional:     weight loss, night sweats,  Fevers, chills, fatigue, lassitude. HEENT:   No headaches,  Difficulty swallowing,  Tooth/dental problems,  Sore throat,                No sneezing, itching, ear ache, nasal congestion, post nasal drip,   CV:  No chest pain,  Orthopnea, PND, swelling in lower extremities, anasarca, dizziness, palpitations  GI   heartburn, indigestion, abdominal pain, nausea, vomiting, diarrhea, change in bowel habits, loss of appetite  Resp: No shortness of breath with exertion or at rest.  No excess mucus, no productive cough,  No non-productive cough,  No coughing up of blood.  No change in color of mucus.  No wheezing.  No chest wall deformity  Skin: no rash or lesions.  GU: no dysuria, change in color of urine, no urgency or frequency.  No flank pain.  MS:  No joint pain or swelling.  No decreased range of motion.  No back pain.  Psych:  No change in mood or affect. No depression or anxiety.  No memory loss.     Objective:   Physical Exam  There were no vitals filed for this visit.  Gen: Pleasant, thin in no distress,  normal affect  ENT: No lesions,  mouth clear,  oropharynx clear, no postnasal drip  Neck: No JVD, no TMG, no carotid bruits  Lungs: No use of accessory muscles, no dullness to percussion, clear without rales or rhonchi  Cardiovascular: RRR, heart sounds normal, no murmur or gallops, no peripheral edema, there is a PICC line in the right upper arm site is clean and dressed properly  Abdomen: soft  no HSM,  BS normal, there is a right upper quadrant drain going into a Jackson-Pratt device with cloudy material seen in the drain, right upper quadrant is quite tender to palpation without rebound  Musculoskeletal: No deformities, no cyanosis or clubbing  Neuro: alert, non focal  Skin: Warm, no lesions or rashes  All labs and imaging study from  recent hospitalization are reviewed in the [facility] system 11/1: CT ABD IMPRESSION: 1. Interval re-accumulation of an irregular rim enhancing fluid collection in the right lobe of the liver, compatible with an abscess measuring 3.5 x 3.3 x 3.0 cm. 2. Additional smaller  probable abscess in the right lobe of the liver, as described above. 3. Changes compatible with parenchymal infection in the right lobe of the liver with linear extension medially. 4. Moderate-sized bilateral pleural effusions, increased. 5. Progressive compressive atelectasis in both lower lobes. 6. Bilateral subcutaneous edema.   10/26 cxr IMPRESSION: Slight interval worsening bilateral patchy airspace process likely multifocal infection.  BMP Latest Ref Rng & Units 11/20/2019 11/12/2019 10/18/2019  Glucose 65 - 99 mg/dL 149(H) 152(H) 115(H)  BUN 7 - 25 mg/dL 19 20 7(L)  Creatinine 0.60 - 0.93 mg/dL 0.63 0.76 0.46  BUN/Creat Ratio 6 - 22 (calc) NOT APPLICABLE 26 -  Sodium 135 - 146 mmol/L 136 138 135  Potassium 3.5 - 5.3 mmol/L 4.3 4.2 3.6  Chloride 98 - 110 mmol/L 102 102 104  CO2 20 - 32 mmol/L _0 Calcium 8.6 - 10.4 mg/dL 9.5 9.4 7.7(L)   Hepatic Function Latest Ref Rng & Units 11/20/2019 11/12/2019 10/18/2019  Total Protein 6.1 - 8.1 g/dL 7.2 7.3 6.1(L)  Albumin 3.7 - 4.7 g/dL - 3.9 1.7(L)  AST 10 - 35 U/L _1 ALT 6 - 29 U/L 17 13 68(H)  Alk Phosphatase 39 - 117 IU/L - 133(H) 104  Total Bilirubin 0.2 - 1.2 mg/dL 0.3 <0.2 0.5  Bilirubin, Direct 0.0 - 0.2 mg/dL - - -   CBC Latest Ref Rng & Units 11/20/2019 11/12/2019 10/18/2019  WBC 3.8 - 10.8 Thousand/uL 9.5 6.1 17.8(H)  Hemoglobin 11.7 - 15.5 g/dL 11.5(L) 10.5(L) 8.5(L)  Hematocrit 35.0 - 45.0 % 35.4 32.3(L) 26.0(L)  Platelets 140 - 400 Thousand/uL 383 420 389      Assessment & Plan:  I personally reviewed all images and lab data in the [facility] system as well as any outside material available during this office visit and agree with the  radiology  impressions.   No problem-specific Assessment & Plan notes found for this encounter.   There are no diagnoses linked to this encounter.

## 2019-12-19 ENCOUNTER — Encounter: Payer: Self-pay | Admitting: Critical Care Medicine

## 2019-12-19 ENCOUNTER — Other Ambulatory Visit: Payer: Self-pay

## 2019-12-19 ENCOUNTER — Ambulatory Visit: Payer: Medicare HMO | Attending: Critical Care Medicine | Admitting: Critical Care Medicine

## 2019-12-19 VITALS — BP 121/75 | HR 61 | Resp 16 | Wt 100.6 lb

## 2019-12-19 DIAGNOSIS — I495 Sick sinus syndrome: Secondary | ICD-10-CM

## 2019-12-19 DIAGNOSIS — E1165 Type 2 diabetes mellitus with hyperglycemia: Secondary | ICD-10-CM

## 2019-12-19 DIAGNOSIS — K750 Abscess of liver: Secondary | ICD-10-CM | POA: Diagnosis not present

## 2019-12-19 DIAGNOSIS — R7881 Bacteremia: Secondary | ICD-10-CM

## 2019-12-19 DIAGNOSIS — B961 Klebsiella pneumoniae [K. pneumoniae] as the cause of diseases classified elsewhere: Secondary | ICD-10-CM | POA: Diagnosis not present

## 2019-12-19 DIAGNOSIS — F17210 Nicotine dependence, cigarettes, uncomplicated: Secondary | ICD-10-CM

## 2019-12-19 DIAGNOSIS — J449 Chronic obstructive pulmonary disease, unspecified: Secondary | ICD-10-CM

## 2019-12-19 DIAGNOSIS — I700 Atherosclerosis of aorta: Secondary | ICD-10-CM | POA: Diagnosis not present

## 2019-12-19 DIAGNOSIS — F17200 Nicotine dependence, unspecified, uncomplicated: Secondary | ICD-10-CM

## 2019-12-19 DIAGNOSIS — I482 Chronic atrial fibrillation, unspecified: Secondary | ICD-10-CM | POA: Diagnosis not present

## 2019-12-19 DIAGNOSIS — R69 Illness, unspecified: Secondary | ICD-10-CM | POA: Diagnosis not present

## 2019-12-19 LAB — GLUCOSE, POCT (MANUAL RESULT ENTRY): POC Glucose: 386 mg/dl — AB (ref 70–99)

## 2019-12-19 NOTE — Progress Notes (Addendum)
Subjective:    Patient ID: Courtney [name], female    DOB: [birth year], 74 y.o.   MRN: [id number]  73 y.o.F [place] who is here to establish for primary care.  The patient was recently hospitalized between 24 October and fifth November for complex illness.  She had diabetes type 2 which was pre-existing and paroxysmal atrial fibrillation with tachybradycardia syndrome with previous pacemaker placement.  She presented with fever and confusion and found to have diabetic ketoacidosis and subsequently had bacteremia identified with Klebsiella and associated multifocal pneumonia along with liver abscess and valvular endocarditis.  Below is the discharge summary  Admit date: 10/06/2019 Discharge date: 10/18/2019 Brief/Interim Summary: 74 year old female with history of diabetes mellitus type 2, paroxysmal A. fib and tachycardia/bradycardia syndrome status post PPM presented with fever, confusion. On presentation, she was found to have DKA which was treated with insulin drip and subsequently changed to long-acting insulin. During the hospitalization, she was found to have sepsis from Klebsiella bacteremia resulting in multifocal pneumonia, liver abscess along with valvular endocarditis. She underwent ultrasound-guided paracentesis and ultrasound-guided liver abscess aspiration on 10/10/2019. She subsequently underwent ultrasound guided drainage of hepatic abscess and drain placement by IR on 10/15/2019. ID has recommended Rocephin till 11/26/2019. CT surgery recommended continued antibiotic treatment and no valve repair at this time.  She will be discharged home to complete intravenous antibiotics via PICC line.  Outpatient follow-up with ID/IR/cardiology.   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 She subsequently underwent ultrasound guided drainage of hepatic abscess and drain placement by IR on 10/15/2019.  -ID has recommended Rocephin till 11/26/2019 for a total of 6 weeks of antibiotics. CT surgery recommended continued antibiotic treatment and no valve repair at this time. -EP recommended medical therapy and surveillance cultures after therapy completed rather than lead removal now. -Outpatient follow-up with ID -Patient has a PICC line in place. -WBC still elevated but improving to 17.8 today.   Outpatient follow-up. -Outpatient follow-up with GI to arrange for colonoscopy, given liver abscess -Sepsis has resolved -Discharge patient home today as patient has remained hemodynamically stable.  Acute metabolic encephalopathy -Most likely from sepsis. Resolved  Transaminitis -Due to liver abscess. Improving.  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 Continue Eliquis and flecainide. Metoprolol on hold; will restart at a lower dose of 12.5 mg twice a day.  DKA: Resolved Diabetes mellitus type 2, uncontrolled with hyperglycemia -A1c 16.4. Continue carb modified diet. Continue Lantus along with insulin aspart with meals.  Outpatient follow-up.  Anemia of chronic disease--hemoglobin stable.  ESBL positive and urine culture: Probably a colonizer as per ID.  Note the patient is on Rocephin and will continue for a 6-week total course.  She has follow-up visit with infectious disease.  There is a PICC line in the right arm in place.  Note she does have a home care nurse that assists her.  Note the patient does need testing supplies for her glucose meter.  She also was not able to  achieve the insulin Lantus as it was not covered under her insurance.  She also is out of her pain medicine and this is been an issue and that she  has significant pain in the liver from the liver abscess.  Also there is a drain still in place that is managed by interventional radiology.  The patient has upcoming appointments with gastroenterology cardiology and infectious disease.  Note she has lost weight and does not have much appetite.  The patient is here with her grand son who provides interpretive services  Wt Readings from Last 3 Encounters: 11/12/19 : 95 lb (43.1 kg) 10/12/19 : 105 lb 2.6 oz (47.7 kg) 09/13/18 : 97 lb (44 kg)  Note the patient does smoke 1 cigar daily  12/19/2019 This is a follow-up visit for this 74 year old [place] female who is accompanied by her grandson Courtney [name] who was the interpreter for this visit.  The patient has slowly been recovering from endocarditis and liver abscess with associated pneumonia from a sensitive Klebsiella.  Recent echocardiogram is shown clearance of vegetations.  No recurrent positivity on blood cultures.  Liver abscess appears to be improving with no drainage from the Jackson-Pratt that is been placed.  No reaccumulation of the ascites has been seen.  She has a CT of the chest and abdomen pending in 24 hours which based on this the drain will likely be discontinued.  The pacemaker wires were assessed and are not infected and she has a chronic pacemaker because of tachybradycardia syndrome.  She is on the flecainide as well at this time.  She is now on oral antibiotics and off intravenous antibiotics and PICC line has been removed  We are managing her diabetes type 2 and recent blood sugars have been in improved range although this morning she comes in in the 380s it appears she missed her a.m. NovoLog dose and ate breakfast The patient has occasional dizziness and continued pain around the drain site.  She is dyspneic if she walks long distances.   She is taking 1 Glucerna supplement a day. The patient smoking less than 1 cigar daily.    Past Medical History:  Diagnosis Date  . Allergy   . Atherosclerosis of aorta ([place]) 10/15/2019  . Diabetes mellitus type 2, diet-controlled ([place])   . PAF (paroxysmal atrial fibrillation) ([place]) 09/04/2017  . Sinus bradycardia 09/04/2017   HR 30s at times  . Tachy-brady syndrome ([place]) 09/04/2017     Family History  Problem Relation Age of Onset  . Diabetes Son      Social History   Socioeconomic History  . Marital status: Single    Spouse name: Not on file  . Number of children: Not on file  . Years of education: Not on file  . Highest education level: Not on file  Occupational History  . Not on file  Tobacco Use  . Smoking status: Current Every Day Smoker  . Smokeless tobacco: Never Used  Substance and Sexual Activity  . Alcohol use: No  . Drug use: No  . Sexual activity: Not Currently  Other Topics Concern  . Not on file  Social History Narrative  . Not on file   Social Determinants of Health   Financial Resource Strain:   . Difficulty of Paying Living Expenses: Not on file  Food Insecurity:   . Worried About Charity fundraiser in the Last Year: Not on file  . Ran Out of Food in the Last Year: Not on file  Transportation Needs:   . Lack of Transportation (Medical): Not on file  . Lack of Transportation (Non-Medical): Not on file  Physical Activity:   . Days of Exercise per  Week: Not on file  . Minutes of Exercise per Session: Not on file  Stress:   . Feeling of Stress : Not on file  Social Connections:   . Frequency of Communication with Friends and Family: Not on file  . Frequency of Social Gatherings with Friends and Family: Not on file  . Attends Religious Services: Not on file  . Active Member of Clubs or Organizations: Not on file  . Attends Archivist Meetings: Not on file  . Marital Status: Not on file  Intimate Partner Violence:   . Fear of  Current or Ex-Partner: Not on file  . Emotionally Abused: Not on file  . Physically Abused: Not on file  . Sexually Abused: Not on file     No Known Allergies   Outpatient Medications Prior to Visit  Medication Sig Dispense Refill  . amoxicillin-clavulanate (AUGMENTIN) 875-125 MG tablet Take 1 tablet by mouth 2 (two) times daily. 60 tablet 0  . apixaban (ELIQUIS) 5 MG TABS tablet Take 1 tablet (5 mg total) by mouth 2 (two) times daily. 180 tablet 1  . flecainide (TAMBOCOR) 50 MG tablet Take 1 tablet (50 mg total) by mouth 2 (two) times daily. 180 tablet 0  . insulin aspart (NOVOLOG) 100 UNIT/ML injection Inject 4 Units into the skin 3 (three) times daily with meals. 10 mL 0  . Insulin Glargine (BASAGLAR KWIKPEN) 100 UNIT/ML SOPN Inject 0.2 mLs (20 Units total) into the skin daily. 15 mL 4  . Insulin Syringe-Needle U-100 (INSULIN SYRINGE .3CC/31GX5/16") 31G X 5/16" 0.3 ML MISC Use with insulin glargine and novolog insulin 100 each 2  . megestrol (MEGACE) 20 MG tablet Take 1 tablet (20 mg total) by mouth daily. 30 tablet 2  . metoprolol tartrate (LOPRESSOR) 25 MG tablet Take 0.5 tablets (12.5 mg total) by mouth 2 (two) times daily. 60 tablet 1  . oxyCODONE (OXY IR/ROXICODONE) 5 MG immediate release tablet Take 1 tablet (5 mg total) by mouth every 6 (six) hours as needed for severe pain. (Patient not taking: Reported on 12/05/2019) 14 tablet 0   No facility-administered medications prior to visit.      Review of Systems Constitutional:     weight loss, night sweats,  Fevers, chills, fatigue, lassitude. HEENT:   No headaches,  Difficulty swallowing,  Tooth/dental problems,  Sore throat,                No sneezing, itching, ear ache, nasal congestion, post nasal drip,   CV:  No chest pain,  Orthopnea, PND, swelling in lower extremities, anasarca, dizziness, palpitations  GI   heartburn, indigestion, abdominal pain, nausea, vomiting, diarrhea, change in bowel habits, loss of  appetite  Resp: No shortness of breath with exertion or at rest.  No excess mucus, no productive cough,  No non-productive cough,  No coughing up of blood.  No change in color of mucus.  No wheezing.  No chest wall deformity  Skin: no rash or lesions.  GU: no dysuria, change in color of urine, no urgency or frequency.  No flank pain.  MS:  No joint pain or swelling.  No decreased range of motion.  No back pain.  Psych:  No change in mood or affect. No depression or anxiety.  No memory loss.     Objective:   Physical Exam  There were no vitals filed for this visit.  Gen: Pleasant, thin in no distress,  normal affect  ENT: No lesions,  mouth clear,  oropharynx clear, no postnasal drip  Neck: No JVD, no TMG, no carotid bruits  Lungs: No use of accessory muscles, no dullness to percussion, clear without rales or rhonchi  Cardiovascular: RRR, heart sounds normal, no murmur or gallops, no peripheral edema, there is a PICC line in the right upper arm site is clean and dressed properly  Abdomen: soft  no HSM,  BS normal, there is a right upper quadrant drain going into a Jackson-Pratt device with cloudy material seen in the drain, right upper quadrant is quite tender to palpation without rebound  Musculoskeletal: No deformities, no cyanosis or clubbing  Neuro: alert, non focal  Skin: Warm, no lesions or rashes FOOT EXAM NORMAL  All labs and imaging study from recent hospitalization are reviewed in the [facility] system 11/1: CT ABD IMPRESSION: 1. Interval re-accumulation of an irregular rim enhancing fluid collection in the right lobe of the liver, compatible with an abscess measuring 3.5 x 3.3 x 3.0 cm. 2. Additional smaller probable abscess in the right lobe of the liver, as described above. 3. Changes compatible with parenchymal infection in the right lobe of the liver with linear extension medially. 4. Moderate-sized bilateral pleural effusions, increased. 5. Progressive  compressive atelectasis in both lower lobes. 6. Bilateral subcutaneous edema.   10/26 cxr IMPRESSION: Slight interval worsening bilateral patchy airspace process likely multifocal infection.  BMP Latest Ref Rng & Units 11/20/2019 11/12/2019 10/18/2019  Glucose 65 - 99 mg/dL 149(H) 152(H) 115(H)  BUN 7 - 25 mg/dL 19 20 7(L)  Creatinine 0.60 - 0.93 mg/dL 0.63 0.76 0.46  BUN/Creat Ratio 6 - 22 (calc) NOT APPLICABLE 26 -  Sodium 135 - 146 mmol/L 136 138 135  Potassium 3.5 - 5.3 mmol/L 4.3 4.2 3.6  Chloride 98 - 110 mmol/L 102 102 104  CO2 20 - 32 mmol/L '23 22 24  '$ Calcium 8.6 - 10.4 mg/dL 9.5 9.4 7.7(L)   Hepatic Function Latest Ref Rng & Units 11/20/2019 11/12/2019 10/18/2019  Total Protein 6.1 - 8.1 g/dL 7.2 7.3 6.1(L)  Albumin 3.7 - 4.7 g/dL - 3.9 1.7(L)  AST 10 - 35 U/L '24 18 31  '$ ALT 6 - 29 U/L 17 13 68(H)  Alk Phosphatase 39 - 117 IU/L - 133(H) 104  Total Bilirubin 0.2 - 1.2 mg/dL 0.3 <0.2 0.5  Bilirubin, Direct 0.0 - 0.2 mg/dL - - -   CBC Latest Ref Rng & Units 11/20/2019 11/12/2019 10/18/2019  WBC 3.8 - 10.8 Thousand/uL 9.5 6.1 17.8(H)  Hemoglobin 11.7 - 15.5 g/dL 11.5(L) 10.5(L) 8.5(L)  Hematocrit 35.0 - 45.0 % 35.4 32.3(L) 26.0(L)  Platelets 140 - 400 Thousand/uL 383 420 389      Assessment & Plan:  I personally reviewed all images and lab data in the [facility] system as well as any outside material available during this office visit and agree with the  radiology impressions.   No problem-specific Assessment & Plan notes found for this encounter.   There are no diagnoses linked to this encounter.

## 2019-12-19 NOTE — Assessment & Plan Note (Signed)
Bacteremia has resolved and will follow up time course of treatment per antibiotics per infectious disease

## 2019-12-19 NOTE — Patient Instructions (Signed)
Hemoglobin A1c will be obtained today  No change in current medications  Keep your CT scan appointments as ordered

## 2019-12-19 NOTE — Assessment & Plan Note (Signed)
Chronic atrial fibrillation and for this will maintain Eliquis

## 2019-12-19 NOTE — Assessment & Plan Note (Signed)
Tachybradycardia syndrome stable at this time we will continue flecainide and the pacemaker is in place

## 2019-12-19 NOTE — Assessment & Plan Note (Signed)
The patient appears to stop smoking cigars and I gave counseling in case she recurs in this effort

## 2019-12-19 NOTE — Assessment & Plan Note (Signed)
Diabetes type 2 with hemoglobin A1c greater than 11 previously will recheck today with improved glycemic control maintain Lantus and NovoLog as prescribed

## 2019-12-19 NOTE — Assessment & Plan Note (Signed)
Liver abscess with improvement and decreased drainage from the Jackson-Pratt drain  Plan is for the patient have CT scan of the abdomen and based on this the drain will likely be pulled.  Duration of oral antibiotics is per infectious disease

## 2019-12-20 ENCOUNTER — Ambulatory Visit (HOSPITAL_COMMUNITY): Payer: Medicare HMO

## 2019-12-20 LAB — HEMOGLOBIN A1C
Est. average glucose Bld gHb Est-mCnc: 200 mg/dL
Hgb A1c MFr Bld: 8.6 % — ABNORMAL HIGH (ref 4.8–5.6)

## 2019-12-21 ENCOUNTER — Other Ambulatory Visit: Payer: Self-pay

## 2019-12-21 ENCOUNTER — Other Ambulatory Visit (HOSPITAL_COMMUNITY): Payer: Self-pay | Admitting: Internal Medicine

## 2019-12-21 ENCOUNTER — Ambulatory Visit (HOSPITAL_COMMUNITY)
Admission: RE | Admit: 2019-12-21 | Discharge: 2019-12-21 | Disposition: A | Discharge status: Home / Self Care | Payer: Medicare HMO | Source: Ambulatory Visit | Attending: Infectious Diseases | Admitting: Infectious Diseases

## 2019-12-21 ENCOUNTER — Ambulatory Visit (HOSPITAL_COMMUNITY)
Admission: RE | Admit: 2019-12-21 | Discharge: 2019-12-21 | Disposition: A | Discharge status: Home / Self Care | Payer: Medicare HMO | Source: Ambulatory Visit | Attending: Internal Medicine | Admitting: Internal Medicine

## 2019-12-21 DIAGNOSIS — R945 Abnormal results of liver function studies: Secondary | ICD-10-CM

## 2019-12-21 DIAGNOSIS — K750 Abscess of liver: Secondary | ICD-10-CM

## 2019-12-21 DIAGNOSIS — Z4803 Encounter for change or removal of drains: Secondary | ICD-10-CM | POA: Insufficient documentation

## 2019-12-21 HISTORY — PX: IR RADIOLOGIST EVAL & MGMT: IMG5224

## 2019-12-21 MED ORDER — IOHEXOL 300 MG/ML  SOLN
80.0000 mL | Freq: Once | INTRAMUSCULAR | Status: AC | PRN
  Administered 2019-12-21: 80 mL via INTRAVENOUS

## 2019-12-21 NOTE — Progress Notes (Signed)
Patient seen today at [facility] for follow up CT abd/pelvis with contrast which showed complete resolution of the right lobe hepatic abscess. Per patient there has been zero output from the drain for 3 weeks. No other concerns noted.   Drain was removed in tact today in IR without complication.   Please call IR with questions or concerns.   Lynnette Caffey, PA-C

## 2019-12-21 NOTE — Telephone Encounter (Signed)
Medical Assistant left message on patient's home and cell voicemail. Voicemail states to give a call back to Sekai Gitlin with [facility] at [phone].  

## 2019-12-21 NOTE — Telephone Encounter (Signed)
-----   Message from Storm Frisk, MD sent at 12/20/2019  6:56 AM EST ----- Let patients grandson Onalee Hua know his grandmothers A1C is down to 8.6 from >15, Stay on current medication doses for insulin as discussed yesterday

## 2020-01-01 ENCOUNTER — Ambulatory Visit (INDEPENDENT_AMBULATORY_CARE_PROVIDER_SITE_OTHER): Payer: Medicare HMO | Admitting: *Deleted

## 2020-01-01 ENCOUNTER — Encounter (HOSPITAL_COMMUNITY): Payer: Self-pay

## 2020-01-01 DIAGNOSIS — Z950 Presence of cardiac pacemaker: Secondary | ICD-10-CM | POA: Diagnosis not present

## 2020-01-02 LAB — CUP PACEART REMOTE DEVICE CHECK
Battery Remaining Longevity: 129 mo
Battery Remaining Percentage: 95.5 %
Battery Voltage: 3.01 V
Brady Statistic AP VP Percent: 1 %
Brady Statistic AP VS Percent: 68 %
Brady Statistic AS VP Percent: 1 %
Brady Statistic AS VS Percent: 31 %
Brady Statistic RA Percent Paced: 68 %
Brady Statistic RV Percent Paced: 1 %
Date Time Interrogation Session: 20210120015031
Implantable Lead Implant Date: 20180924
Implantable Lead Implant Date: 20180924
Implantable Lead Location: 753859
Implantable Lead Location: 753860
Implantable Lead Model: 1944
Implantable Lead Model: 1948
Implantable Pulse Generator Implant Date: 20180924
Lead Channel Impedance Value: 690 Ohm
Lead Channel Impedance Value: 940 Ohm
Lead Channel Pacing Threshold Amplitude: 0.5 V
Lead Channel Pacing Threshold Amplitude: 0.75 V
Lead Channel Pacing Threshold Pulse Width: 0.5 ms
Lead Channel Pacing Threshold Pulse Width: 0.5 ms
Lead Channel Sensing Intrinsic Amplitude: 12 mV
Lead Channel Sensing Intrinsic Amplitude: 4.1 mV
Lead Channel Setting Pacing Amplitude: 2 V
Lead Channel Setting Pacing Amplitude: 2.5 V
Lead Channel Setting Pacing Pulse Width: 0.5 ms
Lead Channel Setting Sensing Sensitivity: 2 mV
Pulse Gen Model: 2272
Pulse Gen Serial Number: 8940692

## 2020-01-06 NOTE — Progress Notes (Deleted)
Cardiology Office Note Date:  01/06/2020  Patient ID:  Courtney, [name] May 19, 1946, MRN [id number] PCP:  Courtney Frisk, MD  Cardiologist:  Dr. Duke [name] (hospital consult 2018) Electrophysiologist: Dr. Graciela [name]    Chief Complaint:  annual device visit, post-hospital, pre-op/colonoscopy    History of Present Illness: Courtney [name] is a 74 y.o. female with history of DM, tachy-brady w/PPM, AFib.  She comes in today to be seen for Dr. Graciela [name], last seen by him Sept 2019, at that time having some intermittent Afib symptomatic and planned for Afib clinic follow up possible 1c AAD after an echo.  Planned to update her labs including HgbAq1c and TSH.  Her BP was low and her metoprolol reduced.  More recently she was in [facility] admitted 10/06/2019 with altered mental status, found with DKA, and + Klebsieflla bacteremia, pneumonia, + large liver abscess and had TEE with questionable vegetation on the mitral valve/AV  TEE noted "In the setting of current bacteremia and implanted pacemaker, there do appear to be small filamentous strand-like material attached to the atrial surface of the mitral valve and just proximal to the aortic valve in the LVOT which could possibly represent vegetative material. This does not have the clinical appearance of the typical thick or shaggy vegetation. Nonetheless, consider treatment for possible endocarditis" Dr. Ladona [name] was consulted "The patient has Klebsiella bacteremia with possible involvement of the mitral and aortic valve but no veg seen on PM leads. I would recommend a full course of anti-biotic therapy as per the ID service followed by surviellance blood cultures. While this is thought to be a hypervirulent strain of Klebsiella, these germs usually do not stick to pacing/ICD leads. Obviously if she had treatment failure with recurrent bacteremia after a full course of anti-biotics, then extraction of the pacing system would be required."  She was last seen in-patient by Dr.  Duke [name], at that time was s/p liver asbcess drainage.  She was off her a/c with recent IR procedure, her metoprolol had been held (seems 2/2 hypotension early in the hospital stay) and recommended consideration for resumption in clinic. Planned to complete antibiotic course, no device extraction unless failed to clear blood cultures.cardiology signed off 10/18/2019. She was discharged 10/18/2019  Follow up with ID 11/20/2019 JP drain was being emptied about Q 3 days, reported 1/4-1/2 full Pt/family reported PICC line was removed 12/7, though family thought abx were to continue through 12/14. Pt c/o chest heaviness and SOB, symptoms of orthopnea requiring more pillows (and lueral effusions noted at her hospital stay)labs were improving  Started on PO Ciprofloxacin, planned for repeat CT, and follow up with IR drain clinic picc line was indeed removed early (unclear why) though not planned to replace and PO abx as noted pending her CT finding. BC reported cleared 10/11/2019 CP/SOB, ID NP noted "slightly decreased breath sounds overlying the RLL > LLL but overall has good airflow. I worry she may not have fully decompressed liver abscess with increased pressure on diaphragm" also ordered for an echo though this appears to have been cancelled.  Seems she is scheduled for screening colonoscopy and requiring cardiology evaluation/clearance as well as Eliquis recommendations pre-colonoscopy. GI mentioned MRI/MRCP would be optimal for surveillance/management of her gI issues, though unable with PPM   ID mentions she had lost 30lbs with her infection, hospitalization, post hospitalization, weighing 98lbs, her PMD prescribed Megace, though not yet started.  10/08/2019 CXR Slight interval worsening bilateral patchy airspace process likely multifocal infection.  10/14/2019 CT  abd/pelvis 4. Moderate-sized bilateral pleural effusions, increased. 5. Progressive compressive atelectasis in both lower  lobes.    I saw her 11/27/2019 was done with the aid of [employer] interpretor ([place] Courtney [name], [phone]#[id number] She was accompanied by her grandson She was doing well, mentions she had no cardiac concerns, fel like her heart is OK.  She remained with some heaviness to her breath, not overtly SOB at rest, will get winded with ambulation and mentioned that laying supine is harder for her to breathe as well.  Our elevator was broken and she walked up the 3 flights of stairs, her grandson reported she did fine, was "a little SOB at the top"  She agreed, says she was tired/out of breath once reaching the final steps, no CP. She c/o a R sided thorax pain, locates primary to her axilla, lateral thorax to R chest, this came and went, raising her arm up makes it worse, would provoke it.  She denied any difficulties with her ADLs No palpitations, no dizzy spells, near syncope or syncope. She denied any bleeding or signs of bleeding Her grandson confirmed accurate medicine list with the  MA today.  She needed clearance for colonoscopy planned for lat Jan-Feb, given some unclear, though suspect non-cardiac symptoms, CXR was planned and f/u visit after her drain removed to reassess her symptoms. CXR did not fine effusions or edema, improvement in prior LLL and RUL infiltrates, mild increase airspace disease LUL, RML, deferred to ID, planned no change in tx unless worsening/new pulm symptoms  She saw Dr. Luciana Axeomer 12/05/2019, repeat TTE without vegetations, planned to continue Augmentin 4 weeks, she was to keep f/u CT scan and drain clinic follow up (had missed prior appts).  12/21/2019 saw IR drain clinic, her f/u CT with resolved abscess, no drain output for days and drain was removed..   *** SOB?, CP? *** symptoms *** clear for colonoscopy? 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 Allergy   . Atherosclerosis of aorta (HCC) 10/15/2019  . Diabetes mellitus type 2, diet-controlled (HCC)   . PAF (paroxysmal atrial fibrillation) (HCC) 09/04/2017  . Sinus bradycardia 09/04/2017   HR 30s at times  . Tachy-brady syndrome (HCC) 09/04/2017    Past Surgical History:  Procedure Laterality Date  . IR GUIDED DRAIN W CATHETER PLACEMENT  10/15/2019  . IR PARACENTESIS  10/10/2019  . IR RADIOLOGIST EVAL & MGMT  12/21/2019  . IR US GUIDE BX ASP/DRAIN  10/10/2019  . PACEMAKER IMPLANT N/A 09/05/2017   Procedure: Pacemaker Implant;  Surgeon: Courtney SalviaKlein, Steven C, MD;  [facility];  Service: Cardiovascular;  Laterality: N/A;  . TEE WITHOUT CARDIOVERSION N/A 10/12/2019   Procedure: TRANSESOPHAGEAL ECHOCARDIOGRAM (TEE);  Surgeon: Jake BatheSkains, Mark C, MD;  [facility];  Service: Cardiovascular;  Laterality: N/A;    Current Outpatient Medications  Medication Sig Dispense Refill  . amoxicillin-clavulanate (AUGMENTIN) 875-125 MG tablet Take 1 tablet by mouth 2 (two) times daily. 60 tablet 0  . apixaban (ELIQUIS) 5 MG TABS tablet Take 1 tablet (5 mg total) by mouth 2 (two) times daily. 180 tablet 1  . BD VEO INSULIN SYRINGE U/F 31G X 15/64" 0.3 ML MISC LANTUS 20UNITS DAILY AND NOVOLOG 4 UNITS TID WITH MEALS    .  flecainide (TAMBOCOR) 50 MG tablet Take 1 tablet (50 mg total) by mouth 2 (two) times daily. 180 tablet 0  . insulin aspart (NOVOLOG) 100 UNIT/ML injection Inject 4 Units into the skin 3 (three) times daily with meals. 10 mL 0  . Insulin Glargine (BASAGLAR KWIKPEN) 100 UNIT/ML SOPN Inject 0.2 mLs (20 Units total) into the skin daily. 15 mL 4  . Insulin Syringe-Needle U-100 (INSULIN SYRINGE .3CC/31GX5/16") 31G X 5/16" 0.3 ML MISC Use with insulin glargine and novolog insulin 100 each 2  . metoprolol tartrate (LOPRESSOR) 25 MG tablet Take 0.5 tablets (12.5 mg total) by mouth 2  (two) times daily. 60 tablet 1  . oxyCODONE (OXY IR/ROXICODONE) 5 MG immediate release tablet Take 1 tablet (5 mg total) by mouth every 6 (six) hours as needed for severe pain. 14 tablet 0   No current facility-administered medications for this visit.    Allergies:   Patient has no known allergies.   Social History:  The patient  reports that she has quit smoking. She has never used smokeless tobacco. She reports that she does not drink alcohol or use drugs.   Family History:  The patient's family history includes Diabetes in her son.  ROS:  Please see the history of present illness.  All other systems are reviewed and otherwise negative.   PHYSICAL EXAM:  VS:  There were no vitals taken for this visit. BMI: There is no height or weight on file to calculate BMI. Well nourished, well developed, in no acute distress  HEENT: normocephalic, atraumatic  Neck: no JVD, carotid bruits or masses Cardiac:  *** RRR; no significant murmurs, no rubs, or gallops Lungs:  *** CTA b/l, no wheezing, rhonchi or rales  Abd: soft, nontender, *** R abd dressing/drain, is clean, dry. MS: no deformity, very petite, thin body habitus, age appropriate atrophy Ext: *** no edema  Skin: warm and dry, no rash Neuro:  No gross deficits appreciated Psych: euthymic mood, full affect  *** PPM site is stable, no tethering or discomfort   EKG:  Done at her last visit and reviewed by myself showed AP/VS, PR , QRS 65ms, QTc  PPM interrogation done today and reviewed by myself;  ***  IMPRESSIONS    1. Left ventricular ejection fraction, by visual estimation, is 60 to 65%. The left ventricle has normal function. Left ventricular septal wall thickness was normal. Normal left ventricular posterior wall thickness. There is no left ventricular  hypertrophy.  2. Elevated left ventricular end-diastolic pressure.  3. Left ventricular diastolic parameters are consistent with Grade II diastolic dysfunction  (pseudonormalization).  4. The left ventricle has no regional wall motion abnormalities.  5. Global right ventricle has normal systolic function.The right ventricular size is normal. No increase in right ventricular wall thickness.  6. Left atrial size was normal.  7. Right atrial size was normal.  8. The mitral valve is normal in structure. Trivial mitral valve regurgitation. No evidence of mitral stenosis.  9. The tricuspid valve is normal in structure. Tricuspid valve regurgitation is trivial. 10. The aortic valve is tricuspid. Aortic valve regurgitation is not visualized. No evidence of aortic valve sclerosis or stenosis. 11. The pulmonic valve was normal in structure. Pulmonic valve regurgitation is trivial. 12. Normal pulmonary artery systolic pressure. 13. A pacer wire is visualized. 14. The inferior vena cava is normal in size with greater than 50% respiratory variability, suggesting right atrial pressure of 3 mmHg.  FINDINGS  Left Ventricle: Left ventricular ejection fraction,  by visual estimation, is 60 to 65%. The left ventricle has normal function. The left ventricle has no regional wall motion abnormalities. Normal left ventricular posterior wall thickness. There is no  left ventricular hypertrophy. Left ventricular diastolic parameters are consistent with Grade II diastolic dysfunction (pseudonormalization). Elevated left ventricular end-diastolic pressure.  Right Ventricle: The right ventricular size is normal. No increase in right ventricular wall thickness. Global RV systolic function is has normal systolic function. The tricuspid regurgitant velocity is 1.95 m/s, and with an assumed right atrial pressure  of 3 mmHg, the estimated right ventricular systolic pressure is normal at 18.2 mmHg.  Left Atrium: Left atrial size was normal in size.  Right Atrium: Right atrial size was normal in size  Pericardium: There is no evidence of pericardial effusion.  Mitral Valve: The  mitral valve is normal in structure. Trivial mitral valve regurgitation. No evidence of mitral valve stenosis by observation. There is no evidence of mitral valve vegetation.  Tricuspid Valve: The tricuspid valve is normal in structure. Tricuspid valve regurgitation is trivial.  Aortic Valve: The aortic valve is tricuspid. Aortic valve regurgitation is not visualized. The aortic valve is structurally normal, with no evidence of sclerosis or stenosis.  Pulmonic Valve: The pulmonic valve was normal in structure. Pulmonic valve regurgitation is trivial. Pulmonic regurgitation is trivial.  Aorta: The aortic root, ascending aorta and aortic arch are all structurally normal, with no evidence of dilitation or obstruction.  Venous: The inferior vena cava is normal in size with greater than 50% respiratory variability, suggesting right atrial pressure of 3 mmHg.  IAS/Shunts: No atrial level shunt detected by color flow Doppler. There is no evidence of a patent foramen ovale. No ventricular septal defect is seen or detected. There is no evidence of an atrial septal defect.  Additional Comments: A pacer wire is visualized.     10/12/2019: TEE IMPRESSIONS 1. Left ventricular ejection fraction, by visual estimation, is 55 to 60%. The left ventricle has normal function. There is no left ventricular hypertrophy.  2. Global right ventricle has normal systolic function.The right ventricular size is normal. No increase in right ventricular wall thickness.  3. No vegetations on pacer wires.  4. Left atrial size was normal.  5. Right atrial size was normal.  6. Mild mitral valve prolapse.  7. The mitral valve is abnormal. Mild mitral valve regurgitation. No evidence of mitral stenosis.  8. No trisuspid valve vegetation visualized.  9. The tricuspid valve is normal in structure. Tricuspid valve regurgitation is mild. 10. The aortic valve is tricuspid. Aortic valve regurgitation is not  visualized. 11. No pulmonic vegetation present. 12. The pulmonic valve was grossly normal. Pulmonic valve regurgitation is not visualized. 13. Moderate plaque invoving the descending aorta. 14. A pacer wire is visualized in the RV and RA. 15. In the setting of current bacteremia and implanted pacemaker, there do appear to be small filamentous strand-like material attached to the atrial surface of the mitral valve and just proximal to the aortic valve in the LVOT which could possibly  represent vegetative material. This does not have the clinical appearance of the typical thick or shaggy vegetation. Nonetheless, consider treatment for possible endocarditis.  FINDINGS  Left Ventricle: Left ventricular ejection fraction, by visual estimation, is 55 to 60%. The left ventricle has normal function. There is no left ventricular hypertrophy.  Right Ventricle: The right ventricular size is normal. No increase in right ventricular wall thickness. Global RV systolic function is has normal systolic function. No  vegetations on pacer wires.  Left Atrium: Left atrial size was normal in size. No thrombus.  Right Atrium: Right atrial size was normal in size  Pericardium: There is no evidence of pericardial effusion. There is a small pleural effusion in the left lateral region.  Mitral Valve: The mitral valve is abnormal. There is mild holosystolic prolapse of of the mitral valve. No evidence of mitral valve stenosis by observation. Mild mitral valve regurgitation. There are faint, filamentous strands attached to the atrial  surface of the mitral valve leaflets, freely mobile, challenging to visualize. This may represent vegetative material versus degenerative material of mitral valve. They are not as robust as ruptured cords.  Tricuspid Valve: The tricuspid valve is normal in structure. Tricuspid valve regurgitation is mild. No TV vegetation was visualized.  Aortic Valve: The aortic valve is  tricuspid. Aortic valve regurgitation is not visualized.  Pulmonic Valve: The pulmonic valve was grossly normal. Pulmonic valve regurgitation is not visualized. No pulmonic vegetation present.  Aorta: The aortic root is normal in size and structure. There is moderate plaque involving the descending aorta. There are small filamentous strands attached to a segment of the left ventricular outflow tract approximately 5 mm below the aortic valve  leaflets that are freely mobile, possibly representing vegetative material.  Shunts: No atrial level shunt detected by color flow Doppler.  Additional Comments: In the setting of current bacteremia and implanted pacemaker, there do appear to be small filamentous strand-like material attached to the atrial surface of the mitral valve and just proximal to the aortic valve in the LVOT which  could possibly represent vegetative material. This does not have the clinical appearance of the typical thick or shaggy vegetation. Nonetheless, consider treatment for possible endocarditis. A pacer wire is visualized in the right ventricle and right  atrium.   Recent Labs: 10/08/2019: TSH 0.894 10/18/2019: Magnesium 1.8 11/20/2019: ALT 17; BUN 19; Creat 0.63; Hemoglobin 11.5; Platelets 383; Potassium 4.3; Sodium 136  10/16/2019: Cholesterol 110; HDL 14; LDL Cholesterol 77; Total CHOL/HDL Ratio 7.9; Triglycerides 96; VLDL 19   CrCl cannot be calculated (Patient's most recent lab result is older than the maximum 21 days allowed.).   Wt Readings from Last 3 Encounters:  12/19/19 100 lb 9.6 oz (45.6 kg)  12/05/19 101 lb 12.8 oz (46.2 kg)  11/27/19 97 lb (44 kg)     Other studies reviewed: Additional studies/records reviewed today include: summarized above  ASSESSMENT AND PLAN:  1. PPM     *** Intact function, no programming changes made  2. Paroxysmal Afib     CHA2DS2Vasc is 3, on Eliquis, appropriately dosed     *** On flecainide and metoprolol     She  denies any bleeding or signs of bleeding  3. Recent sepsis, bacteremia, Klebsiella     Possible involvement of MV/AV, (TEE noted above), no clear involvement on leads     *** Recommended surveillance blood cultures once ABX tx completed     ***       4. CP, SOB     *** CP does not sound of cardiac etiology, suspect 2/2 drain, abscess, musculoskeletal     ***   5. Pre-colonoscopy eval     ***     Disposition: ***  Current medicines are reviewed at length with the patient today.  The patient did not have any concerns regarding medicines.  Venetia Night, PA-C 01/06/2020 7:59 AM     [facility] [address]  [place] [address] [phone] (office)  [phone] (fax)

## 2020-01-08 ENCOUNTER — Encounter: Payer: Medicare HMO | Admitting: Physician Assistant

## 2020-01-14 ENCOUNTER — Ambulatory Visit: Payer: Medicare HMO | Admitting: Internal Medicine

## 2020-01-17 DIAGNOSIS — I330 Acute and subacute infective endocarditis: Secondary | ICD-10-CM | POA: Diagnosis not present

## 2020-01-26 ENCOUNTER — Other Ambulatory Visit: Payer: Self-pay | Admitting: Critical Care Medicine

## 2020-02-12 NOTE — Telephone Encounter (Signed)
A message was left in both [place] in voicemail for patient to call back to schedule OV.

## 2020-02-12 NOTE — Telephone Encounter (Signed)
Can you please call pt and schedule OV with Dr. Meridee Score to discuss scheduling colonoscopy.

## 2020-02-19 ENCOUNTER — Other Ambulatory Visit: Payer: Self-pay | Admitting: *Deleted

## 2020-02-19 ENCOUNTER — Other Ambulatory Visit: Payer: Self-pay

## 2020-02-19 MED ORDER — METOPROLOL TARTRATE 25 MG PO TABS: 12.5000 mg | ORAL_TABLET | Freq: Two times a day (BID) | ORAL | 2 refills | Status: DC

## 2020-02-19 MED ORDER — APIXABAN 5 MG PO TABS: 5.0000 mg | ORAL_TABLET | Freq: Two times a day (BID) | ORAL | 1 refills | Status: DC

## 2020-02-19 NOTE — Telephone Encounter (Signed)
Prescription refill request for Eliquis received.  Last office visit: Keitha Butte, 11/27/2019 Scr: 0.63, 11/20/2019 Age: 74 y.o. Weight: 45.6   Prescription refill request sent.

## 2020-04-01 ENCOUNTER — Ambulatory Visit (INDEPENDENT_AMBULATORY_CARE_PROVIDER_SITE_OTHER): Payer: Medicare HMO | Admitting: *Deleted

## 2020-04-01 DIAGNOSIS — Z950 Presence of cardiac pacemaker: Secondary | ICD-10-CM

## 2020-04-01 LAB — CUP PACEART REMOTE DEVICE CHECK
Battery Remaining Longevity: 125 mo
Battery Remaining Percentage: 95.5 %
Battery Voltage: 3.01 V
Brady Statistic AP VP Percent: 1 %
Brady Statistic AP VS Percent: 72 %
Brady Statistic AS VP Percent: 1 %
Brady Statistic AS VS Percent: 27 %
Brady Statistic RA Percent Paced: 72 %
Brady Statistic RV Percent Paced: 1 %
Date Time Interrogation Session: 20210420055251
Implantable Lead Implant Date: 20180924
Implantable Lead Implant Date: 20180924
Implantable Lead Location: 753859
Implantable Lead Location: 753860
Implantable Lead Model: 1944
Implantable Lead Model: 1948
Implantable Pulse Generator Implant Date: 20180924
Lead Channel Impedance Value: 680 Ohm
Lead Channel Impedance Value: 750 Ohm
Lead Channel Pacing Threshold Amplitude: 0.5 V
Lead Channel Pacing Threshold Amplitude: 0.75 V
Lead Channel Pacing Threshold Pulse Width: 0.5 ms
Lead Channel Pacing Threshold Pulse Width: 0.5 ms
Lead Channel Sensing Intrinsic Amplitude: 12 mV
Lead Channel Sensing Intrinsic Amplitude: 4.5 mV
Lead Channel Setting Pacing Amplitude: 2 V
Lead Channel Setting Pacing Amplitude: 2.5 V
Lead Channel Setting Pacing Pulse Width: 0.5 ms
Lead Channel Setting Sensing Sensitivity: 2 mV
Pulse Gen Model: 2272
Pulse Gen Serial Number: 8940692

## 2020-04-02 NOTE — Progress Notes (Signed)
PPM Remote  

## 2020-05-09 ENCOUNTER — Other Ambulatory Visit: Payer: Self-pay | Admitting: Critical Care Medicine

## 2020-05-24 IMAGING — CT CT ABD-PELV W/ CM
2 of 5 series · 16 of 46 positions shown, 18 images · IV contrast (Omni 300)
Comparison: 10/14/2019

CLINICAL DATA: Hepatic abscess and status post percutaneous
catheter drainage on 10/15/2019.

EXAM:
CT ABDOMEN AND PELVIS WITH CONTRAST
TECHNIQUE: Multidetector CT imaging of the abdomen and pelvis was performed
using the standard protocol following bolus administration of
intravenous contrast.
CONTRAST:  80mL OMNIPAQUE IOHEXOL 300 MG/ML  SOLN

[Series 3: a/p w/ 5mm · axial · 0.66mm/px · z∈[+606,+951]mm · 13 of 77 slices shown, 15 images]
[im 4/77  soft-tissue]
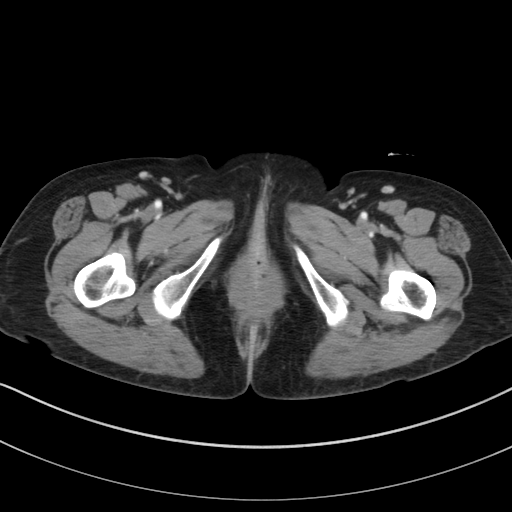
[im 4/77  bone]
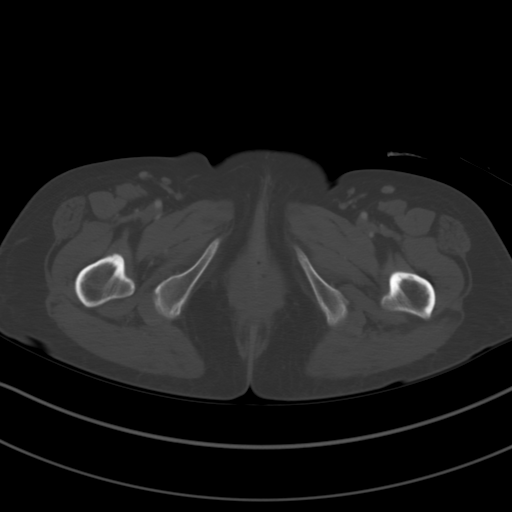
[im 12/77  soft-tissue]
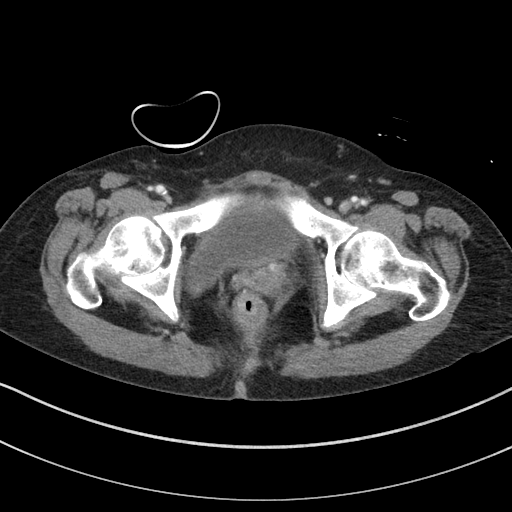
[im 16/77  soft-tissue]
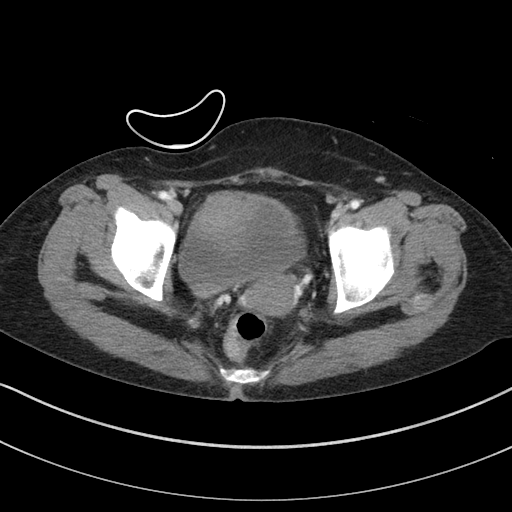
[im 23/77  soft-tissue]
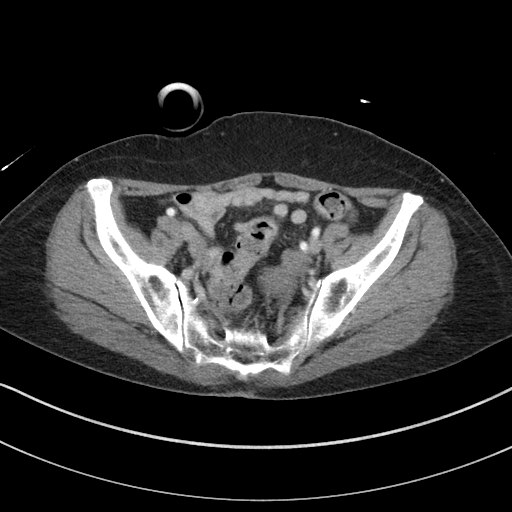
[im 27/77  soft-tissue]
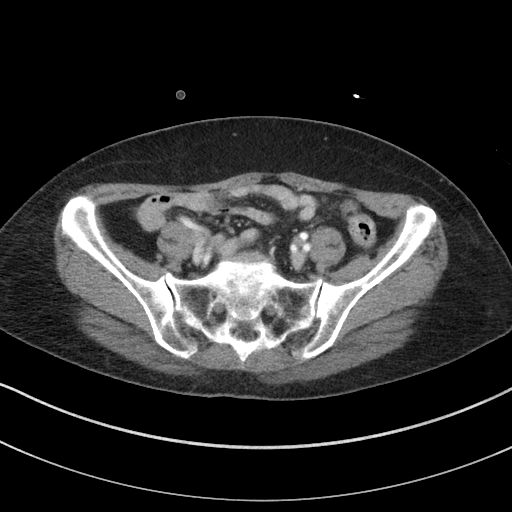
[im 35/77  soft-tissue]
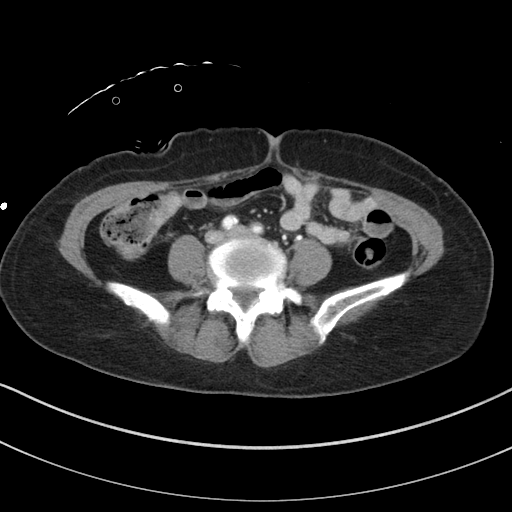
[im 39/77  soft-tissue]
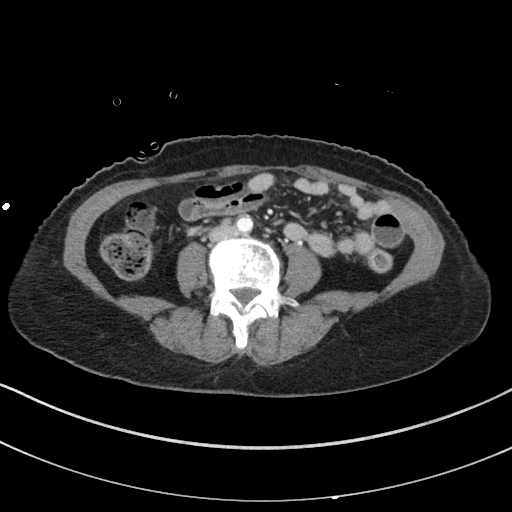
[im 42/77  soft-tissue]
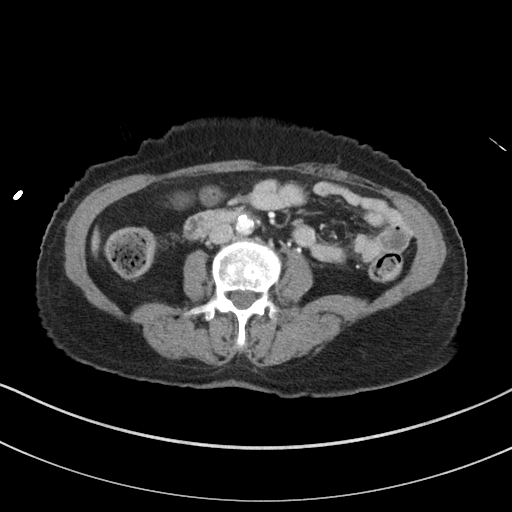
[im 50/77  soft-tissue]
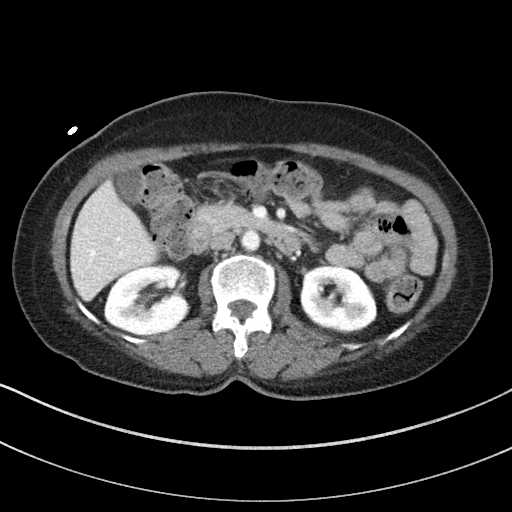
[im 50/77  bone]
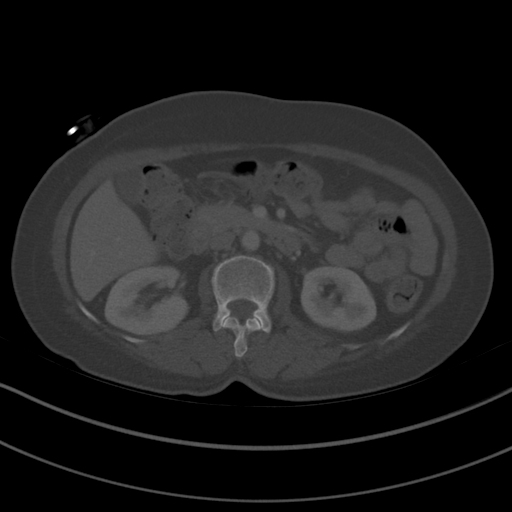
[im 54/77  soft-tissue]
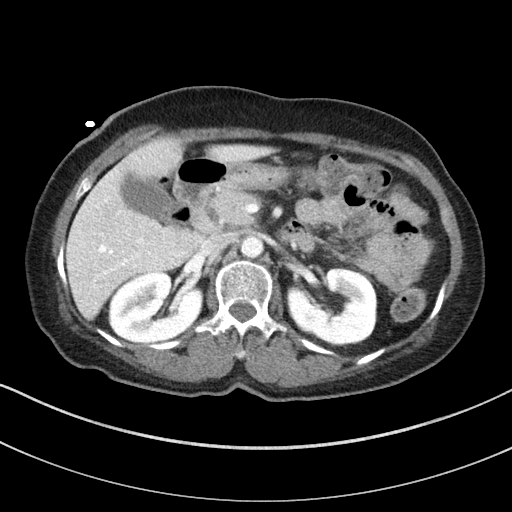
[im 61/77  soft-tissue]
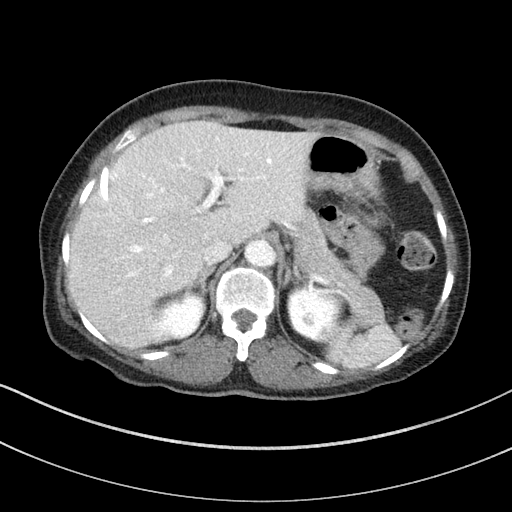
[im 65/77  soft-tissue]
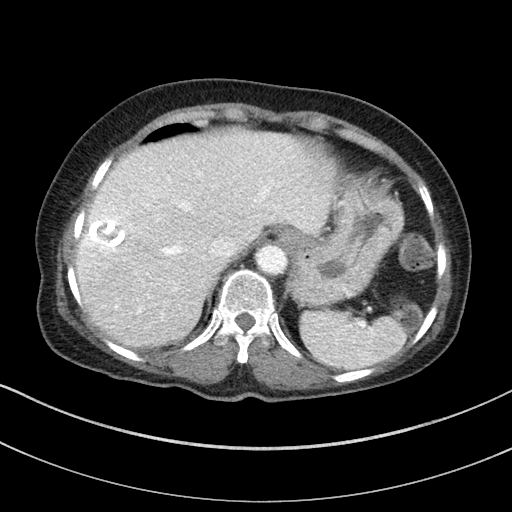
[im 73/77  soft-tissue]
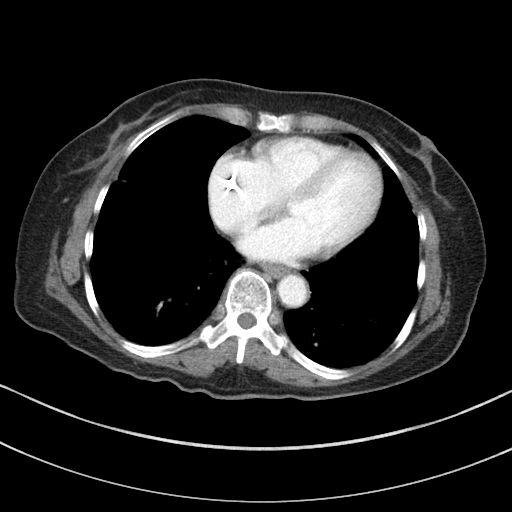

[Series 6: a/p w/ cor · coronal · 0.62mm/px · 3 of 113 slices shown]
[im 38/113  soft-tissue]
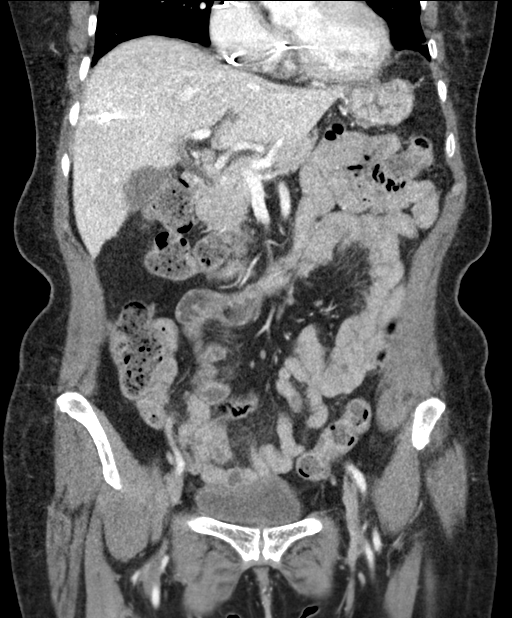
[im 50/113  soft-tissue]
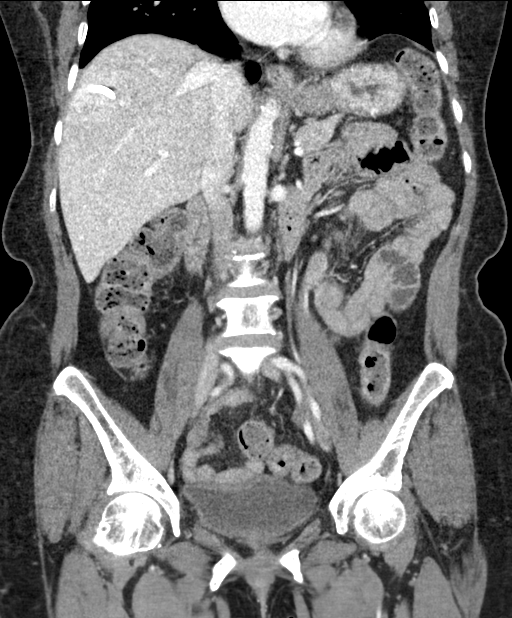
[im 63/113  soft-tissue]
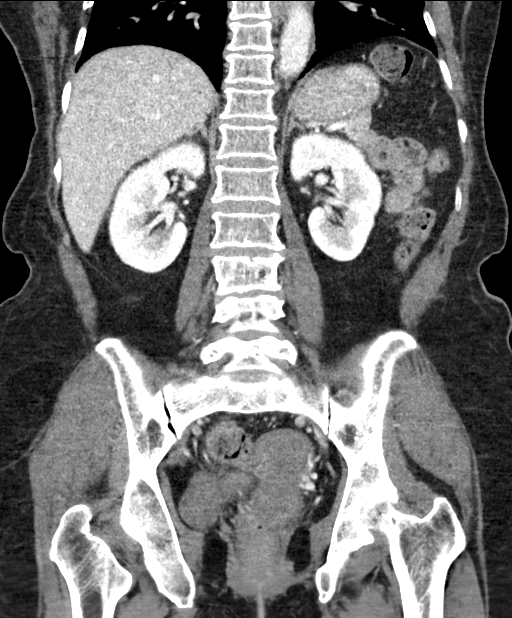

[16 of 46 positions shown; findings below may reference images not displayed]

FINDINGS: Lower chest: No acute abnormality.

Hepatobiliary: Percutaneous drainage catheter in place within the
upper right lobe. There is complete resolution the right lobe
hepatic abscess identified previously. No additional hepatic
abscess. No evidence of biliary ductal dilatation. The gallbladder
is normal.

Pancreas: Unremarkable. No pancreatic ductal dilatation or
surrounding inflammatory changes.

Spleen: Normal in size without focal abnormality.

Adrenals/Urinary Tract: Adrenal glands are unremarkable. Kidneys are
normal, without renal calculi, focal lesion, or hydronephrosis.
Bladder is unremarkable.

Stomach/Bowel: Bowel shows no evidence of obstruction, ileus or
inflammation. No free air identified.

Vascular/Lymphatic: No significant vascular findings are present. No
enlarged abdominal or pelvic lymph nodes.

Reproductive: Uterus and bilateral adnexa are unremarkable.

Other: No abdominal wall hernia or abnormality. No abdominopelvic
ascites.

Musculoskeletal: No acute or significant osseous findings.
IMPRESSION: Complete resolution of right lobe hepatic abscess after percutaneous
drainage.

## 2020-08-25 ENCOUNTER — Other Ambulatory Visit: Payer: Self-pay | Admitting: Critical Care Medicine

## 2020-08-25 NOTE — Telephone Encounter (Signed)
Requested medication (s) are due for refill today:  Yes  Requested medication (s) are on the active medication list:  Yes  Future visit scheduled:  No  Last Refill: 05/09/20; #180/ no refills  Note to clinic: Medication is not assigned to protocol.  Please review.   Requested Prescriptions  Pending Prescriptions Disp Refills   flecainide (TAMBOCOR) 50 MG tablet [Pharmacy Med Name: FLECAINIDE ACETATE 50 MG TAB] 180 tablet 0    Sig: TAKE 1 TABLET BY MOUTH TWICE A DAY      Off-Protocol Failed - 08/25/2020  1:50 AM      Failed - Medication not assigned to a protocol, review manually.      Passed - Valid encounter within last 12 months    Recent Outpatient Visits           8 months ago Uncontrolled type 2 diabetes mellitus with hyperglycemia [facility])   [facility] Storm Frisk, MD   9 months ago Bacteremia due to Klebsiella pneumoniae, hypervirulent pathogen   [facility] Storm Frisk, MD   4 years ago COPD exacerbation [facility])   [facility], Levell July, MD   5 years ago LLQ abdominal mass   [facility], Kenyon Ana, MD   6 years ago Rapid heart beat   [facility], Levell July, MD

## 2020-09-30 ENCOUNTER — Ambulatory Visit (INDEPENDENT_AMBULATORY_CARE_PROVIDER_SITE_OTHER): Payer: Medicare HMO

## 2020-09-30 DIAGNOSIS — I495 Sick sinus syndrome: Secondary | ICD-10-CM

## 2020-09-30 LAB — CUP PACEART REMOTE DEVICE CHECK
Battery Remaining Longevity: 124 mo
Battery Remaining Percentage: 95.5 %
Battery Voltage: 2.99 V
Brady Statistic AP VP Percent: 1 %
Brady Statistic AP VS Percent: 84 %
Brady Statistic AS VP Percent: 1 %
Brady Statistic AS VS Percent: 16 %
Brady Statistic RA Percent Paced: 83 %
Brady Statistic RV Percent Paced: 1 %
Date Time Interrogation Session: 20211019031611
Implantable Lead Implant Date: 20180924
Implantable Lead Implant Date: 20180924
Implantable Lead Location: 753859
Implantable Lead Location: 753860
Implantable Lead Model: 1944
Implantable Lead Model: 1948
Implantable Pulse Generator Implant Date: 20180924
Lead Channel Impedance Value: 660 Ohm
Lead Channel Impedance Value: 760 Ohm
Lead Channel Pacing Threshold Amplitude: 0.5 V
Lead Channel Pacing Threshold Amplitude: 0.75 V
Lead Channel Pacing Threshold Pulse Width: 0.5 ms
Lead Channel Pacing Threshold Pulse Width: 0.5 ms
Lead Channel Sensing Intrinsic Amplitude: 12 mV
Lead Channel Sensing Intrinsic Amplitude: 3.9 mV
Lead Channel Setting Pacing Amplitude: 2 V
Lead Channel Setting Pacing Amplitude: 2.5 V
Lead Channel Setting Pacing Pulse Width: 0.5 ms
Lead Channel Setting Sensing Sensitivity: 2 mV
Pulse Gen Model: 2272
Pulse Gen Serial Number: 8940692

## 2020-10-06 ENCOUNTER — Telehealth: Payer: Self-pay

## 2020-10-06 NOTE — Progress Notes (Signed)
Remote pacemaker transmission.   

## 2020-10-06 NOTE — Telephone Encounter (Signed)
ATC via [employer] [place], [id number]) for needed follow-up scheduling w/ PCP, no answer, LM to RC.

## 2020-10-09 NOTE — Telephone Encounter (Signed)
ATC pt x2 at phone # on file, LM to [facility], generated UTC letter and mailed to address on file

## 2020-11-27 ENCOUNTER — Other Ambulatory Visit: Payer: Self-pay | Admitting: Pharmacist

## 2020-11-27 MED ORDER — APIXABAN 5 MG PO TABS: 5.0000 mg | ORAL_TABLET | Freq: Two times a day (BID) | ORAL | 0 refills | Status: DC

## 2020-11-27 NOTE — Progress Notes (Signed)
Received fax from [facility] for Eliquis refill. Pt has not been seen in office since 11/27/19, does not have follow up visit scheduled. Also needs annual BMET and CBC checked to ensure she remains on correct Eliquis dose. No labs in [facility] or Care Everywhere. SCr has been consistently < 1.5 on all prior lab checks. Will send in 1 month refill with msg about scheduling appt, and will also send message to scheduling team to reach out to pt to schedule annual f/u.

## 2020-11-29 ENCOUNTER — Other Ambulatory Visit: Payer: Self-pay | Admitting: Critical Care Medicine

## 2020-11-29 ENCOUNTER — Other Ambulatory Visit: Payer: Self-pay | Admitting: Internal Medicine

## 2020-11-29 NOTE — Telephone Encounter (Signed)
Requested medication (s) are due for refill today: yes  Requested medication (s) are on the active medication list: yes  Last refill:  08/26/20  Future visit scheduled: no  Notes to clinic:  med not assigned to a protocol   Requested Prescriptions  Pending Prescriptions Disp Refills   flecainide (TAMBOCOR) 50 MG tablet [Pharmacy Med Name: FLECAINIDE ACETATE 50 MG TAB] 180 tablet 0    Sig: TAKE 1 TABLET BY MOUTH TWICE A DAY      Off-Protocol Failed - 11/29/2020  9:17 AM      Failed - Medication not assigned to a protocol, review manually.      Passed - Valid encounter within last 12 months    Recent Outpatient Visits           11 months ago Uncontrolled type 2 diabetes mellitus with hyperglycemia [facility])   [facility] Storm Frisk, MD   1 year ago Bacteremia due to Klebsiella pneumoniae, hypervirulent pathogen   [facility] Storm Frisk, MD   4 years ago COPD exacerbation [facility])   [facility], Levell July, MD   5 years ago LLQ abdominal mass   [facility], Kenyon Ana, MD   6 years ago Rapid heart beat   [facility] at [facility], Levell July, MD

## 2020-12-30 ENCOUNTER — Ambulatory Visit (INDEPENDENT_AMBULATORY_CARE_PROVIDER_SITE_OTHER): Payer: Medicare HMO

## 2020-12-30 DIAGNOSIS — I495 Sick sinus syndrome: Secondary | ICD-10-CM | POA: Diagnosis not present

## 2021-01-01 LAB — CUP PACEART REMOTE DEVICE CHECK
Battery Remaining Longevity: 122 mo
Battery Remaining Percentage: 95.5 %
Battery Voltage: 2.98 V
Brady Statistic AP VP Percent: 1 %
Brady Statistic AP VS Percent: 86 %
Brady Statistic AS VP Percent: 1 %
Brady Statistic AS VS Percent: 13 %
Brady Statistic RA Percent Paced: 85 %
Brady Statistic RV Percent Paced: 1 %
Date Time Interrogation Session: 20220119010613
Implantable Lead Implant Date: 20180924
Implantable Lead Implant Date: 20180924
Implantable Lead Location: 753859
Implantable Lead Location: 753860
Implantable Lead Model: 1944
Implantable Lead Model: 1948
Implantable Pulse Generator Implant Date: 20180924
Lead Channel Impedance Value: 660 Ohm
Lead Channel Impedance Value: 700 Ohm
Lead Channel Pacing Threshold Amplitude: 0.5 V
Lead Channel Pacing Threshold Amplitude: 0.75 V
Lead Channel Pacing Threshold Pulse Width: 0.5 ms
Lead Channel Pacing Threshold Pulse Width: 0.5 ms
Lead Channel Sensing Intrinsic Amplitude: 12 mV
Lead Channel Sensing Intrinsic Amplitude: 4.1 mV
Lead Channel Setting Pacing Amplitude: 2 V
Lead Channel Setting Pacing Amplitude: 2.5 V
Lead Channel Setting Pacing Pulse Width: 0.5 ms
Lead Channel Setting Sensing Sensitivity: 2 mV
Pulse Gen Model: 2272
Pulse Gen Serial Number: 8940692

## 2021-01-10 ENCOUNTER — Other Ambulatory Visit: Payer: Self-pay | Admitting: Critical Care Medicine

## 2021-01-10 NOTE — Telephone Encounter (Signed)
Requested medication (s) are due for refill today: yes  Requested medication (s) are on the active medication list: yes  Last refill:  08/26/20  Future visit scheduled: no  Notes to clinic:  med not assigned to a protocol   Requested Prescriptions  Pending Prescriptions Disp Refills   flecainide (TAMBOCOR) 50 MG tablet [Pharmacy Med Name: FLECAINIDE ACETATE 50 MG TAB] 180 tablet 0    Sig: TAKE 1 TABLET BY MOUTH TWICE A DAY      Off-Protocol Failed - 01/10/2021  9:23 AM      Failed - Medication not assigned to a protocol, review manually.      Failed - Valid encounter within last 12 months    Recent Outpatient Visits           1 year ago Uncontrolled type 2 diabetes mellitus with hyperglycemia [facility])   [facility] Storm Frisk, MD   1 year ago Bacteremia due to Klebsiella pneumoniae, hypervirulent pathogen   [facility] Storm Frisk, MD   4 years ago COPD exacerbation [facility])   [facility], Levell July, MD   5 years ago LLQ abdominal mass   [facility], Kenyon Ana, MD   7 years ago Rapid heart beat   Primary Care at [facility], Levell July, MD

## 2021-01-13 NOTE — Progress Notes (Signed)
Remote pacemaker transmission.   

## 2021-01-23 ENCOUNTER — Other Ambulatory Visit: Payer: Self-pay | Admitting: Internal Medicine

## 2021-01-23 NOTE — Telephone Encounter (Signed)
Pt last saw Francis Dowse, [place] on 11/27/19, pt is overdue for follow-up. Sent note to schedulers to make follow-up appt. Last labs 11/20/19 Creat 0.63, overdue for labwork as well. Age 75, weight 45.6kg.  Will await scheduled follow-up to refill rx.

## 2021-02-16 NOTE — Telephone Encounter (Addendum)
Called pt without success; called the interpreter line to have them call and leave a message in her primary language to call us to get scheduled for a Cardiologist Appt. Advised interpreter to leave message stating that the pt needs an appt with Cardiologist to refill her prescription and to call back at [phone] to get scheduled. The interpreter called and the voicemail came on and he left a message while I was on the other line.

## 2021-02-24 ENCOUNTER — Other Ambulatory Visit: Payer: Self-pay | Admitting: Critical Care Medicine

## 2021-02-24 ENCOUNTER — Other Ambulatory Visit: Payer: Self-pay

## 2021-02-24 ENCOUNTER — Ambulatory Visit: Payer: Medicare HMO | Attending: Critical Care Medicine | Admitting: Critical Care Medicine

## 2021-02-24 ENCOUNTER — Encounter: Payer: Self-pay | Admitting: Critical Care Medicine

## 2021-02-24 VITALS — BP 129/71 | HR 60 | Resp 16 | Wt 96.0 lb

## 2021-02-24 DIAGNOSIS — K750 Abscess of liver: Secondary | ICD-10-CM

## 2021-02-24 DIAGNOSIS — Z23 Encounter for immunization: Secondary | ICD-10-CM

## 2021-02-24 DIAGNOSIS — I700 Atherosclerosis of aorta: Secondary | ICD-10-CM | POA: Diagnosis not present

## 2021-02-24 DIAGNOSIS — E1165 Type 2 diabetes mellitus with hyperglycemia: Secondary | ICD-10-CM

## 2021-02-24 DIAGNOSIS — R1011 Right upper quadrant pain: Secondary | ICD-10-CM

## 2021-02-24 DIAGNOSIS — Z7901 Long term (current) use of anticoagulants: Secondary | ICD-10-CM

## 2021-02-24 DIAGNOSIS — F17200 Nicotine dependence, unspecified, uncomplicated: Secondary | ICD-10-CM

## 2021-02-24 DIAGNOSIS — R69 Illness, unspecified: Secondary | ICD-10-CM | POA: Diagnosis not present

## 2021-02-24 DIAGNOSIS — J449 Chronic obstructive pulmonary disease, unspecified: Secondary | ICD-10-CM

## 2021-02-24 DIAGNOSIS — I482 Chronic atrial fibrillation, unspecified: Secondary | ICD-10-CM

## 2021-02-24 LAB — GLUCOSE, POCT (MANUAL RESULT ENTRY)
POC Glucose: 532 mg/dl — AB (ref 70–99)
POC Glucose: 468 mg/dl — AB (ref 70–99)
POC Glucose: 530 mg/dl — AB (ref 70–99)

## 2021-02-24 LAB — POCT URINALYSIS DIP (CLINITEK)
Bilirubin, UA: NEGATIVE
Blood, UA: NEGATIVE
Glucose, UA: >=1000 mg/dL — AB
Leukocytes, UA: NEGATIVE
Nitrite, UA: NEGATIVE
POC PROTEIN,UA: NEGATIVE
Spec Grav, UA: 1.01 (ref 1.010–1.025)
Urobilinogen, UA: 0.2 E.U./dL
pH, UA: 5.5 (ref 5.0–8.0)

## 2021-02-24 LAB — POCT GLYCOSYLATED HEMOGLOBIN (HGB A1C): HbA1c POC (<> result, manual entry): >15 % (ref 4.0–5.6)

## 2021-02-24 MED ORDER — ATORVASTATIN CALCIUM 20 MG PO TABS: 20.0000 mg | ORAL_TABLET | Freq: Every day | ORAL | 3 refills | Status: DC

## 2021-02-24 MED ORDER — FLECAINIDE ACETATE 50 MG PO TABS: 50.0000 mg | ORAL_TABLET | Freq: Two times a day (BID) | ORAL | 1 refills | Status: DC

## 2021-02-24 MED ORDER — INSULIN ASPART 100 UNIT/ML ~~LOC~~ SOLN
10.0000 [IU] | Freq: Once | SUBCUTANEOUS | Status: AC
  Administered 2021-02-24: 10 [IU] via SUBCUTANEOUS

## 2021-02-24 MED ORDER — INSULIN PEN NEEDLE 29G X 5MM MISC: 3 refills | Status: DC

## 2021-02-24 MED ORDER — APIXABAN 5 MG PO TABS: 5.0000 mg | ORAL_TABLET | Freq: Two times a day (BID) | ORAL | 3 refills | Status: DC

## 2021-02-24 MED ORDER — INSULIN LISPRO (1 UNIT DIAL) 100 UNIT/ML (KWIKPEN): 5.0000 [IU] | PEN_INJECTOR | Freq: Three times a day (TID) | SUBCUTANEOUS | 2 refills | Status: DC

## 2021-02-24 MED ORDER — BASAGLAR KWIKPEN 100 UNIT/ML ~~LOC~~ SOPN: 30.0000 [IU] | PEN_INJECTOR | Freq: Every day | SUBCUTANEOUS | 4 refills | Status: DC

## 2021-02-24 MED ORDER — METOPROLOL TARTRATE 25 MG PO TABS: 12.5000 mg | ORAL_TABLET | Freq: Two times a day (BID) | ORAL | 6 refills | Status: DC

## 2021-02-24 MED ORDER — INSULIN ASPART 100 UNIT/ML ~~LOC~~ SOLN
20.0000 [IU] | Freq: Once | SUBCUTANEOUS | Status: AC
  Administered 2021-02-24: 20 [IU] via SUBCUTANEOUS

## 2021-02-24 MED ORDER — NOVOLOG FLEXPEN 100 UNIT/ML ~~LOC~~ SOPN: 5.0000 [IU] | PEN_INJECTOR | Freq: Three times a day (TID) | SUBCUTANEOUS | 2 refills | Status: DC

## 2021-02-24 NOTE — Assessment & Plan Note (Signed)
Begin at atorvastatin check lipid panel

## 2021-02-24 NOTE — Assessment & Plan Note (Signed)
It was difficult to engage this patient due to language barrier around tobacco use at this visit

## 2021-02-24 NOTE — Assessment & Plan Note (Signed)
Chronic atrial fibrillation still present  We will resume Eliquis 5 mg twice daily  Continue flecainide as prescribed Continue metoprolol 12 and half milligrams twice daily Begin atorvastatin daily  Referral to cardiology made

## 2021-02-24 NOTE — Assessment & Plan Note (Signed)
Liver abscess appears to have resolved will check abdominal ultrasound

## 2021-02-24 NOTE — Telephone Encounter (Signed)
Pharmacy requesting alternative med "not on formulary, alternative requested"

## 2021-02-24 NOTE — Progress Notes (Signed)
Subjective:    Patient ID: Courtney [name], female    DOB: 1946/08/15, 75 y.o.   MRN: [id number]  75 y.o.F [place] who is here to establish for primary care.  11/12/19 The patient was recently hospitalized between 24 October and fifth November for complex illness.  She had diabetes type 2 which was pre-existing and paroxysmal atrial fibrillation with tachybradycardia syndrome with previous pacemaker placement.  She presented with fever and confusion and found to have diabetic ketoacidosis and subsequently had bacteremia identified with Klebsiella and associated multifocal pneumonia along with liver abscess and valvular endocarditis.  Below is the discharge summary  Admit date: 10/06/2019 Discharge date: 10/18/2019  Admitted From: Home Disposition: Home  Recommendations for Outpatient Follow-up:  1. Follow up with PCP in 1 week  2. Outpatient follow-up with ID, IR and cardiology 3. Follow-up with IR for drain care 4. Follow up in ED if symptoms worsen or new appear   Home Health: RN Equipment/Devices: None  Discharge Condition: Stable CODE STATUS: Full Diet recommendation: Heart healthy/carb modified  Brief/Interim Summary: 75 year old female with history of diabetes mellitus type 2, paroxysmal A. fib and tachycardia/bradycardia syndrome status post PPM presented with fever, confusion. On presentation, she was found to have DKA which was treated with insulin drip and subsequently changed to long-acting insulin. During the hospitalization, she was found to have sepsis from Klebsiella bacteremia resulting in multifocal pneumonia, liver abscess along with valvular endocarditis. She underwent ultrasound-guided paracentesis and ultrasound-guided liver abscess aspiration on 10/10/2019. She subsequently underwent ultrasound guided drainage of hepatic abscess and drain placement by IR on 10/15/2019. ID has recommended Rocephin till 11/26/2019. CT surgery recommended continued antibiotic  treatment and no valve repair at this time.  She will be discharged home to complete intravenous antibiotics via PICC line.  Outpatient follow-up with ID/IR/cardiology.   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 She subsequently underwent ultrasound guided drainage of hepatic abscess and drain placement by IR on 10/15/2019.  -ID has recommended Rocephin till 11/26/2019 for a total of 6 weeks of antibiotics. CT surgery recommended continued antibiotic treatment and no valve repair at this time. -EP recommended medical therapy and surveillance cultures after therapy completed rather than lead removal now. -Outpatient follow-up with ID -Patient has a PICC line in place. -WBC still elevated but improving to 17.8 today.   Outpatient follow-up. -Outpatient follow-up with GI to arrange for colonoscopy, given liver abscess -Sepsis has resolved -Discharge patient home today as patient has remained hemodynamically stable.  Acute metabolic encephalopathy -Most likely from sepsis. Resolved  Transaminitis -Due to liver abscess. Improving.  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 Continue Eliquis and flecainide. Metoprolol on hold; will restart at a lower dose of 12.5 mg twice a day.  DKA: Resolved Diabetes mellitus type 2, uncontrolled with hyperglycemia -A1c 16.4. Continue carb modified diet. Continue Lantus along with insulin aspart with meals.  Outpatient follow-up.  Anemia of chronic  disease--hemoglobin stable.  ESBL positive and urine culture: Probably a colonizer as per ID.  Note the patient is on Rocephin and will continue for a 6-week total course.  She has follow-up visit with infectious disease.  There is a PICC  line in the right arm in place.  Note she does have a home care nurse that assists her.  Note the patient does need testing supplies for her glucose meter.  She also was not able to achieve the insulin Lantus as it was not covered under her insurance.  She also is out of her pain medicine and this is been an issue and that she has significant pain in the liver from the liver abscess.  Also there is a drain still in place that is managed by interventional radiology.  The patient has upcoming appointments with gastroenterology cardiology and infectious disease.  Note she has lost weight and does not have much appetite.  The patient is here with her grand son who provides interpretive services  Wt Readings from Last 3 Encounters: 11/12/19 : 95 lb (43.1 kg) 10/12/19 : 105 lb 2.6 oz (47.7 kg) 09/13/18 : 97 lb (44 kg)  Note the patient does smoke 1 cigar daily  02/24/2021 This is a chronically ill-appearing [place] female accompanied by her son neither of whom speak [place].  Language barrier was assisted by Francee Nodal interpreter for [place]  The patient's not been seen in our clinic since January 2021 and when I review epic she is not been seen by any other providers either.  At the last visit she was recovering from Klebsiella bacteremia with endocarditis and liver abscess.  She had been followed by infectious disease and had a drainage catheter in her liver.  That has since been removed and she is off all antibiotics and infectious disease has signed off.  She is not been in to see her cardiologist either.  The assumption was there was oral location of her original infection.  She also had urinary source as well.  Since all of those acute illnesses  the patient has been at home and actually stopped taking all of her insulin.  She was still taking metoprolol and flecainide from cardiology.  She is not been in to see cardiology in nearly a year.  Note on arrival hemoglobin A1c was greater than 15 blood sugar was greater than 500  Patient has multiple somatic complaints including that of right upper quadrant abdominal pain, upper chest wall pain, shortness of breath, palpitations, excess urination.  Note the patient had been on Eliquis but she stopped this as well.  She uses a hookah type pipe system to smoke tobacco leaves through her [place] culture. 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 with findings on transesophageal echo  Continue intravenous Rocephin length of therapy per infectious disease according to orders it is to continue through December 17  Tachy-brady syndrome [facility]) Tachybradycardia syndrome controlled with flecainide and has permanent pacemaker  We will refill the flecainide as she is out of this medication  Liver abscess Liver abscess that did reaccumulate after paracentesis therefore now has a drain in place per interventional radiology  Follow-up per gastroenterology and infectious disease  Uncontrolled type 2 diabetes mellitus (HCC) Uncontrolled type 2 diabetes now requiring insulin therapy  Plan will be to obtain for the patient Basaglar insulin at a dose of 20 units daily which she has yet to receive and then to continue the NovoLog at 4 units 3 times daily with meals  Will obtain for the patient glucose testing supplies repeat labs today including CBC and complete metabolic panel urine for microalbumin  Note foot exam was normal today  Bacteremia due to Klebsiella pneumoniae, hypervirulent pathogen Bacteremia secondary to  Klebsiella  Plan for this will be to continue antibiotics per infectious disease  Chronic anticoagulation On chronic Eliquis and  have made refills available  Tobacco use disorder I counseled today with regards to tobacco use   Shaylen was seen today for hospitalization follow-up.  Diagnoses and all orders for this visit:  Bacteremia due to Klebsiella pneumoniae, hypervirulent pathogen -     DG Chest 2 View; Future -     CBC with Differential/Platelet; Future -     CBC with Differential/Platelet  Uncontrolled type 2 diabetes mellitus with hyperglycemia (HCC) -     Glucose (CBG) -     Comprehensive metabolic panel -     Microalbumin, urine  Acute bacterial endocarditis -     CBC with Differential/Platelet; Future -     CBC with Differential/Platelet  Chronic obstructive pulmonary disease, unspecified COPD type ([place])  Liver abscess -     CBC with Differential/Platelet; Future -     Comprehensive metabolic panel -     CBC with Differential/Platelet  Klebsiella infection -     DG Chest 2 View; Future  Chronic anticoagulation -     CBC with Differential/Platelet; Future -     CBC with Differential/Platelet  Pacemaker  Tobacco use disorder  Tachy-brady syndrome (HCC)  Other orders -     flecainide (TAMBOCOR) 50 MG tablet; Take 1 tablet (50 mg total) by mouth 2 (two) times daily. -     glucose monitoring kit (FREESTYLE) monitoring kit; Check cbgs bid as instructed by pcp. -     insulin aspart (NOVOLOG) 100 UNIT/ML injection; Inject 4 Units into the skin 3 (three) times daily with meals. -     Insulin Syringe-Needle U-100 (INSULIN SYRINGE .3CC/31GX5/16") 31G X 5/16" 0.3 ML MISC; Use with insulin glargine and novolog insulin -     metoprolol tartrate (LOPRESSOR) 25 MG tablet; Take 0.5 tablets (12.5 mg total) by mouth 2 (two) times daily. -     oxyCODONE (OXY IR/ROXICODONE) 5 MG immediate release tablet; Take 1 tablet (5 mg total) by mouth every 6 (six) hours as needed for severe pain. -     Insulin Glargine (BASAGLAR KWIKPEN) 100 UNIT/ML SOPN; Inject 0.2 mLs (20 Units total) into the skin daily. -      Lancets (FREESTYLE) lancets; Use as instructed -     glucose blood (FREESTYLE TEST STRIPS) test strip; Use as instructed     Past Medical History:  Diagnosis Date  . Allergy   . Atherosclerosis of aorta ([place]) 10/15/2019  . 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 Diabetes mellitus type 2, diet-controlled ([place])   . PAF (paroxysmal atrial fibrillation) ([place]) 09/04/2017  . Sinus bradycardia 09/04/2017   HR 30s at times  . Tachy-brady syndrome ([place]) 09/04/2017     Family History  Problem Relation Age of Onset  . Diabetes Son      Social History   Socioeconomic History  . Marital status: Single    Spouse name: Not on file  . Number of children: Not on file  . Years of education: Not on file  . Highest education level: Not on file  Occupational History  . Not on file  Tobacco Use  . Smoking status: Former Research scientist (life sciences)  . Smokeless tobacco: Never Used  Vaping Use  . Vaping Use: Never used  Substance and Sexual Activity  . Alcohol use: No  . Drug use: No  . Sexual activity: Not Currently  Other Topics Concern  .  Not on file  Social History Narrative  . Not on file   Social Determinants of Health   Financial Resource Strain: Not on file  Food Insecurity: Not on file  Transportation Needs: Not on file  Physical Activity: Not on file  Stress: Not on file  Social Connections: Not on file  Intimate Partner Violence: Not on file     No Known Allergies   Outpatient Medications Prior to Visit  Medication Sig Dispense Refill  . flecainide (TAMBOCOR) 50 MG tablet TAKE 1 TABLET BY MOUTH TWICE A DAY 180 tablet 0  . metoprolol tartrate (LOPRESSOR) 25 MG tablet Take 0.5 tablets (12.5 mg total) by mouth 2 (two) times daily. Please make overdue appt with Dr. Caryl Comes before anymore refills. Thank you 1st attempt 30 tablet 0  . amoxicillin-clavulanate (AUGMENTIN) 875-125 MG tablet Take 1 tablet by mouth 2 (two) times daily. (Patient not taking:  Reported on 02/24/2021) 60 tablet 0  . apixaban (ELIQUIS) 5 MG TABS tablet Take 1 tablet (5 mg total) by mouth 2 (two) times daily. Need MD appointment for refills (Patient not taking: Reported on 02/24/2021) 30 tablet 0  . BD VEO INSULIN SYRINGE U/F 31G X 15/64" 0.3 ML MISC LANTUS 20UNITS DAILY AND NOVOLOG 4 UNITS TID WITH MEALS (Patient not taking: Reported on 02/24/2021)    . Insulin Glargine (BASAGLAR KWIKPEN) 100 UNIT/ML SOPN Inject 0.2 mLs (20 Units total) into the skin daily. (Patient not taking: Reported on 02/24/2021) 15 mL 4  . Insulin Syringe-Needle U-100 (INSULIN SYRINGE .3CC/31GX5/16") 31G X 5/16" 0.3 ML MISC Use with insulin glargine and novolog insulin (Patient not taking: Reported on 02/24/2021) 100 each 2  . NOVOLOG 100 UNIT/ML injection INJECT 4 UNITS INTO THE SKIN 3 (THREE) TIMES DAILY WITH MEALS. (Patient not taking: Reported on 02/24/2021) 10 mL 0  . oxyCODONE (OXY IR/ROXICODONE) 5 MG immediate release tablet Take 1 tablet (5 mg total) by mouth every 6 (six) hours as needed for severe pain. (Patient not taking: Reported on 02/24/2021) 14 tablet 0   No facility-administered medications prior to visit.      Review of Systems  Constitutional: Positive for fatigue and fever.  HENT: Negative.   Respiratory: Positive for shortness of breath. Negative for cough.   Cardiovascular: Positive for chest pain. Negative for leg swelling.  Gastrointestinal: Positive for abdominal pain and constipation. Negative for blood in stool and diarrhea.  Endocrine: Positive for polyuria.  Genitourinary: Positive for dysuria and flank pain. Negative for difficulty urinating.  Musculoskeletal: Positive for back pain.  Neurological: Positive for dizziness.  Psychiatric/Behavioral: Negative.        Objective:   Physical Exam  Vitals:   02/24/21 0956  BP: 129/71  Pulse: 60  Resp: 16  SpO2: 99%  Weight: 96 lb (43.5 kg)    Gen: Pleasant, thin in no distress,  normal affect  ENT: No lesions,   mouth clear,  oropharynx clear, no postnasal drip, tobacco stained teeth with periodontal disease Neck: No JVD, no TMG, no carotid bruits  Lungs: No use of accessory muscles, no dullness to percussion, clear without rales or rhonchi  Cardiovascular: RRR, heart sounds normal, no murmur or gallops, no peripheral edema, pacemaker felt in the left upper chest  Abdomen: soft  no HSM,  BS normal, right upper quadrant is tender but liver is not enlarged  Musculoskeletal: No deformities, no cyanosis or clubbing  Neuro: alert, non focal  Skin: Warm, no lesions or rashes Foot exam was normal  BMP Latest  Ref Rng & Units 11/20/2019 11/12/2019 10/18/2019  Glucose 65 - 99 mg/dL 149(H) 152(H) 115(H)  BUN 7 - 25 mg/dL 19 20 7(L)  Creatinine 0.60 - 0.93 mg/dL 0.63 0.76 0.46  BUN/Creat Ratio 6 - 22 (calc) NOT APPLICABLE 26 -  Sodium 135 - 146 mmol/L 136 138 135  Potassium 3.5 - 5.3 mmol/L 4.3 4.2 3.6  Chloride 98 - 110 mmol/L 102 102 104  CO2 20 - 32 mmol/L $RemoveB'23 22 24  'PwDxlMNa$ Calcium 8.6 - 10.4 mg/dL 9.5 9.4 7.7(L)   Hepatic Function Latest Ref Rng & Units 11/20/2019 11/12/2019 10/18/2019  Total Protein 6.1 - 8.1 g/dL 7.2 7.3 6.1(L)  Albumin 3.7 - 4.7 g/dL - 3.9 1.7(L)  AST 10 - 35 U/L $Remo'24 18 31  'vVfva$ ALT 6 - 29 U/L 17 13 68(H)  Alk Phosphatase 39 - 117 IU/L - 133(H) 104  Total Bilirubin 0.2 - 1.2 mg/dL 0.3 <0.2 0.5  Bilirubin, Direct 0.0 - 0.2 mg/dL - - -   CBC Latest Ref Rng & Units 11/20/2019 11/12/2019 10/18/2019  WBC 3.8 - 10.8 Thousand/uL 9.5 6.1 17.8(H)  Hemoglobin 11.7 - 15.5 g/dL 11.5(L) 10.5(L) 8.5(L)  Hematocrit 35.0 - 45.0 % 35.4 32.3(L) 26.0(L)  Platelets 140 - 400 Thousand/uL 383 420 389      Assessment & Plan:  I personally reviewed all images and lab data in the [facility] system as well as any outside material available during this office visit and agree with the  radiology impressions.   Chronic atrial fibrillation (HCC) Chronic atrial fibrillation still present  We will resume Eliquis 5 mg  twice daily  Continue flecainide as prescribed Continue metoprolol 12 and half milligrams twice daily Begin atorvastatin daily  Referral to cardiology made    Chronic anticoagulation Resume Eliquis with CHA2DS2-VASc score of 2  Uncontrolled type 2 diabetes mellitus (HCC) A1c greater than 15 lack of follow-up lack of adherence to insulin products  We gave this patient multiple doses of short acting insulin at this visit her blood sugar was 468 at the end of this she was orally hydrated  Patient is to resume insulin glargine 30 units at bedtime insulin lispro 5 units 3 times a day with meals  Patient to return short-term follow-up in 1 week with clinical pharmacist and with Dr. Joya Gaskins in 4 weeks  Tobacco use disorder It was difficult to engage this patient due to language barrier around tobacco use at this visit  Atherosclerosis of aorta ([place]) Begin at atorvastatin check lipid panel  Chronic obstructive pulmonary disease ([place]) Distant breath sounds noted on no inhaled medicines  Liver abscess Liver abscess appears to have resolved will check abdominal ultrasound   Daya was seen today for diabetes and hypertension.  Diagnoses and all orders for this visit:  Uncontrolled type 2 diabetes mellitus with hyperglycemia ([place]) -     POCT glucose (manual entry) -     POCT glycosylated hemoglobin (Hb A1C) -     Microalbumin / creatinine urine ratio -     POCT URINALYSIS DIP (CLINITEK) -     insulin aspart (novoLOG) injection 10 Units -     Comprehensive metabolic panel -     CBC with Differential/Platelet -     Lipid panel -     POCT glucose (manual entry) -     insulin aspart (novoLOG) injection 20 Units -     POCT glucose (manual entry)  Influenza vaccine needed  Need for tetanus, diphtheria, and acellular pertussis (Tdap) vaccine  Liver  abscess -     US Abdomen Complete; Future  RUQ abdominal pain -     US Abdomen Complete; Future  Need for immunization against  influenza -     Flu Vaccine QUAD 36+ mos IM  Chronic atrial fibrillation (HCC)  Chronic anticoagulation  Tobacco use disorder  Chronic obstructive pulmonary disease, unspecified COPD type ([place])  Atherosclerosis of aorta ([place])  Other orders -     apixaban (ELIQUIS) 5 MG TABS tablet; Take 1 tablet (5 mg total) by mouth 2 (two) times daily. Need MD appointment for refills -     Insulin Glargine (BASAGLAR KWIKPEN) 100 UNIT/ML; Inject 30 Units into the skin at bedtime. -     insulin lispro (HUMALOG) 100 UNIT/ML KwikPen; Inject 5 Units into the skin 3 (three) times daily with meals. -     Insulin Pen Needle 29G X 5MM MISC; Use with insulin pens -     flecainide (TAMBOCOR) 50 MG tablet; Take 1 tablet (50 mg total) by mouth 2 (two) times daily. -     metoprolol tartrate (LOPRESSOR) 25 MG tablet; Take 0.5 tablets (12.5 mg total) by mouth 2 (two) times daily. Please make overdue appt with Dr. Caryl Comes before anymore refills. Thank you 1st attempt -     atorvastatin (LIPITOR) 20 MG tablet; Take 1 tablet (20 mg total) by mouth daily. -     Cancel: Flu Vaccine QUAD 36+ mos IM -     Pneumococcal conjugate vaccine 13-valent IM -     Tdap vaccine greater than or equal to 7yo IM  Multiple primary care vaccines given including Prevnar 13 Valent pneumococcal vaccine, and tetanus vaccine along with flu vaccine This visit lasted 75 minutes due to the fact this patient required multiple doses of insulin

## 2021-02-24 NOTE — Assessment & Plan Note (Signed)
Distant breath sounds noted on no inhaled medicines

## 2021-02-24 NOTE — Assessment & Plan Note (Signed)
Resume Eliquis with CHA2DS2-VASc score of 2

## 2021-02-24 NOTE — Patient Instructions (Addendum)
Resume insulin glargine 30 units at bedtime, a KwikPen device was given instructions given as to how to use this device  Resume insulin Humalog 5 units before each meal  Begin atorvastatin 1 pill daily for cholesterol  Continue flecainide as prescribed by Dr. Graciela Husbands  Continue metoprolol as prescribed by Dr. Graciela Husbands  Resume Eliquis 1 pill twice daily for blood thinning because of your arrhythmia  Referral back to Dr. Graciela Husbands will be made  Refills on all your medications sent [facility]  An ultrasound of the abdomen will be obtained  Labs today include metabolic profile blood count lipid panel  Please consider obtaining a Covid vaccine series below is where you can find a vaccine I recommend the Pfizer vaccine  We gave you all your other vaccines today in the office  Return to see Franky Macho our clinical pharmacist in 1 week and Dr. Delford Field in 4 weeks  Reduce your tobacco intake  Please drink plenty of fluids particularly water or electrolyte-containing water     B?t ??u atorvastatin 1 vin m?i ngy ??i v?i cholesterol  Ti?p t?c flecainide theo quy ??nh c?a Ti?n s? Graciela Husbands  Ti?p t?c metoprolol theo quy ??nh c?a Ti?n s? Klein  Ti?p t?c u?ng Eliquis 1 vin hai l?n m?i ngy ?? lm long mu do r?i lo?n nh?p tim c?a b?n  Gi?i Treniece?u tr? l?i Ti?n s? Graciela Husbands s? ???c th?c hi?n  N?p t?t c? cc lo?i thu?c ???c g?i ??n hi?u thu?c [facility] ??c?a b?n  Siu m vng b?ng s? ???c  Phng th nghi?m ngy nay bao g?m b?ng ?o lipid mu h? s? trao ??i ch?t  Vui lng xem xt mua m?t lo?t v?c xin Covid d??i ?y l n?i b?n c th? tm th?y lo?i v?c xin m ti [place] dng l v?c xin [employer] ti ? tim cho b?n t?t c? cc lo?i v?c xin khc c?a b?n hm nay t?i v?n phng  [place] l?i g?p Luke d??c s? lm sng c?a chng ti sau 1 tu?n v bc s? [place] sau 4 tu?n  Gi?m l??ng thu?c l c?a b?n  Vui lng u?ng nhi?u n??c, ??c bi?t l n??c l?c ho?c n??c c ch?t ?i?n gi?i         Pneumococcal  Conjugate Vaccine (PCV13): What You Need to Know 1. Why get vaccinated? Pneumococcal conjugate vaccine (PCV13) can prevent pneumococcal disease. Pneumococcal disease refers to any illness caused by pneumococcal bacteria. These bacteria can cause many types of illnesses, including pneumonia, which is an infection of the lungs. Pneumococcal bacteria are one of the most common causes of pneumonia. Besides pneumonia, pneumococcal bacteria can also cause:  Ear infections  Sinus infections  Meningitis (infection of the tissue covering the brain and spinal cord)  Bacteremia (infection of the blood) Anyone can get pneumococcal disease, but children under 76 years old, people with certain medical conditions, adults 65 years or older, and cigarette smokers are at the highest risk. Most pneumococcal infections are mild. However, some can result in long-term problems, such as brain damage or hearing loss. Meningitis, bacteremia, and pneumonia caused by pneumococcal disease can be fatal. 2. PCV13 PCV13 protects against 13 types of bacteria that cause pneumococcal disease. Infants and young children usually need 4 doses of pneumococcal conjugate vaccine, at ages 24, 19, 68, and 12-15 months. Older children (through age 68 months) may be vaccinated if they did not receive the recommended doses. A dose of PCV13 is also recommended for adults and children 6  years or older with certain medical conditions if they did not already receive PCV13. This vaccine may be given to healthy adults 65 years or older who did not already receive PCV13, based on discussions between the patient and health care provider. 3. Talk with your health care provider Tell your vaccination provider if the person getting the vaccine:  Has had an allergic reaction after a previous dose of PCV13, to an earlier pneumococcal conjugate vaccine known as PCV7, or to any vaccine containing diphtheria toxoid (for example, DTaP), or has any severe,  life-threatening allergies In some cases, your health care provider may decide to postpone PCV13 vaccination until a future visit. People with minor illnesses, such as a cold, may be vaccinated. People who are moderately or severely ill should usually wait until they recover before getting PCV13. Your health care provider can give you more information. 4. Risks of a vaccine reaction  Redness, swelling, pain, or tenderness where the shot is given, and fever, loss of appetite, fussiness (irritability), feeling tired, headache, and chills can happen after PCV13 vaccination. Young children may be at increased risk for seizures caused by fever after PCV13 if it is administered at the same time as inactivated influenza vaccine. Ask your health care provider for more information. People sometimes faint after medical procedures, including vaccination. Tell your provider if you feel dizzy or have vision changes or ringing in the ears. As with any medicine, there is a very remote chance of a vaccine causing a severe allergic reaction, other serious injury, or death. 5. What if there is a serious problem? An allergic reaction could occur after the vaccinated person leaves the clinic. If you see signs of a severe allergic reaction (hives, swelling of the face and throat, difficulty breathing, a fast heartbeat, dizziness, or weakness), call 9-1-1 and get the person to the nearest hospital. For other signs that concern you, call your health care provider. Adverse reactions should be reported to the Vaccine Adverse Event Reporting System (VAERS). Your health care provider will usually file this report, or you can do it yourself. Visit the VAERS website at www.vaers.LAgents.no or call 314-131-9639. VAERS is only for reporting reactions, and VAERS staff members do not give medical advice. 6. The National Vaccine Injury Compensation Program The [employer] Vaccine Injury Compensation Program (VICP) is a federal program  that was created to compensate people who may have been injured by certain vaccines. Claims regarding alleged injury or death due to vaccination have a time limit for filing, which may be as short as two years. Visit the VICP website at SpiritualWord.at or call 7172575633 to learn about the program and about filing a claim. 7. How can I learn more?  Ask your health care provider.  Call your local or state health department.  Visit the website of the Food and Drug Administration (FDA) for vaccine package inserts and additional information at FinderList.no.  Contact the Centers for Disease Control and Prevention (CDC): ? Call 814-778-5476 (1-800-CDC-INFO) or ? Visit CDC's website at PicCapture.uy. Vaccine Information Statement PCV13 (07/18/2020) This information is not intended to replace advice given to you by your health care provider. Make sure you discuss any questions you have with your health care provider. Document Revised: 09/04/2020 Document Reviewed: 09/04/2020 Elsevier Patient Education  2021 Elsevier Inc.   Td (Tetanus, Diphtheria) Vaccine: What You Need to Know 1. Why get vaccinated? Td vaccine can prevent tetanus and diphtheria. Tetanus enters the body through cuts or wounds. Diphtheria spreads from person to  person.  TETANUS (T) causes painful stiffening of the muscles. Tetanus can lead to serious health problems, including being unable to open the mouth, having trouble swallowing and breathing, or death.  DIPHTHERIA (D) can lead to difficulty breathing, heart failure, paralysis, or death. 2. Td vaccine Td is only for children 7 years and older, adolescents, and adults.  Td is usually given as a booster dose every 10 years, or after 5 years in the case of a severe or dirty wound or burn. Another vaccine, called "Tdap," may be used instead of Td. Tdap protects against pertussis, also known as "whooping cough," in  addition to tetanus and diphtheria. Td may be given at the same time as other vaccines. 3. Talk with your health care provider Tell your vaccination provider if the person getting the vaccine:  Has had an allergic reaction after a previous dose of any vaccine that protects against tetanus or diphtheria, or has any severe, life-threatening allergies  Has ever had Guillain-Barr Syndrome (also called "GBS")  Has had severe pain or swelling after a previous dose of any vaccine that protects against tetanus or diphtheria In some cases, your health care provider may decide to postpone Td vaccination until a future visit. People with minor illnesses, such as a cold, may be vaccinated. People who are moderately or severely ill should usually wait until they recover before getting Td vaccine.  Your health care provider can give you more information. 4. Risks of a vaccine reaction  Pain, redness, or swelling where the shot was given, mild fever, headache, feeling tired, and nausea, vomiting, diarrhea, or stomachache sometimes happen after Td vaccination. People sometimes faint after medical procedures, including vaccination. Tell your provider if you feel dizzy or have vision changes or ringing in the ears.  As with any medicine, there is a very remote chance of a vaccine causing a severe allergic reaction, other serious injury, or death. 5. What if there is a serious problem? An allergic reaction could occur after the vaccinated person leaves the clinic. If you see signs of a severe allergic reaction (hives, swelling of the face and throat, difficulty breathing, a fast heartbeat, dizziness, or weakness), call 9-1-1 and get the person to the nearest hospital.  For other signs that concern you, call your health care provider.  Adverse reactions should be reported to the Vaccine Adverse Event Reporting System (VAERS). Your health care provider will usually file this report, or you can do it yourself. Visit  the VAERS website at www.vaers.LAgents.nohhs.gov or call 601-469-32041-312-499-8813. VAERS is only for reporting reactions, and VAERS staff members do not give medical advice. 6. The National Vaccine Injury Compensation Program The [employer] Vaccine Injury Compensation Program (VICP) is a federal program that was created to compensate people who may have been injured by certain vaccines. Claims regarding alleged injury or death due to vaccination have a time limit for filing, which may be as short as two years. Visit the VICP website at SpiritualWord.atwww.hrsa.gov/vaccinecompensation or call (463)503-46101-870-540-4185 to learn about the program and about filing a claim. 7. How can I learn more?  Ask your health care provider.  Call your local or state health department.  Visit the website of the Food and Drug Administration (FDA) for vaccine package inserts and additional information at FinderList.nowww.fda.gov/vaccines-blood-biologics/vaccines.  Contact the Centers for Disease Control and Prevention (CDC): ? Call 48486008231-972-579-9232 (1-800-CDC-INFO) or ? Visit CDC's website at PicCapture.uywww.cdc.gov/vaccines. Vaccine Information Statement Td (Tetanus, Diphtheria) Vaccine (07/18/2020) This information is not intended to replace advice  given to you by your health care provider. Make sure you discuss any questions you have with your health care provider. Document Revised: 09/04/2020 Document Reviewed: 09/04/2020 Elsevier Patient Education  2021 Elsevier Inc.  Influenza Virus Vaccine injection (Fluarix) What is this medicine? INFLUENZA VIRUS VACCINE (in floo EN zuh VAHY ruhs vak SEEN) helps to reduce the risk of getting influenza also known as the flu. This medicine may be used for other purposes; ask your health care provider or pharmacist if you have questions. COMMON BRAND NAME(S): Fluarix, Fluzone What should I tell my health care provider before I take this medicine? They need to know if you have any of these conditions:  bleeding disorder like hemophilia  fever  or infection  Guillain-Barre syndrome or other neurological problems  immune system problems  infection with the human immunodeficiency virus (HIV) or AIDS  low blood platelet counts  multiple sclerosis  an unusual or allergic reaction to influenza virus vaccine, eggs, chicken proteins, latex, gentamicin, other medicines, foods, dyes or preservatives  pregnant or trying to get pregnant  breast-feeding How should I use this medicine? This vaccine is for injection into a muscle. It is given by a health care professional. A copy of Vaccine Information Statements will be given before each vaccination. Read this sheet carefully each time. The sheet may change frequently. Talk to your pediatrician regarding the use of this medicine in children. Special care may be needed. Overdosage: If you think you have taken too much of this medicine contact a poison control center or emergency room at once. NOTE: This medicine is only for you. Do not share this medicine with others. What if I miss a dose? This does not apply. What may interact with this medicine?  chemotherapy or radiation therapy  medicines that lower your immune system like etanercept, anakinra, infliximab, and adalimumab  medicines that treat or prevent blood clots like warfarin  phenytoin  steroid medicines like prednisone or cortisone  theophylline  vaccines This list may not describe all possible interactions. Give your health care provider a list of all the medicines, herbs, non-prescription drugs, or dietary supplements you use. Also tell them if you smoke, drink alcohol, or use illegal drugs. Some items may interact with your medicine. What should I watch for while using this medicine? Report any side effects that do not go away within 3 days to your doctor or health care professional. Call your health care provider if any unusual symptoms occur within 6 weeks of receiving this vaccine. You may still catch the flu,  but the illness is not usually as bad. You cannot get the flu from the vaccine. The vaccine will not protect against colds or other illnesses that may cause fever. The vaccine is needed every year. What side effects may I notice from receiving this medicine? Side effects that you should report to your doctor or health care professional as soon as possible:  allergic reactions like skin rash, itching or hives, swelling of the face, lips, or tongue Side effects that usually do not require medical attention (report to your doctor or health care professional if they continue or are bothersome):  fever  headache  muscle aches and pains  pain, tenderness, redness, or swelling at site where injected  weak or tired This list may not describe all possible side effects. Call your doctor for medical advice about side effects. You may report side effects to FDA at 1-800-FDA-1088. Where should I keep my medicine? This vaccine is only  given in a clinic, pharmacy, doctor's office, or other health care setting and will not be stored at home. NOTE: This sheet is a summary. It may not cover all possible information. If you have questions about this medicine, talk to your doctor, pharmacist, or health care provider.  2021 Elsevier/Gold Standard (2008-06-26 09:30:40)

## 2021-02-24 NOTE — Assessment & Plan Note (Signed)
A1c greater than 15 lack of follow-up lack of adherence to insulin products  We gave this patient multiple doses of short acting insulin at this visit her blood sugar was 468 at the end of this she was orally hydrated  Patient is to resume insulin glargine 30 units at bedtime insulin lispro 5 units 3 times a day with meals  Patient to return short-term follow-up in 1 week with clinical pharmacist and with Dr. Delford Field in 4 weeks

## 2021-02-25 LAB — CBC WITH DIFFERENTIAL/PLATELET
Basophils Absolute: 0 10*3/uL (ref 0.0–0.2)
Basos: 1 %
EOS (ABSOLUTE): 0.2 10*3/uL (ref 0.0–0.4)
Eos: 2 %
Hematocrit: 38.9 % (ref 34.0–46.6)
Hemoglobin: 12.3 g/dL (ref 11.1–15.9)
Immature Grans (Abs): 0 10*3/uL (ref 0.0–0.1)
Immature Granulocytes: 1 %
Lymphocytes Absolute: 1.4 10*3/uL (ref 0.7–3.1)
Lymphs: 16 %
MCH: 31.1 pg (ref 26.6–33.0)
MCHC: 31.6 g/dL (ref 31.5–35.7)
MCV: 98 fL — ABNORMAL HIGH (ref 79–97)
Monocytes Absolute: 0.5 10*3/uL (ref 0.1–0.9)
Monocytes: 6 %
Neutrophils Absolute: 6.7 10*3/uL (ref 1.4–7.0)
Neutrophils: 74 %
Platelets: 276 10*3/uL (ref 150–450)
RBC: 3.96 x10E6/uL (ref 3.77–5.28)
RDW: 11.3 % — ABNORMAL LOW (ref 11.7–15.4)
WBC: 8.8 10*3/uL (ref 3.4–10.8)

## 2021-02-25 LAB — LIPID PANEL
Chol/HDL Ratio: 4.7 ratio — ABNORMAL HIGH (ref 0.0–4.4)
Cholesterol, Total: 227 mg/dL — ABNORMAL HIGH (ref 100–199)
HDL: 48 mg/dL (ref >39)
LDL Chol Calc (NIH): 112 mg/dL — ABNORMAL HIGH (ref 0–99)
Triglycerides: 392 mg/dL — ABNORMAL HIGH (ref 0–149)
VLDL Cholesterol Cal: 67 mg/dL — ABNORMAL HIGH (ref 5–40)

## 2021-02-25 LAB — COMPREHENSIVE METABOLIC PANEL
ALT: 106 IU/L — ABNORMAL HIGH (ref 0–32)
AST: 91 IU/L — ABNORMAL HIGH (ref 0–40)
Albumin/Globulin Ratio: 1.5 (ref 1.2–2.2)
Albumin: 4 g/dL (ref 3.7–4.7)
Alkaline Phosphatase: 160 IU/L — ABNORMAL HIGH (ref 44–121)
BUN/Creatinine Ratio: 29 — ABNORMAL HIGH (ref 12–28)
BUN: 19 mg/dL (ref 8–27)
Bilirubin Total: 0.3 mg/dL (ref 0.0–1.2)
CO2: 19 mmol/L — ABNORMAL LOW (ref 20–29)
Calcium: 8.8 mg/dL (ref 8.7–10.3)
Chloride: 95 mmol/L — ABNORMAL LOW (ref 96–106)
Creatinine, Ser: 0.66 mg/dL (ref 0.57–1.00)
Globulin, Total: 2.7 g/dL (ref 1.5–4.5)
Glucose: 481 mg/dL — ABNORMAL HIGH (ref 65–99)
Potassium: 3.8 mmol/L (ref 3.5–5.2)
Sodium: 131 mmol/L — ABNORMAL LOW (ref 134–144)
Total Protein: 6.7 g/dL (ref 6.0–8.5)
eGFR: 93 mL/min/{1.73_m2} (ref >59)

## 2021-02-25 LAB — MICROALBUMIN / CREATININE URINE RATIO
Creatinine, Urine: 20.3 mg/dL
Microalb/Creat Ratio: 221 mg/g creat — ABNORMAL HIGH (ref 0–29)
Microalbumin, Urine: 44.9 ug/mL

## 2021-02-25 NOTE — Telephone Encounter (Addendum)
Pt has scheduled appt with Francis Dowse, PA on 03/04/21.  Placed note on appt to draw CBC and BMP at [facility].  Dr Delford Field saw pt at [facility] and refilled Eliquis x 4 months.  No rx needed at this time. Labwork obtained at [facility] with Dr Delford Field on 02/24/21 Creat 0.66, Hgb 12.3, age 75, weight 43.5kg, based on specified criteria pt is on appropriate dosage of Eliquis 5mg  BID.  Will refill rx.

## 2021-03-01 NOTE — Progress Notes (Addendum)
Cardiology Office Note Date:  03/01/2021  Patient ID:  Courtney [name], Courtney [name] 1946-01-30, MRN [id number] PCP:  Storm Frisk, MD  Cardiologist:  Dr. Duke Salvia (hospital consult 2018) Electrophysiologist: Dr. Graciela Husbands    Chief Complaint:   Lost to f/u    History of Present Illness: Courtney [name] is a 75 y.o. female with history of DM, tachy-brady w/PPM, AFib.  She comes in today to be seen for Dr. Graciela Husbands, last seen by him Sept 2019, at that time having some intermittent Afib symptomatic and planned for Afib clinic follow up possible 1c AAD after an echo.  Planned to update her labs including HgbAq1c and TSH.  Her BP was low and her metoprolol reduced.  More recently she was in [facility] admitted 10/06/2019 with altered mental status, found with DKA, and + Klebsieflla bacteremia, pneumonia, + large liver abscess and had TEE with questionable vegetation on the mitral valve/AV  TEE noted "In the setting of current bacteremia and implanted pacemaker, there do appear to be small filamentous strand-like material attached to the atrial surface of the mitral valve and just proximal to the aortic valve in the LVOT which could possibly represent vegetative material. This does not have the clinical appearance of the typical thick or shaggy vegetation. Nonetheless, consider treatment for possible endocarditis" Dr. Ladona Ridgel was consulted "The patient has Klebsiella bacteremia with possible involvement of the mitral and aortic valve but no veg seen on PM leads. I would recommend a full course of anti-biotic therapy as per the ID service followed by surviellance blood cultures. While this is thought to be a hypervirulent strain of Klebsiella, these germs usually do not stick to pacing/ICD leads. Obviously if she had treatment failure with recurrent bacteremia after a full course of anti-biotics, then extraction of the pacing system would be required."  She was last seen in-patient by Dr. Duke Salvia, at that time was s/p liver  asbcess drainage.  She was off her a/c with recent IR procedure, her metoprolol had been held (seems 2/2 hypotension early in the hospital stay) and recommended consideration for resumption in clinic. Planned to complete antibiotic course, no device extraction unless failed to clear blood cultures.cardiology signed off 10/18/2019. She was discharged 10/18/2019  Follow up with ID 11/20/2019 JP drain was being emptied about Q 3 days, reported 1/4-1/2 full Pt/family reported PICC line was removed 12/7, though family thought abx were to continue through 12/14. Pt c/o chest heaviness and SOB, symptoms of orthopnea requiring more pillows (and lueral effusions noted at her hospital stay)labs were improving  Started on PO Ciprofloxacin, planned for repeat CT, and follow up with IR drain clinic picc line was indeed removed early (unclear why) though not planned to replace and PO abx as noted pending her CT finding. BC reported cleared 10/11/2019 CP/SOB, ID NP noted "slightly decreased breath sounds overlying the RLL > LLL but overall has good airflow. I worry she may not have fully decompressed liver abscess with increased pressure on diaphragm" also ordered for an echo though this appears to have been cancelled.  Seems she is scheduled for screening colonoscopy and requiring cardiology evaluation/clearance as well as Eliquis recommendations pre-colonoscopy. GI mentioned MRI/MRCP would be optimal for surveillance/management of her gI issues, though unable with PPM   ID mentions she had lost 30lbs with her infection, hospitalization, post hospitalization, weighing 98lbs, her PMD prescribed Megace, though not yet started.  10/08/2019 CXR Slight interval worsening bilateral patchy airspace process likely multifocal infection.  10/14/2019 CT abd/pelvis  4. Moderate-sized bilateral pleural effusions, increased. 5. Progressive compressive atelectasis in both lower lobes.    I saw her 11/27/2019 Today's  visit is done with the aid of [employer] interpretor ([place] Courtney [name], [phone] She is accompanied by her grandson She is doing well, mentions she had no cardiac concerns, feels like her heart is OK.  She remains with some heaviness to her breath, not overtly SOB at rest, will get winded with ambulation and mentions that laying supine is harder for her to breathe as well.  Our elevator is broken this AM and she walked up the 3 flights of stairs, her grandson reported she did fine, was "a little SOB at the top"  She agrees, says she wad tired/out of breath once reaching the final steps, no CP. She had a R sided thorax pai, locates primary to her axilla, lateral thorax to R chest, this comes/goes, raisingher arm up makes it worse, can provoke it. She denies any difficulties with her ADLs No palpitations, no dizzy spells, near syncope or syncope. She denies any bleeding or signs of bleeding Her grandson confirmed accurate medicine list with the  MA today. She was schedued for a f/u echo via ID service, was still on Abx Recommended to f/u once GI drain was out, echo completed.  She has not returned No new BC that I see LVEF 60-65%, elevated LVEDP, grade IIDD, no vegetations noted  TODAY Today's visit is with the help of [employer]'s [place] translator [employer]. The patient is accompanied by her son who she lives with, he reports shehas been one day out of her atorvastatin, eliquis, flecainide and metoprolol, requests refills.  She seems to be doing OK. When asked about CP, she reports about 2-3 weeks of a constant aching discomfort to her superior chest, b/l shoulders and posterior neck.  Feels better when standing, worse when laying down.  Unchanges with ambulation. No associated SOB.  Though she does report periodic and random episodes of feeling breathless, some days she can walk and do things easily, some days gets winded with the same activities. Unclear how oong this has been going on  No  palpitations  No near syncope or syncope, does have vertigo and occassionally has a spinning dizziness that she has to hold on to something until it passes.  No bleeding or signs of bleeding Her hands feet always feel cold. 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 Allergy   . Atherosclerosis of aorta (HCC) 10/15/2019  . 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 Diabetes mellitus type 2, diet-controlled (HCC)   . PAF (paroxysmal atrial fibrillation) (HCC) 09/04/2017  . Sinus bradycardia 09/04/2017   HR 30s at times  . Tachy-brady syndrome (HCC) 09/04/2017    Past Surgical History:  Procedure Laterality Date  . IR GUIDED DRAIN W CATHETER PLACEMENT  10/15/2019  . IR PARACENTESIS  10/10/2019  . IR RADIOLOGIST EVAL & MGMT  12/21/2019  . IR US GUIDE BX ASP/DRAIN  10/10/2019  . PACEMAKER IMPLANT N/A 09/05/2017   Procedure: Pacemaker Implant;  Surgeon: Duke Salvia, MD;  Location: [facility] INVASIVE CV LAB;  Service: Cardiovascular;  Laterality: N/A;  . TEE WITHOUT CARDIOVERSION N/A 10/12/2019   Procedure: TRANSESOPHAGEAL ECHOCARDIOGRAM (TEE);  Surgeon: Jake Bathe, MD;  Location: [facility] ENDOSCOPY;  Service: Cardiovascular;  Laterality: N/A;  Current Outpatient Medications  Medication Sig Dispense Refill  . apixaban (ELIQUIS) 5 MG TABS tablet Take 1 tablet (5 mg total) by mouth 2 (two) times daily. Need MD appointment for refills 60 tablet 3  . atorvastatin (LIPITOR) 20 MG tablet Take 1 tablet (20 mg total) by mouth daily. 90 tablet 3  . flecainide (TAMBOCOR) 50 MG tablet Take 1 tablet (50 mg total) by mouth 2 (two) times daily. 180 tablet 1  . insulin aspart (NOVOLOG FLEXPEN) 100 UNIT/ML FlexPen Inject 5 Units into the skin 3 (three) times daily with meals. 15 mL 2  . Insulin Glargine (BASAGLAR  KWIKPEN) 100 UNIT/ML Inject 30 Units into the skin at bedtime. 15 mL 4  . Insulin Pen Needle 29G X MISC Use with insulin pens 100 each 3  . metoprolol tartrate (LOPRESSOR) 25 MG tablet Take 0.5 tablets (12.5 mg total) by mouth 2 (two) times daily. Please make overdue appt with Dr. Graciela Husbands before anymore refills. Thank you 1st attempt 30 tablet 6   No current facility-administered medications for this visit.    Allergies:   Patient has no known allergies.   Social History:  The patient  reports that she has quit smoking. She has never used smokeless tobacco. She reports that she does not drink alcohol and does not use drugs.   Family History:  The patient's family history includes Diabetes in her son.  ROS:  Please see the history of present illness.  All other systems are reviewed and otherwise negative.   PHYSICAL EXAM:  VS:  There were no vitals taken for this visit. BMI: There is no height or weight on file to calculate BMI. Well nourished, well developed, though very petite body habitus, in no acute distress  HEENT: normocephalic, atraumatic  Neck: no JVD, carotid bruits or masses Cardiac:  RRR; no significant murmurs, no rubs, or gallops Lungs:  CTA b/l, no wheezing, rhonchi or rales  Abd: soft, nontender MS: no deformity, very petite, thin body habitus, age appropriate atrophy Ext: *no edema  Skin: warm and dry, no rash Neuro:  No gross deficits appreciated Psych: euthymic mood, full affect  PPM site is stable, no tethering or discomfort, looks good, no signs of infection   EKG:  Done today and reviewed by myself  AP/VS, PR , QRS 71ms QTc   PPM interrogation done today and reviewed by myself;  Battery and lead measurements are good + AMS, AFib, flutter, all are very brief 2%  10/12/2019: TEE IMPRESSIONS 1. Left ventricular ejection fraction, by visual estimation, is 55 to 60%. The left ventricle has normal function. There is no left ventricular  hypertrophy.  2. Global right ventricle has normal systolic function.The right ventricular size is normal. No increase in right ventricular wall thickness.  3. No vegetations on pacer wires.  4. Left atrial size was normal.  5. Right atrial size was normal.  6. Mild mitral valve prolapse.  7. The mitral valve is abnormal. Mild mitral valve regurgitation. No evidence of mitral stenosis.  8. No trisuspid valve vegetation visualized.  9. The tricuspid valve is normal in structure. Tricuspid valve regurgitation is mild. 10. The aortic valve is tricuspid. Aortic valve regurgitation is not visualized. 11. No pulmonic vegetation present. 12. The pulmonic valve was grossly normal. Pulmonic valve regurgitation is not visualized. 13. Moderate plaque invoving the descending aorta. 14. A pacer wire is visualized in the RV and RA. 15. In the setting of current bacteremia and implanted pacemaker, there do  appear to be small filamentous strand-like material attached to the atrial surface of the mitral valve and just proximal to the aortic valve in the LVOT which could possibly  represent vegetative material. This does not have the clinical appearance of the typical thick or shaggy vegetation. Nonetheless, consider treatment for possible endocarditis.  FINDINGS  Left Ventricle: Left ventricular ejection fraction, by visual estimation, is 55 to 60%. The left ventricle has normal function. There is no left ventricular hypertrophy.  Right Ventricle: The right ventricular size is normal. No increase in right ventricular wall thickness. Global RV systolic function is has normal systolic function. No vegetations on pacer wires.  Left Atrium: Left atrial size was normal in size. No thrombus.  Right Atrium: Right atrial size was normal in size  Pericardium: There is no evidence of pericardial effusion. There is a small pleural effusion in the left lateral region.  Mitral Valve: The mitral valve is  abnormal. There is mild holosystolic prolapse of of the mitral valve. No evidence of mitral valve stenosis by observation. Mild mitral valve regurgitation. There are faint, filamentous strands attached to the atrial  surface of the mitral valve leaflets, freely mobile, challenging to visualize. This may represent vegetative material versus degenerative material of mitral valve. They are not as robust as ruptured cords.  Tricuspid Valve: The tricuspid valve is normal in structure. Tricuspid valve regurgitation is mild. No TV vegetation was visualized.  Aortic Valve: The aortic valve is tricuspid. Aortic valve regurgitation is not visualized.  Pulmonic Valve: The pulmonic valve was grossly normal. Pulmonic valve regurgitation is not visualized. No pulmonic vegetation present.  Aorta: The aortic root is normal in size and structure. There is moderate plaque involving the descending aorta. There are small filamentous strands attached to a segment of the left ventricular outflow tract approximately 5 mm below the aortic valve  leaflets that are freely mobile, possibly representing vegetative material.  Shunts: No atrial level shunt detected by color flow Doppler.  Additional Comments: In the setting of current bacteremia and implanted pacemaker, there do appear to be small filamentous strand-like material attached to the atrial surface of the mitral valve and just proximal to the aortic valve in the LVOT which  could possibly represent vegetative material. This does not have the clinical appearance of the typical thick or shaggy vegetation. Nonetheless, consider treatment for possible endocarditis. A pacer wire is visualized in the right ventricle and right  atrium.   Recent Labs: 02/24/2021: ALT 106; BUN 19; Creatinine, Ser 0.66; Hemoglobin 12.3; Platelets 276; Potassium 3.8; Sodium 131  02/24/2021: Chol/HDL Ratio 4.7; Cholesterol, Total 227; HDL 48; LDL Chol Calc (NIH) 112; Triglycerides 392    CrCl cannot be calculated (Unknown ideal weight.).   Wt Readings from Last 3 Encounters:  02/24/21 96 lb (43.5 kg)  12/19/19 100 lb 9.6 oz (45.6 kg)  12/05/19 101 lb 12.8 oz (46.2 kg)     Other studies reviewed: Additional studies/records reviewed today include: summarized above  ASSESSMENT AND PLAN:  1. PPM     Intact function, no programming changes made   2. Paroxysmal Afib     CHA2DS2Vasc is 3, on Eliquis,  appropriately dosed     On flecainide and metoprolol     Stable intervals     2% burden   3. h/o sepsis, bacteremia, Klebsiella Back in Oct 2020     Possible involvement of MV/AV, (TEE noted above), no clear involvement on leads     Recommended to have surveillance blood  cultures once ABX tx completed     I am not certain who if at all f/u cultures were done     [facility]  4. ? CP, DOE     Unfortunately I think we are losing some details in translation     I think her symptoms are mostly atypical     She is on Flecainide and think we should check     Will get echo and lexiscan stress test     I discussed the stress test procedure, potential risks and benefits she/they are agreeable   Disposition: remotes as usual, I will have her back in 6 weeks, sooner if needed  Current medicines are reviewed at length with the patient today.  The patient did not have any concerns regarding medicines.  Norma FredricksonSigned, Lemoyne Scarpati, PA-C 03/01/2021 4:31 PM     [facility] [address] [place] [address] [phone] (office)  [phone] (fax)

## 2021-03-02 ENCOUNTER — Ambulatory Visit (HOSPITAL_COMMUNITY)
Admission: RE | Admit: 2021-03-02 | Discharge: 2021-03-02 | Disposition: A | Discharge status: Home / Self Care | Payer: Medicare HMO | Source: Ambulatory Visit | Attending: Critical Care Medicine | Admitting: Critical Care Medicine

## 2021-03-02 ENCOUNTER — Other Ambulatory Visit: Payer: Self-pay

## 2021-03-02 DIAGNOSIS — R1011 Right upper quadrant pain: Secondary | ICD-10-CM | POA: Insufficient documentation

## 2021-03-02 DIAGNOSIS — K750 Abscess of liver: Secondary | ICD-10-CM | POA: Diagnosis not present

## 2021-03-02 DIAGNOSIS — R109 Unspecified abdominal pain: Secondary | ICD-10-CM | POA: Diagnosis not present

## 2021-03-04 ENCOUNTER — Encounter: Payer: Self-pay | Admitting: Physician Assistant

## 2021-03-04 ENCOUNTER — Ambulatory Visit (INDEPENDENT_AMBULATORY_CARE_PROVIDER_SITE_OTHER): Payer: Medicare HMO | Admitting: Physician Assistant

## 2021-03-04 ENCOUNTER — Other Ambulatory Visit: Payer: Self-pay

## 2021-03-04 VITALS — BP 118/60 | HR 60 | Ht <= 58 in | Wt 101.6 lb

## 2021-03-04 DIAGNOSIS — R079 Chest pain, unspecified: Secondary | ICD-10-CM

## 2021-03-04 DIAGNOSIS — I480 Paroxysmal atrial fibrillation: Secondary | ICD-10-CM

## 2021-03-04 DIAGNOSIS — Z950 Presence of cardiac pacemaker: Secondary | ICD-10-CM | POA: Diagnosis not present

## 2021-03-04 DIAGNOSIS — R0602 Shortness of breath: Secondary | ICD-10-CM

## 2021-03-04 DIAGNOSIS — I482 Chronic atrial fibrillation, unspecified: Secondary | ICD-10-CM

## 2021-03-04 LAB — CUP PACEART INCLINIC DEVICE CHECK
Battery Remaining Longevity: 129 mo
Battery Voltage: 2.99 V
Brady Statistic RA Percent Paced: 87 %
Brady Statistic RV Percent Paced: 0.45 %
Date Time Interrogation Session: 20220323183650
Implantable Lead Implant Date: 20180924
Implantable Lead Implant Date: 20180924
Implantable Lead Location: 753859
Implantable Lead Location: 753860
Implantable Lead Model: 1944
Implantable Lead Model: 1948
Implantable Pulse Generator Implant Date: 20180924
Lead Channel Impedance Value: 687.5 Ohm
Lead Channel Impedance Value: 800 Ohm
Lead Channel Pacing Threshold Amplitude: 0.5 V
Lead Channel Pacing Threshold Amplitude: 0.5 V
Lead Channel Pacing Threshold Amplitude: 1 V
Lead Channel Pacing Threshold Amplitude: 1 V
Lead Channel Pacing Threshold Pulse Width: 0.5 ms
Lead Channel Pacing Threshold Pulse Width: 0.5 ms
Lead Channel Pacing Threshold Pulse Width: 0.5 ms
Lead Channel Pacing Threshold Pulse Width: 0.5 ms
Lead Channel Sensing Intrinsic Amplitude: 12 mV
Lead Channel Sensing Intrinsic Amplitude: 3.5 mV
Lead Channel Setting Pacing Amplitude: 2 V
Lead Channel Setting Pacing Amplitude: 2.5 V
Lead Channel Setting Pacing Pulse Width: 0.5 ms
Lead Channel Setting Sensing Sensitivity: 2 mV
Pulse Gen Model: 2272
Pulse Gen Serial Number: 8940692

## 2021-03-04 MED ORDER — METOPROLOL TARTRATE 25 MG PO TABS: 12.5000 mg | ORAL_TABLET | Freq: Two times a day (BID) | ORAL | 2 refills | Status: DC

## 2021-03-04 MED ORDER — APIXABAN 5 MG PO TABS: 5.0000 mg | ORAL_TABLET | Freq: Two times a day (BID) | ORAL | 6 refills | Status: DC

## 2021-03-04 MED ORDER — FLECAINIDE ACETATE 50 MG PO TABS: 50.0000 mg | ORAL_TABLET | Freq: Two times a day (BID) | ORAL | 2 refills | Status: DC

## 2021-03-04 NOTE — Patient Instructions (Signed)
Medication Instructions:   Your physician recommends that you continue on your current medications as directed. Please refer to the Current Medication list given to you today.  *If you need a refill on your cardiac medications before your next appointment, please call your pharmacy*   Lab Work: NONE ORDERED  TODAY   If you have labs (blood work) drawn today and your tests are completely normal, you will receive your results only by: Marland Kitchen MyChart Message (if you have MyChart) OR . A paper copy in the mail If you have any lab test that is abnormal or we need to change your treatment, we will call you to review the results.   Testing/Procedures: Your physician has requested that you have an echocardiogram. Echocardiography is a painless test that uses sound waves to create images of your heart. It provides your doctor with information about the size and shape of your heart and how well your heart's chambers and valves are working. This procedure takes approximately one hour. There are no restrictions for this procedure.  Your physician has requested that you have a lexiscan myoview. For further information please visit [email or web address] Please follow instruction sheet, as given.   Follow-Up: At [facility], you and your health needs are our priority.  As part of our continuing mission to provide you with exceptional heart care, we have created designated Provider Care Teams.  These Care Teams include your primary Cardiologist (physician) and Advanced Practice Providers (APPs -  Physician Assistants and Nurse Practitioners) who all work together to provide you with the care you need, when you need it.  We recommend signing up for the patient portal called "MyChart".  Sign up information is provided on this After Visit Summary.  MyChart is used to connect with patients for Virtual Visits (Telemedicine).  Patients are able to view lab/test results, encounter notes, upcoming appointments, etc.   Non-urgent messages can be sent to your provider as well.   To learn more about what you can do with MyChart, go to [email or web address].    Your next appointment:   6 week(s)  The format for your next appointment:   In Person  Provider:   Francis Dowse, PA-C  Other Instructions

## 2021-03-06 ENCOUNTER — Telehealth: Payer: Self-pay

## 2021-03-06 NOTE — Telephone Encounter (Signed)
Contacted pt to go over lab results spoke with Coralee North and provided results and she doesn't have any questions or concerns

## 2021-03-31 ENCOUNTER — Encounter (HOSPITAL_COMMUNITY): Payer: Medicare HMO

## 2021-03-31 ENCOUNTER — Encounter (HOSPITAL_COMMUNITY): Payer: Self-pay | Admitting: Physician Assistant

## 2021-03-31 ENCOUNTER — Other Ambulatory Visit (HOSPITAL_COMMUNITY): Payer: Medicare HMO

## 2021-04-12 NOTE — Progress Notes (Deleted)
Cardiology Office Note Date:  04/12/2021  Patient ID:  Courtney, [name] [birth year], MRN [id number] PCP:  Storm Frisk, MD  Cardiologist:  Dr. Duke Salvia (hospital consult 2018) Electrophysiologist: Dr. Graciela Husbands    Chief Complaint:   Lost to f/u    History of Present Illness: Courtney [name] is a 75 y.o. female with history of DM, tachy-brady w/PPM, AFib.  She comes in today to be seen for Dr. Graciela Husbands, last seen by him Sept 2019, at that time having some intermittent Afib symptomatic and planned for Afib clinic follow up possible 1c AAD after an echo.  Planned to update her labs including HgbAq1c and TSH.  Her BP was low and her metoprolol reduced.  More recently she was in [facility] admitted 10/06/2019 with altered mental status, found with DKA, and + Klebsieflla bacteremia, pneumonia, + large liver abscess and had TEE with questionable vegetation on the mitral valve/AV  TEE noted "In the setting of current bacteremia and implanted pacemaker, there do appear to be small filamentous strand-like material attached to the atrial surface of the mitral valve and just proximal to the aortic valve in the LVOT which could possibly represent vegetative material. This does not have the clinical appearance of the typical thick or shaggy vegetation. Nonetheless, consider treatment for possible endocarditis" Dr. Ladona Ridgel was consulted "The patient has Klebsiella bacteremia with possible involvement of the mitral and aortic valve but no veg seen on PM leads. I would recommend a full course of anti-biotic therapy as per the ID service followed by surviellance blood cultures. While this is thought to be a hypervirulent strain of Klebsiella, these germs usually do not stick to pacing/ICD leads. Obviously if she had treatment failure with recurrent bacteremia after a full course of anti-biotics, then extraction of the pacing system would be required."  She was last seen in-patient by Dr. Duke Salvia, at that time was s/p liver  asbcess drainage.  She was off her a/c with recent IR procedure, her metoprolol had been held (seems 2/2 hypotension early in the hospital stay) and recommended consideration for resumption in clinic. Planned to complete antibiotic course, no device extraction unless failed to clear blood cultures.cardiology signed off 10/18/2019. She was discharged 10/18/2019  Follow up with ID 11/20/2019 JP drain was being emptied about Q 3 days, reported 1/4-1/2 full Pt/family reported PICC line was removed 12/7, though family thought abx were to continue through 12/14. Pt c/o chest heaviness and SOB, symptoms of orthopnea requiring more pillows (and lueral effusions noted at her hospital stay)labs were improving  Started on PO Ciprofloxacin, planned for repeat CT, and follow up with IR drain clinic picc line was indeed removed early (unclear why) though not planned to replace and PO abx as noted pending her CT finding. BC reported cleared 10/11/2019 CP/SOB, ID NP noted "slightly decreased breath sounds overlying the RLL > LLL but overall has good airflow. I worry she may not have fully decompressed liver abscess with increased pressure on diaphragm" also ordered for an echo though this appears to have been cancelled.  Seems she is scheduled for screening colonoscopy and requiring cardiology evaluation/clearance as well as Eliquis recommendations pre-colonoscopy. GI mentioned MRI/MRCP would be optimal for surveillance/management of her gI issues, though unable with PPM   ID mentions she had lost 30lbs with her infection, hospitalization, post hospitalization, weighing 98lbs, her PMD prescribed Megace, though not yet started.  10/08/2019 CXR Slight interval worsening bilateral patchy airspace process likely multifocal infection.  10/14/2019 CT abd/pelvis  4. Moderate-sized bilateral pleural effusions, increased. 5. Progressive compressive atelectasis in both lower lobes.    I saw her 11/27/2019 Today's  visit is done with the aid of [employer] interpretor ([place] [name], [phone] She is accompanied by her grandson She is doing well, mentions she had no cardiac concerns, feels like her heart is OK.  She remains with some heaviness to her breath, not overtly SOB at rest, will get winded with ambulation and mentions that laying supine is harder for her to breathe as well.  Our elevator is broken this AM and she walked up the 3 flights of stairs, her grandson reported she did fine, was "a little SOB at the top"  She agrees, says she wad tired/out of breath once reaching the final steps, no CP. She had a R sided thorax pai, locates primary to her axilla, lateral thorax to R chest, this comes/goes, raisingher arm up makes it worse, can provoke it. She denies any difficulties with her ADLs No palpitations, no dizzy spells, near syncope or syncope. She denies any bleeding or signs of bleeding Her grandson confirmed accurate medicine list with the  MA today. She was schedued for a f/u echo via ID service, was still on Abx Recommended to f/u once GI drain was out, echo completed.  She has not returned No new BC that I see LVEF 60-65%, elevated LVEDP, grade IIDD, no vegetations noted  I saw her 74/3[birth year]: Today's visit is with the help of [employer]'s [place] translator [employer]. The patient is accompanied by her son who she lives with, he reports shehas been one day out of her atorvastatin, eliquis, flecainide and metoprolol, requests refills. She seems to be doing OK. When asked about CP, she reports about 2-3 weeks of a constant aching discomfort to her superior chest, b/l shoulders and posterior neck.  Feels better when standing, worse when laying down.  Unchanges with ambulation. No associated SOB. Though she does report periodic and random episodes of feeling breathless, some days she can walk and do things easily, some days gets winded with the same activities. Unclear how oong this has been going on No  palpitations No near syncope or syncope, does have vertigo and occassionally has a spinning dizziness that she has to hold on to something until it passes. No bleeding or signs of bleeding Her hands feet always feel cold. 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 Allergy   . Atherosclerosis of aorta (HCC) 10/15/2019  . 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 Diabetes mellitus type 2, diet-controlled (HCC)   . PAF (paroxysmal atrial fibrillation) (HCC) 09/04/2017  . Sinus bradycardia 09/04/2017   HR 30s at times  . Tachy-brady syndrome (HCC) 09/04/2017    Past Surgical History:  Procedure Laterality Date  . IR GUIDED DRAIN W CATHETER PLACEMENT  10/15/2019  . IR PARACENTESIS  10/10/2019  . IR RADIOLOGIST EVAL & MGMT  12/21/2019  . IR US GUIDE BX ASP/DRAIN  10/10/2019  . PACEMAKER IMPLANT N/A 09/05/2017   Procedure: Pacemaker Implant;  Surgeon: Duke Salvia, MD;  Location: [facility] INVASIVE CV LAB;  Service: Cardiovascular;  Laterality: N/A;  . TEE WITHOUT CARDIOVERSION N/A 10/12/2019   Procedure: TRANSESOPHAGEAL ECHOCARDIOGRAM (TEE);  Surgeon: Jake Bathe, MD;  Location: [facility] ENDOSCOPY;  Service: Cardiovascular;  Laterality: N/A;    Current Outpatient Medications  Medication Sig Dispense Refill  . apixaban (ELIQUIS) 5 MG TABS tablet Take 1 tablet (5 mg total) by mouth 2 (two) times daily. Need MD appointment for refills 60 tablet 6  . atorvastatin (LIPITOR) 20 MG tablet Take 1 tablet (20 mg total) by mouth daily.  90 tablet 3  . flecainide (TAMBOCOR) 50 MG tablet Take 1 tablet (50 mg total) by mouth 2 (two) times daily. 180 tablet 2  . Insulin Glargine (BASAGLAR KWIKPEN) 100 UNIT/ML Inject 30 Units into the skin at bedtime. 15 mL 4  . Insulin Pen Needle 29G X MISC Use with insulin pens 100 each 3  . metoprolol tartrate (LOPRESSOR) 25 MG tablet Take 0.5 tablets (12.5 mg total) by mouth 2 (two) times daily. 90 tablet 2   No current facility-administered medications for this visit.    Allergies:   Patient has no known allergies.   Social History:  The patient  reports that she has quit smoking. She has never used smokeless tobacco. She reports that she does not drink alcohol and does not use drugs.   Family History:  The patient's family history includes Diabetes in her son.  ROS:  Please see the history of present illness.  All other systems are reviewed and otherwise negative.   PHYSICAL EXAM:  VS:  There were no vitals taken for this visit. BMI: There is no height or weight on file to calculate BMI. Well nourished, well developed, though very petite body habitus, in no acute distress  HEENT: normocephalic, atraumatic  Neck: no JVD, carotid bruits or masses Cardiac:  *** RRR; no significant murmurs, no rubs, or gallops Lungs:  *** CTA b/l, no wheezing, rhonchi or rales  Abd: soft, nontender MS: no deformity, *** very petite, thin body habitus, age appropriate atrophy Ext: *** no edema  Skin: warm and dry, no rash Neuro:  No gross deficits appreciated Psych: euthymic mood, full affect  *** PPM site is stable, no tethering or discomfort, looks good, no signs of infection   EKG:  Done today and reviewed by myself  ***   PPM interrogation done today and reviewed by myself;  ***  Battery and lead measurements are good + AMS, AFib, flutter, all are very brief *** %  10/12/2019: TEE IMPRESSIONS 1. Left ventricular ejection fraction, by visual estimation, is 55 to 60%. The left  ventricle has normal function. There is no left ventricular hypertrophy.  2. Global right ventricle has normal systolic function.The right ventricular size is normal. No increase in right ventricular wall thickness.  3. No vegetations on pacer wires.  4. Left atrial size was normal.  5. Right atrial size was normal.  6. Mild mitral valve prolapse.  7. The mitral valve is abnormal. Mild mitral valve regurgitation. No evidence of mitral stenosis.  8. No trisuspid valve vegetation visualized.  9. The tricuspid valve is normal in structure. Tricuspid valve regurgitation is mild. 10. The aortic valve is tricuspid. Aortic valve regurgitation is not visualized. 11. No pulmonic vegetation present. 12. The pulmonic valve was grossly normal. Pulmonic valve regurgitation is not visualized. 13. Moderate plaque invoving the descending aorta. 14. A pacer wire is visualized in the RV and RA. 15. In the setting of  current bacteremia and implanted pacemaker, there do appear to be small filamentous strand-like material attached to the atrial surface of the mitral valve and just proximal to the aortic valve in the LVOT which could possibly  represent vegetative material. This does not have the clinical appearance of the typical thick or shaggy vegetation. Nonetheless, consider treatment for possible endocarditis.  FINDINGS  Left Ventricle: Left ventricular ejection fraction, by visual estimation, is 55 to 60%. The left ventricle has normal function. There is no left ventricular hypertrophy.  Right Ventricle: The right ventricular size is normal. No increase in right ventricular wall thickness. Global RV systolic function is has normal systolic function. No vegetations on pacer wires.  Left Atrium: Left atrial size was normal in size. No thrombus.  Right Atrium: Right atrial size was normal in size  Pericardium: There is no evidence of pericardial effusion. There is a small pleural effusion in the left  lateral region.  Mitral Valve: The mitral valve is abnormal. There is mild holosystolic prolapse of of the mitral valve. No evidence of mitral valve stenosis by observation. Mild mitral valve regurgitation. There are faint, filamentous strands attached to the atrial  surface of the mitral valve leaflets, freely mobile, challenging to visualize. This may represent vegetative material versus degenerative material of mitral valve. They are not as robust as ruptured cords.  Tricuspid Valve: The tricuspid valve is normal in structure. Tricuspid valve regurgitation is mild. No TV vegetation was visualized.  Aortic Valve: The aortic valve is tricuspid. Aortic valve regurgitation is not visualized.  Pulmonic Valve: The pulmonic valve was grossly normal. Pulmonic valve regurgitation is not visualized. No pulmonic vegetation present.  Aorta: The aortic root is normal in size and structure. There is moderate plaque involving the descending aorta. There are small filamentous strands attached to a segment of the left ventricular outflow tract approximately 5 mm below the aortic valve  leaflets that are freely mobile, possibly representing vegetative material.  Shunts: No atrial level shunt detected by color flow Doppler.  Additional Comments: In the setting of current bacteremia and implanted pacemaker, there do appear to be small filamentous strand-like material attached to the atrial surface of the mitral valve and just proximal to the aortic valve in the LVOT which  could possibly represent vegetative material. This does not have the clinical appearance of the typical thick or shaggy vegetation. Nonetheless, consider treatment for possible endocarditis. A pacer wire is visualized in the right ventricle and right  atrium.   Recent Labs: 02/24/2021: ALT 106; BUN 19; Creatinine, Ser 0.66; Hemoglobin 12.3; Platelets 276; Potassium 3.8; Sodium 131  02/24/2021: Chol/HDL Ratio 4.7; Cholesterol, Total 227;  HDL 48; LDL Chol Calc (NIH) 112; Triglycerides 392   CrCl cannot be calculated (Patient's most recent lab result is older than the maximum 21 days allowed.).   Wt Readings from Last 3 Encounters:  03/04/21 101 lb 9.6 oz (46.1 kg)  02/24/21 96 lb (43.5 kg)  12/19/19 100 lb 9.6 oz (45.6 kg)     Other studies reviewed: Additional studies/records reviewed today include: summarized above  ASSESSMENT AND PLAN:  1. PPM     *** Intact function, no programming changes made   2. Paroxysmal Afib     CHA2DS2Vasc is 3, on Eliquis, *** appropriately dosed     On flecainide and *** metoprolol     *** Stable intervals     *** % burden   3. h/o sepsis, bacteremia, Klebsiella Back in Oct 2020  Possible involvement of MV/AV, (TEE noted above), no clear involvement on leads     Recommended to have surveillance blood cultures once ABX tx completed     I am not certain who if at all f/u cultures were done     10/11/2019 [facility] x2 were both neg x5days  4. ? CP, DOE     Unfortunately I think we are losing some details in translation     I think her symptoms are mostly atypical     ***  Disposition: ***   Current medicines are reviewed at length with the patient today.  The patient did not have any concerns regarding medicines.  Norma Fredrickson, PA-C 04/12/2021 10:12 AM     [facility] [address] [place] [address] [phone] (office)  [phone] (fax)

## 2021-04-14 ENCOUNTER — Encounter: Payer: Medicare HMO | Admitting: Physician Assistant

## 2021-04-14 ENCOUNTER — Telehealth (HOSPITAL_COMMUNITY): Payer: Self-pay | Admitting: Physician Assistant

## 2021-04-14 ENCOUNTER — Ambulatory Visit (INDEPENDENT_AMBULATORY_CARE_PROVIDER_SITE_OTHER): Payer: Medicare HMO

## 2021-04-14 DIAGNOSIS — I495 Sick sinus syndrome: Secondary | ICD-10-CM

## 2021-04-14 LAB — CUP PACEART REMOTE DEVICE CHECK
Battery Remaining Longevity: 121 mo
Battery Remaining Percentage: 95.5 %
Battery Voltage: 2.99 V
Brady Statistic AP VP Percent: 1 %
Brady Statistic AP VS Percent: 96 %
Brady Statistic AS VP Percent: 1 %
Brady Statistic AS VS Percent: 3.6 %
Brady Statistic RA Percent Paced: 94 %
Brady Statistic RV Percent Paced: 1 %
Date Time Interrogation Session: 20220501005540
Implantable Lead Implant Date: 20180924
Implantable Lead Implant Date: 20180924
Implantable Lead Location: 753859
Implantable Lead Location: 753860
Implantable Lead Model: 1944
Implantable Lead Model: 1948
Implantable Pulse Generator Implant Date: 20180924
Lead Channel Impedance Value: 650 Ohm
Lead Channel Impedance Value: 710 Ohm
Lead Channel Pacing Threshold Amplitude: 0.5 V
Lead Channel Pacing Threshold Amplitude: 1 V
Lead Channel Pacing Threshold Pulse Width: 0.5 ms
Lead Channel Pacing Threshold Pulse Width: 0.5 ms
Lead Channel Sensing Intrinsic Amplitude: 12 mV
Lead Channel Sensing Intrinsic Amplitude: 3.3 mV
Lead Channel Setting Pacing Amplitude: 2 V
Lead Channel Setting Pacing Amplitude: 2.5 V
Lead Channel Setting Pacing Pulse Width: 0.5 ms
Lead Channel Setting Sensing Sensitivity: 2 mV
Pulse Gen Model: 2272
Pulse Gen Serial Number: 8940692

## 2021-04-14 NOTE — Telephone Encounter (Signed)
Just an FYI. We have made several attempts to contact this patient including sending a letter to schedule or reschedule their echocardiogram. We will be removing the patient from the echo WQ.   03/31/21 NO SHOWED -MAILED LETTER [facility]     Thank you 

## 2021-05-04 ENCOUNTER — Telehealth (HOSPITAL_COMMUNITY): Payer: Self-pay | Admitting: *Deleted

## 2021-05-04 NOTE — Telephone Encounter (Signed)
err

## 2021-05-04 NOTE — Telephone Encounter (Signed)
Letter sent outlining instructions for stress test on 05/08/21 at 7:45 sent via email.

## 2021-05-06 NOTE — Progress Notes (Signed)
Remote pacemaker transmission.   

## 2021-05-08 ENCOUNTER — Other Ambulatory Visit (HOSPITAL_COMMUNITY): Payer: Medicare HMO

## 2021-05-08 ENCOUNTER — Ambulatory Visit (HOSPITAL_BASED_OUTPATIENT_CLINIC_OR_DEPARTMENT_OTHER): Payer: Medicare HMO

## 2021-05-08 ENCOUNTER — Ambulatory Visit (HOSPITAL_COMMUNITY): Payer: Medicare HMO | Attending: Cardiology

## 2021-05-08 ENCOUNTER — Other Ambulatory Visit: Payer: Self-pay

## 2021-05-08 DIAGNOSIS — R079 Chest pain, unspecified: Secondary | ICD-10-CM | POA: Insufficient documentation

## 2021-05-08 LAB — MYOCARDIAL PERFUSION IMAGING
LV dias vol: 42 mL (ref 46–106)
LV sys vol: 10 mL
Peak HR: 71 {beats}/min
Rest HR: 60 {beats}/min
SDS: 2
SRS: 0
SSS: 2
TID: 1.05

## 2021-05-08 LAB — ECHOCARDIOGRAM COMPLETE
Area-P 1/2: 3.6 cm2
Calc EF: 67.1 %
Height: 53 in
S' Lateral: 2.3 cm
Single Plane A2C EF: 65.8 %
Single Plane A4C EF: 67.3 %
Weight: 1616 oz

## 2021-05-08 MED ORDER — TECHNETIUM TC 99M TETROFOSMIN IV KIT
30.9000 | PACK | Freq: Once | INTRAVENOUS | Status: AC | PRN
  Administered 2021-05-08: 30.9 via INTRAVENOUS
  Filled 2021-05-08: qty 31

## 2021-05-08 MED ORDER — REGADENOSON 0.4 MG/5ML IV SOLN
0.4000 mg | Freq: Once | INTRAVENOUS | Status: AC
  Administered 2021-05-08: 0.4 mg via INTRAVENOUS

## 2021-05-08 MED ORDER — TECHNETIUM TC 99M TETROFOSMIN IV KIT
10.1000 | PACK | Freq: Once | INTRAVENOUS | Status: AC | PRN
  Administered 2021-05-08: 10.1 via INTRAVENOUS
  Filled 2021-05-08: qty 11

## 2021-05-08 NOTE — Progress Notes (Signed)
Patient ID: Courtney [name], female   DOB: [birth year], 75 y.o.   MRN: [id number]  Echocardiogram 2D Echocardiogram has been performed.  Tye Savoy 05/08/21

## 2021-05-14 NOTE — Progress Notes (Signed)
Cardiology Office Note Date:  05/14/2021  Patient ID:  Courtney [name], Courtney [name] 1946/10/28, MRN [id number] PCP:  Storm Frisk, MD  Cardiologist:  Dr. Duke Salvia (hospital consult 2018) Electrophysiologist: Dr. Graciela Husbands    Chief Complaint:   Planned follow up  History of Present Illness: Courtney [name] is a 75 y.o. female with history of DM, tachy-brady w/PPM, AFib.  She comes in today to be seen for Dr. Graciela Husbands, last seen by him Sept 2019, at that time having some intermittent Afib symptomatic and planned for Afib clinic follow up possible 1c AAD after an echo.  Planned to update her labs including HgbAq1c and TSH.  Her BP was low and her metoprolol reduced.  More recently she was in [facility] admitted 10/06/2019 with altered mental status, found with DKA, and + Klebsieflla bacteremia, pneumonia, + large liver abscess and had TEE with questionable vegetation on the mitral valve/AV  TEE noted "In the setting of current bacteremia and implanted pacemaker, there do appear to be small filamentous strand-like material attached to the atrial surface of the mitral valve and just proximal to the aortic valve in the LVOT which could possibly represent vegetative material. This does not have the clinical appearance of the typical thick or shaggy vegetation. Nonetheless, consider treatment for possible endocarditis" Dr. Ladona Ridgel was consulted "The patient has Klebsiella bacteremia with possible involvement of the mitral and aortic valve but no veg seen on PM leads. I would recommend a full course of anti-biotic therapy as per the ID service followed by surviellance blood cultures. While this is thought to be a hypervirulent strain of Klebsiella, these germs usually do not stick to pacing/ICD leads. Obviously if she had treatment failure with recurrent bacteremia after a full course of anti-biotics, then extraction of the pacing system would be required."  She was last seen in-patient by Dr. Duke Salvia, at that time was s/p liver  asbcess drainage.  She was off her a/c with recent IR procedure, her metoprolol had been held (seems 2/2 hypotension early in the hospital stay) and recommended consideration for resumption in clinic. Planned to complete antibiotic course, no device extraction unless failed to clear blood cultures.cardiology signed off 10/18/2019. She was discharged 10/18/2019  Follow up with ID 11/20/2019 JP drain was being emptied about Q 3 days, reported 1/4-1/2 full Pt/family reported PICC line was removed 12/7, though family thought abx were to continue through 12/14. Pt c/o chest heaviness and SOB, symptoms of orthopnea requiring more pillows (and lueral effusions noted at her hospital stay)labs were improving  Started on PO Ciprofloxacin, planned for repeat CT, and follow up with IR drain clinic picc line was indeed removed early (unclear why) though not planned to replace and PO abx as noted pending her CT finding. BC reported cleared 10/11/2019 CP/SOB, ID NP noted "slightly decreased breath sounds overlying the RLL > LLL but overall has good airflow. I worry she may not have fully decompressed liver abscess with increased pressure on diaphragm" also ordered for an echo though this appears to have been cancelled.  Seems she is scheduled for screening colonoscopy and requiring cardiology evaluation/clearance as well as Eliquis recommendations pre-colonoscopy. GI mentioned MRI/MRCP would be optimal for surveillance/management of her gI issues, though unable with PPM   ID mentions she had lost 30lbs with her infection, hospitalization, post hospitalization, weighing 98lbs, her PMD prescribed Megace, though not yet started.  10/08/2019 CXR Slight interval worsening bilateral patchy airspace process likely multifocal infection.  10/14/2019 CT abd/pelvis 4. Moderate-sized  bilateral pleural effusions, increased. 5. Progressive compressive atelectasis in both lower lobes.    I saw her 11/27/2019 Today's  visit is done with the aid of [employer] interpretor ([place]), [name], [phone]#[id number] She is accompanied by her grandson She is doing well, mentions she had no cardiac concerns, feels like her heart is OK.  She remains with some heaviness to her breath, not overtly SOB at rest, will get winded with ambulation and mentions that laying supine is harder for her to breathe as well.  Our elevator is broken this AM and she walked up the 3 flights of stairs, her grandson reported she did fine, was "a little SOB at the top"  She agrees, says she wad tired/out of breath once reaching the final steps, no CP. She had a R sided thorax pai, locates primary to her axilla, lateral thorax to R chest, this comes/goes, raisingher arm up makes it worse, can provoke it. She denies any difficulties with her ADLs No palpitations, no dizzy spells, near syncope or syncope. She denies any bleeding or signs of bleeding Her grandson confirmed accurate medicine list with the  MA today. She was schedued for a f/u echo via ID service, was still on Abx Recommended to f/u once GI drain was out, echo completed.  She has not returned No new BC that I see LVEF 60-65%, elevated LVEDP, grade IIDD, no vegetations noted  I saw her 03/04/21: Today's visit is with the help of [employer]'s [place] translator [employer]. The patient is accompanied by her son who she lives with, he reports shehas been one day out of her atorvastatin, eliquis, flecainide and metoprolol, requests refills. She seems to be doing OK. When asked about CP, she reports about 2-3 weeks of a constant aching discomfort to her superior chest, b/l shoulders and posterior neck.  Feels better when standing, worse when laying down.  Unchanges with ambulation. No associated SOB. Though she does report periodic and random episodes of feeling breathless, some days she can walk and do things easily, some days gets winded with the same activities. Unclear how oong this has been going on No  palpitations No near syncope or syncope, does have vertigo and occassionally has a spinning dizziness that she has to hold on to something until it passes. No bleeding or signs of bleeding Her hands feet always feel cold. No symptoms of claudications No symptoms of illness  Suspect her sytmoms were atypical of coronary though also feel some details were being lost in translation, given her flecainide felt reasonable to pursue ischemic eval and planned for stress testing  TODAY She is accompanied by her son Translation with [employer] translator Dory PeruChung  She is doing well Her stress test was low risk and her son mentions she does heavy work in the yard hand cutting branches and activities like that, suspect musculoskeletal. She denies today any SOB, DOE. No dizzy spells, near syncope or syncope. 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 Allergy   . Atherosclerosis of aorta (HCC) 10/15/2019  . 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 Diabetes mellitus type 2, diet-controlled (HCC)   . PAF (paroxysmal atrial fibrillation) (HCC) 09/04/2017  .  Sinus bradycardia 09/04/2017   HR 30s at times  . Tachy-brady syndrome (HCC) 09/04/2017    Past Surgical History:  Procedure Laterality Date  . IR GUIDED DRAIN W CATHETER PLACEMENT  10/15/2019  . IR PARACENTESIS  10/10/2019  . IR RADIOLOGIST EVAL & MGMT  12/21/2019  . IR US GUIDE BX ASP/DRAIN  10/10/2019  . PACEMAKER IMPLANT N/A 09/05/2017   Procedure: Pacemaker Implant;  Surgeon: Duke Salvia, MD;  [facility]: [facility] INVASIVE CV LAB;  Service: Cardiovascular;  Laterality: N/A;  . TEE WITHOUT CARDIOVERSION N/A  10/12/2019   Procedure: TRANSESOPHAGEAL ECHOCARDIOGRAM (TEE);  Surgeon: Jake Bathe, MD;  Location: [facility] ENDOSCOPY;  Service: Cardiovascular;  Laterality: N/A;    Current Outpatient Medications  Medication Sig Dispense Refill  . apixaban (ELIQUIS) 5 MG TABS tablet Take 1 tablet (5 mg total) by mouth 2 (two) times daily. Need MD appointment for refills 60 tablet 6  . atorvastatin (LIPITOR) 20 MG tablet Take 1 tablet (20 mg total) by mouth daily. 90 tablet 3  . flecainide (TAMBOCOR) 50 MG tablet Take 1 tablet (50 mg total) by mouth 2 (two) times daily. 180 tablet 2  . Insulin Glargine (BASAGLAR KWIKPEN) 100 UNIT/ML Inject 30 Units into the skin at bedtime. 15 mL 4  . Insulin Pen Needle 29G X MISC Use with insulin pens 100 each 3  . metoprolol tartrate (LOPRESSOR) 25 MG tablet Take 0.5 tablets (12.5 mg total) by mouth 2 (two) times daily. 90 tablet 2   No current facility-administered medications for this visit.    Allergies:   Patient has no known allergies.   Social History:  The patient  reports that she has quit smoking. She has never used smokeless tobacco. She reports that she does not drink alcohol and does not use drugs.   Family History:  The patient's family history includes Diabetes in her son.  ROS:  Please see the history of present illness.  All other systems are reviewed and otherwise negative.   PHYSICAL EXAM:  VS:  There were no vitals taken for this visit. BMI: There is no height or weight on file to calculate BMI. Well nourished, well developed, though very petite body habitus, in no acute distress  HEENT: normocephalic, atraumatic  Neck: no JVD, carotid bruits or masses Cardiac:  RRR; no significant murmurs, no rubs, or gallops Lungs:  CTA b/l, no wheezing, rhonchi or rales  Abd: soft, nontender MS: no deformity, very petite, thin body habitus, age appropriate atrophy Ext: no edema  Skin: warm and dry, no rash Neuro:  No gross deficits appreciated Psych:  euthymic mood, full affect  PPM site is stable, no tethering or discomfort, looks good, no signs of infection   EKG:  Done today and reviewed by myself  SR 63bpm, stable intervals   PPM interrogation done today and reviewed by myself;  Battery and lead measurements are good + AMS, AFib, flutter, all are fairly brief, longest 3 hours, may 29,22 <1% V rates 60-80, few higher   05/08/21; stress myoview  The left ventricular ejection fraction is hyperdynamic (>65%).  Nuclear stress EF: 77%. No wall motion abnormalities  There was no ST segment deviation noted during stress.  This is a low risk study. No perfusion defects  The study is normal.     10/12/2019: TEE IMPRESSIONS 1. Left ventricular ejection fraction, by visual estimation, is 55 to 60%. The left ventricle has normal function. There is no left ventricular hypertrophy.  2. Global right ventricle  has normal systolic function.The right ventricular size is normal. No increase in right ventricular wall thickness.  3. No vegetations on pacer wires.  4. Left atrial size was normal.  5. Right atrial size was normal.  6. Mild mitral valve prolapse.  7. The mitral valve is abnormal. Mild mitral valve regurgitation. No evidence of mitral stenosis.  8. No trisuspid valve vegetation visualized.  9. The tricuspid valve is normal in structure. Tricuspid valve regurgitation is mild. 10. The aortic valve is tricuspid. Aortic valve regurgitation is not visualized. 11. No pulmonic vegetation present. 12. The pulmonic valve was grossly normal. Pulmonic valve regurgitation is not visualized. 13. Moderate plaque invoving the descending aorta. 14. A pacer wire is visualized in the RV and RA. 15. In the setting of current bacteremia and implanted pacemaker, there do appear to be small filamentous strand-like material attached to the atrial surface of the mitral valve and just proximal to the aortic valve in the LVOT which could possibly   represent vegetative material. This does not have the clinical appearance of the typical thick or shaggy vegetation. Nonetheless, consider treatment for possible endocarditis.  FINDINGS  Left Ventricle: Left ventricular ejection fraction, by visual estimation, is 55 to 60%. The left ventricle has normal function. There is no left ventricular hypertrophy.  Right Ventricle: The right ventricular size is normal. No increase in right ventricular wall thickness. Global RV systolic function is has normal systolic function. No vegetations on pacer wires.  Left Atrium: Left atrial size was normal in size. No thrombus.  Right Atrium: Right atrial size was normal in size  Pericardium: There is no evidence of pericardial effusion. There is a small pleural effusion in the left lateral region.  Mitral Valve: The mitral valve is abnormal. There is mild holosystolic prolapse of of the mitral valve. No evidence of mitral valve stenosis by observation. Mild mitral valve regurgitation. There are faint, filamentous strands attached to the atrial  surface of the mitral valve leaflets, freely mobile, challenging to visualize. This may represent vegetative material versus degenerative material of mitral valve. They are not as robust as ruptured cords.  Tricuspid Valve: The tricuspid valve is normal in structure. Tricuspid valve regurgitation is mild. No TV vegetation was visualized.  Aortic Valve: The aortic valve is tricuspid. Aortic valve regurgitation is not visualized.  Pulmonic Valve: The pulmonic valve was grossly normal. Pulmonic valve regurgitation is not visualized. No pulmonic vegetation present.  Aorta: The aortic root is normal in size and structure. There is moderate plaque involving the descending aorta. There are small filamentous strands attached to a segment of the left ventricular outflow tract approximately 5 mm below the aortic valve  leaflets that are freely mobile, possibly  representing vegetative material.  Shunts: No atrial level shunt detected by color flow Doppler.  Additional Comments: In the setting of current bacteremia and implanted pacemaker, there do appear to be small filamentous strand-like material attached to the atrial surface of the mitral valve and just proximal to the aortic valve in the LVOT which  could possibly represent vegetative material. This does not have the clinical appearance of the typical thick or shaggy vegetation. Nonetheless, consider treatment for possible endocarditis. A pacer wire is visualized in the right ventricle and right  atrium.   Recent Labs: 02/24/2021: ALT 106; BUN 19; Creatinine, Ser 0.66; Hemoglobin 12.3; Platelets 276; Potassium 3.8; Sodium 131  02/24/2021: Chol/HDL Ratio 4.7; Cholesterol, Total 227; HDL 48; LDL Chol Calc (NIH) 112; Triglycerides 392   CrCl  cannot be calculated (Patient's most recent lab result is older than the maximum 21 days allowed.).   Wt Readings from Last 3 Encounters:  05/08/21 101 lb (45.8 kg)  03/04/21 101 lb 9.6 oz (46.1 kg)  02/24/21 96 lb (43.5 kg)     Other studies reviewed: Additional studies/records reviewed today include: summarized above  ASSESSMENT AND PLAN:  1. PPM     Intact function, no programming changes made   2. Paroxysmal Afib     CHA2DS2Vasc is 3, on Eliquis, appropriately dosed     On flecainide and metoprolol      Stable intervals     <1 % burden  Refills sent today   3. h/o sepsis, bacteremia, Klebsiella Back in Oct 2020     Possible involvement of MV/AV, (TEE noted above), no clear involvement on leads     Recommended to have surveillance blood cultures once ABX tx completed     I am not certain who if at all f/u cultures were done     10/11/2019 [facility] x2 were both neg x5days   4. ? CP, DOE     Unfortunately I think we are losing some details in translation     Low risk stress test with no ischemia     Suspect musculoskeletal     No SOB  reported today   Disposition: remotes as usual, in clinic in 42mo, sooer if needed   Current medicines are reviewed at length with the patient today.  The patient did not have any concerns regarding medicines.  Norma Fredrickson, PA-C 05/14/2021 6:31 AM     [facility] [address] [place] [address] [phone] (office)  [phone] (fax)

## 2021-05-15 ENCOUNTER — Other Ambulatory Visit: Payer: Self-pay

## 2021-05-15 ENCOUNTER — Ambulatory Visit (INDEPENDENT_AMBULATORY_CARE_PROVIDER_SITE_OTHER): Payer: Medicare HMO | Admitting: Physician Assistant

## 2021-05-15 ENCOUNTER — Encounter: Payer: Self-pay | Admitting: Physician Assistant

## 2021-05-15 VITALS — BP 124/70 | HR 63 | Ht <= 58 in | Wt 108.8 lb

## 2021-05-15 DIAGNOSIS — Z950 Presence of cardiac pacemaker: Secondary | ICD-10-CM | POA: Diagnosis not present

## 2021-05-15 DIAGNOSIS — R0789 Other chest pain: Secondary | ICD-10-CM | POA: Diagnosis not present

## 2021-05-15 DIAGNOSIS — I480 Paroxysmal atrial fibrillation: Secondary | ICD-10-CM | POA: Diagnosis not present

## 2021-05-15 LAB — CUP PACEART INCLINIC DEVICE CHECK
Battery Remaining Longevity: 129 mo
Battery Voltage: 2.99 V
Brady Statistic RA Percent Paced: 90 %
Brady Statistic RV Percent Paced: 0.73 %
Date Time Interrogation Session: 20220603185441
Implantable Lead Implant Date: 20180924
Implantable Lead Implant Date: 20180924
Implantable Lead Location: 753859
Implantable Lead Location: 753860
Implantable Lead Model: 1944
Implantable Lead Model: 1948
Implantable Pulse Generator Implant Date: 20180924
Lead Channel Impedance Value: 712.5 Ohm
Lead Channel Impedance Value: 850 Ohm
Lead Channel Pacing Threshold Amplitude: 0.5 V
Lead Channel Pacing Threshold Amplitude: 0.5 V
Lead Channel Pacing Threshold Amplitude: 0.75 V
Lead Channel Pacing Threshold Amplitude: 0.75 V
Lead Channel Pacing Threshold Pulse Width: 0.5 ms
Lead Channel Pacing Threshold Pulse Width: 0.5 ms
Lead Channel Pacing Threshold Pulse Width: 0.5 ms
Lead Channel Pacing Threshold Pulse Width: 0.5 ms
Lead Channel Sensing Intrinsic Amplitude: 12 mV
Lead Channel Sensing Intrinsic Amplitude: 4.2 mV
Lead Channel Setting Pacing Amplitude: 2 V
Lead Channel Setting Pacing Amplitude: 2.5 V
Lead Channel Setting Pacing Pulse Width: 0.5 ms
Lead Channel Setting Sensing Sensitivity: 2 mV
Pulse Gen Model: 2272
Pulse Gen Serial Number: 8940692

## 2021-05-15 MED ORDER — FLECAINIDE ACETATE 50 MG PO TABS: 50.0000 mg | ORAL_TABLET | Freq: Two times a day (BID) | ORAL | 3 refills | Status: DC

## 2021-05-15 MED ORDER — ELIQUIS 5 MG PO TABS: 5.0000 mg | ORAL_TABLET | Freq: Two times a day (BID) | ORAL | 1 refills | Status: DC

## 2021-05-15 MED ORDER — ATORVASTATIN CALCIUM 20 MG PO TABS: 20.0000 mg | ORAL_TABLET | Freq: Every day | ORAL | 3 refills | Status: DC

## 2021-05-15 MED ORDER — METOPROLOL TARTRATE 25 MG PO TABS: 12.5000 mg | ORAL_TABLET | Freq: Two times a day (BID) | ORAL | 3 refills | Status: DC

## 2021-05-15 NOTE — Telephone Encounter (Signed)
13f, 49.4kg, scr 0.66 02/24/21, lovw/ursuy 05/15/21

## 2021-05-15 NOTE — Patient Instructions (Addendum)
Medication Instructions:  Your physician recommends that you continue on your current medications as directed. Please refer to the Current Medication list given to you today.  *If you need a refill on your cardiac medications before your next appointment, please call your pharmacy*   Lab Work: None ordered   If you have labs (blood work) drawn today and your tests are completely normal, you will receive your results only by: Marland Kitchen MyChart Message (if you have MyChart) OR . A paper copy in the mail If you have any lab test that is abnormal or we need to change your treatment, we will call you to review the results.   Testing/Procedures: None ordered    Follow-Up: At [facility], you and your health needs are our priority.  As part of our continuing mission to provide you with exceptional heart care, we have created designated Provider Care Teams.  These Care Teams include your primary Cardiologist (physician) and Advanced Practice Providers (APPs -  Physician Assistants and Nurse Practitioners) who all work together to provide you with the care you need, when you need it.  We recommend signing up for the patient portal called "MyChart".  Sign up information is provided on this After Visit Summary.  MyChart is used to connect with patients for Virtual Visits (Telemedicine).  Patients are able to view lab/test results, encounter notes, upcoming appointments, etc.  Non-urgent messages can be sent to your provider as well.   To learn more about what you can do with MyChart, go to [email or web address].    Your next appointment:   6 month(s)  The format for your next appointment:   In Person  Provider:   You may see Sherryl Manges, MD or one of the following Advanced Practice Providers on your designated Care Team:    Francis Dowse, [place]  Remote check in 3 months    Other Instructions None

## 2021-07-14 ENCOUNTER — Ambulatory Visit (INDEPENDENT_AMBULATORY_CARE_PROVIDER_SITE_OTHER): Payer: Medicare HMO

## 2021-07-14 DIAGNOSIS — I495 Sick sinus syndrome: Secondary | ICD-10-CM | POA: Diagnosis not present

## 2021-07-14 LAB — CUP PACEART REMOTE DEVICE CHECK
Battery Remaining Longevity: 80 mo
Battery Remaining Percentage: 64 %
Battery Voltage: 2.99 V
Brady Statistic AP VP Percent: 1 %
Brady Statistic AP VS Percent: 87 %
Brady Statistic AS VP Percent: 1 %
Brady Statistic AS VS Percent: 12 %
Brady Statistic RA Percent Paced: 86 %
Brady Statistic RV Percent Paced: 1 %
Date Time Interrogation Session: 20220802020014
Implantable Lead Implant Date: 20180924
Implantable Lead Implant Date: 20180924
Implantable Lead Location: 753859
Implantable Lead Location: 753860
Implantable Lead Model: 1944
Implantable Lead Model: 1948
Implantable Pulse Generator Implant Date: 20180924
Lead Channel Impedance Value: 760 Ohm
Lead Channel Impedance Value: 980 Ohm
Lead Channel Pacing Threshold Amplitude: 0.5 V
Lead Channel Pacing Threshold Amplitude: 0.75 V
Lead Channel Pacing Threshold Pulse Width: 0.5 ms
Lead Channel Pacing Threshold Pulse Width: 0.5 ms
Lead Channel Sensing Intrinsic Amplitude: 12 mV
Lead Channel Sensing Intrinsic Amplitude: 4.6 mV
Lead Channel Setting Pacing Amplitude: 2 V
Lead Channel Setting Pacing Amplitude: 2.5 V
Lead Channel Setting Pacing Pulse Width: 0.5 ms
Lead Channel Setting Sensing Sensitivity: 2 mV
Pulse Gen Model: 2272
Pulse Gen Serial Number: 8940692

## 2021-08-10 NOTE — Progress Notes (Signed)
Remote pacemaker transmission.   

## 2021-09-29 ENCOUNTER — Ambulatory Visit: Payer: Medicare HMO | Admitting: Family Medicine

## 2021-10-13 ENCOUNTER — Ambulatory Visit (INDEPENDENT_AMBULATORY_CARE_PROVIDER_SITE_OTHER): Payer: Medicare HMO

## 2021-10-13 DIAGNOSIS — I495 Sick sinus syndrome: Secondary | ICD-10-CM | POA: Diagnosis not present

## 2021-10-14 LAB — CUP PACEART REMOTE DEVICE CHECK
Battery Remaining Longevity: 76 mo
Battery Remaining Percentage: 62 %
Battery Voltage: 2.99 V
Brady Statistic AP VP Percent: 1 %
Brady Statistic AP VS Percent: 91 %
Brady Statistic AS VP Percent: 1 %
Brady Statistic AS VS Percent: 8.4 %
Brady Statistic RA Percent Paced: 90 %
Brady Statistic RV Percent Paced: 1 %
Date Time Interrogation Session: 20221102002042
Implantable Lead Implant Date: 20180924
Implantable Lead Implant Date: 20180924
Implantable Lead Location: 753859
Implantable Lead Location: 753860
Implantable Lead Model: 1944
Implantable Lead Model: 1948
Implantable Pulse Generator Implant Date: 20180924
Lead Channel Impedance Value: 710 Ohm
Lead Channel Impedance Value: 930 Ohm
Lead Channel Pacing Threshold Amplitude: 0.5 V
Lead Channel Pacing Threshold Amplitude: 0.75 V
Lead Channel Pacing Threshold Pulse Width: 0.5 ms
Lead Channel Pacing Threshold Pulse Width: 0.5 ms
Lead Channel Sensing Intrinsic Amplitude: 12 mV
Lead Channel Sensing Intrinsic Amplitude: 3.9 mV
Lead Channel Setting Pacing Amplitude: 2 V
Lead Channel Setting Pacing Amplitude: 2.5 V
Lead Channel Setting Pacing Pulse Width: 0.5 ms
Lead Channel Setting Sensing Sensitivity: 2 mV
Pulse Gen Model: 2272
Pulse Gen Serial Number: 8940692

## 2021-10-20 NOTE — Progress Notes (Signed)
Remote pacemaker transmission.   

## 2021-11-17 ENCOUNTER — Ambulatory Visit: Payer: Medicare HMO | Admitting: Family Medicine

## 2021-11-17 ENCOUNTER — Other Ambulatory Visit: Payer: Self-pay

## 2021-11-17 ENCOUNTER — Telehealth: Payer: Self-pay | Admitting: Internal Medicine

## 2021-11-17 ENCOUNTER — Ambulatory Visit: Payer: Medicare HMO | Admitting: Physician Assistant

## 2021-11-17 VITALS — BP 125/61 | HR 60 | Temp 98.2°F | Resp 18 | Wt 97.0 lb

## 2021-11-17 DIAGNOSIS — Z950 Presence of cardiac pacemaker: Secondary | ICD-10-CM | POA: Diagnosis not present

## 2021-11-17 DIAGNOSIS — Z7901 Long term (current) use of anticoagulants: Secondary | ICD-10-CM | POA: Diagnosis not present

## 2021-11-17 DIAGNOSIS — I482 Chronic atrial fibrillation, unspecified: Secondary | ICD-10-CM

## 2021-11-17 DIAGNOSIS — E1165 Type 2 diabetes mellitus with hyperglycemia: Secondary | ICD-10-CM

## 2021-11-17 DIAGNOSIS — R809 Proteinuria, unspecified: Secondary | ICD-10-CM

## 2021-11-17 DIAGNOSIS — Z794 Long term (current) use of insulin: Secondary | ICD-10-CM

## 2021-11-17 MED ORDER — INSULIN ASPART 100 UNIT/ML IJ SOLN: 5.0000 [IU] | Freq: Three times a day (TID) | INTRAMUSCULAR | 1 refills | Status: AC

## 2021-11-17 MED ORDER — METOPROLOL TARTRATE 25 MG PO TABS: 12.5000 mg | ORAL_TABLET | Freq: Two times a day (BID) | ORAL | 1 refills | Status: DC

## 2021-11-17 MED ORDER — ATORVASTATIN CALCIUM 20 MG PO TABS: 20.0000 mg | ORAL_TABLET | Freq: Every day | ORAL | 3 refills | Status: AC

## 2021-11-17 MED ORDER — BASAGLAR KWIKPEN 100 UNIT/ML ~~LOC~~ SOPN: 30.0000 [IU] | PEN_INJECTOR | Freq: Every day | SUBCUTANEOUS | 4 refills | Status: AC

## 2021-11-17 MED ORDER — INSULIN PEN NEEDLE 29G X 5MM MISC: 3 refills | Status: AC

## 2021-11-17 MED ORDER — APIXABAN 5 MG PO TABS: 5.0000 mg | ORAL_TABLET | Freq: Two times a day (BID) | ORAL | 1 refills | Status: DC

## 2021-11-17 MED ORDER — FLECAINIDE ACETATE 50 MG PO TABS: 50.0000 mg | ORAL_TABLET | Freq: Two times a day (BID) | ORAL | 1 refills | Status: DC

## 2021-11-17 NOTE — Telephone Encounter (Signed)
*  STAT* If patient is at the pharmacy, call can be transferred to refill team.   1. Which medications need to be refilled? (please list name of each medication and dose if known)  metoprolol tartrate (LOPRESSOR) 25 MG tablet flecainide (TAMBOCOR) 50 MG tablet  2. Which pharmacy/location (including street and city if local pharmacy) is medication to be sent to? [id number] - [place], [place] - [address]  3. Do they need a 30 day or 90 day supply? Needs enough medication to last until 04/09/22 [place]

## 2021-11-17 NOTE — Patient Instructions (Addendum)
I have sent refills of your medications to the pharmacy.  We will call you with today's lab results.  Because you will be out of the country for 6 months, it is important that you make sure cardiology is aware of this, they have scheduled follow-up appointments as well as pacemaker checks for you.  It is also of utmost importance that she begin to keep regular follow-up appointments every 3 months with her primary care provider.  Make sure that she follows up with him or return to the mobile unit as soon as she returns from [place] so we can get her back in the schedule.  Roney Jaffe, PA-C Physician Assistant [facility] Mobile Medicine [email or web address]   Piedad Climes ???ng huy?t, ng??i l?n Blood Glucose Monitoring, Adult Theo di ???ng huy?t (glucose) l m?t ph?n quan tr?ng trong x? tr b?nh ti?u ???ng c?a qu v?. Theo di ???ng huy?t bao g?m vi?c ki?m tra ???ng huy?t th??ng xuyn theo ch? d?n v duy tr nh?t k ho?c ghi l?i cc k?t qu? c?a qu v? theo th?i gian. Ki?m tra ???ng huy?t c?a qu v? th??ng xuyn v ghi nh?t k ???ng huy?t c th?: Gip qu v? v chuyn gia ch?m [place] s?c kh?e c?a qu v? ?i?u ch?nh k? ho?ch x? tr b?nh ti?u ???ng c?a qu v? n?u c?n, g?m c? thu?c ho?c insulin. Gip qu v? hi?u r th?c ?n, t?p th? d?c, b?nh t?t v thu?c men ?nh h??ng nh? th? no ??n ???ng huy?t c?a qu v?. Cho qu v? bi?t v? ???ng huy?t c?a qu v? nh? th? no b?t k? lc no. Qu v? c th? nhanh chng th?y qu v? ?ang c ???ng huy?t th?p (h? ???ng huy?t) hay ???ng huy?t cao (t?ng ???ng huy?t). Chuyn gia ch?m so?c s??c kho?e cu?a quy? vi? co? th? ???t ca?c mu?c tiu ?i?u tri? ring cho quy? vi?. Cc mu?c tiu se? d??a va?o tu?i, ca?c ti?nh tra?ng b?nh ly? khc cu?a quy? vi? va? quy? vi? ?a?p ??ng nh? th? na?o v??i ?i?u tri? b?nh ti?u ????ng. Nhn chung, mu?c tiu ?i?u tri? la? duy tri? l???ng ???ng huy?t nh? sau: Tr???c b??a ?n chnh (tr???c khi ?n): 80-130  mg/dL (8,2-5,0 mmol/L). Sau b??a ?n chnh (sau khi ?n): d???i 180 mg/dL (10 mmol/L). N?ng ?? A1C: d??i 7%. Cc v?t d?ng c?n [place]?t: My ?o ???ng huy?t. Que th? dnh cho my ?o c?a qu v?. M?i my ?o c cc que th? ring. Qu v? ph?i s? d?ng cc que th? ?i km my ?o c?a qu v?. M?t m?i kim ?? chm [place] ngn tay (l??i chch). Khng s?? du?ng b?t ky? l??i trch na?o l?n th? hai. D?ng c? gi? m?i trch (d?ng c? trch). M?t cu?n nh?t k ghi l?i k?t qu? c?a qu v?. Cch ki?m tra ???ng huy?t c?a qu v? Ki?m tra ????ng huy?t c?a qu v?  R?a tay trong t nh?t 20 giy b?ng x phng v n??c. Chm m?i chch [place]om[place]o ph?n bn c?nh c?a ngn tay (khng ph?i ??u ngn tay). Khng s? d?ng cng m?t ngn tay lin ti?p. Nh? nhng n?n ngn tay cho t?i khi c m?t gi?t mu nh?. Lm theo cc h??ng d?n km theo my ?o c?a qu v? ?? gi que th?, bi mu [place] que th? v dng my ?o ???ng huy?t c?a qu v?. Vi?t k?t qu? v b?t k? ghi ch no [place] nh?t k. S? d?ng cc v? tr thay th? M?t s? my ?o cho php qu v? s? d?ng  cc vng c? th? ngoi ngn tay (cc v? tr thay th?) ?? xt nghi?m mu. Cc v? tr thay th? ph? bi?n nh?t l c?ng tay, ?i v lng bn tay c?a qu v?. Cc v? tr thay th? c th? khng chnh xc nh? cc ngn tay, b?i v dng mu ? nh?ng khu v?c ny ch?y ch?m h?n. ?i?u ny c ngh?a r?ng k?t qu? qu v? nh?n ???c c th? b? ch?m v n c th? khc so v?i k?t qu? qu v? nh?n ???c ? ngn tay qu v?. Ch? s? d?ng ngn tay, v khng s? d?ng cc v? tr thay th?, n?u: Qu v? ngh? r?ng qu v? b? h? ???ng huy?t. ?i khi qu v? khng bi?t r?ng ???ng huy?t c?a qu v? ?ang xu?ng th?p (khng bi?t b? h? ???ng huy?t). L?i khuyn v khuy?n ngh? chung Nh?t k ???ng huy?t  M?i l?n qu v? ki?m tra ???ng huy?t, hy ghi l?i k?t qu?. ??ng th?i hy vi?t v? b?t c? nh?ng g c th? ?nh h??ng ??n ???ng huy?t c?a qu v?, ch?ng h?n nh? ch? ?? ?n v t?p luy?n cho ngy hm ?Marland Kitchen Thng tin ny c th? gip quy? vi? v chuyn gia ch?m [place] s?c kh?e c?a  quy? vi?: Xem bi?u ?? ???ng huy?t c?a qu v? theo th?i gian. ?i?u ch?nh k? ho?ch x? tr b?nh ti?u ???ng n?u c?n [place]?t. Ki?m tra xem my ?o c cho php qu v? t?i cc b?n ghi v? my vi tnh khng ho?c c ?ng d?ng cho my ?o khng. H?u h?t cc my ?o ???ng huy?t ??u l?u tr? b?n ghi ch? s? ???ng huy?t trong my. N?u qu v? b? ti?u ???ng tup 1: Ki?m tra ???ng huy?t c?a qu v? 4 l?n tr? ln m?i ngy n?u qu v? ?ang ?i?u tr? b?ng insulin tch c?c b?ng nhi?u l?n tim hng ngy (MDI) ho?c n?u qu v? ?ang s? d?ng b?m insulin. Ki?m tra ???ng huy?t c?a qu v?: Tr??c m?i b?a ?n chnh v b?a ?n nh?. Tr??c gi? ?i ng?. C?ng ki?m tra ???ng huy?t c?a qu v?: N?u qu v? c tri?u ch?ng h? ???ng huy?t. Sau khi ?i?u tr? ???ng huy?t th?p. Tr??c khi th?c hi?n cc ho?t ??ng c nguy c? b? th??ng, nh? li xe ho?c v?n hnh my mc. Tr??c v sau khi t?p th? d?c. Hai gi? sau b?a ?n chnh. Th?nh tho?ng t? 2:00 sng ??n 3:00 sng, theo ch? d?n. Qu v? c th? c?n ki?m tra ???ng huy?t th??ng xuyn h?n, 6-10 l?n m?t ngy, n?u: Qu v? b? b?nh ti?u ???ng khng ???c ki?m sot t?t. Qu v? ?ang b? ?m. N?u qu v? c ti?n s? b? h? ???ng huy?t n?ng. Qu v? khng bi?t b? h? ???ng huy?t. N?u qu v? b? ti?u ???ng tup 2: Ki?m tra ???ng huy?t 2 l?n tr? ln m?t ngy n?u qu v? tim insulin ho?c dng cc thu?c tr? ti?u ???ng khc. Ki?m tra ???ng huy?t 4 l?n tr? ln m?i ngy n?u qu v? ?ang p d?ng li?u php insulin tch c?c. Th?nh tho?ng, qu v? c?ng c th? c?n ki?m tra ???ng huy?t t? 2:00 gi? sng ??n 3:00 gi? sng, theo ch? d?n. C?ng ki?m tra ???ng huy?t c?a qu v?: Tr??c v sau khi t?p th? d?c. Tr??c khi th?c hi?n cc ho?t ??ng c nguy c? b? th??ng, nh? li xe ho?c v?n hnh my mc. Qu v? c th? c?n ki?m tra ???ng huy?t th??ng xuyn h?n, n?u: Thu?c c?a qu v? ???c ?i?u ch?nh.  B?nh ti?u ???ng c?a qu v? khng ???c ki?m sot t?t. Qu v? ?ang b? ?m. L??i khuyn chung Hy ch?c ch?n r?ng qu v? lun mang theo cc v?t d?ng. Sau khi  qu v? s? d?ng m?t vi h?p que th?, hy ?i?u ch?nh (hi?u ch?nh) my ?o ???ng huy?t theo h??ng d?n ?i km theo my. T?t c? my ?o ???ng huy?t ??u c m?t s? ?i?n tho?i "???ng dy nng" 24 gi? ?? g?i n?u qu v? c cu h?i ho?c c?n tr? gip. ??ng th?i hy lin l?c v?i chuyn gia ch?m [place] s?c kh?e n?u qu v? c cu h?i ho?c lo ng?i. N?i ?? tm thm thng tin American Diabetes Association (Hi?p h?i Ti?u ????ng My?): www.diabetes.org Association of Diabetes Care & Education Specialists (Hi?p h?i Chuyn gia Gio d?c Ch?m [place] B?nh ti?u ???ng): www.diabeteseducator.org Hy lin l?c v?i chuyn gia ch?m [place] s?c kh?e n?u: L??ng ???ng huy?t c?a qu v? t? 240 mg/dL (38,1 mmol/L) tr? ln trong 2 nga?y lin tu?c. Qu v? ? b? ?m ho?c b? s?t trong 2 ngy tr? ln v khng ??. Quy? vi? co? b?t ky? v?n ?? na?o sau ?y trong h?n 6 gi??: Qu v? khng th? ?n ho?c khng th? u?ng. Qu v? b? bu?n nn ho?c nn m?a. Qu v? b? tiu ch?y. Yu c?u tr? gip ngay l?p t?c n?u: ????ng huy?t cu?a quy? vi? d???i 54 mg/dL (3 mmol/L). Quy? vi? b? lu? l?n ho??c quy? vi? suy nghi? khng ro? ra?ng. Qu v? b? kh th?. Qu v? c l??ng [place] trong n???c ti?u ? m?c trung bnh ho??c cao. Nh?ng tri?u ch?ng ny c th? l bi?u hi?n c?a m?t v?n ?? nghim tr?ng c?n c?p c?u. Khng ch? xem tri?u ch?ng c h?t khng. Hy ?i khm ngay l?p t?c. G?i cho d?ch v? c?p c?u t?i ??a ph??ng (911 ? [place]?). Khng t? li xe ??n b?nh vi?n. Tm t?t Theo di ???ng huy?t l m?t ph?n quan tr?ng trong x? tr b?nh ti?u ???ng c?a qu v?. Theo di ???ng huy?t bao g?m vi?c ki?m tra ???ng huy?t th??ng xuyn theo ch? d?n v duy tr nh?t k ho?c ghi l?i cc k?t qu? c?a qu v? theo th?i gian. Chuyn gia ch?m so?c s??c kho?e cu?a quy? vi? co? th? ???t ca?c mu?c tiu ?i?u tri? ring cho quy? vi?. Cc mu?c tiu se? d??a va?o tu?i, ca?c ti?nh tra?ng b?nh ly? khc cu?a quy? vi? va? quy? vi? ?a?p ??ng nh? th? na?o v??i ?i?u tri? b?nh ti?u ????ng. M?i l?n qu v? ki?m  tra ???ng huy?t, hy ghi l?i k?t qu?. ??ng th?i, hy vi?t v? b?t c? nh?ng g c th? ?nh h??ng ??n ???ng huy?t c?a qu v?, ch?ng h?n nh? ch? ?? ?n v t?p luy?n cho ngy hm ?Marland Kitchen Thng tin ny khng nh?m m?c ?ch thay th? cho l?i khuyn m chuyn gia ch?m [place] s?c kh?e ni v?i qu v?. Hy b?o ??m qu v? ph?i th?o lu?n b?t k? v?n ?? g m qu v? c v?i chuyn gia ch?m [place] s?c kh?e c?a qu v?. Document Revised: 09/23/2020 Document Reviewed: 09/23/2020 Elsevier Patient Education  2022 [employer].

## 2021-11-17 NOTE — Telephone Encounter (Signed)
Pt's medications were sent to pt's pharmacy as requested. Confirmation received.  

## 2021-11-17 NOTE — Progress Notes (Signed)
Established Patient Office Visit  Subjective:  Patient ID: Courtney [name], female    DOB: [birth year]  Age: 75 y.o. MRN: [id number]  CC:  Chief Complaint  Patient presents with   Diabetes     HPI Courtney [name] presents for medication refills, states that she has a family emergency and has to leave tomorrow for [place], request medication to last her 6 months.  Reports that she has been checking blood glucose levels at home, gives range of blood glucose levels of 150-200.  States that she has been compliant to medications, however does state that she did run out of [place], was unable to state how long she has been out of this.  Denies any hyperglycemic symptoms or reported readings  Last follow-up with cardiology was in June 2022, assessment from that visit:  ASSESSMENT AND PLAN:   1. PPM     Intact function, no programming changes made     2. Paroxysmal Afib     CHA2DS2Vasc is 3, on Eliquis, appropriately dosed     On flecainide and metoprolol      Stable intervals     <1 % burden   Refills sent today     3. h/o sepsis, bacteremia, Klebsiella Back in Oct 2020     Possible involvement of MV/AV, (TEE noted above), no clear involvement on leads     Recommended to have surveillance blood cultures once ABX tx completed     I am not certain who if at all f/u cultures were done     10/11/2019 [facility] x2 were both neg x5days     4. ? CP, DOE     Unfortunately I think we are losing some details in translation     Low risk stress test with no ischemia     Suspect musculoskeletal     No SOB reported today     Disposition: remotes as usual, in clinic in 41mo sooer if needed    Last office visit with primary care provider Dr. PAsencion Noblewas in March 2022: Assessment & Plan:  I personally reviewed all images and lab [facility] as well as any outside material available during this office visit and agree with the  radiology impressions.    Chronic atrial fibrillation  (HCC) Chronic atrial fibrillation still present   We will resume Eliquis 5 mg twice daily   Continue flecainide as prescribed Continue metoprolol 12 and half milligrams twice daily Begin atorvastatin daily   Referral to cardiology made       Chronic anticoagulation Resume Eliquis with CHA2DS2-VASc score of 2   Uncontrolled type 2 diabetes mellitus (HCC) A1c greater than 15 lack of follow-up lack of adherence to insulin products   We gave this patient multiple doses of short acting insulin at this visit her blood sugar was 468 at the end of this she was orally hydrated   Patient is to resume insulin glargine 30 units at bedtime insulin lispro 5 units 3 times a day with meals   Patient to return short-term follow-up in 1 week with clinical pharmacist and with Dr. 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, hypervirulent pathogen 10/13/2019   Diabetes mellitus type 2, diet-controlled ([place])    PAF (paroxysmal atrial fibrillation) ([place]) 09/04/2017   Sinus bradycardia 09/04/2017   HR 30s at times   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    Past Surgical History:  Procedure Laterality Date   IR GUIDED DRAIN W CATHETER PLACEMENT  10/15/2019   IR PARACENTESIS  10/10/2019   IR RADIOLOGIST EVAL & MGMT  12/21/2019   IR US GUIDE BX ASP/DRAIN  10/10/2019   PACEMAKER IMPLANT N/A 09/05/2017   Procedure: Pacemaker Implant;  Surgeon: Deboraha Sprang, MD;  Location: [facility];  Service:  Cardiovascular;  Laterality: N/A;   TEE WITHOUT CARDIOVERSION N/A 10/12/2019   Procedure: TRANSESOPHAGEAL ECHOCARDIOGRAM (TEE);  Surgeon: Jerline Pain, MD;  Location: [facility] ENDOSCOPY;  Service: Cardiovascular;  Laterality: N/A;    Family History  Problem Relation Age of Onset   Diabetes Son     Social History   Socioeconomic History   Marital status: Single    Spouse name: Not on file   Number of children: Not on file   Years of education: Not on file   Highest education level: Not on file  Occupational History   Not on file  Tobacco Use   Smoking status: Former   Smokeless tobacco: Never  Vaping Use   Vaping Use: Never used  Substance and Sexual Activity   Alcohol use: No   Drug use: No   Sexual activity: Not Currently  Other Topics Concern   Not on file  Social History Narrative   Not on file   Social Determinants of Health   Financial Resource Strain: Not on file  Food Insecurity: Not on file  Transportation Needs: Not on file  Physical Activity: Not on file  Stress: Not on file  Social Connections: Not on file  Intimate Partner Violence: Not on file    Outpatient Medications Prior to Visit  Medication Sig Dispense Refill   apixaban (ELIQUIS) 5 MG TABS tablet Take 1 tablet (5 mg total) by mouth 2 (two) times daily. 180 tablet 1   atorvastatin (LIPITOR) 20 MG tablet Take 1 tablet (20 mg total) by mouth daily. 90 tablet 3   flecainide (TAMBOCOR) 50 MG tablet Take 1 tablet (50 mg total) by mouth 2 (two) times daily. 180 tablet 1   Insulin Glargine (BASAGLAR KWIKPEN) 100 UNIT/ML Inject 30 Units into the skin at bedtime. 15 mL 4   Insulin Pen Needle 29G X 5MM MISC Use with insulin pens 100 each 3   metoprolol tartrate (LOPRESSOR) 25 MG tablet Take 0.5 tablets (12.5 mg total) by mouth 2 (two) times daily. 90 tablet 1   No facility-administered medications prior to visit.    No Known Allergies  ROS Review of Systems  Constitutional: Negative.   HENT: Negative.     Eyes: Negative.   Respiratory:  Negative for shortness of breath.   Cardiovascular:  Negative for chest pain and palpitations.  Gastrointestinal: Negative.   Endocrine: Negative.   Genitourinary: Negative.   Musculoskeletal: Negative.   Allergic/Immunologic: Negative.   Neurological: Negative.   Hematological: Negative.   Psychiatric/Behavioral: Negative.       Objective:    Physical Exam Vitals and nursing note reviewed.  Constitutional:      Appearance: Normal appearance.  HENT:     Head: Normocephalic and atraumatic.     Right Ear: External ear normal.     Left Ear: External ear normal.     Nose: Nose normal.     Mouth/Throat:     Mouth: Mucous membranes are moist.     Pharynx: Oropharynx is clear.  Eyes:     Extraocular Movements: Extraocular movements intact.     Conjunctiva/sclera: Conjunctivae normal.     Pupils: Pupils are equal, round, and reactive to light.  Cardiovascular:     Rate and Rhythm: Normal rate and regular rhythm.     Pulses: Normal pulses.     Heart sounds: Normal heart sounds.  Pulmonary:     Effort: Pulmonary effort is normal.     Breath sounds: Normal breath sounds.  Musculoskeletal:        General: Normal range of motion.     Cervical back: Normal range of motion and neck supple.  Skin:    General: Skin is warm and dry.  Neurological:     General: No focal deficit present.     Mental Status: She is alert and oriented to person, place, and time.  Psychiatric:        Mood and Affect: Mood normal.        Behavior: Behavior normal.        Thought Content: Thought content normal.        Judgment: Judgment normal.    BP 125/61 (BP Location: Left Arm, Patient Position: Sitting, Cuff Size: Normal)   Pulse 60   Temp 98.2 F (36.8 C) (Oral)   Resp 18   Wt 97 lb (44 kg)   SpO2 97%   BMI 24.28 kg/m  Wt Readings from Last 3 Encounters:  11/17/21 97 lb (44 kg)  05/15/21 108 lb 12.8 oz (49.4 kg)  05/08/21 101 lb (45.8 kg)     Health  Maintenance Due  Topic Date Due   OPHTHALMOLOGY EXAM  Never done   COLONOSCOPY (Pts 45-60yr Insurance coverage will need to be confirmed)  Never done   MAMMOGRAM  Never done   Zoster Vaccines- Shingrix (1 of 2) Never done   DEXA SCAN  Never done   COVID-19 Vaccine (2 - Pfizer series) 09/28/2020   INFLUENZA VACCINE  07/13/2021   HEMOGLOBIN A1C  08/27/2021    There are no preventive care reminders to display for this patient.  Lab Results  Component Value Date   TSH 0.894 10/08/2019   Lab Results  Component Value Date   WBC 8.8 02/24/2021   HGB 12.3 02/24/2021   HCT 38.9 02/24/2021   MCV 98 (H) 02/24/2021   PLT 276 02/24/2021   Lab Results  Component Value Date   NA 131 (L) 02/24/2021   K 3.8 02/24/2021   CO2 19 (L) 02/24/2021   GLUCOSE 481 (H) 02/24/2021   BUN 19 02/24/2021   CREATININE 0.66 02/24/2021   BILITOT 0.3 02/24/2021   ALKPHOS 160 (H) 02/24/2021   AST 91 (H) 02/24/2021   ALT 106 (H) 02/24/2021   PROT 6.7 02/24/2021   ALBUMIN 4.0 02/24/2021   CALCIUM 8.8 02/24/2021   ANIONGAP 7 10/18/2019   EGFR 93 02/24/2021   Lab Results  Component Value Date   CHOL 227 (H) 02/24/2021   Lab Results  Component Value Date   HDL 48 02/24/2021   Lab Results  Component Value Date   LDLCALC 112 (H) 02/24/2021   Lab Results  Component Value Date  TRIG 392 (H) 02/24/2021   Lab Results  Component Value Date   CHOLHDL 4.7 (H) 02/24/2021   Lab Results  Component Value Date   HGBA1C >15.0 02/24/2021      Assessment & Plan:   Problem List Items Addressed This Visit       Cardiovascular and Mediastinum   Chronic atrial fibrillation (HCC)   Relevant Medications   apixaban (ELIQUIS) 5 MG TABS tablet   atorvastatin (LIPITOR) 20 MG tablet   flecainide (TAMBOCOR) 50 MG tablet   metoprolol tartrate (LOPRESSOR) 25 MG tablet     Endocrine   Type 2 diabetes mellitus with hyperglycemia (HCC) - Primary   Relevant Medications   insulin aspart (NOVOLOG) 100  UNIT/ML injection   atorvastatin (LIPITOR) 20 MG tablet   Insulin Glargine (BASAGLAR KWIKPEN) 100 UNIT/ML   Insulin Pen Needle 29G X 5MM MISC   Other Relevant Orders   CBC with Differential/Platelet   Comp. Metabolic Panel (12)   Microalbumin / creatinine urine ratio   Hemoglobin A1c     Other   Pacemaker   Chronic anticoagulation    Meds ordered this encounter  Medications   insulin aspart (NOVOLOG) 100 UNIT/ML injection    Sig: Inject 5 Units into the skin 3 (three) times daily with meals.    Dispense:  20 mL    Refill:  1    Patient needs 90 day supply due to travel    Order Specific Question:   Supervising Provider    Answer:   Asencion Noble E [[id number]]   apixaban (ELIQUIS) 5 MG TABS tablet    Sig: Take 1 tablet (5 mg total) by mouth 2 (two) times daily.    Dispense:  180 tablet    Refill:  1    Order Specific Question:   Supervising Provider    Answer:   Asencion Noble E [[id number]]   atorvastatin (LIPITOR) 20 MG tablet    Sig: Take 1 tablet (20 mg total) by mouth daily.    Dispense:  90 tablet    Refill:  3    Order Specific Question:   Supervising Provider    Answer:   Elsie Stain [[id number]]   flecainide (TAMBOCOR) 50 MG tablet    Sig: Take 1 tablet (50 mg total) by mouth 2 (two) times daily.    Dispense:  180 tablet    Refill:  1    Order Specific Question:   Supervising Provider    Answer:   Elsie Stain [[id number]]   Insulin Glargine (BASAGLAR KWIKPEN) 100 UNIT/ML    Sig: Inject 30 Units into the skin at bedtime.    Dispense:  15 mL    Refill:  4    Order Specific Question:   Supervising Provider    Answer:   Asencion Noble E [[id number]]   Insulin Pen Needle 29G X 5MM MISC    Sig: Use with insulin pens    Dispense:  100 each    Refill:  3    Patient needs 90 day supple due to travel    Order Specific Question:   Supervising Provider    Answer:   Asencion Noble E [[id number]]   metoprolol tartrate (LOPRESSOR) 25 MG tablet    Sig: Take 0.5 tablets (12.5 mg total)  by mouth 2 (two) times daily.    Dispense:  90 tablet    Refill:  1    Order Specific Question:   Supervising Provider    Answer:   Joya Gaskins,  PATRICK E [[id number]]  1. Type 2 diabetes mellitus with hyperglycemia, with long-term current use of insulin (HCC) Unable to complete POCT A1c due to temperature, patient encouraged to continue current regimen, all medications were written down for patient.  Was able to speak with patient's granddaughter with patient's permission over the phone who was able to confirm medications that she has been taking.  Patient strongly encouraged to contact cardiology to inform them she will be traveling out of the country and also strongly encouraged to make sure she is maintaining proper follow-up with cardiology and her primary care provider.  Encouraged to follow-up with them immediately on her return or return to mobile medicine unit..  Patient, son and granddaughter all understand and agree.  Red flags given for prompt reevaluation. - insulin aspart (NOVOLOG) 100 UNIT/ML injection; Inject 5 Units into the skin 3 (three) times daily with meals.  Dispense: 20 mL; Refill: 1 - CBC with Differential/Platelet - Comp. Metabolic Panel (12) - Microalbumin / creatinine urine ratio - Insulin Glargine (BASAGLAR KWIKPEN) 100 UNIT/ML; Inject 30 Units into the skin at bedtime.  Dispense: 15 mL; Refill: 4 - Insulin Pen Needle 29G X 5MM MISC; Use with insulin pens  Dispense: 100 each; Refill: 3 - Hemoglobin A1c  2. Chronic atrial fibrillation (HCC) Continue current regimen - apixaban (ELIQUIS) 5 MG TABS tablet; Take 1 tablet (5 mg total) by mouth 2 (two) times daily.  Dispense: 180 tablet; Refill: 1 - atorvastatin (LIPITOR) 20 MG tablet; Take 1 tablet (20 mg total) by mouth daily.  Dispense: 90 tablet; Refill: 3 - flecainide (TAMBOCOR) 50 MG tablet; Take 1 tablet (50 mg total) by mouth 2 (two) times daily.  Dispense: 180 tablet; Refill: 1 - metoprolol tartrate (LOPRESSOR) 25 MG  tablet; Take 0.5 tablets (12.5 mg total) by mouth 2 (two) times daily.  Dispense: 90 tablet; Refill: 1  3. Chronic anticoagulation   4. Pacemaker    I have reviewed the patient's medical history (PMH, PSH, Social History, Family History, Medications, and allergies) , and have been updated if relevant. I spent 30 minutes reviewing chart and  face to face time with patient.     Follow-up: Return if symptoms worsen or fail to improve.    Loraine Grip Mayers, PA-C

## 2021-11-17 NOTE — Progress Notes (Signed)
Patient request refills for medications for 6 months travel Patient ate at 1 am and has not taken medicaiton. Patient denies pain at this time.

## 2021-11-18 ENCOUNTER — Telehealth: Payer: Self-pay | Admitting: *Deleted

## 2021-11-18 LAB — CBC WITH DIFFERENTIAL/PLATELET
Basophils Absolute: 0.1 10*3/uL (ref 0.0–0.2)
Basos: 1 %
EOS (ABSOLUTE): 0.2 10*3/uL (ref 0.0–0.4)
Eos: 2 %
Hematocrit: 40.7 % (ref 34.0–46.6)
Hemoglobin: 13 g/dL (ref 11.1–15.9)
Immature Grans (Abs): 0 10*3/uL (ref 0.0–0.1)
Immature Granulocytes: 0 %
Lymphocytes Absolute: 2.8 10*3/uL (ref 0.7–3.1)
Lymphs: 29 %
MCH: 30.4 pg (ref 26.6–33.0)
MCHC: 31.9 g/dL (ref 31.5–35.7)
MCV: 95 fL (ref 79–97)
Monocytes Absolute: 0.7 10*3/uL (ref 0.1–0.9)
Monocytes: 7 %
Neutrophils Absolute: 5.9 10*3/uL (ref 1.4–7.0)
Neutrophils: 61 %
Platelets: 321 10*3/uL (ref 150–450)
RBC: 4.28 x10E6/uL (ref 3.77–5.28)
RDW: 11.5 % — ABNORMAL LOW (ref 11.7–15.4)
WBC: 9.6 10*3/uL (ref 3.4–10.8)

## 2021-11-18 LAB — COMP. METABOLIC PANEL (12)
AST: 13 IU/L (ref 0–40)
Albumin/Globulin Ratio: 1.2 (ref 1.2–2.2)
Albumin: 4 g/dL (ref 3.7–4.7)
Alkaline Phosphatase: 123 IU/L — ABNORMAL HIGH (ref 44–121)
BUN/Creatinine Ratio: 24 (ref 12–28)
BUN: 18 mg/dL (ref 8–27)
Bilirubin Total: 0.4 mg/dL (ref 0.0–1.2)
Calcium: 9.6 mg/dL (ref 8.7–10.3)
Chloride: 99 mmol/L (ref 96–106)
Creatinine, Ser: 0.74 mg/dL (ref 0.57–1.00)
Globulin, Total: 3.4 g/dL (ref 1.5–4.5)
Glucose: 362 mg/dL — ABNORMAL HIGH (ref 70–99)
Potassium: 4.9 mmol/L (ref 3.5–5.2)
Sodium: 134 mmol/L (ref 134–144)
Total Protein: 7.4 g/dL (ref 6.0–8.5)
eGFR: 85 mL/min/{1.73_m2} (ref >59)

## 2021-11-18 LAB — MICROALBUMIN / CREATININE URINE RATIO
Creatinine, Urine: 33.2 mg/dL
Microalb/Creat Ratio: 498 mg/g creat — ABNORMAL HIGH (ref 0–29)
Microalbumin, Urine: 165.3 ug/mL

## 2021-11-18 LAB — HEMOGLOBIN A1C
Est. average glucose Bld gHb Est-mCnc: >398 mg/dL
Hgb A1c MFr Bld: >15.5 % — ABNORMAL HIGH (ref 4.8–5.6)

## 2021-11-18 MED ORDER — LISINOPRIL 5 MG PO TABS: 2.5000 mg | ORAL_TABLET | Freq: Every day | ORAL | 1 refills | Status: DC

## 2021-11-18 NOTE — Telephone Encounter (Signed)
Medical Assistant used [employer] to contact patient.  Interpreter Name: Inda Coke #: [id number] Patients granddaughter is aware of results and patient needing to continue current regimen. Patients granddaughter will mail lisinopril to patient to help protect kidneys while she is controlling her blood sugar levels.

## 2021-11-18 NOTE — Addendum Note (Signed)
Addended by: Roney Jaffe on: 11/18/2021 03:21 PM   Modules accepted: Orders

## 2021-11-18 NOTE — Telephone Encounter (Signed)
-----   Message from Roney Jaffe, [place] sent at 11/18/2021  3:21 PM EST ----- Please call patient and let her know that her A1c was greater than 15.5.  It is imperative that she take her insulin regimen as directed, check her blood glucose levels on a daily basis and keep all follow-up office appointments.  Her microalbumin creatinine level is worsening, she needs to start on a low-dose of lisinopril as well as work on better managing her blood glucose levels.

## 2022-04-08 NOTE — Progress Notes (Deleted)
Electrophysiology Office Note Date: 04/08/2022  ID:  Courtney [name], DOB Sep 22, 1946, MRN [id number]  PCP: Storm Frisk, MD Primary Cardiologist: Chilton Si, MD Electrophysiologist: Sherryl Manges, MD   CC: Pacemaker follow-up  Courtney [name] is a 76 y.o. female seen today for Sherryl Manges, MD for routine electrophysiology followup.  Since {Blank single:19197::"last being seen in our clinic","discharge from hospital"} the patient reports doing ***.  she denies chest pain, palpitations, dyspnea, PND, orthopnea, nausea, vomiting, dizziness, syncope, edema, weight gain, or early satiety.  Device History: SJM dual chamber PPM, implanted 09/05/2017 Leads are NOT MRI conditional  Past Medical History:  Diagnosis Date   Allergy    Atherosclerosis of aorta (HCC) 10/15/2019   Bacteremia due to Klebsiella pneumoniae, hypervirulent pathogen 10/13/2019   Diabetes mellitus type 2, diet-controlled (HCC)    PAF (paroxysmal atrial fibrillation) (HCC) 09/04/2017   Sinus bradycardia 09/04/2017   HR 30s at times   Tachy-brady syndrome (HCC) 09/04/2017   Past Surgical History:  Procedure Laterality Date   IR GUIDED DRAIN W CATHETER PLACEMENT  10/15/2019   IR PARACENTESIS  10/10/2019   IR RADIOLOGIST EVAL & MGMT  12/21/2019   IR US GUIDE BX ASP/DRAIN  10/10/2019   PACEMAKER IMPLANT N/A 09/05/2017   Procedure: Pacemaker Implant;  Surgeon: Duke Salvia, MD;  Location: [facility] INVASIVE CV LAB;  Service: Cardiovascular;  Laterality: N/A;   TEE WITHOUT CARDIOVERSION N/A 10/12/2019   Procedure: TRANSESOPHAGEAL ECHOCARDIOGRAM (TEE);  Surgeon: Jake Bathe, MD;  Location: [facility] ENDOSCOPY;  Service: Cardiovascular;  Laterality: N/A;    Current Outpatient Medications  Medication Sig Dispense Refill   apixaban (ELIQUIS) 5 MG TABS tablet Take 1 tablet (5 mg total) by mouth 2 (two) times daily. 180 tablet 1   atorvastatin (LIPITOR) 20 MG tablet Take 1 tablet (20 mg total) by mouth daily. 90 tablet 3   flecainide  (TAMBOCOR) 50 MG tablet Take 1 tablet (50 mg total) by mouth 2 (two) times daily. 180 tablet 1   insulin aspart (NOVOLOG) 100 UNIT/ML injection Inject 5 Units into the skin 3 (three) times daily with meals. 20 mL 1   Insulin Glargine (BASAGLAR KWIKPEN) 100 UNIT/ML Inject 30 Units into the skin at bedtime. 15 mL 4   Insulin Pen Needle 29G X MISC Use with insulin pens 100 each 3   lisinopril (ZESTRIL) 5 MG tablet Take 0.5 tablets (2.5 mg total) by mouth daily. 45 tablet 1   metoprolol tartrate (LOPRESSOR) 25 MG tablet Take 0.5 tablets (12.5 mg total) by mouth 2 (two) times daily. 90 tablet 1   No current facility-administered medications for this visit.    Allergies:   Patient has no known allergies.   Social History: Social History   Socioeconomic History   Marital status: Single    Spouse name: Not on file   Number of children: Not on file   Years of education: Not on file   Highest education level: Not on file  Occupational History   Not on file  Tobacco Use   Smoking status: Former   Smokeless tobacco: Never  Vaping Use   Vaping Use: Never used  Substance and Sexual Activity   Alcohol use: No   Drug use: No   Sexual activity: Not Currently  Other Topics Concern   Not on file  Social History Narrative   Not on file   Social Determinants of Health   Financial Resource Strain: Not on file  Food Insecurity: Not on file  Transportation Needs: Not on file  Physical Activity: Not on file  Stress: Not on file  Social Connections: Not on file  Intimate Partner Violence: Not on file    Family History: Family History  Problem Relation Age of Onset   Diabetes Son      Review of Systems: All other systems reviewed and are otherwise negative except as noted above.  Physical Exam: There were no vitals filed for this visit.   GEN- The patient is well appearing, alert and oriented x 3 today.   HEENT: normocephalic, atraumatic; sclera clear, conjunctiva pink; hearing  intact; oropharynx clear; neck supple  Lungs- Clear to ausculation bilaterally, normal work of breathing.  No wheezes, rales, rhonchi Heart- Regular rate and rhythm, no murmurs, rubs or gallops  GI- soft, non-tender, non-distended, bowel sounds present  Extremities- no clubbing or cyanosis. No edema MS- no significant deformity or atrophy Skin- warm and dry, no rash or lesion; PPM pocket well healed Psych- euthymic mood, full affect Neuro- strength and sensation are intact  PPM Interrogation- reviewed in detail today,  See PACEART report  EKG:  EKG is ordered today. Personal review of ekg ordered today shows ***   Recent Labs: 11/17/2021: BUN 18; Creatinine, Ser 0.74; Hemoglobin 13.0; Platelets 321; Potassium 4.9; Sodium 134   Wt Readings from Last 3 Encounters:  11/17/21 97 lb (44 kg)  05/15/21 108 lb 12.8 oz (49.4 kg)  05/08/21 101 lb (45.8 kg)     Other studies Reviewed: Additional studies/ records that were reviewed today include: Previous EP office notes, Previous remote checks, Most recent labwork.   Assessment and Plan:  1. Tachy-Brady syndrome s/p St. Jude PPM  Normal PPM function See Pace Art report No changes today   2. Paroxysmal Afib EKG today shows *** CHA2DS2Vasc is 3, on Eliquis, appropriately dosed Continue flecainide and metoprolol with stable intervals    3. h/o sepsis, bacteremia, Klebsiella Back in Oct 2020 Possible involvement of MV/AV, (TEE noted above), no clear involvement on leads     10/11/2019 BC x2 were both neg x5days   4. ? CP, DOE Unfortunately I think we are losing some details in translation Low risk stress test with no ischemia 04/2021 Suspect musculoskeletal based on limited history.  No SOB reported today  Current medicines are reviewed at length with the patient today.    Labs/ tests ordered today include: *** No orders of the defined types were placed in this encounter.    Disposition:   Follow up with {Blank  single:19197::"Dr. Allred","Dr. Amada Jupiter. Klein","Dr. Camnitz","Dr. Lambert","EP APP"} in {Blank single:19197::"2 weeks","4 weeks","3 months","6 months","12 months","as usual post gen change"}    Signed, Luane School  04/08/2022 9:49 AM  [facility] [address] [place] [address] [phone] (office) [phone] (fax)

## 2022-04-09 ENCOUNTER — Encounter: Payer: Medicare HMO | Admitting: Student

## 2022-04-09 DIAGNOSIS — I495 Sick sinus syndrome: Secondary | ICD-10-CM

## 2022-04-09 DIAGNOSIS — I480 Paroxysmal atrial fibrillation: Secondary | ICD-10-CM

## 2022-04-09 DIAGNOSIS — Z950 Presence of cardiac pacemaker: Secondary | ICD-10-CM

## 2022-04-09 DIAGNOSIS — R079 Chest pain, unspecified: Secondary | ICD-10-CM

## 2023-03-16 NOTE — Progress Notes (Unsigned)
  Electrophysiology Office Note:   Date:  03/17/2023  ID:  Courtney [name], DOB 1946/06/15, MRN [id number]  Primary Cardiologist: Skeet Latch, MD Electrophysiologist: Virl Axe, MD   History of Present Illness:   Courtney [name] is a 77 y.o. female with h/o DM2, tachy-brady s/p PPM, h/o AF, and h/o bacteremia with possible MV/AV involvement but no lead involvement (2020) who is seen today for routine electrophysiology followup. Since last being seen in our clinic the patient reports doing well overall. She sometimes gets some body pain when working in the back yard. Otherwise, denies exertional chest pain, palpitations, dyspnea, PND, orthopnea, nausea, vomiting, dizziness, syncope, edema, weight gain, or early satiety.   Review of systems complete and found to be negative unless listed in HPI.   Device information SJM dual chamber PPM, implanted 09/05/2017 Leads are NOT MRI conditional  Studies Reviewed:    PPM Interrogation-  reviewed in detail today,  See PACEART report.  EKG is ordered today. Personal review shows A pacing at 60 bpm, normal intervals  Risk Assessment/Calculations:          Physical Exam:   VS:  BP 120/84   Pulse 60   Ht 4\' 5"  (1.346 m)   Wt 112 lb (50.8 kg)   SpO2 97%   BMI 28.03 kg/m    Wt Readings from Last 3 Encounters:  03/17/23 112 lb (50.8 kg)  11/17/21 97 lb (44 kg)  05/15/21 108 lb 12.8 oz (49.4 kg)     GEN: Well nourished, well developed in no acute distress NECK: No JVD; No carotid bruits CARDIAC: Regular rate and rhythm, no murmurs, rubs, gallops RESPIRATORY:  Clear to auscultation without rales, wheezing or rhonchi  ABDOMEN: Soft, non-tender, non-distended EXTREMITIES:  No edema; No deformity   ASSESSMENT AND PLAN:    Tachy-Brady syndrome s/p Abbott PPM  Normal PPM function See Pace Art report No changes today  PAF EKg today shows NSR Continue flecainide 50 mg BID Continue eliquis for CHA2DS2VASc  of at least 3. Continue  metoprolol   Disposition:   Follow up with Dr. Caryl Comes in 6 months  Signed, Shirley Friar, PA-C

## 2023-03-17 ENCOUNTER — Ambulatory Visit: Payer: Medicare HMO | Attending: Student | Admitting: Student

## 2023-03-17 ENCOUNTER — Encounter: Payer: Self-pay | Admitting: Student

## 2023-03-17 VITALS — BP 120/84 | HR 60 | Ht <= 58 in | Wt 112.0 lb

## 2023-03-17 DIAGNOSIS — I482 Chronic atrial fibrillation, unspecified: Secondary | ICD-10-CM | POA: Diagnosis not present

## 2023-03-17 DIAGNOSIS — I495 Sick sinus syndrome: Secondary | ICD-10-CM

## 2023-03-17 DIAGNOSIS — Z950 Presence of cardiac pacemaker: Secondary | ICD-10-CM

## 2023-03-17 DIAGNOSIS — R809 Proteinuria, unspecified: Secondary | ICD-10-CM | POA: Diagnosis not present

## 2023-03-17 DIAGNOSIS — I480 Paroxysmal atrial fibrillation: Secondary | ICD-10-CM

## 2023-03-17 LAB — BASIC METABOLIC PANEL WITH GFR
BUN/Creatinine Ratio: 20 (ref 12–28)
BUN: 15 mg/dL (ref 8–27)
CO2: 24 mmol/L (ref 20–29)
Calcium: 9.6 mg/dL (ref 8.7–10.3)
Chloride: 102 mmol/L (ref 96–106)
Creatinine, Ser: 0.74 mg/dL (ref 0.57–1.00)
Glucose: 178 mg/dL — ABNORMAL HIGH (ref 70–99)
Potassium: 4.1 mmol/L (ref 3.5–5.2)
Sodium: 136 mmol/L (ref 134–144)
eGFR: 84 mL/min/1.73

## 2023-03-17 LAB — CBC
Hematocrit: 37.9 % (ref 34.0–46.6)
Hemoglobin: 12.5 g/dL (ref 11.1–15.9)
MCH: 30.6 pg (ref 26.6–33.0)
MCHC: 33 g/dL (ref 31.5–35.7)
MCV: 93 fL (ref 79–97)
Platelets: 363 x10E3/uL (ref 150–450)
RBC: 4.09 x10E6/uL (ref 3.77–5.28)
RDW: 13.8 % (ref 11.7–15.4)
WBC: 11.1 x10E3/uL — ABNORMAL HIGH (ref 3.4–10.8)

## 2023-03-17 MED ORDER — LISINOPRIL 5 MG PO TABS
2.5000 mg | ORAL_TABLET | Freq: Every day | ORAL | 1 refills | Status: DC
Start: 2023-03-17 — End: 2023-09-02

## 2023-03-17 MED ORDER — METOPROLOL TARTRATE 25 MG PO TABS
12.5000 mg | ORAL_TABLET | Freq: Two times a day (BID) | ORAL | 1 refills | Status: DC
Start: 2023-03-17 — End: 2023-09-07

## 2023-03-17 MED ORDER — APIXABAN 5 MG PO TABS
5.0000 mg | ORAL_TABLET | Freq: Two times a day (BID) | ORAL | 1 refills | Status: DC
Start: 2023-03-17 — End: 2024-05-08

## 2023-03-17 MED ORDER — FLECAINIDE ACETATE 50 MG PO TABS
50.0000 mg | ORAL_TABLET | Freq: Two times a day (BID) | ORAL | 1 refills | Status: DC
Start: 2023-03-17 — End: 2023-09-07

## 2023-03-17 NOTE — Patient Instructions (Signed)
Medication Instructions:  Your physician recommends that you continue on your current medications as directed. Please refer to the Current Medication list given to you today.  *If you need a refill on your cardiac medications before your next appointment, please call your pharmacy*  Lab Work: BMET, CBC--TODAY If you have labs (blood work) drawn today and your tests are completely normal, you will receive your results only by: [place] (if you have MyChart) OR A paper copy in the mail If you have any lab test that is abnormal or we need to change your treatment, we will call you to review the results.   Follow-Up: At [facility], you and your health needs are our priority.  As part of our continuing mission to provide you with exceptional heart care, we have created designated Provider Care Teams.  These Care Teams include your primary Cardiologist (physician) and Advanced Practice Providers (APPs -  Physician Assistants and Nurse Practitioners) who all work together to provide you with the care you need, when you need it.  We recommend signing up for the patient portal called "MyChart".  Sign up information is provided on this After Visit Summary.  MyChart is used to connect with patients for Virtual Visits (Telemedicine).  Patients are able to view lab/test results, encounter notes, upcoming appointments, etc.  Non-urgent messages can be sent to your provider as well.   To learn more about what you can do with MyChart, go to [email or web address].    Your next appointment:   6 month(s)  Provider:   Cristopher Peru, MD or Lollie Marrow, PA-C

## 2023-03-17 NOTE — Addendum Note (Signed)
Addended by: Juventino Slovak on: 03/17/2023 08:45 AM   Modules accepted: Orders

## 2023-08-24 ENCOUNTER — Encounter: Payer: Medicare HMO | Admitting: Critical Care Medicine

## 2023-08-24 NOTE — Progress Notes (Deleted)
Subjective:    Patient ID: Courtney [name], female    DOB: [birth year], 77 y.o.   MRN: [id number]  77 y.o.F [place] who is here to establish for primary care.  11/12/19 The patient was recently hospitalized between 24 October and fifth November for complex illness.  She had diabetes type 2 which was pre-existing and paroxysmal atrial fibrillation with tachybradycardia syndrome with previous pacemaker placement.  She presented with fever and confusion and found to have diabetic ketoacidosis and subsequently had bacteremia identified with Klebsiella and associated multifocal pneumonia along with liver abscess and valvular endocarditis.  Below is the discharge summary  Admit date: 10/06/2019 Discharge date: 10/18/2019   Admitted From: Home Disposition: Home   Recommendations for Outpatient Follow-up:  1. Follow up with PCP in 1 week  2. Outpatient follow-up with ID, IR and cardiology 3. Follow-up with IR for drain care 4. Follow up in ED if symptoms worsen or new appear     Home Health: RN Equipment/Devices: None   Discharge Condition: Stable CODE STATUS: Full Diet recommendation: Heart healthy/carb modified   Brief/Interim Summary: 77 year old female with history of diabetes mellitus type 2, paroxysmal A. fib and tachycardia/bradycardia syndrome status post PPM presented with fever, confusion.  On presentation, she was found to have DKA which was treated with insulin drip and subsequently changed to long-acting insulin.  During the hospitalization, she was found to have sepsis from Klebsiella bacteremia resulting in multifocal pneumonia, liver abscess along with valvular endocarditis.  She underwent ultrasound-guided paracentesis and ultrasound-guided liver abscess aspiration on 10/10/2019.  She subsequently underwent ultrasound guided drainage of hepatic abscess and drain placement by IR on 10/15/2019.  ID has recommended Rocephin till 11/26/2019.  CT surgery recommended continued antibiotic  treatment and no valve repair at this time.  She will be discharged home to complete intravenous antibiotics via PICC line.  Outpatient follow-up with ID/IR/cardiology.     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  She subsequently underwent ultrasound guided drainage of hepatic abscess and drain placement by IR on 10/15/2019.   -ID has recommended Rocephin till 11/26/2019 for a total of 6 weeks of antibiotics.  CT surgery recommended continued antibiotic treatment and no valve repair at this time. -EP recommended medical therapy and surveillance cultures after therapy completed rather than lead removal now. -Outpatient follow-up with ID -Patient has a PICC line in place. -WBC still elevated but improving to 17.8 today.    Outpatient follow-up. -Outpatient follow-up with GI to arrange for colonoscopy, given liver abscess -Sepsis has resolved -Discharge patient home today as patient has remained hemodynamically stable.   Acute metabolic encephalopathy -Most likely from sepsis.  Resolved   Transaminitis -Due to liver abscess.  Improving.  Outpatient follow-up -Hepatitis A antibody reactive, hep B core antibody reactive.  Hep B surface antibody is also positive indicating likely previous infection   Paroxysmal A. 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  Continue Eliquis and flecainide.  Metoprolol on hold; will restart at a lower dose of 12.5 mg twice a day.   DKA: Resolved Diabetes mellitus type 2, uncontrolled with hyperglycemia -A1c 16.4.  Continue carb modified diet.  Continue Lantus along with insulin aspart with meals.  Outpatient follow-up.   Anemia of chronic  disease--hemoglobin stable.   ESBL positive and urine culture: Probably a  colonizer as per ID.  Note the patient is on Rocephin and will continue for a 6-week total course.  She has follow-up visit with infectious disease.  There is a PICC line in the right arm in place.  Note she does have a home care nurse that assists her.  Note the patient does need testing supplies for her glucose meter.  She also was not able to achieve the insulin Lantus as it was not covered under her insurance.  She also is out of her pain medicine and this is been an issue and that she has significant pain in the liver from the liver abscess.  Also there is a drain still in place that is managed by interventional radiology.  The patient has upcoming appointments with gastroenterology cardiology and infectious disease.  Note she has lost weight and does not have much appetite.  The patient is here with her grand son who provides interpretive services  Wt Readings from Last 3 Encounters: 11/12/19 : 95 lb (43.1 kg) 10/12/19 : 105 lb 2.6 oz (47.7 kg) 09/13/18 : 97 lb (44 kg)  Note the patient does smoke 1 cigar daily  02/24/2021 This is a chronically ill-appearing [place] female accompanied by her son neither of whom speak [place].  Language barrier was assisted by Francee Nodal interpreter for [place]  The patient's not been seen in our clinic since January 2021 and when I review epic she is not been seen by any other providers either.  At the last visit she was recovering from Klebsiella bacteremia with endocarditis and liver abscess.  She had been followed by infectious disease and had a drainage catheter in her liver.  That has since been removed and she is off all antibiotics and infectious disease has signed off.  She is not been in to see her cardiologist either.  The assumption was there was oral location of her original infection.  She also had urinary source as well.  Since all of those acute illnesses  the patient has been at home and actually stopped taking all of her insulin.  She was still taking metoprolol and flecainide from cardiology.  She is not been in to see cardiology in nearly a year.  Note on arrival hemoglobin A1c was greater than 15 blood sugar was greater than 500  Patient has multiple somatic complaints including that of right upper quadrant abdominal pain, upper chest wall pain, shortness of breath, palpitations, excess urination.  Note the patient had been on Eliquis but she stopped this as well.  She uses a hookah type pipe system to smoke tobacco leaves through her [place] culture. 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 with findings on transesophageal echo  Continue intravenous Rocephin length of therapy per infectious disease according to orders it is to continue through December 17  Tachy-brady syndrome [facility]) Tachybradycardia syndrome controlled with flecainide and has permanent pacemaker  We will refill the flecainide as she is out of this medication  Liver abscess Liver abscess that did reaccumulate after paracentesis therefore now has a drain in place per interventional radiology  Follow-up per gastroenterology and infectious disease  Uncontrolled type 2 diabetes mellitus (HCC) Uncontrolled type 2 diabetes now requiring insulin therapy  Plan will be to obtain for the patient Basaglar insulin at a dose of 20 units daily which she has yet to receive and then to continue the NovoLog at 4 units 3 times daily with meals  Will obtain for the patient glucose  testing supplies repeat labs today including CBC and complete metabolic panel urine for microalbumin  Note foot exam was normal today  Bacteremia due to Klebsiella pneumoniae, hypervirulent pathogen Bacteremia secondary to Klebsiella  Plan for this will be to continue antibiotics per infectious disease  Chronic anticoagulation On chronic Eliquis and  have made refills available  Tobacco use disorder I counseled today with regards to tobacco use   [place] was seen today for hospitalization follow-up.  08/24/23   Past Medical History:  Diagnosis Date   Allergy    Atherosclerosis of aorta (HCC) 10/15/2019   Bacteremia due to Klebsiella pneumoniae, hypervirulent pathogen 10/13/2019   Diabetes mellitus type 2, diet-controlled (HCC)    PAF (paroxysmal atrial fibrillation) (HCC) 09/04/2017   Sinus bradycardia 09/04/2017   HR 30s at times   Tachy-brady syndrome (HCC) 09/04/2017     Family History  Problem Relation Age of Onset   Diabetes Son      Social History   Socioeconomic History   Marital status: Single    Spouse name: Not on file   Number of children: Not on file   Years of education: Not on file   Highest education level: Not on file  Occupational History   Not on file  Tobacco Use   Smoking status: Former   Smokeless tobacco: Never  Vaping Use   Vaping status: Never Used  Substance and Sexual Activity   Alcohol use: No   Drug use: No   Sexual activity: Not Currently  Other Topics Concern   Not on file  Social History Narrative   Not on file   Social Determinants of Health   Financial Resource Strain: Not on file  Food Insecurity: Not on file  Transportation Needs: Not on file  Physical Activity: Not on file  Stress: Not on file  Social Connections: Not on file  Intimate Partner Violence: Not on file     No Known Allergies   Outpatient Medications Prior to Visit  Medication Sig Dispense Refill   apixaban (ELIQUIS) 5 MG TABS tablet Take 1 tablet (5 mg total) by mouth 2 (two) times daily. 180 tablet 1   atorvastatin (LIPITOR) 20 MG tablet Take 1 tablet (20 mg total) by mouth daily. 90 tablet 3   flecainide (TAMBOCOR) 50 MG tablet Take 1 tablet (50 mg total) by mouth 2 (two) times daily. 180 tablet 1   insulin aspart (NOVOLOG) 100 UNIT/ML injection Inject 5 Units into the skin 3 (three) times daily  with meals. 20 mL 1   Insulin Glargine (BASAGLAR KWIKPEN) 100 UNIT/ML Inject 30 Units into the skin at bedtime. 15 mL 4   Insulin Pen Needle 29G X MISC Use with insulin pens 100 each 3   lisinopril (ZESTRIL) 5 MG tablet Take 0.5 tablets (2.5 mg total) by mouth daily. 45 tablet 1   metoprolol tartrate (LOPRESSOR) 25 MG tablet Take 0.5 tablets (12.5 mg total) by mouth 2 (two) times daily. 90 tablet 1   No facility-administered medications prior to visit.      Review of Systems  Constitutional:  Positive for fatigue and fever.  HENT: Negative.    Respiratory:  Positive for shortness of breath. Negative for cough.   Cardiovascular:  Positive for chest pain. Negative for leg swelling.  Gastrointestinal:  Positive for abdominal pain and constipation. Negative for blood in stool and diarrhea.  Endocrine: Positive for polyuria.  Genitourinary:  Positive for dysuria and flank pain. Negative for difficulty urinating.  Musculoskeletal:  Positive  for back pain.  Neurological:  Positive for dizziness.  Psychiatric/Behavioral: Negative.         Objective:   Physical Exam  There were no vitals filed for this visit.   Gen: Pleasant, thin in no distress,  normal affect  ENT: No lesions,  mouth clear,  oropharynx clear, no postnasal drip, tobacco stained teeth with periodontal disease Neck: No JVD, no TMG, no carotid bruits  Lungs: No use of accessory muscles, no dullness to percussion, clear without rales or rhonchi  Cardiovascular: RRR, heart sounds normal, no murmur or gallops, no peripheral edema, pacemaker felt in the left upper chest  Abdomen: soft  no HSM,  BS normal, right upper quadrant is tender but liver is not enlarged  Musculoskeletal: No deformities, no cyanosis or clubbing  Neuro: alert, non focal  Skin: Warm, no lesions or rashes Foot exam was normal      Latest Ref Rng & Units 03/17/2023   10:23 AM 11/17/2021    9:17 AM 02/24/2021   11:01 AM  BMP  Glucose 70 - 99  mg/dL 161  096  045   BUN 8 - 27 mg/dL 15  18  19    Creatinine 0.57 - 1.00 mg/dL 4.09  8.11  9.14   BUN/Creat Ratio 12 - 28 20  24  29    Sodium 134 - 144 mmol/L 136  134  131   Potassium 3.5 - 5.2 mmol/L 4.1  4.9  3.8   Chloride 96 - 106 mmol/L 102  99  95   CO2 20 - 29 mmol/L 24   19   Calcium 8.7 - 10.3 mg/dL 9.6  9.6  8.8       Latest Ref Rng & Units 11/17/2021    9:17 AM 02/24/2021   11:01 AM 11/20/2019   11:34 AM  Hepatic Function  Total Protein 6.0 - 8.5 g/dL 7.4  6.7  7.2   Albumin 3.7 - 4.7 g/dL 4.0  4.0    AST 0 - 40 IU/L 13  91  24   ALT 0 - 32 IU/L  106  17   Alk Phosphatase 44 - 121 IU/L 123  160    Total Bilirubin 0.0 - 1.2 mg/dL 0.4  0.3  0.3       Latest Ref Rng & Units 03/17/2023   10:23 AM 11/17/2021    9:17 AM 02/24/2021   11:01 AM  CBC  WBC 3.4 - 10.8 x10E3/uL 11.1  9.6  8.8   Hemoglobin 11.1 - 15.9 g/dL 78.2  95.6  21.3   Hematocrit 34.0 - 46.6 % 37.9  40.7  38.9   Platelets 150 - 450 x10E3/uL 363  321  276       Assessment & Plan:  I personally reviewed all images and lab data in the [facility] system as well as any outside material available during this office visit and agree with the  radiology impressions.   No problem-specific Assessment & Plan notes found for this encounter.   There are no diagnoses linked to this encounter. Multiple primary care vaccines given including Prevnar 13 Valent pneumococcal vaccine, and tetanus vaccine along with flu vaccine This visit lasted 75 minutes due to the fact this patient required multiple doses of insulin

## 2023-09-02 ENCOUNTER — Other Ambulatory Visit: Payer: Self-pay | Admitting: Student

## 2023-09-02 DIAGNOSIS — R809 Proteinuria, unspecified: Secondary | ICD-10-CM

## 2023-09-07 ENCOUNTER — Other Ambulatory Visit: Payer: Self-pay | Admitting: Student

## 2023-09-07 DIAGNOSIS — I482 Chronic atrial fibrillation, unspecified: Secondary | ICD-10-CM

## 2023-09-12 ENCOUNTER — Encounter: Payer: Medicare HMO | Admitting: Student

## 2023-09-14 ENCOUNTER — Ambulatory Visit: Payer: Medicare HMO | Attending: Student | Admitting: Student

## 2023-09-14 ENCOUNTER — Encounter: Payer: Self-pay | Admitting: Student

## 2023-09-14 VITALS — BP 134/80 | HR 62 | Ht <= 58 in | Wt 106.4 lb

## 2023-09-14 DIAGNOSIS — I495 Sick sinus syndrome: Secondary | ICD-10-CM | POA: Diagnosis not present

## 2023-09-14 DIAGNOSIS — I480 Paroxysmal atrial fibrillation: Secondary | ICD-10-CM | POA: Diagnosis not present

## 2023-09-14 LAB — CUP PACEART INCLINIC DEVICE CHECK
Battery Remaining Longevity: 56 mo
Battery Voltage: 2.96 V
Brady Statistic RA Percent Paced: 93 %
Brady Statistic RV Percent Paced: 0.47 %
Date Time Interrogation Session: 20241002110359
Implantable Lead Connection Status: 753985
Implantable Lead Connection Status: 753985
Implantable Lead Implant Date: 20180924
Implantable Lead Implant Date: 20180924
Implantable Lead Location: 753859
Implantable Lead Location: 753860
Implantable Lead Model: 1944
Implantable Lead Model: 1948
Implantable Pulse Generator Implant Date: 20180924
Lead Channel Impedance Value: 662.5 Ohm
Lead Channel Impedance Value: 800 Ohm
Lead Channel Pacing Threshold Amplitude: 0.5 V
Lead Channel Pacing Threshold Amplitude: 0.5 V
Lead Channel Pacing Threshold Amplitude: 0.75 V
Lead Channel Pacing Threshold Amplitude: 0.75 V
Lead Channel Pacing Threshold Pulse Width: 0.5 ms
Lead Channel Pacing Threshold Pulse Width: 0.5 ms
Lead Channel Pacing Threshold Pulse Width: 0.5 ms
Lead Channel Pacing Threshold Pulse Width: 0.5 ms
Lead Channel Sensing Intrinsic Amplitude: 12 mV
Lead Channel Sensing Intrinsic Amplitude: 3.7 mV
Lead Channel Setting Pacing Amplitude: 2 V
Lead Channel Setting Pacing Amplitude: 2.5 V
Lead Channel Setting Pacing Pulse Width: 0.5 ms
Lead Channel Setting Sensing Sensitivity: 2 mV
Pulse Gen Model: 2272
Pulse Gen Serial Number: 8940692

## 2023-09-14 NOTE — Progress Notes (Signed)
  Electrophysiology Office Note:   ID:  Courtney [name], Courtney [name] 1946-06-07, MRN [id number]  Primary Cardiologist: Chilton Si, MD Electrophysiologist: Sherryl Manges, MD      History of Present Illness:   Courtney [name] is a 77 y.o. female with h/o h/o DM2, tachy-brady s/p PPM, h/o AF, and h/o bacteremia with possible MV/AV involvement but no lead involvement (2020)  seen today for routine electrophysiology followup.   INTERPRETER PRESENT  Since last being seen in our clinic the patient reports doing very well.  she denies chest pain, palpitations, dyspnea, PND, orthopnea, nausea, vomiting, dizziness, syncope, edema, weight gain, or early satiety.   Review of systems complete and found to be negative unless listed in HPI.   EP Information / Studies Reviewed:    EKG is ordered today. Personal review as below.  EKG Interpretation Date/Time:  Wednesday September 14 2023 10:22:00 EDT Ventricular Rate:  62 PR Interval:  196 QRS Duration:  86 QT Interval:  468 QTC Calculation: 475 R Axis:   68  Text Interpretation: Atrial-paced rhythm Stable intervals overall, pacing noted. Confirmed by Maxine Glenn [phone]) on 09/14/2023 10:29:13 AM    PPM Interrogation-  reviewed in detail today,  See PACEART report.  Device History: SJM dual chamber PPM, implanted 09/05/2017 Leads are NOT MRI conditional  Physical Exam:   VS:  BP 134/80 (BP Location: Left Arm, Patient Position: Sitting, Cuff Size: Normal)   Pulse 62   Ht 4\' 5"  (1.346 m)   Wt 106 lb 6.4 oz (48.3 kg)   SpO2 99%   BMI 26.63 kg/m    Wt Readings from Last 3 Encounters:  09/14/23 106 lb 6.4 oz (48.3 kg)  03/17/23 112 lb (50.8 kg)  11/17/21 97 lb (44 kg)     GEN: Well nourished, well developed in no acute distress NECK: No JVD; No carotid bruits CARDIAC: Regular rate and rhythm, no murmurs, rubs, gallops RESPIRATORY:  Clear to auscultation without rales, wheezing or rhonchi  ABDOMEN: Soft, non-tender, non-distended EXTREMITIES:  No  edema; No deformity   ASSESSMENT AND PLAN:    Tachy-Brady syndrome s/p Abbott PPM  Normal PPM function See Pace Art report No changes today  Paroxysmal Atrial Fibrillation  EKG today shows NSR Continue Eliquis 5mg  BID for CHA2DS2VASC of at least 3 Continue Flecainide 50 mg BID Continue Lopressor 12.5 mg BID     International Travel Next year plans trip to [place].  Will need to work with her pharmacy and family here and there to make sure she can get her medicine.   Disposition:   Follow up with EP APP in 6 months  Signed, Graciella Freer, PA-C

## 2023-09-14 NOTE — Patient Instructions (Signed)
Medication Instructions:  Your physician recommends that you continue on your current medications as directed. Please refer to the Current Medication list given to you today.  *If you need a refill on your cardiac medications before your next appointment, please call your pharmacy*  Lab Work: None ordered If you have labs (blood work) drawn today and your tests are completely normal, you will receive your results only by: MyChart Message (if you have MyChart) OR A paper copy in the mail If you have any lab test that is abnormal or we need to change your treatment, we will call you to review the results.  Follow-Up: At [facility], you and your health needs are our priority.  As part of our continuing mission to provide you with exceptional heart care, we have created designated Provider Care Teams.  These Care Teams include your primary Cardiologist (physician) and Advanced Practice Providers (APPs -  Physician Assistants and Nurse Practitioners) who all work together to provide you with the care you need, when you need it.  We recommend signing up for the patient portal called "MyChart".  Sign up information is provided on this After Visit Summary.  MyChart is used to connect with patients for Virtual Visits (Telemedicine).  Patients are able to view lab/test results, encounter notes, upcoming appointments, etc.  Non-urgent messages can be sent to your provider as well.   To learn more about what you can do with MyChart, go to [email or web address].    Your next appointment:   6 month(s)  Provider:   Casimiro Needle "Otilio Saber, PA-C

## 2024-02-23 ENCOUNTER — Other Ambulatory Visit: Payer: Self-pay | Admitting: Cardiovascular Disease

## 2024-02-23 DIAGNOSIS — R809 Proteinuria, unspecified: Secondary | ICD-10-CM

## 2024-03-04 ENCOUNTER — Other Ambulatory Visit: Payer: Self-pay | Admitting: Cardiovascular Disease

## 2024-03-04 DIAGNOSIS — I482 Chronic atrial fibrillation, unspecified: Secondary | ICD-10-CM

## 2024-05-07 NOTE — Progress Notes (Unsigned)
  Electrophysiology Office Note:   ID:  Dennisse Hua Stejskal, DOB 12-07-1946, MRN [id number]  Primary Cardiologist: Maudine Sos, MD Electrophysiologist: Richardo Chandler, MD  {Click to update primary MD,subspecialty MD or APP then REFRESH:1}    History of Present Illness:   Courtney [name] is a 78 y.o. female with h/o DM2, tachy-brady s/p PPM, h/o AF, and h/o bacteremia with possible MV/AV involvement but no lead involvement (2020) seen today for routine electrophysiology followup.   Since last being seen in our clinic the patient reports doing ***.  she denies chest pain, palpitations, dyspnea, PND, orthopnea, nausea, vomiting, dizziness, syncope, edema, weight gain, or early satiety.   Review of systems complete and found to be negative unless listed in HPI.   EP Information / Studies Reviewed:    EKG is ordered today. Personal review as below.       PPM Interrogation-  reviewed in detail today,  See PACEART report.  Arrhythmia/Device History SJM dual chamber PPM, implanted 09/05/2017 Leads are NOT MRI conditional    Physical Exam:   VS:  There were no vitals taken for this visit.   Wt Readings from Last 3 Encounters:  09/14/23 106 lb 6.4 oz (48.3 kg)  03/17/23 112 lb (50.8 kg)  11/17/21 97 lb (44 kg)     GEN: No acute distress  NECK: No JVD; No carotid bruits CARDIAC: {EPRHYTHM:28826}, no murmurs, rubs, gallops RESPIRATORY:  Clear to auscultation without rales, wheezing or rhonchi  ABDOMEN: Soft, non-tender, non-distended EXTREMITIES:  {EDEMA LEVEL:28147::"No"} edema; No deformity   ASSESSMENT AND PLAN:    Tachy-Brady syndrome s/p Abbott PPM  Normal PPM function See Pace Art report No changes today  Paroxysmal AF EKG today shows *** on flecainide  Continue eliquis  5 mg BID for CHA2DS2VASc of at least 3 Continue flecainide  50 mg BID Continue lopressor  12.5 mg BID   {Click here to Review PMH, Prob List, Meds, Allergies, SHx, FHx  :1}   Disposition:   Follow up with  {EPPROVIDERS:28135} {EPFOLLOW UP:28173}  Signed, Tylene Galla, PA-C

## 2024-05-08 ENCOUNTER — Ambulatory Visit: Attending: Student | Admitting: Student

## 2024-05-08 ENCOUNTER — Encounter: Payer: Self-pay | Admitting: Student

## 2024-05-08 VITALS — BP 134/80 | HR 66 | Ht <= 58 in | Wt 107.4 lb

## 2024-05-08 DIAGNOSIS — I495 Sick sinus syndrome: Secondary | ICD-10-CM | POA: Diagnosis not present

## 2024-05-08 DIAGNOSIS — I482 Chronic atrial fibrillation, unspecified: Secondary | ICD-10-CM

## 2024-05-08 DIAGNOSIS — Z794 Long term (current) use of insulin: Secondary | ICD-10-CM | POA: Diagnosis not present

## 2024-05-08 DIAGNOSIS — I480 Paroxysmal atrial fibrillation: Secondary | ICD-10-CM | POA: Diagnosis not present

## 2024-05-08 DIAGNOSIS — Z950 Presence of cardiac pacemaker: Secondary | ICD-10-CM | POA: Diagnosis not present

## 2024-05-08 DIAGNOSIS — E1165 Type 2 diabetes mellitus with hyperglycemia: Secondary | ICD-10-CM | POA: Diagnosis not present

## 2024-05-08 LAB — CBC
Hematocrit: 34.3 % (ref 34.0–46.6)
Hemoglobin: 10.7 g/dL — ABNORMAL LOW (ref 11.1–15.9)
MCH: 30.7 pg (ref 26.6–33.0)
MCHC: 31.2 g/dL — ABNORMAL LOW (ref 31.5–35.7)
MCV: 99 fL — ABNORMAL HIGH (ref 79–97)
Platelets: 339 10*3/uL (ref 150–450)
RBC: 3.48 x10E6/uL — ABNORMAL LOW (ref 3.77–5.28)
RDW: 11.9 % (ref 11.7–15.4)
WBC: 10.8 10*3/uL (ref 3.4–10.8)

## 2024-05-08 LAB — CUP PACEART INCLINIC DEVICE CHECK
Battery Remaining Longevity: 50 mo
Battery Voltage: 2.95 V
Brady Statistic RA Percent Paced: 89 %
Brady Statistic RV Percent Paced: 0.14 %
Date Time Interrogation Session: 20250527092033
Implantable Lead Connection Status: 753985
Implantable Lead Connection Status: 753985
Implantable Lead Implant Date: 20180924
Implantable Lead Implant Date: 20180924
Implantable Lead Location: 753859
Implantable Lead Location: 753860
Implantable Lead Model: 1944
Implantable Lead Model: 1948
Implantable Pulse Generator Implant Date: 20180924
Lead Channel Impedance Value: 650 Ohm
Lead Channel Impedance Value: 837.5 Ohm
Lead Channel Pacing Threshold Amplitude: 0.5 V
Lead Channel Pacing Threshold Amplitude: 0.5 V
Lead Channel Pacing Threshold Amplitude: 1 V
Lead Channel Pacing Threshold Amplitude: 1 V
Lead Channel Pacing Threshold Pulse Width: 0.5 ms
Lead Channel Pacing Threshold Pulse Width: 0.5 ms
Lead Channel Pacing Threshold Pulse Width: 0.5 ms
Lead Channel Pacing Threshold Pulse Width: 0.5 ms
Lead Channel Sensing Intrinsic Amplitude: 12 mV
Lead Channel Sensing Intrinsic Amplitude: 3.9 mV
Lead Channel Setting Pacing Amplitude: 2 V
Lead Channel Setting Pacing Amplitude: 2.5 V
Lead Channel Setting Pacing Pulse Width: 0.5 ms
Lead Channel Setting Sensing Sensitivity: 2 mV
Pulse Gen Model: 2272
Pulse Gen Serial Number: 8940692

## 2024-05-08 MED ORDER — FLECAINIDE ACETATE 50 MG PO TABS: 50.0000 mg | ORAL_TABLET | Freq: Two times a day (BID) | ORAL | 3 refills | Status: AC

## 2024-05-08 MED ORDER — APIXABAN 5 MG PO TABS
5.0000 mg | ORAL_TABLET | Freq: Two times a day (BID) | ORAL | 3 refills | Status: AC
Start: 2024-05-08 — End: ?

## 2024-05-08 NOTE — Patient Instructions (Signed)
 Medication Instructions:  No medication changes today. *If you need a refill on your cardiac medications before your next appointment, please call your pharmacy*  Lab Work: BMET and CBC today If you have labs (blood work) drawn today and your tests are completely normal, you will receive your results only by: MyChart Message (if you have MyChart) OR A paper copy in the mail If you have any lab test that is abnormal or we need to change your treatment, we will call you to review the results.  Testing/Procedures: No testing ordered today  Follow-Up: At [facility], you and your health needs are our priority.  As part of our continuing mission to provide you with exceptional heart care, our providers are all part of one team.  This team includes your primary Cardiologist (physician) and Advanced Practice Providers or APPs (Physician Assistants and Nurse Practitioners) who all work together to provide you with the care you need, when you need it.  Your next appointment:   6 month(s)  Provider:   You may see Richardo Chandler, MD or one of the following Advanced Practice Providers on your designated Care Team:   Mertha Abrahams, Kennard Pea "Jonelle Neri" Canfield, PA-C Suzann Riddle, NP Creighton Doffing, NP    We recommend signing up for the patient portal called "MyChart".  Sign up information is provided on this After Visit Summary.  MyChart is used to connect with patients for Virtual Visits (Telemedicine).  Patients are able to view lab/test results, encounter notes, upcoming appointments, etc.  Non-urgent messages can be sent to your provider as well.   To learn more about what you can do with MyChart, go to [email or web address].

## 2024-05-09 ENCOUNTER — Ambulatory Visit: Payer: Self-pay | Admitting: Student

## 2024-05-09 LAB — BASIC METABOLIC PANEL WITH GFR
BUN/Creatinine Ratio: 26 (ref 12–28)
BUN: 34 mg/dL — ABNORMAL HIGH (ref 8–27)
CO2: 20 mmol/L (ref 20–29)
Calcium: 9.3 mg/dL (ref 8.7–10.3)
Chloride: 103 mmol/L (ref 96–106)
Creatinine, Ser: 1.33 mg/dL — ABNORMAL HIGH (ref 0.57–1.00)
Glucose: 284 mg/dL — ABNORMAL HIGH (ref 70–99)
Potassium: 4.3 mmol/L (ref 3.5–5.2)
Sodium: 140 mmol/L (ref 134–144)
eGFR: 41 mL/min/{1.73_m2} — ABNORMAL LOW (ref >59)

## 2024-08-29 ENCOUNTER — Other Ambulatory Visit: Payer: Self-pay | Admitting: Cardiovascular Disease

## 2024-08-29 DIAGNOSIS — I482 Chronic atrial fibrillation, unspecified: Secondary | ICD-10-CM

## 2024-09-13 NOTE — Progress Notes (Unsigned)
  Electrophysiology Office Note:   ID:  Meribeth Hua Dimitroff, DOB 10/13/1946, MRN [id number]  Primary Cardiologist: Annabella Scarce, MD Electrophysiologist: Elspeth Sage, MD  {Click to update primary MD,subspecialty MD or APP then REFRESH:1}    History of Present Illness:   Courtney [name] is a 78 y.o. female with h/o  DM2, tachy-brady s/p PPM, h/o AF, and h/o bacteremia with possible MV/AV involvement but no lead involvement (2020) seen today for routine electrophysiology followup.   Since last being seen in our clinic the patient reports doing ***.  she denies chest pain, palpitations, dyspnea, PND, orthopnea, nausea, vomiting, dizziness, syncope, edema, weight gain, or early satiety.   Review of systems complete and found to be negative unless listed in HPI.   EP Information / Studies Reviewed:    EKG is ordered today. Personal review as below.       PPM Interrogation-  reviewed in detail today,  See PACEART report.  Arrhythmia/Device History SJM dual chamber PPM, implanted 09/05/2017 Leads are NOT MRI conditional    Physical Exam:   VS:  There were no vitals taken for this visit.   Wt Readings from Last 3 Encounters:  05/08/24 107 lb 6.4 oz (48.7 kg)  09/14/23 106 lb 6.4 oz (48.3 kg)  03/17/23 112 lb (50.8 kg)     GEN: No acute distress  NECK: No JVD; No carotid bruits CARDIAC: {EPRHYTHM:28826}, no murmurs, rubs, gallops RESPIRATORY:  Clear to auscultation without rales, wheezing or rhonchi  ABDOMEN: Soft, non-tender, non-distended EXTREMITIES:  {EDEMA LEVEL:28147::No} edema; No deformity   ASSESSMENT AND PLAN:    Tachy-Brady syndrome s/p Abbott PPM  Normal PPM function See Pace Art report No changes today  Paroxysmal AF EKG today shows *** Continue eliquis  5 mg BID for CHA2DS2VASc of at least 3 Continue flecainide  50 mg BID Continue lopressor  12.5 mg BID   DM2 Per PCP   {Click here to Review PMH, Prob List, Meds, Allergies, SHx, FHx  :1}   Disposition:   Follow up  with {EPPROVIDERS:28135::EP Team} {EPFOLLOW UP:28173}  Signed, Ozell Prentice Passey, PA-C

## 2024-09-14 ENCOUNTER — Encounter: Payer: Self-pay | Admitting: Student

## 2024-09-14 ENCOUNTER — Ambulatory Visit: Attending: Student | Admitting: Student

## 2024-09-14 VITALS — BP 158/82 | HR 60 | Ht <= 58 in | Wt 109.2 lb

## 2024-09-14 DIAGNOSIS — E1165 Type 2 diabetes mellitus with hyperglycemia: Secondary | ICD-10-CM | POA: Diagnosis not present

## 2024-09-14 DIAGNOSIS — I482 Chronic atrial fibrillation, unspecified: Secondary | ICD-10-CM | POA: Diagnosis not present

## 2024-09-14 DIAGNOSIS — Z794 Long term (current) use of insulin: Secondary | ICD-10-CM

## 2024-09-14 DIAGNOSIS — I495 Sick sinus syndrome: Secondary | ICD-10-CM | POA: Diagnosis not present

## 2024-09-14 LAB — CUP PACEART INCLINIC DEVICE CHECK
Battery Remaining Longevity: 45 mo
Battery Voltage: 2.93 V
Brady Statistic RA Percent Paced: 95 %
Brady Statistic RV Percent Paced: 0.16 %
Date Time Interrogation Session: 20251003090536
Implantable Lead Connection Status: 753985
Implantable Lead Connection Status: 753985
Implantable Lead Implant Date: 20180924
Implantable Lead Implant Date: 20180924
Implantable Lead Location: 753859
Implantable Lead Location: 753860
Implantable Lead Model: 1944
Implantable Lead Model: 1948
Implantable Pulse Generator Implant Date: 20180924
Lead Channel Impedance Value: 650 Ohm
Lead Channel Impedance Value: 800 Ohm
Lead Channel Pacing Threshold Amplitude: 0.5 V
Lead Channel Pacing Threshold Amplitude: 0.5 V
Lead Channel Pacing Threshold Amplitude: 1.25 V
Lead Channel Pacing Threshold Amplitude: 1.25 V
Lead Channel Pacing Threshold Pulse Width: 0.5 ms
Lead Channel Pacing Threshold Pulse Width: 0.5 ms
Lead Channel Pacing Threshold Pulse Width: 0.5 ms
Lead Channel Pacing Threshold Pulse Width: 0.5 ms
Lead Channel Sensing Intrinsic Amplitude: 12 mV
Lead Channel Sensing Intrinsic Amplitude: 3.5 mV
Lead Channel Setting Pacing Amplitude: 2 V
Lead Channel Setting Pacing Amplitude: 2.5 V
Lead Channel Setting Pacing Pulse Width: 0.5 ms
Lead Channel Setting Sensing Sensitivity: 2 mV
Pulse Gen Model: 2272
Pulse Gen Serial Number: 8940692

## 2024-09-14 NOTE — Patient Instructions (Signed)
 Medication Instructions:  Your physician recommends that you continue on your current medications as directed. Please refer to the Current Medication list given to you today.  *If you need a refill on your cardiac medications before your next appointment, please call your pharmacy*  Lab Work: None ordered If you have labs (blood work) drawn today and your tests are completely normal, you will receive your results only by: MyChart Message (if you have MyChart) OR A paper copy in the mail If you have any lab test that is abnormal or we need to change your treatment, we will call you to review the results.  Follow-Up: At [facility], you and your health needs are our priority.  As part of our continuing mission to provide you with exceptional heart care, our providers are all part of one team.  This team includes your primary Cardiologist (physician) and Advanced Practice Providers or APPs (Physician Assistants and Nurse Practitioners) who all work together to provide you with the care you need, when you need it.  Your next appointment:   March 2026  Provider:   Ozell Jodie Passey, PA-C    We recommend signing up for the patient portal called MyChart.  Sign up information is provided on this After Visit Summary.  MyChart is used to connect with patients for Virtual Visits (Telemedicine).  Patients are able to view lab/test results, encounter notes, upcoming appointments, etc.  Non-urgent messages can be sent to your provider as well.   To learn more about what you can do with MyChart, go to [email or web address].

## 2024-09-17 ENCOUNTER — Other Ambulatory Visit: Payer: Self-pay | Admitting: Cardiovascular Disease

## 2024-09-17 ENCOUNTER — Ambulatory Visit: Payer: Self-pay | Admitting: Cardiology

## 2024-09-17 DIAGNOSIS — R809 Proteinuria, unspecified: Secondary | ICD-10-CM

## 2024-09-19 NOTE — Telephone Encounter (Signed)
 Pt last seen by Jodie Passey PA. These RX's were originally Prescribed by Jodie Passey PA. Pt has never seen Dr. Raford as a Gen Card. Does Jodie Passey PA want to refill? Please advise.

## 2024-09-20 NOTE — Telephone Encounter (Signed)
 Fine to refill

## 2024-09-24 ENCOUNTER — Ambulatory Visit: Admitting: Student
